# Patient Record
Sex: Female | Born: 1950
Health system: Southern US, Community
[De-identification: ages and names within clinical notes are randomized; demographics above are authoritative.]

## PROBLEM LIST (undated history)

## (undated) DIAGNOSIS — R053 Chronic cough: Secondary | ICD-10-CM

## (undated) DIAGNOSIS — F419 Anxiety disorder, unspecified: Secondary | ICD-10-CM

## (undated) DIAGNOSIS — IMO0001 Reserved for inherently not codable concepts without codable children: Secondary | ICD-10-CM

## (undated) DIAGNOSIS — R609 Edema, unspecified: Secondary | ICD-10-CM

## (undated) DIAGNOSIS — R260 Ataxic gait: Secondary | ICD-10-CM

## (undated) DIAGNOSIS — R0609 Other forms of dyspnea: Secondary | ICD-10-CM

## (undated) DIAGNOSIS — K219 Gastro-esophageal reflux disease without esophagitis: Secondary | ICD-10-CM

## (undated) DIAGNOSIS — K551 Chronic vascular disorders of intestine: Secondary | ICD-10-CM

## (undated) DIAGNOSIS — I480 Paroxysmal atrial fibrillation: Secondary | ICD-10-CM

## (undated) DIAGNOSIS — M5030 Other cervical disc degeneration, unspecified cervical region: Secondary | ICD-10-CM

## (undated) DIAGNOSIS — I70219 Atherosclerosis of native arteries of extremities with intermittent claudication, unspecified extremity: Secondary | ICD-10-CM

## (undated) DIAGNOSIS — J449 Chronic obstructive pulmonary disease, unspecified: Secondary | ICD-10-CM

## (undated) DIAGNOSIS — I639 Cerebral infarction, unspecified: Secondary | ICD-10-CM

## (undated) DIAGNOSIS — I2510 Atherosclerotic heart disease of native coronary artery without angina pectoris: Secondary | ICD-10-CM

## (undated) DIAGNOSIS — I10 Essential (primary) hypertension: Secondary | ICD-10-CM

## (undated) DIAGNOSIS — I700 Atherosclerosis of aorta: Secondary | ICD-10-CM

## (undated) DIAGNOSIS — G4733 Obstructive sleep apnea (adult) (pediatric): Secondary | ICD-10-CM

## (undated) DIAGNOSIS — R600 Localized edema: Secondary | ICD-10-CM

## (undated) DIAGNOSIS — G4730 Sleep apnea, unspecified: Secondary | ICD-10-CM

## (undated) DIAGNOSIS — R05 Cough: Secondary | ICD-10-CM

## (undated) DIAGNOSIS — E785 Hyperlipidemia, unspecified: Secondary | ICD-10-CM

## (undated) DIAGNOSIS — R059 Cough, unspecified: Secondary | ICD-10-CM

## (undated) DIAGNOSIS — E119 Type 2 diabetes mellitus without complications: Secondary | ICD-10-CM

## (undated) DIAGNOSIS — M1990 Unspecified osteoarthritis, unspecified site: Secondary | ICD-10-CM

## (undated) DIAGNOSIS — G629 Polyneuropathy, unspecified: Secondary | ICD-10-CM

## (undated) DIAGNOSIS — J45909 Unspecified asthma, uncomplicated: Secondary | ICD-10-CM

## (undated) DIAGNOSIS — Z9842 Cataract extraction status, left eye: Secondary | ICD-10-CM

## (undated) DIAGNOSIS — E559 Vitamin D deficiency, unspecified: Secondary | ICD-10-CM

## (undated) DIAGNOSIS — R062 Wheezing: Secondary | ICD-10-CM

## (undated) DIAGNOSIS — R29898 Other symptoms and signs involving the musculoskeletal system: Secondary | ICD-10-CM

## (undated) DIAGNOSIS — Z7902 Long term (current) use of antithrombotics/antiplatelets: Secondary | ICD-10-CM

## (undated) HISTORY — PX: INCONTINENCE SURGERY: SHX676

## (undated) HISTORY — DX: Sleep apnea, unspecified: G47.30

## (undated) HISTORY — PX: ELBOW SURGERY: SHX618

## (undated) HISTORY — DX: Essential (primary) hypertension: I10

## (undated) HISTORY — PX: ABDOMINAL HYSTERECTOMY: SHX81

## (undated) HISTORY — PX: TONSILLECTOMY: SUR1361

## (undated) HISTORY — DX: Hyperlipidemia, unspecified: E78.5

## (undated) HISTORY — PX: CARPAL TUNNEL RELEASE: SHX101

## (undated) HISTORY — DX: Cerebral infarction, unspecified: I63.9

## (undated) HISTORY — PX: ANTERIOR FUSION CERVICAL SPINE: SUR626

---

## 1998-12-30 ENCOUNTER — Encounter: Payer: Self-pay | Admitting: Neurosurgery

## 1998-12-30 ENCOUNTER — Ambulatory Visit (HOSPITAL_COMMUNITY): Admission: RE | Admit: 1998-12-30 | Discharge: 1998-12-30 | Payer: Self-pay | Admitting: Neurosurgery

## 1999-01-04 ENCOUNTER — Ambulatory Visit: Admission: RE | Admit: 1999-01-04 | Discharge: 1999-01-04 | Payer: Self-pay | Admitting: Anesthesiology

## 1999-01-31 ENCOUNTER — Encounter: Payer: Self-pay | Admitting: Neurosurgery

## 1999-02-02 ENCOUNTER — Observation Stay (HOSPITAL_COMMUNITY): Admission: RE | Admit: 1999-02-02 | Discharge: 1999-02-02 | Payer: Self-pay | Admitting: Neurosurgery

## 1999-02-02 ENCOUNTER — Encounter: Payer: Self-pay | Admitting: Neurosurgery

## 1999-03-03 ENCOUNTER — Encounter: Payer: Self-pay | Admitting: Neurosurgery

## 1999-03-03 ENCOUNTER — Ambulatory Visit (HOSPITAL_COMMUNITY): Admission: RE | Admit: 1999-03-03 | Discharge: 1999-03-03 | Payer: Self-pay | Admitting: Neurosurgery

## 1999-03-27 ENCOUNTER — Encounter: Payer: Self-pay | Admitting: Neurosurgery

## 1999-03-27 ENCOUNTER — Ambulatory Visit (HOSPITAL_COMMUNITY): Admission: RE | Admit: 1999-03-27 | Discharge: 1999-03-27 | Payer: Self-pay | Admitting: Neurosurgery

## 2006-05-22 ENCOUNTER — Ambulatory Visit: Payer: Self-pay | Admitting: Orthopedic Surgery

## 2007-11-06 LAB — HM COLONOSCOPY: HM Colonoscopy: NORMAL

## 2008-06-01 ENCOUNTER — Ambulatory Visit: Payer: Self-pay | Admitting: Gastroenterology

## 2008-07-16 ENCOUNTER — Ambulatory Visit: Payer: Self-pay | Admitting: Unknown Physician Specialty

## 2009-10-25 ENCOUNTER — Ambulatory Visit: Payer: Self-pay | Admitting: Family Medicine

## 2010-02-03 DIAGNOSIS — I639 Cerebral infarction, unspecified: Secondary | ICD-10-CM

## 2010-02-03 HISTORY — DX: Cerebral infarction, unspecified: I63.9

## 2010-02-08 ENCOUNTER — Inpatient Hospital Stay: Payer: Self-pay | Admitting: Internal Medicine

## 2010-02-09 DIAGNOSIS — I6381 Other cerebral infarction due to occlusion or stenosis of small artery: Secondary | ICD-10-CM

## 2010-02-09 HISTORY — DX: Other cerebral infarction due to occlusion or stenosis of small artery: I63.81

## 2011-05-31 ENCOUNTER — Ambulatory Visit: Payer: Self-pay | Admitting: Podiatry

## 2011-06-19 ENCOUNTER — Encounter: Payer: Self-pay | Admitting: Internal Medicine

## 2011-11-08 ENCOUNTER — Ambulatory Visit: Payer: Self-pay | Admitting: Family Medicine

## 2015-06-20 ENCOUNTER — Ambulatory Visit: Payer: Self-pay | Admitting: Family

## 2015-11-06 DIAGNOSIS — Z9842 Cataract extraction status, left eye: Secondary | ICD-10-CM

## 2015-11-06 DIAGNOSIS — Z9841 Cataract extraction status, right eye: Secondary | ICD-10-CM

## 2015-11-06 HISTORY — DX: Cataract extraction status, right eye: Z98.42

## 2015-11-06 HISTORY — DX: Cataract extraction status, right eye: Z98.41

## 2015-11-07 ENCOUNTER — Emergency Department: Payer: BLUE CROSS/BLUE SHIELD

## 2015-11-07 ENCOUNTER — Inpatient Hospital Stay
Admission: EM | Admit: 2015-11-07 | Discharge: 2015-11-08 | DRG: 065 | Disposition: A | Discharge status: Home / Self Care | Payer: BLUE CROSS/BLUE SHIELD | Attending: Internal Medicine | Admitting: Internal Medicine

## 2015-11-07 ENCOUNTER — Inpatient Hospital Stay: Payer: BLUE CROSS/BLUE SHIELD

## 2015-11-07 ENCOUNTER — Encounter: Payer: Self-pay | Admitting: Emergency Medicine

## 2015-11-07 ENCOUNTER — Inpatient Hospital Stay (HOSPITAL_COMMUNITY)
Admit: 2015-11-07 | Discharge: 2015-11-07 | Disposition: A | Discharge status: Home / Self Care | Payer: BLUE CROSS/BLUE SHIELD | Attending: Internal Medicine | Admitting: Internal Medicine

## 2015-11-07 DIAGNOSIS — Z8249 Family history of ischemic heart disease and other diseases of the circulatory system: Secondary | ICD-10-CM

## 2015-11-07 DIAGNOSIS — Z8673 Personal history of transient ischemic attack (TIA), and cerebral infarction without residual deficits: Secondary | ICD-10-CM | POA: Diagnosis not present

## 2015-11-07 DIAGNOSIS — I10 Essential (primary) hypertension: Secondary | ICD-10-CM | POA: Diagnosis present

## 2015-11-07 DIAGNOSIS — E785 Hyperlipidemia, unspecified: Secondary | ICD-10-CM | POA: Diagnosis present

## 2015-11-07 DIAGNOSIS — I779 Disorder of arteries and arterioles, unspecified: Secondary | ICD-10-CM

## 2015-11-07 DIAGNOSIS — I639 Cerebral infarction, unspecified: Principal | ICD-10-CM | POA: Diagnosis present

## 2015-11-07 DIAGNOSIS — G4730 Sleep apnea, unspecified: Secondary | ICD-10-CM | POA: Diagnosis present

## 2015-11-07 DIAGNOSIS — Z9114 Patient's other noncompliance with medication regimen: Secondary | ICD-10-CM

## 2015-11-07 DIAGNOSIS — G8191 Hemiplegia, unspecified affecting right dominant side: Secondary | ICD-10-CM | POA: Diagnosis present

## 2015-11-07 DIAGNOSIS — I6330 Cerebral infarction due to thrombosis of unspecified cerebral artery: Secondary | ICD-10-CM

## 2015-11-07 DIAGNOSIS — F419 Anxiety disorder, unspecified: Secondary | ICD-10-CM | POA: Diagnosis present

## 2015-11-07 DIAGNOSIS — Z833 Family history of diabetes mellitus: Secondary | ICD-10-CM | POA: Diagnosis not present

## 2015-11-07 DIAGNOSIS — F17210 Nicotine dependence, cigarettes, uncomplicated: Secondary | ICD-10-CM | POA: Diagnosis present

## 2015-11-07 DIAGNOSIS — E119 Type 2 diabetes mellitus without complications: Secondary | ICD-10-CM | POA: Diagnosis present

## 2015-11-07 DIAGNOSIS — Z716 Tobacco abuse counseling: Secondary | ICD-10-CM

## 2015-11-07 DIAGNOSIS — I6350 Cerebral infarction due to unspecified occlusion or stenosis of unspecified cerebral artery: Secondary | ICD-10-CM | POA: Diagnosis not present

## 2015-11-07 DIAGNOSIS — R531 Weakness: Secondary | ICD-10-CM

## 2015-11-07 HISTORY — DX: Disorder of arteries and arterioles, unspecified: I77.9

## 2015-11-07 HISTORY — DX: Cerebral infarction, unspecified: I63.9

## 2015-11-07 LAB — APTT: APTT: 26 s (ref 24–36)

## 2015-11-07 LAB — CBC
HEMATOCRIT: 40.9 % (ref 35.0–47.0)
HEMOGLOBIN: 14 g/dL (ref 12.0–16.0)
MCH: 31.4 pg (ref 26.0–34.0)
MCHC: 34.4 g/dL (ref 32.0–36.0)
MCV: 91.2 fL (ref 80.0–100.0)
Platelets: 232 10*3/uL (ref 150–440)
RBC: 4.48 MIL/uL (ref 3.80–5.20)
RDW: 14.7 % — ABNORMAL HIGH (ref 11.5–14.5)
WBC: 10.1 10*3/uL (ref 3.6–11.0)

## 2015-11-07 LAB — DIFFERENTIAL
BASOS ABS: 0.1 10*3/uL (ref 0–0.1)
Basophils Relative: 1 %
EOS ABS: 0.1 10*3/uL (ref 0–0.7)
Eosinophils Relative: 1 %
LYMPHS ABS: 3 10*3/uL (ref 1.0–3.6)
LYMPHS PCT: 30 %
MONOS PCT: 6 %
Monocytes Absolute: 0.6 10*3/uL (ref 0.2–0.9)
NEUTROS ABS: 6.2 10*3/uL (ref 1.4–6.5)
NEUTROS PCT: 62 %

## 2015-11-07 LAB — COMPREHENSIVE METABOLIC PANEL
ALBUMIN: 4 g/dL (ref 3.5–5.0)
ALK PHOS: 118 U/L (ref 38–126)
ALT: 15 U/L (ref 14–54)
AST: 15 U/L (ref 15–41)
Anion gap: 8 (ref 5–15)
BILIRUBIN TOTAL: 0.5 mg/dL (ref 0.3–1.2)
BUN: 11 mg/dL (ref 6–20)
CALCIUM: 9.8 mg/dL (ref 8.9–10.3)
CO2: 28 mmol/L (ref 22–32)
CREATININE: 0.73 mg/dL (ref 0.44–1.00)
Chloride: 102 mmol/L (ref 101–111)
GFR calc Af Amer: >60 mL/min (ref >60)
GLUCOSE: 142 mg/dL — AB (ref 65–99)
Potassium: 4.1 mmol/L (ref 3.5–5.1)
Sodium: 138 mmol/L (ref 135–145)
TOTAL PROTEIN: 7.6 g/dL (ref 6.5–8.1)

## 2015-11-07 LAB — TROPONIN I: TROPONIN I: 0.03 ng/mL (ref <0.031)

## 2015-11-07 LAB — GLUCOSE, CAPILLARY
GLUCOSE-CAPILLARY: 134 mg/dL — AB (ref 65–99)
Glucose-Capillary: 160 mg/dL — ABNORMAL HIGH (ref 65–99)
Glucose-Capillary: 139 mg/dL — ABNORMAL HIGH (ref 65–99)

## 2015-11-07 LAB — HEMOGLOBIN A1C: Hgb A1c MFr Bld: 7.3 % — ABNORMAL HIGH (ref 4.0–6.0)

## 2015-11-07 LAB — PROTIME-INR
INR: 0.89
Prothrombin Time: 12.3 seconds (ref 11.4–15.0)

## 2015-11-07 MED ORDER — ACETAMINOPHEN 325 MG PO TABS: 650.0000 mg | ORAL_TABLET | Freq: Four times a day (QID) | ORAL | Status: DC | PRN

## 2015-11-07 MED ORDER — VITAMIN B-12 1000 MCG PO TABS
1000.0000 ug | ORAL_TABLET | Freq: Every day | ORAL | Status: DC
  Administered 2015-11-08: 1000 ug via ORAL
  Filled 2015-11-07: qty 1

## 2015-11-07 MED ORDER — ENOXAPARIN SODIUM 40 MG/0.4ML ~~LOC~~ SOLN
40.0000 mg | SUBCUTANEOUS | Status: DC
  Administered 2015-11-07: 40 mg via SUBCUTANEOUS
  Filled 2015-11-07: qty 0.4

## 2015-11-07 MED ORDER — ACETAMINOPHEN 650 MG RE SUPP
650.0000 mg | Freq: Four times a day (QID) | RECTAL | Status: DC | PRN
Start: 2015-11-07 — End: 2015-11-08

## 2015-11-07 MED ORDER — AMITRIPTYLINE HCL 50 MG PO TABS
150.0000 mg | ORAL_TABLET | Freq: Every day | ORAL | Status: DC
  Administered 2015-11-07: 21:00:00 150 mg via ORAL
  Filled 2015-11-07: qty 3

## 2015-11-07 MED ORDER — INSULIN ASPART 100 UNIT/ML ~~LOC~~ SOLN: 0.0000 [IU] | Freq: Every day | SUBCUTANEOUS | Status: DC

## 2015-11-07 MED ORDER — ATORVASTATIN CALCIUM 20 MG PO TABS
40.0000 mg | ORAL_TABLET | Freq: Every day | ORAL | Status: DC
  Administered 2015-11-07: 18:00:00 40 mg via ORAL
  Filled 2015-11-07: qty 2

## 2015-11-07 MED ORDER — ALBUTEROL SULFATE (2.5 MG/3ML) 0.083% IN NEBU: 2.5000 mg | INHALATION_SOLUTION | Freq: Four times a day (QID) | RESPIRATORY_TRACT | Status: DC | PRN

## 2015-11-07 MED ORDER — NICOTINE 21 MG/24HR TD PT24
21.0000 mg | MEDICATED_PATCH | Freq: Every day | TRANSDERMAL | Status: DC
  Administered 2015-11-07 – 2015-11-08 (×2): 21 mg via TRANSDERMAL
  Filled 2015-11-07 (×2): qty 1

## 2015-11-07 MED ORDER — ASPIRIN EC 81 MG PO TBEC
81.0000 mg | DELAYED_RELEASE_TABLET | Freq: Every day | ORAL | Status: DC
  Administered 2015-11-08: 09:00:00 81 mg via ORAL
  Filled 2015-11-07: qty 1

## 2015-11-07 MED ORDER — SODIUM CHLORIDE 0.9 % IJ SOLN
3.0000 mL | Freq: Two times a day (BID) | INTRAMUSCULAR | Status: DC
  Administered 2015-11-07 – 2015-11-08 (×3): 3 mL via INTRAVENOUS

## 2015-11-07 MED ORDER — INSULIN ASPART 100 UNIT/ML ~~LOC~~ SOLN
0.0000 [IU] | Freq: Three times a day (TID) | SUBCUTANEOUS | Status: DC
  Administered 2015-11-07: 2 [IU] via SUBCUTANEOUS
  Administered 2015-11-07: 1 [IU] via SUBCUTANEOUS
  Filled 2015-11-07: qty 2
  Filled 2015-11-07: qty 1

## 2015-11-07 MED ORDER — ASPIRIN 81 MG PO CHEW
324.0000 mg | CHEWABLE_TABLET | Freq: Once | ORAL | Status: AC
  Administered 2015-11-07: 324 mg via ORAL
  Filled 2015-11-07: qty 4

## 2015-11-07 MED ORDER — ALBUTEROL SULFATE HFA 108 (90 BASE) MCG/ACT IN AERS: 2.0000 | INHALATION_SPRAY | Freq: Four times a day (QID) | RESPIRATORY_TRACT | Status: DC | PRN

## 2015-11-07 NOTE — Plan of Care (Signed)
Pt admitted for stroke - positive for stroke per MRI.  Has had all tests -U/S, ECHO.  Has some right side deficit - LE.  Husband is at bedside and is helping her to BR.  Pt doesn't have PCP and has stopped taking BP medications b/c of it. Strongly encouraged pt to quit smoking and educated that she is at high risk for stroke b/c of previous stroke hx, HTN and DM and active smoker. She is agreeable to quit smoking. Need to talk to Care Mgr to see if she can get help w/meds through [facility].

## 2015-11-07 NOTE — ED Notes (Signed)
Pt to US.

## 2015-11-07 NOTE — ED Notes (Signed)
Pt to CT

## 2015-11-07 NOTE — ED Notes (Signed)
Admitting MD at bedside.

## 2015-11-07 NOTE — ED Notes (Signed)
MD to bedside.

## 2015-11-07 NOTE — Progress Notes (Signed)
PT Cancellation Note  Patient Details Name: KATHA ROZARIO MRN: [id number] DOB: 15-Aug-1951   Cancelled Treatment:    Reason Eval/Treat Not Completed: Patient at procedure or test/unavailable (Echo being performed (in pt's room).)  D/t pt not available at this time, will re-attempt PT eval at a later date/time.   Raquel Sarna Abimelec Grochowski 11/07/2015, 2:53 PM Leitha Bleak, [place]

## 2015-11-07 NOTE — ED Provider Notes (Signed)
[facility] Emergency Department Provider Note  REMINDER - THIS NOTE IS NOT A FINAL MEDICAL RECORD UNTIL IT IS SIGNED. UNTIL THEN, THE CONTENT BELOW MAY REFLECT INFORMATION FROM A DOCUMENTATION TEMPLATE, NOT THE ACTUAL PATIENT VISIT. ____________________________________________  Time seen: Approximately 9:48 AM  I have reviewed the triage vital signs and the nursing notes.   HISTORY  Chief Complaint Extremity Weakness    HPI Autumn Arnold is a 65 y.o. female she diabetes, hypertension, previous stroke versus TIA, and active smoker.  Patient reports last evening sometime about 9 PM she notes she is having a little trouble walking, and felt like she was a little weak on the right leg. Then she woke up at about 2 AM and noticed that she was definitely weak in the right arm, right leg, and felt tingly over the right arm and face. This morning she notified family who brought her to the ER for further evaluation. She denies any pain. She does have a previous history of a TIA. She is a smoker.  No headache.   Past Medical History  Diagnosis Date  . Diabetes mellitus 2008  . Hyperlipidemia   . Hypertension   . Stroke [facility]) 02/2010    headache, left arm numbness  . Sleep apnea     CPAP at [facility], Dr. Humphrey Rolls    Patient Active Problem List   Diagnosis Date Noted  . CVA (cerebral infarction) 11/07/2015    Past Surgical History  Procedure Laterality Date  . Anterior fusion cervical spine    . Elbow surgery      Tendonitis  . Carpal tunnel release      Bilateral  . Incontinence surgery    . Abdominal hysterectomy    . Tonsillectomy      Current Outpatient Rx  Name  Route  Sig  Dispense  Refill  . albuterol (PROAIR HFA) 108 (90 Base) MCG/ACT inhaler   Inhalation   Inhale 2 puffs into the lungs as needed.         Marland Kitchen amitriptyline (ELAVIL) 150 MG tablet   Oral   Take 150 mg by mouth daily.           . vitamin B-12 (CYANOCOBALAMIN) 1000 MCG tablet    Oral   Take 1,000 mcg by mouth daily.             Allergies Review of patient's allergies indicates no known allergies.  History reviewed. No pertinent family history.  Social History Social History  Substance Use Topics  . Smoking status: Current Every Day Smoker -- 0.50 packs/day  . Smokeless tobacco: None  . Alcohol Use: Yes    Review of Systems Constitutional: No fever/chills Eyes: No visual changes. ENT: No sore throat. Cardiovascular: Denies chest pain. Respiratory: Denies shortness of breath. Gastrointestinal: No abdominal pain.  No nausea, no vomiting.  No diarrhea.  No constipation. Genitourinary: Negative for dysuria. Musculoskeletal: Negative for back pain. Skin: Negative for rash. Neurological: Negative for headaches. Denies trouble speaking, the family said that she seemed like she is having a little trouble with words.  10-point ROS otherwise negative.  ____________________________________________   PHYSICAL EXAM:  VITAL SIGNS: ED Triage Vitals  Enc Vitals Group     BP --      Pulse --      Resp --      Temp --      Temp src --      SpO2 --      Weight --  Height --      Head Cir --      Peak Flow --      Pain Score 11/07/15 0927 0     Pain Loc --      Pain Edu? --      Excl. in [place]? --    Constitutional: Alert and oriented. Well appearing and in no acute distress. Eyes: Conjunctivae are normal. PERRL. EOMI. Head: Atraumatic. Nose: No congestion/rhinnorhea. Mouth/Throat: Mucous membranes are moist.  Oropharynx non-erythematous. Neck: No stridor.   Cardiovascular: Normal rate, regular rhythm. Grossly normal heart sounds.  Good peripheral circulation. Respiratory: Normal respiratory effort.  No retractions. Lungs CTAB. Gastrointestinal: Soft and nontender. No distention. No abdominal bruits. No CVA tenderness. Musculoskeletal: No lower extremity tenderness nor edema.  No joint effusions. Neurologic:    NIH score equals 6, performed  by me at bedside. The patient has mild right pronator drift.  The patient has normal cranial nerve exam with exception of mild loss of sensation over the right face. Extraocular movements are normal. Visual fields are normal. Patient has 5 out of 5 strength in all extremities except for 4 right upper extremity and approximately 3-4 in the right lower. There is no numbness or gross, acute sensory abnormality in the extremities on the left though she does have moderate loss of sensation over the right arm and right leg. There is some very mild word finding.  No aphasia. Minimal right upper extremity ataxia. Normal left. Patient speaking in full and clear sentences.   Skin:  Skin is warm, dry and intact. No rash noted. Psychiatric: Mood and affect are normal. Speech and behavior are normal.  ____________________________________________   LABS (all labs ordered are listed, but only abnormal results are displayed)  Labs Reviewed  CBC - Abnormal; Notable for the following:    RDW 14.7 (*)    All other components within normal limits  COMPREHENSIVE METABOLIC PANEL - Abnormal; Notable for the following:    Glucose, Bld 142 (*)    All other components within normal limits  PROTIME-INR  APTT  DIFFERENTIAL  TROPONIN I  CBC  CREATININE, SERUM  HEMOGLOBIN A1C  CBG MONITORING, ED   ____________________________________________  EKG  Reviewed and interpreted by me at 9:36 AM Heart rate 110 PR 140 QRS 100 QTc 490 Sinus tachycardia, no evidence of acute ischemic abnormality, minimal prolonged QT ____________________________________________  RADIOLOGY  CT Head Wo Contrast (Final result) Result time: 11/07/15 10:11:00   Final result by Rad Results In Interface (11/07/15 10:11:00)   Narrative:   CLINICAL DATA: 65 year old female with history of right-sided arm and leg weakness for the past 14 hours.  EXAM: CT HEAD WITHOUT CONTRAST  TECHNIQUE: Contiguous axial images were  obtained from the base of the skull through the vertex without intravenous contrast.  COMPARISON: MRI of the brain 02/09/2010. Head CT 02/08/2010.  FINDINGS: Patchy areas of decreased attenuation are noted throughout the deep and periventricular white matter of the cerebral hemispheres bilaterally, compatible with mild chronic microvascular ischemic disease. In the posterior left frontal periventricular white matter there is a rather ill-defined area of low attenuation (image 14 of series 2), which could represent age-indeterminate (i.e. potentially subacute) ischemia as this is new compared to prior study from 02/08/2010 (although this may alternatively be chronic). Well-defined focus of low attenuation in the right thalamus, compatible with old lacunar infarct. No other acute intracranial abnormalities. Specifically, no evidence of acute intracranial hemorrhage, no mass, mass effect, hydrocephalus or abnormal intra or extra-axial  fluid collections. Visualized paranasal sinuses and mastoids are well pneumatized. No acute displaced skull fractures are identified.  IMPRESSION: 1. Potential area of age-indeterminate ischemia in the periventricular white matter of the left posterior frontal region, which would correspond to the patient's acute symptomatology. The possibility of a subacute infarct in this region is suspected, and further evaluation with brain MRI with and without IV gadolinium is recommended at this time. 2. Old lacunar infarct of the right thalamus. 3. Chronic microvascular ischemic changes in cerebral white matter, as above. These results were called by telephone at the time of interpretation on 11/07/2015 at 10:07 am to Dr. Delman Kitten, who verbally acknowledged these results.   Electronically Signed By: Vinnie Langton M.D. On: 11/07/2015 10:11       ____________________________________________   PROCEDURES  Procedure(s) performed:  None  Critical Care performed: Yes, see critical care note(s)  CRITICAL CARE Performed by: Delman Kitten   Total critical care time: 35 minutes  Critical care time was exclusive of separately billable procedures and treating other patients.  Critical care was necessary to treat or prevent imminent or life-threatening deterioration.  Critical care was time spent personally by me on the following activities: development of treatment plan with patient and/or surrogate as well as nursing, discussions with consultants, evaluation of patient's response to treatment, examination of patient, obtaining history from patient or surrogate, ordering and performing treatments and interventions, ordering and review of laboratory studies, ordering and review of radiographic studies, pulse oximetry and re-evaluation of patient's condition.  Patient resents emergency room with acute neurologic deficit. Patient required immediate evaluation for possible acute stroke. Unfortunately, patient is not a candidate for TPA after evaluation due to a time of onset of approximately 9 PM last night, with clear evidence of symptoms at 2 AM this morning while outside the TPA window. ____________________________________________   INITIAL IMPRESSION / [place] / ED COURSE  Pertinent labs & imaging results that were available during my care of the patient were reviewed by me and considered in my medical decision making (see chart for details).  Acute right-sided deficits. Painless. Based on clinical exam consistent with acute stroke.  ASA given.  ----------------------------------------- 10:10 AM on 11/07/2015 -----------------------------------------  Discussed with radiology, CT appears to be consistent with a probable acute ischemic stroke. We will admit the patient for ongoing care and evaluation. ____________________________________________   FINAL CLINICAL IMPRESSION(S) / ED DIAGNOSES  Final  diagnoses:  Acute ischemic stroke (HCC)      Delman Kitten, MD 11/07/15 1103

## 2015-11-07 NOTE — Progress Notes (Signed)
Patient refuses bed alarm. Husband at bedside. Pt calls for assistance out of bed. Pt and husband understand how to call for assistance.

## 2015-11-07 NOTE — Progress Notes (Signed)
*  PRELIMINARY RESULTS* Echocardiogram 2D Echocardiogram has been performed.  Autumn Arnold 11/07/2015, 3:14 PM

## 2015-11-07 NOTE — H&P (Signed)
[place] at [place] NAME: Autumn Arnold    MR#:  [id number]  DATE OF BIRTH:  27-Mar-1951  DATE OF ADMISSION:  11/07/2015  PRIMARY CARE PHYSICIAN: None  REQUESTING/REFERRING PHYSICIAN: Dr. Jacqualine Code  CHIEF COMPLAINT:   Chief Complaint  Patient presents with  . Extremity Weakness    HISTORY OF PRESENT ILLNESS:  Autumn Arnold  is a 65 y.o. female with a known history of previous stroke, diabetes and hypertension. She presents to the ER with right sided numbness and tingling, right sided weakness and difficulty walking. She states that her legs give out when she walks. She's been stumbling around for a few days. The numbness on the right side it started at 2 AM this morning. In the ER, a CT scan of the head showed a subacute stroke in the left posterior frontal area. Hospitalist services were contacted for further evaluation  PAST MEDICAL HISTORY:   Past Medical History  Diagnosis Date  . Diabetes mellitus 2008  . Hyperlipidemia   . Hypertension   . Stroke [facility]) 02/2010    headache, left arm numbness  . Sleep apnea     CPAP at [facility], Dr. Humphrey Rolls    PAST SURGICAL HISTORY:   Past Surgical History  Procedure Laterality Date  . Anterior fusion cervical spine    . Elbow surgery      Tendonitis  . Carpal tunnel release      Bilateral  . Incontinence surgery    . Abdominal hysterectomy    . Tonsillectomy    . Cesarean section      SOCIAL HISTORY:   Social History  Substance Use Topics  . Smoking status: Current Every Day Smoker -- 1.00 packs/day  . Smokeless tobacco: Not on file  . Alcohol Use: 4.2 oz/week    7 Cans of beer per week    FAMILY HISTORY:   Family History  Problem Relation Age of Onset  . CAD Mother   . Hypertension Mother   . Arthritis Mother   . Diabetes Father   . CAD Father     DRUG ALLERGIES:  No Known Allergies  REVIEW OF SYSTEMS:  CONSTITUTIONAL: No fever, positive for hot-type feeling. Positive  for right-sided weakness. Positive for weight gain. EYES: No blurred or double vision. Left eye blurry vision with cataract EARS, NOSE, AND THROAT: No tinnitus or ear pain. No sore throat. Positive for dry mouth RESPIRATORY: Positive for cough with greenish phlegm. No shortness of breath, wheezing or hemoptysis.  CARDIOVASCULAR: No chest pain, orthopnea, edema.  GASTROINTESTINAL: No nausea, vomiting, diarrhea. Occasional abdominal pain and occasional constipation. No blood in bowel movements. GENITOURINARY: No dysuria, hematuria.  ENDOCRINE: No polyuria, nocturia,  HEMATOLOGY: No anemia, easy bruising or bleeding SKIN: No rash or lesion. MUSCULOSKELETAL: Positive for joint pain in the knees and ankles NEUROLOGIC: No tingling, numbness, weakness.  PSYCHIATRY: Occasional anxiety.   MEDICATIONS AT HOME:   Prior to Admission medications   Medication Sig Start Date End Date Taking? Authorizing Provider  albuterol (PROAIR HFA) 108 (90 Base) MCG/ACT inhaler Inhale 2 puffs into the lungs as needed.   Yes Historical Provider, MD  amitriptyline (ELAVIL) 150 MG tablet Take 150 mg by mouth daily.     Yes Historical Provider, MD  vitamin B-12 (CYANOCOBALAMIN) 1000 MCG tablet Take 1,000 mcg by mouth daily.     Yes Historical Provider, MD      VITAL SIGNS:  Blood pressure 188/87, pulse 110, temperature 98.7 F (  37.1 C), temperature source Oral, resp. rate 20, weight 63.504 kg (140 lb), SpO2 97 %.  PHYSICAL EXAMINATION:  GENERAL:  65 y.o.-year-old patient lying in the bed with no acute distress.  EYES: Pupils equal, round, reactive to light and accommodation. No scleral icterus. Extraocular muscles intact.  HEENT: Head atraumatic, normocephalic. Oropharynx and nasopharynx clear.  NECK:  Supple, no jugular venous distention. No thyroid enlargement, no tenderness.  LUNGS: Normal breath sounds bilaterally, no wheezing, rales,rhonchi or crepitation. No use of accessory muscles of respiration.   CARDIOVASCULAR: S1, S2 normal. No murmurs, rubs, or gallops.  ABDOMEN: Soft, nontender, nondistended. Bowel sounds present. No organomegaly or mass.  EXTREMITIES: Trace edema, no cyanosis, or clubbing.  NEUROLOGIC: Cranial nerves II through XII are intact. Muscle strength 5/5 in upper extremities bilaterally. Muscle strength 4 out of 5 right lower extremity. 5 out of 5 left lower extremity. Patient with slowed incoordination of the right hand in rapid finger movements. Slightly impaired finger-nose with the right hand. Sensation as per patient decreased on the right side as compared to the left. Gait not checked. Reflexes 2+ bilateral lower extremity. PSYCHIATRIC: The patient is alert and oriented x 3.  SKIN: No rash, lesion, or ulcer.   LABORATORY PANEL:   CBC  Recent Labs Lab 11/07/15 0941  WBC 10.1  HGB 14.0  HCT 40.9  PLT 232   ------------------------------------------------------------------------------------------------------------------  Chemistries   Recent Labs Lab 11/07/15 0941  NA 138  K 4.1  CL 102  CO2 28  GLUCOSE 142*  BUN 11  CREATININE 0.73  CALCIUM 9.8  AST 15  ALT 15  ALKPHOS 118  BILITOT 0.5   ------------------------------------------------------------------------------------------------------------------  Cardiac Enzymes  Recent Labs Lab 11/07/15 0941  TROPONINI 0.03   ------------------------------------------------------------------------------------------------------------------  RADIOLOGY:  Ct Head Wo Contrast  11/07/2015  CLINICAL DATA:  65 year old female with history of right-sided arm and leg weakness for the past 14 hours. EXAM: CT HEAD WITHOUT CONTRAST TECHNIQUE: Contiguous axial images were obtained from the base of the skull through the vertex without intravenous contrast. COMPARISON:  MRI of the brain 02/09/2010.  Head CT 02/08/2010. FINDINGS: Patchy areas of decreased attenuation are noted throughout the deep and periventricular  white matter of the cerebral hemispheres bilaterally, compatible with mild chronic microvascular ischemic disease. In the posterior left frontal periventricular white matter there is a rather ill-defined area of low attenuation (image 14 of series 2), which could represent age-indeterminate (i.e. potentially subacute) ischemia as this is new compared to prior study from 02/08/2010 (although this may alternatively be chronic). Well-defined focus of low attenuation in the right thalamus, compatible with old lacunar infarct. No other acute intracranial abnormalities. Specifically, no evidence of acute intracranial hemorrhage, no mass, mass effect, hydrocephalus or abnormal intra or extra-axial fluid collections. Visualized paranasal sinuses and mastoids are well pneumatized. No acute displaced skull fractures are identified. IMPRESSION: 1. Potential area of age-indeterminate ischemia in the periventricular white matter of the left posterior frontal region, which would correspond to the patient's acute symptomatology. The possibility of a subacute infarct in this region is suspected, and further evaluation with brain MRI with and without IV gadolinium is recommended at this time. 2. Old lacunar infarct of the right thalamus. 3. Chronic microvascular ischemic changes in cerebral white matter, as above. These results were called by telephone at the time of interpretation on 11/07/2015 at 10:07 am to Dr. Delman Kitten, who verbally acknowledged these results. Electronically Signed   By: Vinnie Langton M.D.   On: 11/07/2015 10:11  EKG:   Sinus tachycardia 110 bpm, atrial premature contraction.  IMPRESSION AND PLAN:   1. Subacute stroke of the left posterior frontal area with right sided weakness and paresthesias. Since the patient is aspirin nave I will prescribe aspirin. I will also prescribe Lipitor 40 mg daily and check a lipid profile. I will obtain an MRI of the brain, carotid ultrasound and echocardiogram. I  will monitor on telemetry. Physical therapy and occupational therapy consultations. Also previous CVA seen on CT scan. 2. Accelerated hypertension- allow permissive hypertension at this point. Continue to monitor blood pressure. 3. Type 2 diabetes mellitus- check a hemoglobin A1c and place on sliding scale at this point. 4. Anxiety on amitriptyline at night  All the records are reviewed and case discussed with ED provider. Management plans discussed with the patient, family and they are in agreement.  CODE STATUS: Full code  TOTAL TIME TAKING CARE OF THIS PATIENT: 50 minutes.    Loletha Grayer M.D on 11/07/2015 at 11:43 AM  Between 7am to 6pm - Pager - [phone]  After 6pm call admission pager [facility]  Office  [phone]  CC: Primary care physician; none

## 2015-11-07 NOTE — ED Notes (Addendum)
Pt back from CT

## 2015-11-07 NOTE — ED Notes (Signed)
Pt to ed with c/o right sided weakness that started last night,  Pt reports noticeable weakness in right arm, leg and right sided facial droop. Pt alert and oriented at triage. Family with pt.

## 2015-11-08 LAB — BASIC METABOLIC PANEL
ANION GAP: 8 (ref 5–15)
BUN: 15 mg/dL (ref 6–20)
CO2: 25 mmol/L (ref 22–32)
Calcium: 9 mg/dL (ref 8.9–10.3)
Chloride: 104 mmol/L (ref 101–111)
Creatinine, Ser: 0.62 mg/dL (ref 0.44–1.00)
Glucose, Bld: 124 mg/dL — ABNORMAL HIGH (ref 65–99)
POTASSIUM: 4.1 mmol/L (ref 3.5–5.1)
SODIUM: 137 mmol/L (ref 135–145)

## 2015-11-08 LAB — CBC
HEMATOCRIT: 40.6 % (ref 35.0–47.0)
Hemoglobin: 13.5 g/dL (ref 12.0–16.0)
MCH: 30.6 pg (ref 26.0–34.0)
MCHC: 33.3 g/dL (ref 32.0–36.0)
MCV: 92.1 fL (ref 80.0–100.0)
Platelets: 215 10*3/uL (ref 150–440)
RBC: 4.41 MIL/uL (ref 3.80–5.20)
RDW: 14.5 % (ref 11.5–14.5)
WBC: 8.6 10*3/uL (ref 3.6–11.0)

## 2015-11-08 LAB — GLUCOSE, CAPILLARY
GLUCOSE-CAPILLARY: 112 mg/dL — AB (ref 65–99)
Glucose-Capillary: 115 mg/dL — ABNORMAL HIGH (ref 65–99)

## 2015-11-08 LAB — LIPID PANEL
CHOL/HDL RATIO: 8.5 ratio
Cholesterol: 305 mg/dL — ABNORMAL HIGH (ref 0–200)
HDL: 36 mg/dL — AB (ref >40)
LDL Cholesterol: 206 mg/dL — ABNORMAL HIGH (ref 0–99)
TRIGLYCERIDES: 314 mg/dL — AB (ref <150)
VLDL: 63 mg/dL — ABNORMAL HIGH (ref 0–40)

## 2015-11-08 MED ORDER — ASPIRIN 81 MG PO TBEC: 81.0000 mg | DELAYED_RELEASE_TABLET | Freq: Every day | ORAL | Status: DC

## 2015-11-08 MED ORDER — METOPROLOL TARTRATE 25 MG PO TABS: 25.0000 mg | ORAL_TABLET | Freq: Two times a day (BID) | ORAL | Status: DC

## 2015-11-08 MED ORDER — ATORVASTATIN CALCIUM 40 MG PO TABS: 40.0000 mg | ORAL_TABLET | Freq: Every day | ORAL | Status: DC

## 2015-11-08 MED ORDER — METOPROLOL TARTRATE 25 MG PO TABS
25.0000 mg | ORAL_TABLET | Freq: Two times a day (BID) | ORAL | Status: DC
  Administered 2015-11-08: 25 mg via ORAL
  Filled 2015-11-08: qty 1

## 2015-11-08 MED ORDER — METFORMIN HCL 500 MG PO TABS: 500.0000 mg | ORAL_TABLET | Freq: Two times a day (BID) | ORAL | Status: DC

## 2015-11-08 MED ORDER — NICOTINE 21 MG/24HR TD PT24: 21.0000 mg | MEDICATED_PATCH | Freq: Every day | TRANSDERMAL | Status: DC

## 2015-11-08 MED ORDER — SERTRALINE HCL 100 MG PO TABS: 100.0000 mg | ORAL_TABLET | Freq: Every day | ORAL | Status: DC

## 2015-11-08 NOTE — Evaluation (Signed)
Physical Therapy Evaluation Patient Details Name: Autumn Arnold MRN: [id number] DOB: Mar 04, 1951 Today's Date: 11/08/2015   History of Present Illness  Pt is a 65 y.o. female presenting to hospital with R sided numbness, tingling, weakness, and difficulty walking (legs give out when walking).  MRI shows acute nonhemorrhagic small infarct extends posterior superior L lenticular nucleus to posterior L corona radiate; also remote small infarct juntion R thalamus and posterior limb R internal capsule.; no intracranial hemorrhage.  PMH includes stroke (L arm numbness), DM, htn, ACDF, elbow surgery.  Clinical Impression  Prior to admission, pt was independent without AD (except husband assists her on step in/out of home).  Pt lives with her husband in 1 level home with step to enter.  Currently pt is independent with bed mobility, SBA with transfers, and CGA with ambulation around nursing loop using RW (pt unsteady without AD and balance/gait improved with RW).  Pt would benefit from skilled PT to address noted impairments and functional limitations.  Recommend pt discharge to home with SBA for functional mobility with use of RW and OP PT when medically appropriate.     Follow Up Recommendations Supervision for mobility/OOB;Outpatient PT    Equipment Recommendations   (pt owns RW)    Recommendations for Other Services       Precautions / Restrictions Precautions Precautions: Fall Restrictions Weight Bearing Restrictions: No      Mobility  Bed Mobility Overal bed mobility: Independent                Transfers Overall transfer level: Needs assistance Equipment used: None Transfers: Sit to/from Stand Sit to Stand: Supervision         General transfer comment: steady without loss of balance  Ambulation/Gait Ambulation/Gait assistance: Min guard;Min assist Ambulation Distance (Feet):  (60 feet no AD; 180 feet with RW) Assistive device: None;Rolling walker (2 wheeled)   Gait  velocity: decreased initially without AD; improved with RW   General Gait Details: pt unsteady with increased lateral sway and decreased step length without AD; improved steadiness with decreased lateral sway and increased step length noted with RW; pt appearing with increased "stiffness/rigid" quick movement with LE's in general  Stairs            Wheelchair Mobility    Modified Rankin (Stroke Patients Only)       Balance Overall balance assessment: Needs assistance Sitting-balance support: Bilateral upper extremity supported;Feet supported Sitting balance-Leahy Scale: Normal     Standing balance support: Bilateral upper extremity supported (on RW) Standing balance-Leahy Scale: Good                               Pertinent Vitals/Pain Pain Assessment: No/denies pain  See flowsheet for details.    Home Living Family/patient expects to be discharged to:: Private residence Living Arrangements: Spouse/significant other Available Help at Discharge: Family   Home Access: Stairs to enter Entrance Stairs-Rails: None Entrance Stairs-Number of Steps: 1 Home Layout: One level Home Equipment: Environmental consultant - 2 wheels;Cane - single point      Prior Function Level of Independence: Independent         Comments: Pt independent without AD; husband assists pt with step in/out of home.     Hand Dominance        Extremity/Trunk Assessment   Upper Extremity Assessment: RUE deficits/detail;LUE deficits/detail RUE Deficits / Details: ROM WFL; shoulder flexion 4/5; elbow flexion/extension 4+/5; mild decrease R hand  grip compared to L     LUE Deficits / Details: strength and ROM WFL   Lower Extremity Assessment: RLE deficits/detail;LLE deficits/detail RLE Deficits / Details: R hip flexion 4/5; knee flexion/extension 4/5; DF 4+/5; normal proprioception; decreased quality heel to shin coordination compared to L LE LLE Deficits / Details: strength and ROM WFL  Cervical  / Trunk Assessment: Normal  Communication   Communication: No difficulties  Cognition Arousal/Alertness: Awake/alert Behavior During Therapy: WFL for tasks assessed/performed Overall Cognitive Status: Within Functional Limits for tasks assessed                      General Comments   Nursing cleared pt for participation in physical therapy.  Pt agreeable to PT session.    Exercises  Gait training with RW.      Assessment/Plan    PT Assessment Patient needs continued PT services  PT Diagnosis Difficulty walking (R sided weakness)   PT Problem List Decreased strength;Decreased balance  PT Treatment Interventions DME instruction;Gait training;Stair training;Functional mobility training;Therapeutic activities;Therapeutic exercise;Balance training;Neuromuscular re-education;Patient/family education   PT Goals (Current goals can be found in the Care Plan section) Acute Rehab PT Goals Patient Stated Goal: to go home PT Goal Formulation: With patient Time For Goal Achievement: 11/22/15 Potential to Achieve Goals: Good    Frequency 7X/week   Barriers to discharge        Co-evaluation               End of Session Equipment Utilized During Treatment: Gait belt Activity Tolerance: Patient tolerated treatment well Patient left: in bed;with call bell/phone within reach (pt refusing bed alarm) Nurse Communication: Mobility status (pt refusing bed alarm)         Time: [id number] PT Time Calculation (min) (ACUTE ONLY): 25 min   Charges:   PT Evaluation $Initial PT Evaluation Tier I: 1 Procedure PT Treatments $Gait Training: 8-22 mins   PT G CodesLeitha Bleak 03-Dec-2015, 9:36 AM Leitha Bleak, [place]

## 2015-11-08 NOTE — Evaluation (Addendum)
Occupational Therapy Evaluation Patient Details Name: Autumn Arnold MRN: [id number] DOB: 08-16-1951 Today's Date: 11/08/2015    History of Present Illness Pt is a 65 y.o. female presenting to hospital with R sided numbness, tingling, weakness, and difficulty walking (legs give out when walking).  MRI shows acute nonhemorrhagic small infarct extends posterior superior L lenticular nucleus to posterior L corona radiate; also remote small infarct juntion R thalamus and posterior limb R internal capsule.; no intracranial hemorrhage.  PMH includes stroke (L arm numbness), DM, htn, ACDF, elbow surgery.   Clinical Impression   This patient is a 65 year old female who came to [facility] with the above history. She lives with her husband in a one story home with one steps to enter. She had been independent with ADL and functional mobility and works full time. She shows mild deficits in coordination, sensory, and mobility. She would benefit from Occupational Therapy for higher level activities of daily living and neuromuscular re-education.         Follow Up Recommendations   (Instructed patient if ataxia continues after a week to go to primary care physician and get script for OT.)    Equipment Recommendations       Recommendations for Other Services       Precautions / Restrictions Precautions Precautions: Fall Restrictions Weight Bearing Restrictions: No      Mobility Bed Mobility Overal bed mobility: Independent                Transfers Overall transfer level: Needs assistance Equipment used: None Transfers: Sit to/from Stand Sit to Stand: Supervision         General transfer comment: steady without loss of balance    Balance                              ADL                                         General ADL Comments: Patient had been independent with ADL and works full time. Patient demonstrates ability to dress  and toilet herself with supervision for safety.     Vision     Perception     Praxis      Pertinent Vitals/Pain Pain Assessment: No/denies pain     Hand Dominance Left   Extremity/Trunk Assessment Upper Extremity Assessment Upper Extremity Assessment:  (B UE WNL strength 5/5, grip 50 lbs bilaterally, mild diminished for light touch and sharp, equil for temp and stereognosis. 9 hole peg test left 47 seconds right 37 seconds. ) RUE Deficits / Details: ROM WFL; shoulder flexion 4/5; elbow flexion/extension 4+/5; mild decrease R hand grip compared to L LUE Deficits / Details: strength and ROM WFL      Cervical / Trunk Assessment Cervical / Trunk Assessment: Normal   Communication Communication Communication: No difficulties   Cognition Arousal/Alertness: Awake/alert Behavior During Therapy: WFL for tasks assessed/performed Overall Cognitive Status: Within Functional Limits for tasks assessed                     General Comments   Fine motor: Completed card flipping with fingers only with cues for technique.    Exercises       Shoulder Instructions      Home Living Family/patient expects to be discharged  to:: Private residence Living Arrangements: Spouse/significant other Available Help at Discharge: Family   Home Access: Stairs to enter Technical brewer of Steps: 1 Entrance Stairs-Rails: None Home Layout: One level               Home Equipment: Environmental consultant - 2 wheels;Cane - single point          Prior Functioning/Environment Level of Independence: Independent        Comments: Also works full time.    OT Diagnosis: Ataxia   OT Problem List: Decreased activity tolerance;Decreased safety awareness;Decreased coordination   OT Treatment/Interventions:      OT Goals(Current goals can be found in the care plan section) Acute Rehab OT Goals Patient Stated Goal: to go home OT Goal Formulation: With patient Time For Goal Achievement:  11/22/15 Potential to Achieve Goals: Good  OT Frequency:     Barriers to D/C:            Co-evaluation              End of Session Equipment Utilized During Treatment:  (stroke test kit.)  Activity Tolerance:   Patient left: in bed;with family/visitor present   Time: 1106-1130 OT Time Calculation (min): 24 min Charges:  OT General Charges $OT Visit: 1 Procedure OT Evaluation $OT Eval Low Complexity: 1 Procedure OT Treatments $Neuromuscular Re-education: 8-22 mins G-Codes:    Myrene Galas, MS/OTR/L  11/08/2015, 11:50 AM

## 2015-11-08 NOTE — Plan of Care (Signed)
Problem: Safety: Goal: Ability to remain free from injury will improve Outcome: Progressing High fall risk. Refuses bed alarm. Husband at bedside through the night, calls for assistance to go to bathroom. Safe environment provided.   Problem: Health Behavior/Discharge Planning: Goal: Ability to manage health-related needs will improve Outcome: Progressing Patient from home with husband who is very supportive.   Problem: Pain Managment: Goal: General experience of comfort will improve Outcome: Completed/Met Date Met:  11/08/15 No c/o pain. Resting comfortably through the night.  Problem: Physical Regulation: Goal: Ability to maintain clinical measurements within normal limits will improve Outcome: Progressing Neuro checks Q2H. NIH 3. Slight Right facial droop, right side tingling sensation.

## 2015-11-08 NOTE — Progress Notes (Signed)
Pt for discharge home. A/o. No resp distress.  P.t. Saw and ambulated in hall and pt  Did well.  Good strength in all 4 extremities. Pt to have out pt p.t.  Discharge instructions discussed with pt presc given and discussed. Education  Discussed re. Importance of taking  B/p med and  Stroke booklet given. Stroke early stages of recovery.pt verbalize understanding of discharge plans. Sl d/cd.

## 2015-11-09 NOTE — Discharge Summary (Signed)
[place] [facility] [place] NAME: Autumn Arnold    MR#:  [id number]  DATE OF BIRTH:  01/07/1951  DATE OF ADMISSION:  11/07/2015 ADMITTING PHYSICIAN: Loletha Grayer, MD  DATE OF DISCHARGE: 11/08/2015  1:36 PM  PRIMARY CARE PHYSICIAN: No primary care provider on file.    ADMISSION DIAGNOSIS:  Acute ischemic stroke (HCC) [I63.9] Weakness of right side of body [M62.89]  DISCHARGE DIAGNOSIS:  Active Problems:   CVA (cerebral infarction)   SECONDARY DIAGNOSIS:   Past Medical History  Diagnosis Date  . Diabetes mellitus 2008  . Hyperlipidemia   . Hypertension   . Stroke [facility]) 02/2010    headache, left arm numbness  . Sleep apnea     CPAP at [facility], Dr. Karyl Kinnier COURSE:   65 year old female with past medical history significant for diabetes, hypertension, hyperlipidemia and ongoing smoking, noncompliant with medications presents to the hospital secondary to right-sided weakness and also numbness.  #1 acute CVA-symptoms are much improved. -MRI of the brain showing acute nonhemorrhagic infarct in posterior left lenticular nucleus. And also remote infarct in right thalamus and posterior limb of internal capsule. - ECHO normal, no source of CVA identified - carotid dopplers with no significant obstruction - started on asa, statin - LDL >200 - noncompliant with meds as outpatient - smoking cessation advised  #2 Hypertension-on metoprolol  #3 diabetes mellitus-on metformin  #4 tobacco use disorder-strongly counseled. Nicotine patch at discharge.  Patient will be discharged home today. Physical therapy saw the patient and she has done very well and they have recommended outpatient physical therapy.   DISCHARGE CONDITIONS:   Stable  CONSULTS OBTAINED:  Treatment Team:  Loletha Grayer, MD  DRUG ALLERGIES:  No Known Allergies  DISCHARGE MEDICATIONS:   Discharge Medication List as of 11/08/2015 12:56 PM    START taking  these medications   Details  aspirin EC 81 MG EC tablet Take 1 tablet (81 mg total) by mouth daily., Starting 11/08/2015, Until Discontinued, Print    atorvastatin (LIPITOR) 40 MG tablet Take 1 tablet (40 mg total) by mouth daily at 6 PM., Starting 11/08/2015, Until Discontinued, Print    metoprolol tartrate (LOPRESSOR) 25 MG tablet Take 1 tablet (25 mg total) by mouth 2 (two) times daily., Starting 11/08/2015, Until Discontinued, Print    nicotine (NICODERM CQ - DOSED IN MG/24 HOURS) 21 mg/24hr patch Place 1 patch (21 mg total) onto the skin daily., Starting 11/08/2015, Until Discontinued, Print      CONTINUE these medications which have NOT CHANGED   Details  albuterol (PROAIR HFA) 108 (90 Base) MCG/ACT inhaler Inhale 2 puffs into the lungs as needed., Until Discontinued, Historical Med    amitriptyline (ELAVIL) 150 MG tablet Take 150 mg by mouth daily.  , Until Discontinued, Historical Med    vitamin B-12 (CYANOCOBALAMIN) 1000 MCG tablet Take 1,000 mcg by mouth daily.  , Until Discontinued, Historical Med         DISCHARGE INSTRUCTIONS:   1. PCP follow-up in 1-2 weeks 2. Smoking cessation  If you experience worsening of your admission symptoms, develop shortness of breath, life threatening emergency, suicidal or homicidal thoughts you must seek medical attention immediately by calling 911 or calling your MD immediately  if symptoms less severe.  You Must read complete instructions/literature along with all the possible adverse reactions/side effects for all the Medicines you take and that have been prescribed to you. Take any new Medicines after you have  completely understood and accept all the possible adverse reactions/side effects.   Please note  You were cared for by a hospitalist during your hospital stay. If you have any questions about your discharge medications or the care you received while you were in the hospital after you are discharged, you can call the unit and asked to  speak with the hospitalist on call if the hospitalist that took care of you is not available. Once you are discharged, your primary care physician will handle any further medical issues. Please note that NO REFILLS for any discharge medications will be authorized once you are discharged, as it is imperative that you return to your primary care physician (or establish a relationship with a primary care physician if you do not have one) for your aftercare needs so that they can reassess your need for medications and monitor your lab values.    Today   CHIEF COMPLAINT:   Chief Complaint  Patient presents with  . Extremity Weakness    VITAL SIGNS:  Blood pressure 139/57, pulse 111, temperature 97.8 F (36.6 C), temperature source Oral, resp. rate 20, height 5' (1.524 m), weight 72.712 kg (160 lb 4.8 oz), SpO2 98 %.  I/O:  No intake or output data in the 24 hours ending 11/09/15 1653  PHYSICAL EXAMINATION:   Physical Exam  GENERAL:  65 y.o.-year-old patient lying in the bed with no acute distress.  EYES: Pupils equal, round, reactive to light and accommodation. No scleral icterus. Extraocular muscles intact.  HEENT: Head atraumatic, normocephalic. Oropharynx and nasopharynx clear.  NECK:  Supple, no jugular venous distention. No thyroid enlargement, no tenderness.  LUNGS: Normal breath sounds bilaterally, no wheezing, rales,rhonchi or crepitation. No use of accessory muscles of respiration.  CARDIOVASCULAR: S1, S2 normal. No murmurs, rubs, or gallops.  ABDOMEN: Soft, non-tender, non-distended. Bowel sounds present. No organomegaly or mass.  EXTREMITIES: No pedal edema, cyanosis, or clubbing.  NEUROLOGIC: Cranial nerves II through XII are intact. Muscle strength 5/5 in all extremities. Sensation intact except some tingling in the right arm. Gait not checked.  PSYCHIATRIC: The patient is alert and oriented x 3.  SKIN: No obvious rash, lesion, or ulcer.   DATA REVIEW:   CBC  Recent  Labs Lab 11/08/15 0419  WBC 8.6  HGB 13.5  HCT 40.6  PLT 215    Chemistries   Recent Labs Lab 11/07/15 0941 11/08/15 0419  NA 138 137  K 4.1 4.1  CL 102 104  CO2 28 25  GLUCOSE 142* 124*  BUN 11 15  CREATININE 0.73 0.62  CALCIUM 9.8 9.0  AST 15  --   ALT 15  --   ALKPHOS 118  --   BILITOT 0.5  --     Cardiac Enzymes  Recent Labs Lab 11/07/15 0941  TROPONINI 0.03    Microbiology Results  No results found for this or any previous visit.  RADIOLOGY:  No results found.  EKG:   Orders placed or performed during the hospital encounter of 11/07/15  . ED EKG  . ED EKG      Management plans discussed with the patient, family and they are in agreement.  CODE STATUS:   TOTAL TIME TAKING CARE OF THIS PATIENT: 37 minutes.    Gladstone Lighter M.D on 11/09/2015 at 4:53 PM  Between 7am to 6pm - Pager - [phone]  After 6pm go to [email or web address] - password [email or web address] [place] [facility]  Office  [phone]  CC: Primary care physician; No  primary care provider on file.

## 2015-11-29 ENCOUNTER — Encounter: Payer: Self-pay | Admitting: Physical Therapy

## 2015-11-29 ENCOUNTER — Ambulatory Visit: Payer: BLUE CROSS/BLUE SHIELD | Attending: Family Medicine | Admitting: Physical Therapy

## 2015-11-29 DIAGNOSIS — R202 Paresthesia of skin: Secondary | ICD-10-CM | POA: Diagnosis present

## 2015-11-29 DIAGNOSIS — I69898 Other sequelae of other cerebrovascular disease: Secondary | ICD-10-CM | POA: Insufficient documentation

## 2015-11-29 DIAGNOSIS — R531 Weakness: Secondary | ICD-10-CM | POA: Insufficient documentation

## 2015-11-29 DIAGNOSIS — R262 Difficulty in walking, not elsewhere classified: Secondary | ICD-10-CM | POA: Diagnosis not present

## 2015-11-29 DIAGNOSIS — R200 Anesthesia of skin: Secondary | ICD-10-CM

## 2015-11-29 DIAGNOSIS — IMO0002 Reserved for concepts with insufficient information to code with codable children: Secondary | ICD-10-CM

## 2015-11-29 NOTE — Therapy (Signed)
[place] MAIN [facility] SERVICES [address] [place], [place], [address] Phone: [phone]   Fax:  [phone]  Physical Therapy Evaluation  Patient Details  Name: Autumn Arnold MRN: [id number] Date of Birth: [birth year] Referring Provider: Netty Starring  Encounter Date: 11/29/2015      PT End of Session - 11/29/15 1723    Visit Number 1   Number of Visits 25   Date for PT Re-Evaluation Feb 25, 2016   Authorization Type g codes   PT Start Time 0520   PT Stop Time 0620   PT Time Calculation (min) 60 min   Equipment Utilized During Treatment Gait belt   Activity Tolerance Patient tolerated treatment well   Behavior During Therapy [facility] for tasks assessed/performed      Past Medical History  Diagnosis Date  . Diabetes mellitus 02-09-2007  . Hyperlipidemia   . Hypertension   . Stroke [facility]) February 08, 2010    headache, left arm numbness  . Sleep apnea     CPAP at [facility], Dr. Humphrey Rolls    Past Surgical History  Procedure Laterality Date  . Anterior fusion cervical spine    . Elbow surgery      Tendonitis  . Carpal tunnel release      Bilateral  . Incontinence surgery    . Abdominal hysterectomy    . Tonsillectomy    . Cesarean section      There were no vitals filed for this visit.  Visit Diagnosis:  Difficulty walking  Weakness due to cerebrovascular accident  Numbness and tingling in right hand  Numbness and tingling of right leg      Subjective Assessment - 11/29/15 1719    Subjective Patient was in the hospital 1/2-11/08/15.    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 5 minutes   How long can you walk comfortably? 10-15 minutes   Patient Stated Goals Patient wants to be able to walk better.    Currently in Pain? No/denies            [facility] PT Assessment -  11/30/15 0001    Assessment   Medical Diagnosis CVA   Referring Provider [place]   Onset Date/Surgical Date 11/07/15   Hand Dominance Left   Prior Therapy Hospital   Precautions   Precautions None   Restrictions   Weight Bearing Restrictions No   Balance Screen   Has the patient fallen in the past 6 months No   Has the patient had a decrease in activity level because of a fear of falling?  Yes   Is the patient reluctant to leave their home because of a fear of falling?  No   Home Ecologist residence   Living Arrangements Spouse/significant other   Available Help at Discharge Family   Type of [place] to enter   Entrance Stairs-Number of Steps 3   [place] One level   [place] Other (comment)  loftstrand crutch   Prior Function   Level of Independence Independent   Vocation Full time employment  Patient is unable to work now   [employer] standing and walking and carrying   Cognition   Overall Cognitive Status Within Functional Limits for tasks assessed   Attention Focused   Problem  Solving Appears intact         PAIN: no reports of pain  POSTURE: fwd trunk   PROM/AROM: WFL  STRENGTH:  Graded on a 0-5 scale Muscle Group Left Right  Shoulder flex 5 4  Shoulder Abd 5 4  Shoulder Ext 5 4  Shoulder IR/ER    Elbow 5 4  Wrist/hand 5 3  Hip Flex 4 3  Hip Abd 4 3  Hip Add 4 3  Hip Ext 3 3  Hip IR/ER 4 3  Knee Flex 4 3  Knee Ext 4 4  Ankle DF 4 3  Ankle PF 4 3   SENSATION: RUE  Numbness/ RLE numbness   :   FUNCTIONAL MOBILITY: slow and indendepnt   BALANCE: Static standing fair Dynamic standing poor Unable to perform tandem stand, unable to single leg stand  GAIT:   Patient ambulates with loftstrand crutches with RLE foot drop and dragging after 200 feet with slow gait speed           OUTCOME MEASURES: TEST Outcome Interpretation  5 times sit<>stand 17.21sec >25  yo, >15 sec indicates increased risk for falls  10 meter walk test   . 74              m/s <1.0 m/s indicates increased risk for falls; limited community ambulator  Timed up and Go  14. 73               sec <14 sec indicates increased risk for falls  6 minute walk test                Feet 1000 feet is community Water quality scientist  <36/56 (100% risk for falls), 37-45 (80% risk for falls); 46-51 (>50% risk for falls); 52-55 (lower risk <25% of falls)  9 Hole Peg Test L:                R:                         PT Education - 11/30/15 0908    Education provided Yes   Education Details HEP   Person(s) Educated Patient   Methods Explanation   Comprehension Verbalized understanding             PT Long Term Goals - 11/30/15 0915    PT LONG TERM GOAL #1   Title Patient will be independent in home exercise program to improve strength/mobility for better functional independence with ADLs.   Time 12   Period Weeks   Status New   PT LONG TERM GOAL #2   Title Patient (> 91 years old) will complete five times sit to stand test in < 15 seconds indicating an increased LE strength and improved balance   Time 12   Period Weeks   Status New   PT LONG TERM GOAL #3   Title Patient will increase six minute walk test distance to >1000 for progression to community ambulator and improve gait ability   Time 12   Period Weeks   Status New   PT LONG TERM GOAL #4   Title Patient will reduce timed up and go to <11 seconds to reduce fall risk and demonstrate improved transfer/gait ability   Time 12   Period Weeks   Status New               Plan - 11/29/15 1724    Clinical Impression  Statement Patient is 65 yr old female with recent CVA 11/07/15. She has decreased RLE strength, decreased static and dynamic standing balance, decreased coordination and motor control . She has decreased outcome measures that indicate a falls risk.    Pt will benefit from skilled  therapeutic intervention in order to improve on the following deficits Abnormal gait;Decreased coordination;Difficulty walking;Decreased endurance;Impaired vision/preception;Decreased activity tolerance;Decreased balance;Decreased scar mobility;Impaired sensation;Decreased strength   Rehab Potential Fair   Clinical Impairments Affecting Rehab Potential numbness, weakness, loftstrand crutch, diabetes, circulation problems, left eye blurry  : patients clinical presentation is stable   PT Frequency 2x / week   PT Duration 12 weeks   PT Treatment/Interventions Therapeutic exercise;Therapeutic activities;Stair training;Gait training;Balance training;Neuromuscular re-education   PT Next Visit Plan balance and strengthening   PT Home Exercise Plan LE exericses   Recommended Other Services OT   Consulted and Agree with Plan of Care Patient;Family member/caregiver   Family Member Consulted husband          G-Codes - 2015-12-26 1753    Functional Assessment Tool Used 5 x sit to stand, 10 MW, 6 MW   Functional Limitation Mobility: Walking and moving around   Mobility: Walking and Moving Around Current Status [phone]) At least 60 percent but less than 80 percent impaired, limited or restricted   Mobility: Walking and Moving Around Goal Status [phone]) At least 20 percent but less than 40 percent impaired, limited or restricted       Problem List Patient Active Problem List   Diagnosis Date Noted  . CVA (cerebral infarction) 11/07/2015   Alanson Puls, PT, DPT [place] S 11/30/2015, 9:34 AM  [place] MAIN [facility] SERVICES [address] [place], [place], [address] Phone: [phone]   Fax:  [phone]  Name: Autumn Arnold MRN: [id number] Date of Birth: [birth year]

## 2015-11-30 NOTE — Addendum Note (Signed)
Addended by: Alanson Puls on: 11/30/2015 09:37 AM   Modules accepted: Orders

## 2015-12-01 ENCOUNTER — Ambulatory Visit: Payer: BLUE CROSS/BLUE SHIELD | Admitting: Physical Therapy

## 2015-12-06 ENCOUNTER — Encounter: Payer: Self-pay | Admitting: Physical Therapy

## 2015-12-06 ENCOUNTER — Ambulatory Visit: Payer: BLUE CROSS/BLUE SHIELD | Admitting: Physical Therapy

## 2015-12-06 DIAGNOSIS — R262 Difficulty in walking, not elsewhere classified: Secondary | ICD-10-CM

## 2015-12-06 DIAGNOSIS — R202 Paresthesia of skin: Secondary | ICD-10-CM

## 2015-12-06 DIAGNOSIS — IMO0002 Reserved for concepts with insufficient information to code with codable children: Secondary | ICD-10-CM

## 2015-12-06 DIAGNOSIS — R200 Anesthesia of skin: Secondary | ICD-10-CM

## 2015-12-06 NOTE — Therapy (Signed)
[place] MAIN [facility] SERVICES [address] [place], [place], [address] Phone: [phone]   Fax:  [phone]  Physical Therapy Treatment  Patient Details  Name: Autumn Arnold MRN: [id number] Date of Birth: November 21, 1950 Referring Provider: Netty Starring  Encounter Date: 12/06/2015      PT End of Session - 12/06/15 1417    Visit Number 2   Number of Visits 25   Date for PT Re-Evaluation 03/11/2016   Authorization Type g codes   PT Start Time 0150   PT Stop Time 0230   PT Time Calculation (min) 40 min   Equipment Utilized During Treatment Gait belt   Activity Tolerance Patient tolerated treatment well   Behavior During Therapy [facility] for tasks assessed/performed      Past Medical History  Diagnosis Date  . Diabetes mellitus 2008  . Hyperlipidemia   . Hypertension   . Stroke [facility]) 02/2010    headache, left arm numbness  . Sleep apnea     CPAP at [facility], Dr. Humphrey Rolls    Past Surgical History  Procedure Laterality Date  . Anterior fusion cervical spine    . Elbow surgery      Tendonitis  . Carpal tunnel release      Bilateral  . Incontinence surgery    . Abdominal hysterectomy    . Tonsillectomy    . Cesarean section      There were no vitals filed for this visit.  Visit Diagnosis:  Difficulty walking  Weakness due to cerebrovascular accident  Numbness and tingling in right hand  Numbness and tingling of right leg      Subjective Assessment - 12/06/15 1415    Subjective Patient says that her left side is painful.   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 5 minutes   How long can you walk comfortably? 10-15 minutes   Patient Stated Goals Patient wants to be able to walk better.    Currently in Pain? Yes   Pain Score 7    Pain Location Leg   Pain  Orientation Left      standing hip abd with YTB x 20  side stepping left and right in parallel bars 10 feet x 3 standing on blue foam with cone reaching x 20 across midline step ups from floor to 6 inch stool x 20 bilateral sit to stand x 10 marching in parallel bars x 20.   leg press 90 lbs 20 x 3 Heel raises x 20 x 2 Tapping to stool with one hand assist  Single leg stand x 1 minute Tandem stand with 1 minute Patient has LLE fatigue with leg dragging in side stepping activity and Left foot spasticity evident with stepping pattern side stepping.                            PT Education - 12/06/15 1416    Education provided Yes   Person(s) Educated Patient   Methods Explanation   Comprehension Verbalized understanding             PT Long Term Goals - 11/30/15 0915    PT LONG TERM GOAL #1   Title Patient will be independent in home exercise program to improve strength/mobility for better functional independence  with ADLs.   Time 12   Period Weeks   Status New   PT LONG TERM GOAL #2   Title Patient (> 22 years old) will complete five times sit to stand test in < 15 seconds indicating an increased LE strength and improved balance   Time 12   Period Weeks   Status New   PT LONG TERM GOAL #3   Title Patient will increase six minute walk test distance to >1000 for progression to community ambulator and improve gait ability   Time 12   Period Weeks   Status New   PT LONG TERM GOAL #4   Title Patient will reduce timed up and go to <11 seconds to reduce fall risk and demonstrate improved transfer/gait ability   Time 12   Period Weeks   Status New               Plan - 12/06/15 1417    Clinical Impression Statement Patient is able to perform strengthening and balance exercises and has middle part of her  back and her left leg starts to hurt. She needs cuing for posture and correct exercises.    Pt will benefit from skilled therapeutic  intervention in order to improve on the following deficits Abnormal gait;Decreased coordination;Difficulty walking;Decreased endurance;Impaired vision/preception;Decreased activity tolerance;Decreased balance;Decreased scar mobility;Impaired sensation;Decreased strength   Rehab Potential Fair   Clinical Impairments Affecting Rehab Potential numbness, weakness, loftstrand crutch, diabetes, circulation problems, left eye blurry  : patients clinical presentation is stable   PT Frequency 2x / week   PT Duration 12 weeks   PT Treatment/Interventions Therapeutic exercise;Therapeutic activities;Stair training;Gait training;Balance training;Neuromuscular re-education   PT Next Visit Plan balance and strengthening   PT Home Exercise Plan LE exericses   Consulted and Agree with Plan of Care Patient;Family member/caregiver   Family Member Consulted husband        Problem List Patient Active Problem List   Diagnosis Date Noted  . CVA (cerebral infarction) 11/07/2015   Alanson Puls, PT, DPT [place], Minette Headland S 12/06/2015, 2:19 PM  [place] MAIN [facility] SERVICES [address] [place], [place], [address] Phone: [phone]   Fax:  [phone]  Name: Autumn Arnold MRN: [id number] Date of Birth: [birth year]

## 2015-12-08 ENCOUNTER — Encounter: Payer: Self-pay | Admitting: Physical Therapy

## 2015-12-08 ENCOUNTER — Ambulatory Visit: Payer: BLUE CROSS/BLUE SHIELD | Attending: Family Medicine | Admitting: Physical Therapy

## 2015-12-08 DIAGNOSIS — R531 Weakness: Secondary | ICD-10-CM | POA: Insufficient documentation

## 2015-12-08 DIAGNOSIS — R200 Anesthesia of skin: Secondary | ICD-10-CM

## 2015-12-08 DIAGNOSIS — R262 Difficulty in walking, not elsewhere classified: Secondary | ICD-10-CM | POA: Diagnosis not present

## 2015-12-08 DIAGNOSIS — R5381 Other malaise: Secondary | ICD-10-CM | POA: Insufficient documentation

## 2015-12-08 DIAGNOSIS — M6281 Muscle weakness (generalized): Secondary | ICD-10-CM | POA: Diagnosis present

## 2015-12-08 DIAGNOSIS — I69898 Other sequelae of other cerebrovascular disease: Secondary | ICD-10-CM | POA: Diagnosis present

## 2015-12-08 DIAGNOSIS — IMO0002 Reserved for concepts with insufficient information to code with codable children: Secondary | ICD-10-CM

## 2015-12-08 DIAGNOSIS — R279 Unspecified lack of coordination: Secondary | ICD-10-CM | POA: Insufficient documentation

## 2015-12-08 DIAGNOSIS — R202 Paresthesia of skin: Secondary | ICD-10-CM | POA: Diagnosis present

## 2015-12-08 DIAGNOSIS — I6980 Unspecified sequelae of other cerebrovascular disease: Secondary | ICD-10-CM | POA: Diagnosis present

## 2015-12-08 DIAGNOSIS — R460 Very low level of personal hygiene: Secondary | ICD-10-CM | POA: Insufficient documentation

## 2015-12-08 NOTE — Therapy (Signed)
[place] MAIN [facility] SERVICES [address] [place], [place], [address] Phone: [phone]   Fax:  [phone]  Physical Therapy Treatment  Patient Details  Name: Autumn Arnold MRN: [id number] Date of Birth: 1951-01-06 Referring Provider: Netty Starring  Encounter Date: 12/08/2015      PT End of Session - 12/08/15 1407    Visit Number 3   Number of Visits 25   Date for PT Re-Evaluation 2016/03/08   Authorization Type g codes   PT Start Time 0145   PT Stop Time 0230   PT Time Calculation (min) 45 min   Equipment Utilized During Treatment Gait belt   Activity Tolerance Patient tolerated treatment well   Behavior During Therapy [facility] for tasks assessed/performed      Past Medical History  Diagnosis Date  . Diabetes mellitus 2008  . Hyperlipidemia   . Hypertension   . Stroke [facility]) 02/2010    headache, left arm numbness  . Sleep apnea     CPAP at [facility], Dr. Humphrey Rolls    Past Surgical History  Procedure Laterality Date  . Anterior fusion cervical spine    . Elbow surgery      Tendonitis  . Carpal tunnel release      Bilateral  . Incontinence surgery    . Abdominal hysterectomy    . Tonsillectomy    . Cesarean section      There were no vitals filed for this visit.  Visit Diagnosis:  Difficulty walking  Weakness due to cerebrovascular accident  Numbness and tingling in right hand      Subjective Assessment - 12/08/15 1400    Subjective Patient says that her left side is painful.   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 5 minutes   How long can you walk comfortably? 10-15 minutes   Patient Stated Goals Patient wants to be able to walk better.    Currently in Pain? Yes   Pain Score 8    Pain Location Hip   Pain Orientation Left     Therapeutic  exercises:   Squats with 5 sec   standing hip abd with YTB x 20  side stepping left and right in parallel bars 10 feet x 3 standing on blue foam with cone reaching x 20 across midline step ups from floor to 6 inch stool x 20 bilateral sit to stand x 10 marching in parallel bars x 20 stepping pattern with weight shifting fwd/bwd x 10.  Leg press x 90 lbs x 20 x 2 Heel raises x 20 x 2 Min cueing needed to appropriately perform strengthening  tasks with leg, and head position. Decreased coordination demonstrated requiring consistent verbal cueing to correct form.  Patient continues to demonstrate some in coordination of movement with select exercises such as  stepping backwards. Patient responds well to verbal and tactile cues to correct form and technique.  CGA to SBA for safety with activities.  Uses to increase intensity and amplitude of movements throughout session                          PT Education - 12/08/15 1406    Education provided Yes   Education Details HEP   Person(s) Educated Patient   Methods Explanation  Comprehension Verbalized understanding             PT Long Term Goals - 11/30/15 0915    PT LONG TERM GOAL #1   Title Patient will be independent in home exercise program to improve strength/mobility for better functional independence with ADLs.   Time 12   Period Weeks   Status New   PT LONG TERM GOAL #2   Title Patient (> 37 years old) will complete five times sit to stand test in < 15 seconds indicating an increased LE strength and improved balance   Time 12   Period Weeks   Status New   PT LONG TERM GOAL #3   Title Patient will increase six minute walk test distance to >1000 for progression to community ambulator and improve gait ability   Time 12   Period Weeks   Status New   PT LONG TERM GOAL #4   Title Patient will reduce timed up and go to <11 seconds to reduce fall risk and demonstrate improved transfer/gait ability   Time  12   Period Weeks   Status New               Plan - 12/08/15 1407    Clinical Impression Statement Patient has trunk weakness and has difficulty standing up straight to perform exercises. also has Left hip pain today.   Pt will benefit from skilled therapeutic intervention in order to improve on the following deficits Abnormal gait;Decreased coordination;Difficulty walking;Decreased endurance;Impaired vision/preception;Decreased activity tolerance;Decreased balance;Decreased scar mobility;Impaired sensation;Decreased strength   Rehab Potential Fair   Clinical Impairments Affecting Rehab Potential numbness, weakness, loftstrand crutch, diabetes, circulation problems, left eye blurry  : patients clinical presentation is stable   PT Frequency 2x / week   PT Duration 12 weeks   PT Treatment/Interventions Therapeutic exercise;Therapeutic activities;Stair training;Gait training;Balance training;Neuromuscular re-education   PT Next Visit Plan balance and strengthening   PT Home Exercise Plan LE exericses   Consulted and Agree with Plan of Care Patient;Family member/caregiver   Family Member Consulted husband        Problem List Patient Active Problem List   Diagnosis Date Noted  . CVA (cerebral infarction) 11/07/2015   Alanson Puls, PT, DPT [place], Minette Headland S 12/08/2015, 2:10 PM  [place] MAIN [facility] SERVICES [address] [place], [place], [address] Phone: [phone]   Fax:  [phone]  Name: Autumn Arnold MRN: [id number] Date of Birth: Jun 17, 1951

## 2015-12-13 ENCOUNTER — Ambulatory Visit: Payer: BLUE CROSS/BLUE SHIELD | Admitting: Physical Therapy

## 2015-12-13 ENCOUNTER — Encounter: Payer: Self-pay | Admitting: Physical Therapy

## 2015-12-13 DIAGNOSIS — R200 Anesthesia of skin: Secondary | ICD-10-CM

## 2015-12-13 DIAGNOSIS — R202 Paresthesia of skin: Secondary | ICD-10-CM

## 2015-12-13 DIAGNOSIS — R262 Difficulty in walking, not elsewhere classified: Secondary | ICD-10-CM | POA: Diagnosis not present

## 2015-12-13 DIAGNOSIS — IMO0002 Reserved for concepts with insufficient information to code with codable children: Secondary | ICD-10-CM

## 2015-12-13 NOTE — Therapy (Signed)
[place] MAIN [facility] SERVICES [address] [place], [place], [address] Phone: [phone]   Fax:  [phone]  Physical Therapy Treatment  Patient Details  Name: Autumn Arnold MRN: [id number] Date of Birth: August 02, 1951 Referring Provider: Netty Starring  Encounter Date: 12/13/2015      PT End of Session - 12/13/15 1412    Visit Number 4   Number of Visits 25   Date for PT Re-Evaluation Mar 18, 2016   Authorization Type g codes   PT Start Time 0145   PT Stop Time 0230   PT Time Calculation (min) 45 min   Equipment Utilized During Treatment Gait belt   Activity Tolerance Patient tolerated treatment well   Behavior During Therapy [facility] for tasks assessed/performed      Past Medical History  Diagnosis Date  . Diabetes mellitus 2008  . Hyperlipidemia   . Hypertension   . Stroke [facility]) 02/2010    headache, left arm numbness  . Sleep apnea     CPAP at [facility], Dr. Humphrey Rolls    Past Surgical History  Procedure Laterality Date  . Anterior fusion cervical spine    . Elbow surgery      Tendonitis  . Carpal tunnel release      Bilateral  . Incontinence surgery    . Abdominal hysterectomy    . Tonsillectomy    . Cesarean section      There were no vitals filed for this visit.  Visit Diagnosis:  Difficulty walking  Weakness due to cerebrovascular accident  Numbness and tingling in right hand  Numbness and tingling of right leg      Subjective Assessment - 12/13/15 1411    Subjective Patient says that her left side is painful.   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 5 minutes   How long can you walk comfortably? 10-15 minutes   Patient Stated Goals Patient wants to be able to walk better.         THER-EX Standing exercises with RTB BLE : Marching 2 x 10; SLR  2 x 10; Abduction 2 x 10; Extension 2 x 10; Knee flexion 2 x 10; Heel raises 2 x 10; Eccentric step downs x 10 BLE Heel raises x 10 x 2  Resisted side-steeping RTB 4 lengths x 2; Standing mini squats 2 x 10 with RTB around knees to encourage abduction; Sit to stand without UE support 2 x 10; Step-ups to 6" step x 10 bilateral;   NEUROMUSCULAR RE-EDUCATION Airex NBOS eyes open/closed x 30 seconds each; Airex NBOS eyes open horizontal and vertical head turns x 30 seconds; Airex cone  reaching crossing midline  Toe tapping 6 inch stool without UE assist Tandem gait in // bars x 4 laps  Side stepping on blue  foam balance beam x 5 lengths of the parallel bars   Standing on foam NBOS  sorting balls/ sorting shapes reaching  Min cueing needed to appropriately perform balance tasks with leg, hand, and head position. Decreased coordination demonstrated requiring consistent verbal cueing to correct form. Patient continues to demonstrate some in coordination of movement with select exercises such as  stepping backwards. Patient responds well to verbal and tactile cues to correct form and technique.  CGA to SBA for safety with activities.  Uses to increase intensity  and amplitude of movements throughout session                          PT Education - 12/13/15 1412    Education provided Yes   Education Details HEP   Person(s) Educated Patient   Methods Explanation   Comprehension Verbalized understanding             PT Long Term Goals - 11/30/15 0915    PT LONG TERM GOAL #1   Title Patient will be independent in home exercise program to improve strength/mobility for better functional independence with ADLs.   Time 12   Period Weeks   Status New   PT LONG TERM GOAL #2   Title Patient (> 59 years old) will complete five times sit to stand test in < 15 seconds indicating an increased LE strength and improved balance   Time 12   Period Weeks   Status New   PT  LONG TERM GOAL #3   Title Patient will increase six minute walk test distance to >1000 for progression to community ambulator and improve gait ability   Time 12   Period Weeks   Status New   PT LONG TERM GOAL #4   Title Patient will reduce timed up and go to <11 seconds to reduce fall risk and demonstrate improved transfer/gait ability   Time 12   Period Weeks   Status New               Plan - 12/13/15 1412    Clinical Impression Statement PT provided min - moderate verbal instruction to improve set up, proper use of LE, and improved posture and gait mechanics. Patient responded moderately to instruction   Pt will benefit from skilled therapeutic intervention in order to improve on the following deficits Abnormal gait;Decreased coordination;Difficulty walking;Decreased endurance;Impaired vision/preception;Decreased activity tolerance;Decreased balance;Decreased scar mobility;Impaired sensation;Decreased strength   Rehab Potential Fair   Clinical Impairments Affecting Rehab Potential numbness, weakness, loftstrand crutch, diabetes, circulation problems, left eye blurry  : patients clinical presentation is stable   PT Frequency 2x / week   PT Duration 12 weeks   PT Treatment/Interventions Therapeutic exercise;Therapeutic activities;Stair training;Gait training;Balance training;Neuromuscular re-education   PT Next Visit Plan balance and strengthening   PT Home Exercise Plan LE exericses   Consulted and Agree with Plan of Care Patient;Family member/caregiver   Family Member Consulted husband        Problem List Patient Active Problem List   Diagnosis Date Noted  . CVA (cerebral infarction) 11/07/2015   Alanson Puls, PT, DPT Arelia Sneddon S 12/13/2015, 2:14 PM  [place] MAIN [facility] SERVICES [address] [place], [place], [address] Phone: [phone]   Fax:  [phone]  Name: Autumn Arnold MRN: [id number] Date of  Birth: 1951-01-15

## 2015-12-15 ENCOUNTER — Ambulatory Visit: Payer: BLUE CROSS/BLUE SHIELD | Admitting: Physical Therapy

## 2015-12-15 ENCOUNTER — Encounter: Payer: Self-pay | Admitting: Physical Therapy

## 2015-12-15 DIAGNOSIS — IMO0002 Reserved for concepts with insufficient information to code with codable children: Secondary | ICD-10-CM

## 2015-12-15 DIAGNOSIS — R262 Difficulty in walking, not elsewhere classified: Secondary | ICD-10-CM | POA: Diagnosis not present

## 2015-12-15 DIAGNOSIS — R200 Anesthesia of skin: Secondary | ICD-10-CM

## 2015-12-15 DIAGNOSIS — R202 Paresthesia of skin: Secondary | ICD-10-CM

## 2015-12-15 NOTE — Therapy (Signed)
[place] MAIN [facility] SERVICES [address] [place], [place], [address] Phone: [phone]   Fax:  [phone]  Physical Therapy Treatment  Patient Details  Name: Autumn Arnold MRN: [id number] Date of Birth: October 19, 1951 Referring Provider: Netty Starring  Encounter Date: 12/15/2015      PT End of Session - 12/15/15 1358    Visit Number 5   Number of Visits 25   Date for PT Re-Evaluation Feb 28, 2016   Authorization Type g codes   PT Start Time 0150   PT Stop Time 0230   PT Time Calculation (min) 40 min   Equipment Utilized During Treatment Gait belt   Activity Tolerance Patient tolerated treatment well   Behavior During Therapy [facility] for tasks assessed/performed      Past Medical History  Diagnosis Date  . Diabetes mellitus 2008  . Hyperlipidemia   . Hypertension   . Stroke [facility]) 02/2010    headache, left arm numbness  . Sleep apnea     CPAP at [facility], Dr. Humphrey Rolls    Past Surgical History  Procedure Laterality Date  . Anterior fusion cervical spine    . Elbow surgery      Tendonitis  . Carpal tunnel release      Bilateral  . Incontinence surgery    . Abdominal hysterectomy    . Tonsillectomy    . Cesarean section      There were no vitals filed for this visit.  Visit Diagnosis:  Difficulty walking  Weakness due to cerebrovascular accident  Numbness and tingling in right hand      Subjective Assessment - 12/15/15 1356    Subjective Patient says that her left side is painful. She is trying to do more walking without her loftstrand crutch.   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 5 minutes   How long can you walk comfortably? 10-15 minutes   Patient Stated Goals Patient wants to be able to walk better.    Currently in Pain? No/denies   Pain Score 0-No  pain   Pain Location Hip        THER-EX Standing exercises with RTB BLE : Marching 2 x 10; SLR 2 x 10; Abduction 2 x 10; Extension 2 x 10; Knee flexion 2 x 10; Heel raises 2 x 10; Eccentric step downs x 10 BLE Squats x 10 with 5 sec hold Heel raises x 10 x 2  Matrix fwd / bwds steeping  4 lengths ; Standing mini squats 2 x 10  Sit to stand without UE support 2 x 10; Step-ups to 6" step x 10 bilateral; Quantum leg press 75  lbs x 20 x 2 Gait training with TM . 7 miles/hour and 6 minutes and 4 minutes Patient needs constant verbal cueing to improve posture and cueing to correctly perform exercises slowly, holding at end of range to increase motor firing of desired muscle to encourage fatigue.                           PT Education - 12/15/15 1357    Education provided Yes   Education Details HEP   Person(s) Educated Patient   Methods Explanation   Comprehension Verbalized understanding  PT Long Term Goals - 11/30/15 0915    PT LONG TERM GOAL #1   Title Patient will be independent in home exercise program to improve strength/mobility for better functional independence with ADLs.   Time 12   Period Weeks   Status New   PT LONG TERM GOAL #2   Title Patient (> 39 years old) will complete five times sit to stand test in < 15 seconds indicating an increased LE strength and improved balance   Time 12   Period Weeks   Status New   PT LONG TERM GOAL #3   Title Patient will increase six minute walk test distance to >1000 for progression to community ambulator and improve gait ability   Time 12   Period Weeks   Status New   PT LONG TERM GOAL #4   Title Patient will reduce timed up and go to <11 seconds to reduce fall risk and demonstrate improved transfer/gait ability   Time 12   Period Weeks   Status New               Plan - 12/15/15 1358    Clinical Impression Statement Patient has fatigue with standing exercises and needs  constant VC to have correct posture   Pt will benefit from skilled therapeutic intervention in order to improve on the following deficits Abnormal gait;Decreased coordination;Difficulty walking;Decreased endurance;Impaired vision/preception;Decreased activity tolerance;Decreased balance;Decreased scar mobility;Impaired sensation;Decreased strength   Rehab Potential Fair   Clinical Impairments Affecting Rehab Potential numbness, weakness, loftstrand crutch, diabetes, circulation problems, left eye blurry  : patients clinical presentation is stable   PT Frequency 2x / week   PT Duration 12 weeks   PT Treatment/Interventions Therapeutic exercise;Therapeutic activities;Stair training;Gait training;Balance training;Neuromuscular re-education   PT Next Visit Plan balance and strengthening   PT Home Exercise Plan LE exericses   Consulted and Agree with Plan of Care Patient;Family member/caregiver   Family Member Consulted husband        Problem List Patient Active Problem List   Diagnosis Date Noted  . CVA (cerebral infarction) 11/07/2015  Alanson Puls, PT, DPT  Arelia Sneddon S 12/15/2015, 2:01 PM  [place] MAIN [facility] SERVICES [address] [place], [place], [address] Phone: [phone]   Fax:  [phone]  Name: Autumn Arnold MRN: [id number] Date of Birth: 1951-09-24

## 2015-12-20 ENCOUNTER — Ambulatory Visit: Payer: BLUE CROSS/BLUE SHIELD | Admitting: Physical Therapy

## 2015-12-20 ENCOUNTER — Encounter: Payer: Self-pay | Admitting: Occupational Therapy

## 2015-12-20 ENCOUNTER — Encounter: Payer: Self-pay | Admitting: Physical Therapy

## 2015-12-20 ENCOUNTER — Ambulatory Visit: Payer: BLUE CROSS/BLUE SHIELD | Admitting: Occupational Therapy

## 2015-12-20 DIAGNOSIS — R262 Difficulty in walking, not elsewhere classified: Secondary | ICD-10-CM | POA: Diagnosis not present

## 2015-12-20 DIAGNOSIS — M6281 Muscle weakness (generalized): Secondary | ICD-10-CM

## 2015-12-20 DIAGNOSIS — R200 Anesthesia of skin: Secondary | ICD-10-CM

## 2015-12-20 DIAGNOSIS — Z789 Other specified health status: Secondary | ICD-10-CM

## 2015-12-20 DIAGNOSIS — R6889 Other general symptoms and signs: Secondary | ICD-10-CM

## 2015-12-20 DIAGNOSIS — R202 Paresthesia of skin: Secondary | ICD-10-CM

## 2015-12-20 DIAGNOSIS — IMO0002 Reserved for concepts with insufficient information to code with codable children: Secondary | ICD-10-CM

## 2015-12-20 DIAGNOSIS — Z741 Need for assistance with personal care: Secondary | ICD-10-CM

## 2015-12-20 DIAGNOSIS — R460 Very low level of personal hygiene: Secondary | ICD-10-CM

## 2015-12-20 NOTE — Therapy (Signed)
[place] MAIN [facility] SERVICES [address] [place], [place], [address] Phone: [phone]   Fax:  [phone]  Physical Therapy Treatment  Patient Details  Name: Autumn Arnold MRN: [id number] Date of Birth: Sep 10, 1951 Referring Provider: Netty Starring  Encounter Date: 12/20/2015      PT End of Session - 12/20/15 1013    Visit Number 6   Number of Visits 25   Date for PT Re-Evaluation Mar 06, 2016   Authorization Type g codes   PT Start Time 1010   PT Stop Time 1050   PT Time Calculation (min) 40 min   Equipment Utilized During Treatment Gait belt   Activity Tolerance Patient tolerated treatment well   Behavior During Therapy [facility] for tasks assessed/performed      Past Medical History  Diagnosis Date  . Diabetes mellitus 2008  . Hyperlipidemia   . Hypertension   . Stroke [facility]) 02/2010    headache, left arm numbness  . Sleep apnea     CPAP at [facility], Dr. Humphrey Rolls    Past Surgical History  Procedure Laterality Date  . Anterior fusion cervical spine    . Elbow surgery      Tendonitis  . Carpal tunnel release      Bilateral  . Incontinence surgery    . Abdominal hysterectomy    . Tonsillectomy    . Cesarean section      There were no vitals filed for this visit.  Visit Diagnosis:  Difficulty walking  Weakness due to cerebrovascular accident  Numbness and tingling in right hand      Subjective Assessment - 12/20/15 1013    Subjective Patient says that her left side is painful. She is trying to do more walking without her loftstrand crutch.   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 5 minutes   How long can you walk comfortably? 10-15 minutes   Patient Stated Goals Patient wants to be able to walk better.    Currently in Pain? No/denies       NEUROMUSCULAR RE-EDUCATION Airex NBOS eyes open/closed x 30 seconds each; Airex NBOS eyes open horizontal and vertical head turns x 30 seconds; Airex cone  reaching crossing midline  Toe tapping 6 inch stool without UE assist Tandem gait in // bars x 4 laps  Side stepping on blue  foam balance beam x 5 lengths of the parallel bars  4 square fwd/bwd, side to side stepping/ diagonal stepping Standing on foam NBOS  sorting balls/ sorting shapes reaching  Therapeutic exercise; Leg press x 20 x 3 100 lbs,  heel raises with 90 lbs x 20 x 3 Heel raises standing x 20 Squat x 20  4 way hip RTB x 20 Patients right ankle is not able to support her during sustained standing exercises, and she needs rest periods.    Patient needs occasional verbal cueing to improve posture and cueing to correctly perform exercises slowly, holding at end of range to increase motor firing of desired muscle to encourage fatigue.                            PT Education - 12/20/15 1013    Education provided Yes   Education Details HEP   Person(s) Educated Patient  Methods Explanation   Comprehension Verbalized understanding             PT Long Term Goals - 11/30/15 0915    PT LONG TERM GOAL #1   Title Patient will be independent in home exercise program to improve strength/mobility for better functional independence with ADLs.   Time 12   Period Weeks   Status New   PT LONG TERM GOAL #2   Title Patient (> 36 years old) will complete five times sit to stand test in < 15 seconds indicating an increased LE strength and improved balance   Time 12   Period Weeks   Status New   PT LONG TERM GOAL #3   Title Patient will increase six minute walk test distance to >1000 for progression to community ambulator and improve gait ability   Time 12   Period Weeks   Status New   PT LONG TERM GOAL #4   Title Patient will reduce timed up and go to <11 seconds to reduce fall risk and  demonstrate improved transfer/gait ability   Time 12   Period Weeks   Status New               Plan - 12/20/15 1014    Clinical Impression Statement Patient demonstrating more control with  squat exercise.Patient has fatigue with standing exercises and needs constant VC to have correct posture.   Pt will benefit from skilled therapeutic intervention in order to improve on the following deficits Abnormal gait;Decreased coordination;Difficulty walking;Decreased endurance;Impaired vision/preception;Decreased activity tolerance;Decreased balance;Decreased scar mobility;Impaired sensation;Decreased strength   Rehab Potential Fair   Clinical Impairments Affecting Rehab Potential numbness, weakness, loftstrand crutch, diabetes, circulation problems, left eye blurry  : patients clinical presentation is stable   PT Frequency 2x / week   PT Duration 12 weeks   PT Treatment/Interventions Therapeutic exercise;Therapeutic activities;Stair training;Gait training;Balance training;Neuromuscular re-education   PT Next Visit Plan balance and strengthening   PT Home Exercise Plan LE exericses   Consulted and Agree with Plan of Care Patient;Family member/caregiver   Family Member Consulted husband        Problem List Patient Active Problem List   Diagnosis Date Noted  . CVA (cerebral infarction) 11/07/2015   Alanson Puls, PT, DPT [place] S 12/20/2015, 10:16 AM  [place] MAIN [facility] SERVICES [address] [place], [place], [address] Phone: [phone]   Fax:  [phone]  Name: Autumn Arnold MRN: [id number] Date of Birth: 1950/12/31

## 2015-12-22 ENCOUNTER — Ambulatory Visit: Payer: BLUE CROSS/BLUE SHIELD | Admitting: Occupational Therapy

## 2015-12-22 ENCOUNTER — Ambulatory Visit: Payer: BLUE CROSS/BLUE SHIELD | Admitting: Physical Therapy

## 2015-12-22 ENCOUNTER — Encounter: Payer: Self-pay | Admitting: Physical Therapy

## 2015-12-22 DIAGNOSIS — R262 Difficulty in walking, not elsewhere classified: Secondary | ICD-10-CM | POA: Diagnosis not present

## 2015-12-22 DIAGNOSIS — R202 Paresthesia of skin: Secondary | ICD-10-CM

## 2015-12-22 DIAGNOSIS — Z789 Other specified health status: Secondary | ICD-10-CM

## 2015-12-22 DIAGNOSIS — IMO0002 Reserved for concepts with insufficient information to code with codable children: Secondary | ICD-10-CM

## 2015-12-22 DIAGNOSIS — R460 Very low level of personal hygiene: Secondary | ICD-10-CM

## 2015-12-22 DIAGNOSIS — R6889 Other general symptoms and signs: Secondary | ICD-10-CM

## 2015-12-22 DIAGNOSIS — R200 Anesthesia of skin: Secondary | ICD-10-CM

## 2015-12-22 DIAGNOSIS — Z741 Need for assistance with personal care: Secondary | ICD-10-CM

## 2015-12-22 NOTE — Therapy (Signed)
[place] MAIN [facility] SERVICES [address] [place], [place], [address] Phone: [phone]   Fax:  [phone]  Physical Therapy Treatment  Patient Details  Name: Autumn Arnold MRN: [id number] Date of Birth: 19-Jul-1951 Referring Provider: Netty Starring  Encounter Date: 12/22/2015      PT End of Session - 12/22/15 1017    Visit Number 7   Number of Visits 25   Date for PT Re-Evaluation Feb 25, 2016   Authorization Type g codes   PT Start Time [id number]   PT Stop Time 1100   PT Time Calculation (min) 45 min   Equipment Utilized During Treatment Gait belt   Activity Tolerance Patient tolerated treatment well;Patient limited by pain;Patient limited by fatigue      Past Medical History  Diagnosis Date  . Diabetes mellitus 2008  . Hyperlipidemia   . Hypertension   . Stroke [facility]) 02/2010    headache, left arm numbness  . Sleep apnea     CPAP at [facility], Dr. Humphrey Rolls    Past Surgical History  Procedure Laterality Date  . Anterior fusion cervical spine    . Elbow surgery      Tendonitis  . Carpal tunnel release      Bilateral  . Incontinence surgery    . Abdominal hysterectomy    . Tonsillectomy    . Cesarean section      There were no vitals filed for this visit.  Visit Diagnosis:  Difficulty walking  Weakness due to cerebrovascular accident  Numbness and tingling in right hand      Subjective Assessment - 12/22/15 1015    Subjective Patient says that her left side is painful. She is trying to do more walking without her loftstrand crutch. Her right knee is given out once today.    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 5 minutes   How long can you walk comfortably? 10-15 minutes   Patient Stated Goals Patient wants to be able to walk better.    Currently in  Pain? Yes   Pain Score 5     Therapeutic exercise:  Leg press:6  plates 3x10  side steps in // bars with yellow band x 5 laps  Neuromuscular Re-education   Marching in place on blue foam pad x 30 seconds for 2 sets   Tandem standing in // bars  x 1 minute Step ups to blue foam pad x 10 bilaterally for 2 sets bilaterally   Heel raises bilateral feet  Sit to stand training x5 repetitions for 3 sets Feet together on blue foam and holding ball x 1 minute, holding ball and fwd and bwd movement elbow flex/ext x 20, horizontal abd/add with ball and arms extended. Toe taps on AIREX + step 3x10 no Ue Fwd step up onto 6inch step from AIREX no UE Fwd step up onto 4inch step no UE x 10 each side CGA to min A needed CGA and Min to mod verbal cues used throughout with increased in postural sway and LOB most seen with narrow base of support and while on uneven surfaces. Continues to have balance deficits typical with diagnosis. Patient performs intermediate level exercises without pain behaviors and needs verbal cuing for postural alignment and head positioning Tactile cues and assistance needed to keep lower leg and  knee in neutral to avoid compensations with ankle motions.                            PT Education - 12/22/15 1016    Education provided Yes   Education Details HEP   Person(s) Educated Patient   Methods Explanation   Comprehension Verbalized understanding             PT Long Term Goals - 11/30/15 0915    PT LONG TERM GOAL #1   Title Patient will be independent in home exercise program to improve strength/mobility for better functional independence with ADLs.   Time 12   Period Weeks   Status New   PT LONG TERM GOAL #2   Title Patient (> 78 years old) will complete five times sit to stand test in < 15 seconds indicating an increased LE strength and improved balance   Time 12   Period Weeks   Status New   PT LONG TERM GOAL #3   Title Patient will  increase six minute walk test distance to >1000 for progression to community ambulator and improve gait ability   Time 12   Period Weeks   Status New   PT LONG TERM GOAL #4   Title Patient will reduce timed up and go to <11 seconds to reduce fall risk and demonstrate improved transfer/gait ability   Time 12   Period Weeks   Status New               Plan - 12/22/15 1017    Clinical Impression Statement  Min cueing needed during hip 3-way to maintain upright posture with knees straight. Pt had good performance of therapeutic exercise today   Pt will benefit from skilled therapeutic intervention in order to improve on the following deficits Abnormal gait;Decreased coordination;Difficulty walking;Decreased endurance;Impaired vision/preception;Decreased activity tolerance;Decreased balance;Decreased scar mobility;Impaired sensation;Decreased strength   Rehab Potential Fair   Clinical Impairments Affecting Rehab Potential numbness, weakness, loftstrand crutch, diabetes, circulation problems, left eye blurry  : patients clinical presentation is stable   PT Frequency 2x / week   PT Duration 12 weeks   PT Treatment/Interventions Therapeutic exercise;Therapeutic activities;Stair training;Gait training;Balance training;Neuromuscular re-education   PT Next Visit Plan balance and strengthening   PT Home Exercise Plan LE exericses   Consulted and Agree with Plan of Care Patient;Family member/caregiver   Family Member Consulted husband        Problem List Patient Active Problem List   Diagnosis Date Noted  . CVA (cerebral infarction) 11/07/2015   Alanson Puls, PT, DPT [place] S 12/22/2015, 10:19 AM  [place] MAIN [facility] SERVICES [address] [place], [place], [address] Phone: [phone]   Fax:  [phone]  Name: Autumn Arnold MRN: [id number] Date of Birth: Apr 24, 1951

## 2015-12-23 ENCOUNTER — Encounter: Payer: Self-pay | Admitting: Occupational Therapy

## 2015-12-23 NOTE — Therapy (Signed)
[place] MAIN [facility] SERVICES [address] [place], [place], [address] Phone: [phone]   Fax:  [phone]  Occupational Therapy Evaluation  Patient Details  Name: Autumn Arnold MRN: [id number] Date of Birth: 1951/11/05 No Data Recorded  Encounter Date: 12/20/2015      OT End of Session - 12/23/15 1040    Visit Number 1   Number of Visits 24   Date for OT Re-Evaluation 12/20/15   OT Start Time 0927   OT Stop Time 1015   OT Time Calculation (min) 48 min   Activity Tolerance Patient tolerated treatment well   Behavior During Therapy [facility] for tasks assessed/performed      Past Medical History  Diagnosis Date  . Diabetes mellitus 2008  . Hyperlipidemia   . Hypertension   . Stroke [facility]) 02/2010    headache, left arm numbness  . Sleep apnea     CPAP at [facility], Dr. Humphrey Rolls    Past Surgical History  Procedure Laterality Date  . Anterior fusion cervical spine    . Elbow surgery      Tendonitis  . Carpal tunnel release      Bilateral  . Incontinence surgery    . Abdominal hysterectomy    . Tonsillectomy    . Cesarean section      There were no vitals filed for this visit.  Visit Diagnosis:  Muscle weakness (generalized) - Plan: Ot plan of care cert/re-cert  Lack of coordination due to stroke - Plan: Ot plan of care cert/re-cert  Self-care deficit for dressing and grooming - Plan: Ot plan of care cert/re-cert  Impaired instrumental activities of daily living (IADL) - Plan: Ot plan of care cert/re-cert      Subjective Assessment - 12/23/15 1023    Subjective  She reports she had difficulty standing up one day and thought she maybe having a stroke.  Came to the ER at [facility] on 11/07/2015, she was discharged the next day.     Patient Stated Goals Patient wants to be able to have full use of her arm and leg to be able to take care of herself and be independent.     Currently in Pain? No/denies   Pain Score 0-No pain            [facility] OT Assessment - 12/23/15 1025    Assessment   Diagnosis CVA   Onset Date 11/06/15   Prior Therapy PT   Balance Screen   Has the patient fallen in the past 6 months No   Home  Environment   Family/patient expects to be discharged to: Private residence   Living Arrangements Spouse/significant other   Available Help at Discharge Family   Type of [place] Access Level entry   [place] One level   Bathroom Shower/Tub Tub/Shower unit;Curtain   [place] With Spouse   Prior Function   Level of [place] On disability;Full time employment   ADL   Eating/Feeding Needs assist with cutting food   Grooming Modified independent   Upper Body Bathing Modified independent   Lower Body Bathing Increased time   Upper Body Dressing Needs assist for fasteners;Increased time   Lower Body Dressing Increased time;Minimal assistance   Toilet Tranfer Modified independent   Toileting - Clothing Manipulation Increased time   Toileting -  Hygiene Modified Independent   Tub/Shower Transfer Minimal assistance  Equipment Used Other (comment)   Transfers/Ambulation Related to ADL's crutch   ADL comments Patient was independent with functional mobility prior and now uses one crutch.  Patient requires assist with cutting, often switches hands during feeding.  She is left handed but uses her right hand for most everything else.  Unable to tie shoes, difficulty with underwear and pants donning.  Issues with prolonged standing, unable to hold objects overhead, will need to be able to pick up items at work in totes, pull shirts over a press.  She is unable to open containers with the right hand.  She cannot hold a full pot of coffee to pour it.  She is unable to fasten her bra at this time.     IADL   Prior Level of Function Shopping independent   Shopping Completely unable to shop   Prior Level of Function Light  Housekeeping independent   Light Housekeeping Launders small items, rinses stockings, etc.   Prior Level of Function Meal Prep independent    Meal Prep Needs to have meals prepared and served   [employer] on family or friends for transportation   Medication Management Is responsible for taking medication in correct dosages at correct time   Prior Level of Function Financial Management did this together   Physiological scientist financial matters independently (budgets, writes checks, pays rent, bills goes to bank), collects and keeps track of income   Mobility   Mobility Status Needs assist   Mobility Status Comments one crutch   Written Expression   Dominant Hand Left   Vision Assessment   Comment Patient reports she has bilateral cataracts which need to be removed, both eyes blurry, worse in left eye than right.     Cognition   Overall Cognitive Status Within Functional Limits for tasks assessed   Sensation   Light Touch Appears Intact   Stereognosis Appears Intact   Additional Comments sharp/dull intact   Coordination   Gross Motor Movements are Fluid and Coordinated No   Fine Motor Movements are Fluid and Coordinated No   Finger Nose Finger Test impaired   9 Hole Peg Test Right;Left   Right 9 Hole Peg Test 29   Left 9 Hole Peg Test 27   ROM / Strength   AROM / PROM / Strength AROM;Strength   AROM   Overall AROM  Within functional limits for tasks performed   Overall AROM Comments Right shoulder flexion to 135 degrees. LUE WFLs   Strength   Overall Strength Deficits   Overall Strength Comments Right UE 4-/5 overall, LUE 4/5 overalll   Hand Function   Right Hand Grip (lbs) 34   Right Hand Lateral Pinch 10 lbs   Right Hand 3 Point Pinch 13 lbs   Left Hand Grip (lbs) 38   Left Hand Lateral Pinch 8 lbs   Left 3 point pinch 10 lbs   Sensation Exercises   Stereognosis intact                         OT Education - 12/23/15 1040     Education provided Yes   Education Details role of OT   Person(s) Educated Patient   Methods Explanation   Comprehension Verbalized understanding             OT Long Term Goals - 12/23/15 1349    OT LONG TERM GOAL #1   Title Patient will be able to hold objects  overhead for 30 secs for inspection and repeat up to 10 times for work related task.     Baseline unable at eval   Time 12   Period Weeks   Status New   OT LONG TERM GOAL #2   Title Patient will be able to lift 10# tote as simulated work task with modified independence and move from one surface to another.    Baseline unable at eval    Time 12   Period Weeks   Status New   OT LONG TERM GOAL #3   Title Patient will don pants with modified independence with good balance.   Baseline CGA to min assist   Time 12   Period Weeks   Status New   OT LONG TERM GOAL #4   Title Patient will improve grip strength on right by 10# to assist with opening jars and containers with modified independence.    Baseline 34# at eval   Time 12   Period Weeks   Status New   OT LONG TERM GOAL #5   Title Patient will improve coordination and strength to be able to don and fasten her bra with modified independence.    Baseline min assist   Time 12   Period Weeks   Status New   Long Term Additional Goals   Additional Long Term Goals Yes   OT LONG TERM GOAL #6   Title Patient will increase strength by 1 mm grade to lift and pour a cup of coffee from full carafe.     Baseline unable, shaky   Time 12   Period Weeks   Status New               Plan - 12/23/15 1042    Clinical Impression Statement Patient is a 65 yo female who suffered a stroke on 11/07/2015 and was hospitalized overnight.  She was previously independent with all tasks including working full time, driving and performing all ADLs and IADLs.  She lives at home with her husband.  She works as a Print production planner at [employer] and helps with tasks such as pressing logos on  shirts and analyzing quality of shirts prior to sending them out.  She is unable to drive at this time.  She presents with muscle weakness, decreased coordination, decreased ability to perform self-care and tasks around the home and is unable to work presently but is planning to return in the future if possible. She would benefit from skilled OT to maximize safety and independence in daily tasks at home and with potential return to work.    Pt will benefit from skilled therapeutic intervention in order to improve on the following deficits (Retired) Abnormal gait;Impaired vision/preception;Decreased knowledge of use of DME;Decreased strength;Decreased balance;Difficulty walking;Decreased range of motion;Pain;Decreased coordination;Impaired UE functional use   Rehab Potential Good   OT Frequency 2x / week   OT Duration 12 weeks   OT Treatment/Interventions Self-care/ADL training;Therapeutic exercise;Functional Mobility Training;Patient/family education;Neuromuscular education;Manual Therapy;Balance training;Therapeutic exercises;DME and/or AE instruction   Consulted and Agree with Plan of Care Patient        Problem List Patient Active Problem List   Diagnosis Date Noted  . CVA (cerebral infarction) 11/07/2015   Holley Wirt T Tomasita Morrow, OTR/L, CLT  Salathiel Ferrara 12/23/2015, 2:01 PM  [place] MAIN [facility] SERVICES [address] [place], [place], [address] Phone: [phone]   Fax:  [phone]  Name: Autumn Arnold MRN: [id number] Date of Birth: 11-05-1951

## 2015-12-23 NOTE — Therapy (Signed)
[facility] SERVICES [address] [place], [place], [address] Phone: [phone]   Fax:  [phone]  Occupational Therapy Treatment  Patient Details  Name: Autumn Arnold MRN: [id number] Date of Birth: May 02, 1951 No Data Recorded  Encounter Date: 12/22/2015      OT End of Session - 12/23/15 1640    Visit Number 2   Number of Visits 24   Date for OT Re-Evaluation 12/20/15   Activity Tolerance Patient tolerated treatment well   Behavior During Therapy [facility] for tasks assessed/performed      Past Medical History  Diagnosis Date  . Diabetes mellitus 2008  . Hyperlipidemia   . Hypertension   . Stroke [facility]) 02/2010    headache, left arm numbness  . Sleep apnea     CPAP at [facility], Dr. Humphrey Rolls    Past Surgical History  Procedure Laterality Date  . Anterior fusion cervical spine    . Elbow surgery      Tendonitis  . Carpal tunnel release      Bilateral  . Incontinence surgery    . Abdominal hysterectomy    . Tonsillectomy    . Cesarean section      There were no vitals filed for this visit.  Visit Diagnosis:  Weakness due to cerebrovascular accident  Lack of coordination due to stroke  Self-care deficit for dressing and grooming  Impaired instrumental activities of daily living (IADL)      Subjective Assessment - 12/23/15 1623    Subjective  Patient reports she is ready to get started with therapy, wants to get stronger.     Patient Stated Goals Patient wants to be able to have full use of her arm and leg to be able to take care of herself and be independent.     Currently in Pain? No/denies   Pain Score 0-No pain   Multiple Pain Sites No            [facility] OT Assessment - 12/23/15 1025    Assessment   Diagnosis CVA   Onset Date 11/06/15   Prior Therapy PT   Balance Screen   Has the patient fallen in the past 6 months No   Home  Environment   Family/patient expects to be discharged to: Private residence    Living Arrangements Spouse/significant other   Available Help at Discharge Family   Type of [place] Access Level entry   [place] One level   Bathroom Shower/Tub Tub/Shower unit;Curtain   [place] With Spouse   Prior Function   Level of [place] On disability;Full time employment   ADL   Eating/Feeding Needs assist with cutting food   Grooming Modified independent   Upper Body Bathing Modified independent   Lower Body Bathing Increased time   Upper Body Dressing Needs assist for fasteners;Increased time   Lower Body Dressing Increased time;Minimal assistance   Toilet Tranfer Modified independent   Toileting - Clothing Manipulation Increased time   Toileting -  Hygiene Modified Independent   Tub/Shower Transfer Minimal assistance   Equipment Used Other (comment)   Transfers/Ambulation Related to ADL's crutch   ADL comments Patient was independent with functional mobility prior and now uses one crutch.  Patient requires assist with cutting, often switches hands during feeding.  She is left handed but uses her right hand for most everything else.  Unable to tie  shoes, difficulty with underwear and pants donning.  Issues with prolonged standing, unable to hold objects overhead, will need to be able to pick up items at work in totes, pull shirts over a press.  She is unable to open containers with the right hand.  She cannot hold a full pot of coffee to pour it.  She is unable to fasten her bra at this time.     IADL   Prior Level of Function Shopping independent   Shopping Completely unable to shop   Prior Level of Function Light Housekeeping independent   Light Housekeeping Launders small items, rinses stockings, etc.   Prior Level of Function Meal Prep independent    Meal Prep Needs to have meals prepared and served   [employer] on family or friends for transportation   Medication  Management Is responsible for taking medication in correct dosages at correct time   Prior Level of Function Financial Management did this together   Physiological scientist financial matters independently (budgets, writes checks, pays rent, bills goes to bank), collects and keeps track of income   Mobility   Mobility Status Needs assist   Mobility Status Comments one crutch   Written Expression   Dominant Hand Left   Vision Assessment   Comment Patient reports she has bilateral cataracts which need to be removed, both eyes blurry, worse in left eye than right.     Cognition   Overall Cognitive Status Within Functional Limits for tasks assessed   Sensation   Light Touch Appears Intact   Stereognosis Appears Intact   Additional Comments sharp/dull intact   Coordination   Gross Motor Movements are Fluid and Coordinated No   Fine Motor Movements are Fluid and Coordinated No   Finger Nose Finger Test impaired   9 Hole Peg Test Right;Left   Right 9 Hole Peg Test 29   Left 9 Hole Peg Test 27   ROM / Strength   AROM / PROM / Strength AROM;Strength   AROM   Overall AROM  Within functional limits for tasks performed   Overall AROM Comments Right shoulder flexion to 135 degrees. LUE WFLs   Strength   Overall Strength Deficits   Overall Strength Comments Right UE 4-/5 overall, LUE 4/5 overalll   Hand Function   Right Hand Grip (lbs) 34   Right Hand Lateral Pinch 10 lbs   Right Hand 3 Point Pinch 13 lbs   Left Hand Grip (lbs) 38   Left Hand Lateral Pinch 8 lbs   Left 3 point pinch 10 lbs   Sensation Exercises   Stereognosis intact                  OT Treatments/Exercises (OP) - 12/23/15 1623    Fine Motor Coordination   Other Fine Motor Exercises Patient seen for manipulation of [place] like discs for 2 sets of 30 reps with the right hand.  Manipulation of coins with right hand, cues for translatory movements of the hand and using the hand for storage and then  reaching to place it into a resistive bank requiring digit strength.     Neurological Re-education Exercises   Other Exercises 1 Patient seen for strengthening for 1# dowel for 10 reps for warm up.  4# chest press, ABD/ADD, shoulder flexion for 10 reps for 1 set each.  2.5# forwards/backwards circles for 10 reps.  Resistive pinch with all levels of resistance pins, placing onto elevated surface.   Sensation Exercises  Stereognosis intact                OT Education - 12/23/15 1637    Education provided Yes   Education Details HEP, strengthening, coordination.   Person(s) Educated Patient   Methods Explanation;Demonstration;Verbal cues   Comprehension Verbal cues required;Returned demonstration;Verbalized understanding             OT Long Term Goals - 12/23/15 1349    OT LONG TERM GOAL #1   Title Patient will be able to hold objects overhead for 30 secs for inspection and repeat up to 10 times for work related task.     Baseline unable at eval   Time 12   Period Weeks   Status New   OT LONG TERM GOAL #2   Title Patient will be able to lift 10# tote as simulated work task with modified independence and move from one surface to another.    Baseline unable at eval    Time 12   Period Weeks   Status New   OT LONG TERM GOAL #3   Title Patient will don pants with modified independence with good balance.   Baseline CGA to min assist   Time 12   Period Weeks   Status New   OT LONG TERM GOAL #4   Title Patient will improve grip strength on right by 10# to assist with opening jars and containers with modified independence.    Baseline 34# at eval   Time 12   Period Weeks   Status New   OT LONG TERM GOAL #5   Title Patient will improve coordination and strength to be able to don and fasten her bra with modified independence.    Baseline min assist   Time 12   Period Weeks   Status New   Long Term Additional Goals   Additional Long Term Goals Yes   OT LONG TERM GOAL  #6   Title Patient will increase strength by 1 mm grade to lift and pour a cup of coffee from full carafe.     Baseline unable, shaky   Time 12   Period Weeks   Status New               Plan - 12/23/15 1640    Clinical Impression Statement Patient arrived a few minutes late this date for session.  She fatigued quickly with higher weight for strengthening, therefore therapist alternated weights.  Able to manipulate coins from tabletop and place into resistive bank with effort and cues.  Will continue to work towards improving strength and coordination for increased independence in daily tasks.    Pt will benefit from skilled therapeutic intervention in order to improve on the following deficits (Retired) Abnormal gait;Impaired vision/preception;Decreased knowledge of use of DME;Decreased strength;Decreased balance;Difficulty walking;Decreased range of motion;Pain;Decreased coordination;Impaired UE functional use   OT Frequency 2x / week   OT Duration 12 weeks   OT Treatment/Interventions Self-care/ADL training;Therapeutic exercise;Functional Mobility Training;Patient/family education;Neuromuscular education;Manual Therapy;Balance training;Therapeutic exercises;DME and/or AE instruction   Consulted and Agree with Plan of Care Patient        Problem List Patient Active Problem List   Diagnosis Date Noted  . CVA (cerebral infarction) 11/07/2015   Autumn Arnold, OTR/L, CLT  Autumn Arnold 12/23/2015, 4:44 PM  [place] MAIN [facility] SERVICES [address] [place], [place], [address] Phone: [phone]   Fax:  [phone]  Name: Autumn Arnold MRN: [id number] Date of Birth: [birth year]

## 2015-12-27 ENCOUNTER — Ambulatory Visit: Payer: BLUE CROSS/BLUE SHIELD | Admitting: Physical Therapy

## 2015-12-27 ENCOUNTER — Ambulatory Visit: Payer: BLUE CROSS/BLUE SHIELD | Admitting: Occupational Therapy

## 2015-12-27 ENCOUNTER — Encounter: Payer: Self-pay | Admitting: Physical Therapy

## 2015-12-27 DIAGNOSIS — Z741 Need for assistance with personal care: Secondary | ICD-10-CM

## 2015-12-27 DIAGNOSIS — R262 Difficulty in walking, not elsewhere classified: Secondary | ICD-10-CM | POA: Diagnosis not present

## 2015-12-27 DIAGNOSIS — IMO0002 Reserved for concepts with insufficient information to code with codable children: Secondary | ICD-10-CM

## 2015-12-27 DIAGNOSIS — R460 Very low level of personal hygiene: Secondary | ICD-10-CM

## 2015-12-27 NOTE — Therapy (Signed)
[place] MAIN [facility] SERVICES [address] [place], [place], [address] Phone: [phone]   Fax:  [phone]  Physical Therapy Treatment  Patient Details  Name: Autumn Arnold MRN: [id number] Date of Birth: 05-Jul-1951 Referring Provider: Netty Starring  Encounter Date: 12/27/2015      PT End of Session - 12/27/15 1148    Visit Number 8   Number of Visits 25   Date for PT Re-Evaluation February 27, 2016   Authorization Type g codes   PT Start Time 02-10-14   PT Stop Time 1200   PT Time Calculation (min) 45 min   Activity Tolerance Patient tolerated treatment well   Behavior During Therapy [facility] for tasks assessed/performed      Past Medical History  Diagnosis Date  . Diabetes mellitus Feb 11, 2007  . Hyperlipidemia   . Hypertension   . Stroke [facility]) 02/10/2010    headache, left arm numbness  . Sleep apnea     CPAP at [facility], Dr. Humphrey Rolls    Past Surgical History  Procedure Laterality Date  . Anterior fusion cervical spine    . Elbow surgery      Tendonitis  . Carpal tunnel release      Bilateral  . Incontinence surgery    . Abdominal hysterectomy    . Tonsillectomy    . Cesarean section      There were no vitals filed for this visit.  Visit Diagnosis:  Weakness due to cerebrovascular accident  Lack of coordination due to stroke  Difficulty walking      Subjective Assessment - 12/27/15 1137    Subjective Patient says that her left side is painful. She is trying to do more walking without her loftstrand crutch. She is having 6/10 pain in her left hip.    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 5 minutes   How long can you walk comfortably? 10-15 minutes   Patient Stated Goals Patient wants to be able to walk better.    Pain Score 6    Pain Location Hip   Pain Orientation  Left    manual therapy to left hip long axis distraction 30 bouts x 10 x 2 sets grade 3 and 4. THER-EX Nustep L3 x 5 minutes for warm-up during history (5 minutes unbilled); Quantum double leg press 105# x 10, 120# x 10, 135# x 10; Heel raises with UE support and toes on 2x4, 2 x 10; Mini squats with RTB around knees to prevent valgus 2 x 10; RTB side stepping in // bars 4 lengths x 2; Sit to stand without UE support RTB around knees to prevent valgus 2 x 10  Patient has discomfort in her left hip that usually goes down to her toes that is 6/10 . She responded to long axis distraction with 0/10 pain.                            PT Education - 12/27/15 1139    Education provided Yes   Education Details strengthening   Person(s) Educated Patient   Methods Explanation   Comprehension Verbalized understanding             PT Long Term Goals - 11/30/15 0915    PT LONG TERM GOAL #1  Title Patient will be independent in home exercise program to improve strength/mobility for better functional independence with ADLs.   Time 12   Period Weeks   Status New   PT LONG TERM GOAL #2   Title Patient (> 75 years old) will complete five times sit to stand test in < 15 seconds indicating an increased LE strength and improved balance   Time 12   Period Weeks   Status New   PT LONG TERM GOAL #3   Title Patient will increase six minute walk test distance to >1000 for progression to community ambulator and improve gait ability   Time 12   Period Weeks   Status New   PT LONG TERM GOAL #4   Title Patient will reduce timed up and go to <11 seconds to reduce fall risk and demonstrate improved transfer/gait ability   Time 12   Period Weeks   Status New               Plan - 12/27/15 1148    Clinical Impression Statement Patient responded to long axis distraction to left hip to decrease pain from 6/10 to 0/10. She was able to perform LE exercises following manualt  therapy,   Pt will benefit from skilled therapeutic intervention in order to improve on the following deficits Abnormal gait;Decreased coordination;Difficulty walking;Decreased endurance;Impaired vision/preception;Decreased activity tolerance;Decreased balance;Decreased scar mobility;Impaired sensation;Decreased strength   Rehab Potential Fair   Clinical Impairments Affecting Rehab Potential numbness, weakness, loftstrand crutch, diabetes, circulation problems, left eye blurry  : patients clinical presentation is stable   PT Frequency 2x / week   PT Duration 12 weeks   PT Treatment/Interventions Therapeutic exercise;Therapeutic activities;Stair training;Gait training;Balance training;Neuromuscular re-education   PT Next Visit Plan balance and strengthening   PT Home Exercise Plan LE exericses   Consulted and Agree with Plan of Care Patient;Family member/caregiver   Family Member Consulted husband        Problem List Patient Active Problem List   Diagnosis Date Noted  . CVA (cerebral infarction) 11/07/2015   Alanson Puls, PT, DPT [place] 12/27/2015, 11:50 AM  [place] MAIN [facility] SERVICES [address] [place], [place], [address] Phone: [phone]   Fax:  [phone]  Name: Autumn Arnold MRN: [id number] Date of Birth: [birth year]

## 2015-12-28 ENCOUNTER — Encounter: Payer: Self-pay | Admitting: Occupational Therapy

## 2015-12-28 NOTE — Therapy (Signed)
Autumn Arnold [facility] SERVICES [address] [place], [place], [address] Phone: [phone]   Fax:  [phone]  Occupational Therapy Treatment  Patient Details  Name: Autumn Arnold MRN: [id number] Date of Birth: 1951-01-06 No Data Recorded  Encounter Date: 12/27/2015      OT End of Session - 12/28/15 1603    Visit Number 3   Number of Visits 24   Date for OT Re-Evaluation 03/13/16   OT Start Time 1036   OT Stop Time 1115   OT Time Calculation (min) 39 min   Activity Tolerance Patient tolerated treatment well   Behavior During Therapy [facility] for tasks assessed/performed      Past Medical History  Diagnosis Date  . Diabetes mellitus 2008  . Hyperlipidemia   . Hypertension   . Stroke [facility]) 02/2010    headache, left arm numbness  . Sleep apnea     CPAP at [facility], Dr. Humphrey Rolls    Past Surgical History  Procedure Laterality Date  . Anterior fusion cervical spine    . Elbow surgery      Tendonitis  . Carpal tunnel release      Bilateral  . Incontinence surgery    . Abdominal hysterectomy    . Tonsillectomy    . Cesarean section      There were no vitals filed for this visit.  Visit Diagnosis:  Weakness due to cerebrovascular accident  Lack of coordination due to stroke  Self-care deficit for dressing and grooming      Subjective Assessment - 12/28/15 1555    Subjective  Patient reports her back is hurting today, 5/10 pain this date.   Patient Stated Goals Patient wants to be able to have full use of her arm and leg to be able to take care of herself and be independent.     Currently in Pain? Yes   Pain Score 5    Pain Location Back   Pain Descriptors / Indicators Aching   Pain Onset Today   Multiple Pain Sites No                      OT Treatments/Exercises (OP) - 12/27/15 1556    Fine Motor Coordination   Other Fine Motor Exercises Patient seen for knotting and unknotting exercises with use of bilateral UEs  with cues.  Manipulation of nuts and bolts on elevated surface with right hand and cues.     Neurological Re-education Exercises   Other Weight-Bearing Exercises 1 Patient seen for UBE for 5 minutes with resistance of 1.5 to 2.4 with therapist in constant attendance to adjust settings and monitor patient's grip.  Grip strength with right hand 11.2# for 25 reps for 2 sets each.  Red theraband for shoulder ABD.for 10 reps, switched to yellow theraband and completed diagonal patterns, elbow flexion/ext for 10 reps each.     Sensation Exercises   Stereognosis intact                     OT Long Term Goals - 12/23/15 1349    OT LONG TERM GOAL #1   Title Patient will be able to hold objects overhead for 30 secs for inspection and repeat up to 10 times for work related task.     Baseline unable at eval   Time 12   Period Weeks   Status New   OT LONG TERM GOAL #2   Title Patient will be  able to lift 10# tote as simulated work task with modified independence and move from one surface to another.    Baseline unable at eval    Time 12   Period Weeks   Status New   OT LONG TERM GOAL #3   Title Patient will don pants with modified independence with good balance.   Baseline CGA to min assist   Time 12   Period Weeks   Status New   OT LONG TERM GOAL #4   Title Patient will improve grip strength on right by 10# to assist with opening jars and containers with modified independence.    Baseline 34# at eval   Time 12   Period Weeks   Status New   OT LONG TERM GOAL #5   Title Patient will improve coordination and strength to be able to don and fasten her bra with modified independence.    Baseline min assist   Time 12   Period Weeks   Status New   Long Term Additional Goals   Additional Long Term Goals Yes   OT LONG TERM GOAL #6   Title Patient will increase strength by 1 mm grade to lift and pour a cup of coffee from full carafe.     Baseline unable, shaky   Time 12   Period  Weeks   Status New               Plan - 12/28/15 1605    Clinical Impression Statement Patient limited by back pain this date and recommended she talk with PT regarding her increased pain this date during her session.  She required cues for exercises for form and technique and to control the movements.  She had some difficulty with thumb finger combos with manipulation of nuts and bolts especially with smaller ones.    Pt will benefit from skilled therapeutic intervention in order to improve on the following deficits (Retired) Abnormal gait;Impaired vision/preception;Decreased knowledge of use of DME;Decreased strength;Decreased balance;Difficulty walking;Decreased range of motion;Pain;Decreased coordination;Impaired UE functional use   Rehab Potential Good   OT Frequency 2x / week   OT Duration 12 weeks   OT Treatment/Interventions Self-care/ADL training;Therapeutic exercise;Functional Mobility Training;Patient/family education;Neuromuscular education;Manual Therapy;Balance training;Therapeutic exercises;DME and/or AE instruction   Consulted and Agree with Plan of Care Patient        Problem List Patient Active Problem List   Diagnosis Date Noted  . CVA (cerebral infarction) 11/07/2015   Autumn Arnold T Autumn Arnold, OTR/L, CLT  Autumn Arnold 12/28/2015, 4:10 PM  Autumn Arnold [facility] SERVICES [address] [place], [place], [address] Phone: [phone]   Fax:  [phone]  Name: Autumn Arnold MRN: [id number] Date of Birth: Feb 26, 1951

## 2015-12-29 ENCOUNTER — Ambulatory Visit: Payer: BLUE CROSS/BLUE SHIELD | Admitting: Occupational Therapy

## 2015-12-29 ENCOUNTER — Ambulatory Visit: Payer: BLUE CROSS/BLUE SHIELD | Admitting: Physical Therapy

## 2015-12-29 ENCOUNTER — Encounter: Payer: Self-pay | Admitting: Physical Therapy

## 2015-12-29 DIAGNOSIS — R531 Weakness: Secondary | ICD-10-CM

## 2015-12-29 DIAGNOSIS — R279 Unspecified lack of coordination: Secondary | ICD-10-CM

## 2015-12-29 DIAGNOSIS — R262 Difficulty in walking, not elsewhere classified: Secondary | ICD-10-CM | POA: Diagnosis not present

## 2015-12-29 DIAGNOSIS — IMO0002 Reserved for concepts with insufficient information to code with codable children: Secondary | ICD-10-CM

## 2015-12-29 NOTE — Therapy (Addendum)
[place] MAIN [facility] SERVICES [address] [place], [place], [address] Phone: [phone]   Fax:  [phone]  Physical Therapy Treatment  Patient Details  Name: Autumn Arnold MRN: [id number] Date of Birth: 1950-11-11 Referring Provider: Netty Starring  Encounter Date: 12/29/2015      PT End of Session - 12/29/15 1058    Visit Number 10   Number of Visits 25   Date for PT Re-Evaluation Mar 09, 2016   Authorization Type g codes   PT Start Time 21-Feb-1114   PT Stop Time 1155   PT Time Calculation (min) 40 min   Activity Tolerance Patient tolerated treatment well   Behavior During Therapy [facility] for tasks assessed/performed      Past Medical History  Diagnosis Date  . Diabetes mellitus 02/22/2007  . Hyperlipidemia   . Hypertension   . Stroke [facility]) 02-21-10    headache, left arm numbness  . Sleep apnea     CPAP at [facility], Dr. Humphrey Rolls    Past Surgical History  Procedure Laterality Date  . Anterior fusion cervical spine    . Elbow surgery      Tendonitis  . Carpal tunnel release      Bilateral  . Incontinence surgery    . Abdominal hysterectomy    . Tonsillectomy    . Cesarean section      There were no vitals filed for this visit.  Visit Diagnosis:  Weakness due to cerebrovascular accident  Lack of coordination due to stroke  Difficulty walking      Subjective Assessment - 12/29/15 1057    Subjective Patient says that her left side is painful. She is trying to do more walking without her loftstrand crutch. She is having less pain in her left hip.    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 5 minutes   How long can you walk comfortably? 10-15 minutes   Patient Stated Goals Patient wants to be able to walk better.    Pain Onset Today    NEUROMUSCULAR RE-EDUCATION Airex  NBOS eyes open/closed x 30 seconds each; Airex NBOS eyes open horizontal and vertical head turns x 30 seconds; Airex cone  reaching crossing midline  Toe tapping 6 inch stool without UE assist Tandem gait in // bars x 4 laps  Side stepping on blue  foam balance beam x 5 lengths of the parallel bars  4 square fwd/bwd, side to side stepping/ diagonal stepping Standing on foam NBOS  sorting balls/ sorting shapes reaching  Therapeutic exercise; Leg press x 20 x 3 130 lbs,  heel raises with 90 lbs x 20 x 3 Heel raises standing  4 way hip RTB x 20   OUTCOME MEASURES: TEST Outcome Interpretation  5 times sit<>stand 15.80 sec >60 yo, >15 sec indicates increased risk for falls  10 meter walk test  . 90 m/s <1.0 m/s indicates increased risk for falls; limited community ambulator  Timed up and Go 13. 53 sec <14 sec indicates increased risk for falls          CGA and Min to mod verbal cues used throughout with increased in postural sway and LOB most seen with narrow base of support and while on uneven surfaces. Continues to have balance deficits typical with diagnosis. Patient performs intermediate level exercises  without pain behaviors and needs verbal cuing for postural alignment and head positioning Tactile cues and assistance needed to keep lower leg and knee in neutral to avoid compensations with ankle motions.                             PT Education - 12/29/15 1057    Education provided Yes   Education Details strengthening   Person(s) Educated Patient   Methods Explanation   Comprehension Verbalized understanding             PT Long Term Goals - 11/30/15 0915    PT LONG TERM GOAL #1   Title Patient will be independent in home exercise program to improve strength/mobility for better functional independence with ADLs.   Time 12   Period Weeks   Status New   PT LONG TERM GOAL #2   Title Patient (> 75 years old)  will complete five times sit to stand test in < 15 seconds indicating an increased LE strength and improved balance   Time 12   Period Weeks   Status New   PT LONG TERM GOAL #3   Title Patient will increase six minute walk test distance to >1000 for progression to community ambulator and improve gait ability   Time 12   Period Weeks   Status New   PT LONG TERM GOAL #4   Title Patient will reduce timed up and go to <11 seconds to reduce fall risk and demonstrate improved transfer/gait ability   Time 12   Period Weeks   Status New               Plan - 12/29/15 1125    Clinical Impression Statement  Demonstrates increased weakness in RLE>LLE upon descent        Problem List Patient Active Problem List   Diagnosis Date Noted  . CVA (cerebral infarction) 11/07/2015  Alanson Puls, PT, DPT  [place] S 12/29/2015, 11:27 AM  [place] MAIN [facility] SERVICES [address] [place], [place], [address] Phone: [phone]   Fax:  [phone]  Name: Autumn Arnold MRN: [id number] Date of Birth: 08/27/1951

## 2015-12-29 NOTE — Therapy (Signed)
[place] MAIN [facility] SERVICES [address] [place], [place], [address] Phone: [phone]   Fax:  [phone]  Occupational Therapy Treatment  Patient Details  Name: Autumn Arnold MRN: [id number] Date of Birth: 15-Dec-1950 No Data Recorded  Encounter Date: 12/29/2015      OT End of Session - 12/29/15 1303    Visit Number 4   Number of Visits 24   Date for OT Re-Evaluation 03/13/16   OT Start Time 1038   OT Stop Time 1117   OT Time Calculation (min) 39 min      Past Medical History  Diagnosis Date  . Diabetes mellitus 2008  . Hyperlipidemia   . Hypertension   . Stroke [facility]) 02/2010    headache, left arm numbness  . Sleep apnea     [facility]AP at [facility], Dr. Humphrey Rolls    Past Surgical History  Procedure Laterality Date  . Anterior fusion cervical spine    . Elbow surgery      Tendonitis  . Carpal tunnel release      Bilateral  . Incontinence surgery    . Abdominal hysterectomy    . Tonsillectomy    . Cesarean section      There were no vitals filed for this visit.  Visit Diagnosis:  Weakness generalized  Lack of coordination      Subjective Assessment - 12/29/15 1135    Subjective  Pt. reports her ankle and back are hurting today.   Patient is accompained by: Family member   Pertinent History Pt. presents with RUE weakness and incoordination following a CVA.    Patient Stated Goals Patient wants to be able to have full use of her arm and leg to be able to take care of herself and be independent.     Currently in Pain? Yes   Pain Score 8    Pain Location Shoulder   Pain Orientation Left   Pain Onset Today   Aggravating Factors  During UBE. Pt. also c/o back and ankle pain.     Multiple Pain Sites Yes                      OT Treatments/Exercises (OP) - 12/29/15 0001    Fine Motor Coordination   Other Fine Motor Exercises Pt. Worked on untying knots using right hand. She was able to untie multiple knots with  increased time on various graded sizes of knots.    Neurological Re-education Exercises   Other Exercises 1 Pt. tolerated the UBE for 5 min. Seated at 1.5-2.4 with constant monitoring of the right hand. Occasional repositioning of the hand was required. Pt. Performed UE ther. Ex with 2# dumbbell ex. for elbow flexion and extension, 2# for forearm supination/pronation, wrist flexion/extension, and radial deviation. Pt. requires rest breaks and verbal cues for proper technique.   Other Exercises 2 Pt. performed gross gripping with grip strengthener. Pt. worked on sustaining grip while grasping pegs and reaching at various heights. Performed in sitting. Pt. performed resistive EZ Board exercises for forearm supination/pronation, wrist flexion/extension using gross grasp, and lateral pinch (key) grasp. Resistive EZ Board exercises angled in several planes were performed to promote shoulder flexion, abduction, and wrist flexion, and extension while performing resistive wrist flexion and extension with a gross grip, lateral pinch, and grasping circular dials.                 OT Education - 12/29/15 1302    Education  Details UE strengthening exercises, positioning of UE during exercises, coordination   Person(s) Educated Patient   Methods Explanation   Comprehension Verbalized understanding;Returned demonstration;Verbal cues required;Tactile cues required             OT Long Term Goals - 12/23/15 1349    OT LONG TERM GOAL #1   Title Patient will be able to hold objects overhead for 30 secs for inspection and repeat up to 10 times for work related task.     Baseline unable at eval   Time 12   Period Weeks   Status New   OT LONG TERM GOAL #2   Title Patient will be able to lift 10# tote as simulated work task with modified independence and move from one surface to another.    Baseline unable at eval    Time 12   Period Weeks   Status New   OT LONG TERM GOAL #3   Title Patient will  don pants with modified independence with good balance.   Baseline CGA to min assist   Time 12   Period Weeks   Status New   OT LONG TERM GOAL #4   Title Patient will improve grip strength on right by 10# to assist with opening jars and containers with modified independence.    Baseline 34# at eval   Time 12   Period Weeks   Status New   OT LONG TERM GOAL #5   Title Patient will improve coordination and strength to be able to don and fasten her bra with modified independence.    Baseline min assist   Time 12   Period Weeks   Status New   Long Term Additional Goals   Additional Long Term Goals Yes   OT LONG TERM GOAL #6   Title Patient will increase strength by 1 mm grade to lift and pour a cup of coffee from full carafe.     Baseline unable, shaky   Time 12   Period Weeks   Status New               Plan - 12/29/15 1313    Clinical Impression Statement Pt. Is making progress, however continues to require work on improving RUE strength, coordination, and hand function for use during ADL and IADL tasks. Pt. Required cues for the positioning of the RUE, and proper technique while on the UBE, EZ Board, with the dumbbell ex, and while using bilateral hands for untying various size knots.   Pt will benefit from skilled therapeutic intervention in order to improve on the following deficits (Retired) Abnormal gait;Impaired vision/preception;Decreased knowledge of use of DME;Decreased strength;Decreased balance;Difficulty walking;Decreased range of motion;Pain;Decreased coordination;Impaired UE functional use   Rehab Potential Good   OT Frequency 2x / week   OT Duration 12 weeks   OT Treatment/Interventions Self-care/ADL training;Therapeutic exercise;Functional Mobility Training;Patient/family education;Neuromuscular education;Manual Therapy;Balance training;Therapeutic exercises;DME and/or AE instruction   Plan Continue with POC to focus on improving UE strength, coordination. and  hand function.        Problem List Patient Active Problem List   Diagnosis Date Noted  . CVA (cerebral infarction) 11/07/2015   Harrel Carina, MS, OTR/L   Harrel Carina 12/29/2015, 1:19 PM  [place] MAIN [facility] SERVICES [address] [place], [place], [address] Phone: [phone]   Fax:  [phone]  Name: Autumn Arnold MRN: [id number] Date of Birth: Mar 06, 1951

## 2015-12-29 NOTE — Patient Instructions (Addendum)
  Pt. Is making progress, however continues to work on improving RUE strength, coordination, and hand function for use during ADL and IADL tasks. Pt. Required cues for the positioning of the RUE while on the UBE, [place], using the dumbbell, and while using bilateral hands for untying various size knots.

## 2016-01-03 ENCOUNTER — Encounter: Payer: Self-pay | Admitting: Physical Therapy

## 2016-01-03 ENCOUNTER — Ambulatory Visit: Payer: BLUE CROSS/BLUE SHIELD | Admitting: Physical Therapy

## 2016-01-03 ENCOUNTER — Ambulatory Visit: Payer: BLUE CROSS/BLUE SHIELD | Admitting: Occupational Therapy

## 2016-01-03 DIAGNOSIS — R279 Unspecified lack of coordination: Secondary | ICD-10-CM

## 2016-01-03 DIAGNOSIS — Z789 Other specified health status: Secondary | ICD-10-CM

## 2016-01-03 DIAGNOSIS — R6889 Other general symptoms and signs: Secondary | ICD-10-CM

## 2016-01-03 DIAGNOSIS — Z741 Need for assistance with personal care: Secondary | ICD-10-CM

## 2016-01-03 DIAGNOSIS — R531 Weakness: Secondary | ICD-10-CM

## 2016-01-03 DIAGNOSIS — R262 Difficulty in walking, not elsewhere classified: Secondary | ICD-10-CM | POA: Diagnosis not present

## 2016-01-03 DIAGNOSIS — IMO0002 Reserved for concepts with insufficient information to code with codable children: Secondary | ICD-10-CM

## 2016-01-03 DIAGNOSIS — R460 Very low level of personal hygiene: Secondary | ICD-10-CM

## 2016-01-03 NOTE — Therapy (Addendum)
[place] MAIN [facility] SERVICES [address] [place], [place], [address] Phone: [phone]   Fax:  [phone]  Physical Therapy Treatment  Patient Details  Name: Autumn Arnold MRN: [id number] Date of Birth: Jan 09, 1951 Referring Provider: Netty Starring  Encounter Date: 01/03/2016      PT End of Session - 01/03/16 1126    Visit Number 11   Number of Visits 25   Date for PT Re-Evaluation March 02, 2016   Authorization Type g codes   PT Start Time 1120   PT Stop Time 1200   PT Time Calculation (min) 40 min   Equipment Utilized During Treatment Gait belt   Activity Tolerance Patient limited by pain      Past Medical History  Diagnosis Date  . Diabetes mellitus 2008  . Hyperlipidemia   . Hypertension   . Stroke [facility]) 02/2010    headache, left arm numbness  . Sleep apnea     CPAP at [facility], Dr. Humphrey Rolls    Past Surgical History  Procedure Laterality Date  . Anterior fusion cervical spine    . Elbow surgery      Tendonitis  . Carpal tunnel release      Bilateral  . Incontinence surgery    . Abdominal hysterectomy    . Tonsillectomy    . Cesarean section      There were no vitals filed for this visit.  Visit Diagnosis:  Weakness generalized  Lack of coordination  Weakness due to cerebrovascular accident  Difficulty walking      Subjective Assessment - 01/03/16 1124    Subjective Patient says that her right ankle is painful, like stepping on a nail and her right knee is locking up. She is having pulling in  her left hip.  She is trying to do more walking without her loftstrand crutch. She is having less pain in her left hip.    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 5 minutes   How long can you walk comfortably? 10-15 minutes   Patient Stated Goals Patient  wants to be able to walk better.    Pain Onset In the past 7 days   Pain Onset In the past 7 days     Gait training on tread mill with UE support x 10 minutes, fwd/bwd stepping in parallel bars and side stepping 10 x 6 THER-EX Nustep L3 x 5 minutes for warm-up during history (3 minutes unbilled); Quantum double leg press 100 # 20 x 3 Heel raises with UE support and toes on 2x4, 2 x 10; Mini squats x 20  side stepping in // bars 4 lengths x 2; Sit to stand without UE support  CGA and Min to mod verbal cues used throughout with increased in postural sway and LOB most seen with narrow base of support and while on uneven surfaces. Continues to have balance deficits typical with diagnosis. Patient performs intermediate level exercises with pain behaviors and needs verbal cuing for postural alignment and head positioning Tactile cues and assistance needed to keep lower leg and knee in neutral to avoid compensations with ankle motions.   G codes: Current 8978 40-60 % CK  Goal  8979 20-40 % CJ Patient is improving with ambulation with loftstrand crutch on level and uneven surfaces  PT Education - 01/03/16 1125    Education provided Yes   Education Details Standing balance exercises and  strengthening exercises for BLE.   Person(s) Educated Patient   Methods Explanation   Comprehension Verbalized understanding             PT Long Term Goals - 11/30/15 0915    PT LONG TERM GOAL #1   Title Patient will be independent in home exercise program to improve strength/mobility for better functional independence with ADLs.   Time 12   Period Weeks   Status New   PT LONG TERM GOAL #2   Title Patient (> 88 years old) will complete five times sit to stand test in < 15 seconds indicating an increased LE strength and improved balance   Time 12   Period Weeks   Status New   PT LONG TERM GOAL #3   Title Patient will increase six minute walk test  distance to >1000 for progression to community ambulator and improve gait ability   Time 12   Period Weeks   Status New   PT LONG TERM GOAL #4   Title Patient will reduce timed up and go to <11 seconds to reduce fall risk and demonstrate improved transfer/gait ability   Time 12   Period Weeks   Status New               Plan - 01/03/16 1127    Clinical Impression Statement PT provided min - verbal instruction to improve set up, proper use of LE, and improved posture and gait mechanics. Patient responded well  to instruction.   Pt will benefit from skilled therapeutic intervention in order to improve on the following deficits Abnormal gait;Decreased coordination;Difficulty walking;Decreased endurance;Impaired vision/preception;Decreased activity tolerance;Decreased balance;Decreased scar mobility;Impaired sensation;Decreased strength   Rehab Potential Fair   Clinical Impairments Affecting Rehab Potential numbness, weakness, loftstrand crutch, diabetes, circulation problems, left eye blurry  : patients clinical presentation is stable   PT Frequency 2x / week   PT Duration 12 weeks   PT Treatment/Interventions Therapeutic exercise;Therapeutic activities;Stair training;Gait training;Balance training;Neuromuscular re-education        Problem List Patient Active Problem List   Diagnosis Date Noted  . CVA (cerebral infarction) 11/07/2015  Alanson Puls, PT, DPT  [place] S 01/03/2016, 11:30 AM  [place] MAIN [facility] SERVICES [address] [place], [place], [address] Phone: [phone]   Fax:  [phone]  Name: Autumn Arnold MRN: [id number] Date of Birth: [birth year]

## 2016-01-03 NOTE — Therapy (Signed)
[place] MAIN [facility] SERVICES [address] [place], [place], [address] Phone: [phone]   Fax:  [phone]  Occupational Therapy Treatment  Patient Details  Name: Autumn Arnold MRN: [id number] Date of Birth: 04/16/1951 No Data Recorded  Encounter Date: 01/03/2016      OT End of Session - 01/03/16 1653    Visit Number 5   Number of Visits 24   Date for OT Re-Evaluation 03/13/16   OT Start Time 1038   OT Stop Time 1120   OT Time Calculation (min) 42 min   Activity Tolerance Patient tolerated treatment well   Behavior During Therapy [facility] for tasks assessed/performed      Past Medical History  Diagnosis Date  . Diabetes mellitus 2008  . Hyperlipidemia   . Hypertension   . Stroke [facility]) 02/2010    headache, left arm numbness  . Sleep apnea     CPAP at [facility], Dr. Humphrey Rolls    Past Surgical History  Procedure Laterality Date  . Anterior fusion cervical spine    . Elbow surgery      Tendonitis  . Carpal tunnel release      Bilateral  . Incontinence surgery    . Abdominal hysterectomy    . Tonsillectomy    . Cesarean section      There were no vitals filed for this visit.  Visit Diagnosis:  Weakness generalized  Lack of coordination due to stroke  Self-care deficit for dressing and grooming  Impaired instrumental activities of daily living (IADL)      Subjective Assessment - 01/03/16 1044    Subjective  Patient reports the feeling is coming back in her ankle, been hurting.  Back is still bothering her although she got some temporary relief last time.    Patient Stated Goals Patient wants to be able to have full use of her arm and leg to be able to take care of herself and be independent.     Currently in Pain? Yes   Pain Score 6    Pain Location Back   Pain Orientation Mid;Left   Pain Descriptors / Indicators Stabbing   Pain Type Acute pain   Pain Onset In the past 7 days   Multiple Pain Sites Yes   Pain Score 3   Pain  Location Ankle   Pain Orientation Right   Pain Descriptors / Indicators Sharp;Nagging   Pain Type Acute pain   Pain Onset In the past 7 days                      OT Treatments/Exercises (OP) - 01/03/16 1649    Fine Motor Coordination   Other Fine Motor Exercises Patient seen this date for manipulation of small 1/2 inch dowels and washers to place onto dowels in various planes of motion, cues for coordination and pinch patterns.  Manipulation of cards for shuffling with bilateral hands, thumb finger combinations for flipping cards with use of right hand.     Neurological Re-education Exercises   Other Exercises 1 Patient seen for 2# dowel exercises for shoulder flexion, ABD, ADD, chest press, forwards/backwards circles for 10 reps for 1 set each.  Grip strength on right for 17# for 10 reps, 23# for 7 reps.  Pinch pins, yellow to blue placed onto elevated surface with right hand for lateral pinch and 3 point pinch, place and remove.  OT Education - 01/03/16 1653    Education provided Yes   Education Details HEP, coordination exercises.   Person(s) Educated Patient   Methods Verbal cues;Demonstration;Explanation   Comprehension Verbalized understanding;Returned demonstration;Verbal cues required             OT Long Term Goals - 12/23/15 1349    OT LONG TERM GOAL #1   Title Patient will be able to hold objects overhead for 30 secs for inspection and repeat up to 10 times for work related task.     Baseline unable at eval   Time 12   Period Weeks   Status New   OT LONG TERM GOAL #2   Title Patient will be able to lift 10# tote as simulated work task with modified independence and move from one surface to another.    Baseline unable at eval    Time 12   Period Weeks   Status New   OT LONG TERM GOAL #3   Title Patient will don pants with modified independence with good balance.   Baseline CGA to min assist   Time 12   Period Weeks    Status New   OT LONG TERM GOAL #4   Title Patient will improve grip strength on right by 10# to assist with opening jars and containers with modified independence.    Baseline 34# at eval   Time 12   Period Weeks   Status New   OT LONG TERM GOAL #5   Title Patient will improve coordination and strength to be able to don and fasten her bra with modified independence.    Baseline min assist   Time 12   Period Weeks   Status New   Long Term Additional Goals   Additional Long Term Goals Yes   OT LONG TERM GOAL #6   Title Patient will increase strength by 1 mm grade to lift and pour a cup of coffee from full carafe.     Baseline unable, shaky   Time 12   Period Weeks   Status New               Plan - 01/03/16 1653    Clinical Impression Statement Patient reports some back pain today and concentrated around scapula area at times.  Added shoulder retraction to exercises this date.  Patient continues to progress with strength and coordination of the right UE and showing improvement with skills to complete daily necessary tasks.    Pt will benefit from skilled therapeutic intervention in order to improve on the following deficits (Retired) Abnormal gait;Impaired vision/preception;Decreased knowledge of use of DME;Decreased strength;Decreased balance;Difficulty walking;Decreased range of motion;Pain;Decreased coordination;Impaired UE functional use   Rehab Potential Good   OT Frequency 2x / week   OT Duration 12 weeks   OT Treatment/Interventions Self-care/ADL training;Therapeutic exercise;Functional Mobility Training;Patient/family education;Neuromuscular education;Manual Therapy;Balance training;Therapeutic exercises;DME and/or AE instruction   Consulted and Agree with Plan of Care Patient        Problem List Patient Active Problem List   Diagnosis Date Noted  . CVA (cerebral infarction) 11/07/2015   Michelle Wnek T Tomasita Morrow, OTR/L, CLT  [name] 01/03/2016, 4:56 PM  [place] MAIN [facility] SERVICES [address] [place], [place], [address] Phone: [phone]   Fax:  [phone]  Name: Autumn Arnold MRN: [id number] Date of Birth: [birth year]

## 2016-01-05 ENCOUNTER — Encounter: Payer: Self-pay | Admitting: Physical Therapy

## 2016-01-05 ENCOUNTER — Ambulatory Visit: Payer: BLUE CROSS/BLUE SHIELD | Admitting: Occupational Therapy

## 2016-01-05 ENCOUNTER — Ambulatory Visit: Payer: BLUE CROSS/BLUE SHIELD | Attending: Family Medicine | Admitting: Physical Therapy

## 2016-01-05 DIAGNOSIS — R279 Unspecified lack of coordination: Secondary | ICD-10-CM | POA: Insufficient documentation

## 2016-01-05 DIAGNOSIS — R531 Weakness: Secondary | ICD-10-CM | POA: Insufficient documentation

## 2016-01-05 DIAGNOSIS — R5381 Other malaise: Secondary | ICD-10-CM | POA: Diagnosis present

## 2016-01-05 DIAGNOSIS — R460 Very low level of personal hygiene: Secondary | ICD-10-CM | POA: Insufficient documentation

## 2016-01-05 DIAGNOSIS — R202 Paresthesia of skin: Secondary | ICD-10-CM | POA: Insufficient documentation

## 2016-01-05 DIAGNOSIS — I6980 Unspecified sequelae of other cerebrovascular disease: Secondary | ICD-10-CM | POA: Diagnosis not present

## 2016-01-05 DIAGNOSIS — IMO0002 Reserved for concepts with insufficient information to code with codable children: Secondary | ICD-10-CM

## 2016-01-05 DIAGNOSIS — R200 Anesthesia of skin: Secondary | ICD-10-CM

## 2016-01-05 DIAGNOSIS — R262 Difficulty in walking, not elsewhere classified: Secondary | ICD-10-CM | POA: Diagnosis present

## 2016-01-05 NOTE — Therapy (Signed)
[place] MAIN [facility] SERVICES [address] [place], [place], [address] Phone: [phone]   Fax:  [phone]  Occupational Therapy Treatment  Patient Details  Name: Autumn Arnold MRN: [id number] Date of Birth: 1951/10/18 No Data Recorded  Encounter Date: 01/05/2016      OT End of Session - 01/05/16 1136    Visit Number 6   Number of Visits 24   Date for OT Re-Evaluation 03/13/16   OT Start Time 1038   OT Stop Time 1116   OT Time Calculation (min) 38 min      Past Medical History  Diagnosis Date  . Diabetes mellitus 2008  . Hyperlipidemia   . Hypertension   . Stroke [facility]) 02/2010    headache, left arm numbness  . Sleep apnea     CPAP at [facility], Dr. Humphrey Rolls    Past Surgical History  Procedure Laterality Date  . Anterior fusion cervical spine    . Elbow surgery      Tendonitis  . Carpal tunnel release      Bilateral  . Incontinence surgery    . Abdominal hysterectomy    . Tonsillectomy    . Cesarean section      There were no vitals filed for this visit.  Visit Diagnosis:  Lack of coordination  Weakness generalized      Subjective Assessment - 01/05/16 1041    Subjective  Pt. reports having pain in back and LE.   Pertinent History Pt. presents with RUE weakness and incoordination following a CVA.    Currently in Pain? Yes   Pain Score 4    Pain Location Back   Pain Orientation Right   Pain Descriptors / Indicators Throbbing   Pain Type Acute pain   Pain Onset More than a month ago                      OT Treatments/Exercises (OP) - 01/05/16 0001    Fine Motor Coordination   Other Fine Motor Exercises Pt. attempted to work on tasks to sustain lateral pinch on resistive tweezers while grasping and moving 2" toothpick sticks, however pt. was unable to sustain pinch. Pt. worked on pinch strengthening in the left hand for lateral and 3pt. pinch using red and green resistive clips.Tactlie and verbal cues  were required for eliciting the desired movement. Pt. worked on placing, and removing 1/2" pegs. Pt. worked storing pegs in the palm of hand when removing pegs. Pt. also worked on grasping pegs with alternating thumb opposition to 2nd through 5th digits when removing pegs. RUE strengthening, and coordination were worked on to improve UE functioning during ADL and IADL tasks.   Neurological Re-education Exercises   Other Exercises 1 Pt. performed 2# dowel ex. For UE strengthening secondary to weakness. Bilateral shoulder flexion, chest press, circular patterns, and elbow flexion/extension were performed. 2# dumbbell ex. for elbow flexion and extension,  2# for forearm supination/pronation, wrist flexion/extension, and radial deviation. Pt. requires rest breaks and verbal cues for proper technique.Pt. performed gross gripping with grip strengthener. Pt. worked on sustaining grip while grasping pegs and reaching at various heights. Performed in sitting.                OT Education - 01/05/16 1045    Education provided Yes   Education Details UE functioning, grip and pinch strength             OT Long Term Goals -  12/23/15 1349    OT LONG TERM GOAL #1   Title Patient will be able to hold objects overhead for 30 secs for inspection and repeat up to 10 times for work related task.     Baseline unable at eval   Time 12   Period Weeks   Status New   OT LONG TERM GOAL #2   Title Patient will be able to lift 10# tote as simulated work task with modified independence and move from one surface to another.    Baseline unable at eval    Time 12   Period Weeks   Status New   OT LONG TERM GOAL #3   Title Patient will don pants with modified independence with good balance.   Baseline CGA to min assist   Time 12   Period Weeks   Status New   OT LONG TERM GOAL #4   Title Patient will improve grip strength on right by 10# to assist with opening jars and containers with modified independence.     Baseline 34# at eval   Time 12   Period Weeks   Status New   OT LONG TERM GOAL #5   Title Patient will improve coordination and strength to be able to don and fasten her bra with modified independence.    Baseline min assist   Time 12   Period Weeks   Status New   Long Term Additional Goals   Additional Long Term Goals Yes   OT LONG TERM GOAL #6   Title Patient will increase strength by 1 mm grade to lift and pour a cup of coffee from full carafe.     Baseline unable, shaky   Time 12   Period Weeks   Status New               Plan - 01/05/16 1137    Clinical Impression Statement Pt. continues to work on improving RUE strength and coordination for use during ADL and IADL tasks. Pt. continues to present with weakness in RUE, grip strength, pinch strenth,  and incoordination which hinders her ability to actively use during ADL and IADL tasks.    Pt will benefit from skilled therapeutic intervention in order to improve on the following deficits (Retired) Abnormal gait;Impaired vision/preception;Decreased knowledge of use of DME;Decreased strength;Decreased balance;Difficulty walking;Decreased range of motion;Pain;Decreased coordination;Impaired UE functional use   OT Frequency 2x / week   OT Duration 12 weeks   OT Treatment/Interventions Self-care/ADL training;Therapeutic exercise;Functional Mobility Training;Patient/family education;Neuromuscular education;Manual Therapy;Balance training;Therapeutic exercises;DME and/or AE instruction   Consulted and Agree with Plan of Care Patient        Problem List Patient Active Problem List   Diagnosis Date Noted  . CVA (cerebral infarction) 11/07/2015   Harrel Carina, MS, OTR/L  Harrel Carina 01/05/2016, 11:42 AM  [place] MAIN [facility] SERVICES [address] [place], [place], [address] Phone: [phone]   Fax:  [phone]  Name: Autumn Arnold MRN: [id number] Date of Birth:  06-09-1951

## 2016-01-05 NOTE — Therapy (Signed)
[place] MAIN [facility] SERVICES [address] [place], [place], [address] Phone: [phone]   Fax:  [phone]  Physical Therapy Treatment  Patient Details  Name: Autumn Arnold MRN: [id number] Date of Birth: 1951-03-09 Referring Provider: Netty Starring  Encounter Date: 01/05/2016      PT End of Session - 01/05/16 1121    Visit Number 12   Number of Visits 25   Date for PT Re-Evaluation 02/26/2016   Authorization Type g codes   PT Start Time 02/09/14   PT Stop Time 1155   PT Time Calculation (min) 40 min   Equipment Utilized During Treatment Gait belt   Activity Tolerance Patient limited by pain      Past Medical History  Diagnosis Date  . Diabetes mellitus 02/10/07  . Hyperlipidemia   . Hypertension   . Stroke [facility]) 02-09-2010    headache, left arm numbness  . Sleep apnea     CPAP at [facility], Dr. Humphrey Rolls    Past Surgical History  Procedure Laterality Date  . Anterior fusion cervical spine    . Elbow surgery      Tendonitis  . Carpal tunnel release      Bilateral  . Incontinence surgery    . Abdominal hysterectomy    . Tonsillectomy    . Cesarean section      There were no vitals filed for this visit.  Visit Diagnosis:  Lack of coordination due to stroke  Difficulty walking  Numbness and tingling in right hand  Lack of coordination      Subjective Assessment - 01/05/16 1113    Subjective Patient is having right ankle pain and left hip pain that is intermittent in intensity.    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 5 minutes   How long can you walk comfortably? 10-15 minutes   Patient Stated Goals Patient wants to be able to walk better.    Pain Onset More than a month ago   Pain Onset In the past 7 days        THER-EX Standing exercises with 2#  ankle weights: Marching 2 x 10; SLR 2 x 10; Abduction 2 x 10; Extension 2 x 10; Knee flexion 2 x 10; Heel raises 2 x 10;  Resisted side-steeping RTB 4 lengths x 2; Standing mini squats 2 x 10 with RTB around knees to encourage abduction; Sit to stand without UE support 2 x 10; Step-ups to 6" step x 10 bilateral; Quantum leg press 75 # x 10;  NEUROMUSCULAR RE-EDUCATION Airex NBOS eyes open/closed x 30 seconds each; Airex NBOS eyes open horizontal and vertical head turns x 30 seconds; Airex cone taps alternating LE  Patient needs occasional verbal cueing to improve posture and cueing to correctly perform exercises slowly, holding at end of range to increase motor firing of desired muscle to encourage fatigue.                           PT Education - 01/05/16 1121    Education provided Yes   Education Details HEP   Person(s) Educated Patient   Methods Explanation   Comprehension Verbalized understanding             PT Long Term Goals - 11/30/15  0915    PT LONG TERM GOAL #1   Title Patient will be independent in home exercise program to improve strength/mobility for better functional independence with ADLs.   Time 12   Period Weeks   Status New   PT LONG TERM GOAL #2   Title Patient (> 22 years old) will complete five times sit to stand test in < 15 seconds indicating an increased LE strength and improved balance   Time 12   Period Weeks   Status New   PT LONG TERM GOAL #3   Title Patient will increase six minute walk test distance to >1000 for progression to community ambulator and improve gait ability   Time 12   Period Weeks   Status New   PT LONG TERM GOAL #4   Title Patient will reduce timed up and go to <11 seconds to reduce fall risk and demonstrate improved transfer/gait ability   Time 12   Period Weeks   Status New               Plan - 01/05/16 1122    Clinical Impression Statement Tactile cues and assistance needed to keep  lower leg and knee in neutral to avoid compensations with ankle motions   Pt will benefit from skilled therapeutic intervention in order to improve on the following deficits Abnormal gait;Decreased coordination;Difficulty walking;Decreased endurance;Impaired vision/preception;Decreased activity tolerance;Decreased balance;Decreased scar mobility;Impaired sensation;Decreased strength   Rehab Potential Fair   Clinical Impairments Affecting Rehab Potential numbness, weakness, loftstrand crutch, diabetes, circulation problems, left eye blurry  : patients clinical presentation is stable   PT Frequency 2x / week   PT Duration 12 weeks   PT Treatment/Interventions Therapeutic exercise;Therapeutic activities;Stair training;Gait training;Balance training;Neuromuscular re-education        Problem List Patient Active Problem List   Diagnosis Date Noted  . CVA (cerebral infarction) 11/07/2015  Alanson Puls, PT, DPT  [place] S 01/05/2016, 11:30 AM  [place] MAIN [facility] SERVICES [address] [place], [place], [address] Phone: [phone]   Fax:  [phone]  Name: Autumn Arnold MRN: [id number] Date of Birth: Feb 24, 1951

## 2016-01-09 ENCOUNTER — Ambulatory Visit: Payer: BLUE CROSS/BLUE SHIELD | Admitting: Occupational Therapy

## 2016-01-09 DIAGNOSIS — R460 Very low level of personal hygiene: Secondary | ICD-10-CM

## 2016-01-09 DIAGNOSIS — Z741 Need for assistance with personal care: Secondary | ICD-10-CM

## 2016-01-09 DIAGNOSIS — Z789 Other specified health status: Secondary | ICD-10-CM

## 2016-01-09 DIAGNOSIS — R531 Weakness: Secondary | ICD-10-CM

## 2016-01-09 DIAGNOSIS — R6889 Other general symptoms and signs: Secondary | ICD-10-CM

## 2016-01-09 DIAGNOSIS — IMO0002 Reserved for concepts with insufficient information to code with codable children: Secondary | ICD-10-CM

## 2016-01-09 DIAGNOSIS — I6980 Unspecified sequelae of other cerebrovascular disease: Secondary | ICD-10-CM | POA: Diagnosis not present

## 2016-01-10 ENCOUNTER — Encounter: Payer: Self-pay | Admitting: Occupational Therapy

## 2016-01-10 NOTE — Therapy (Signed)
[place] MAIN [facility] SERVICES [address] [place], [place], [address] Phone: [phone]   Fax:  [phone]  Occupational Therapy Treatment  Patient Details  Name: Autumn Arnold MRN: [id number] Date of Birth: [birth year] No Data Recorded  Encounter Date: 01/09/2016      OT End of Session - 01/10/16 1737    Visit Number 7   Number of Visits 24   Date for OT Re-Evaluation 03/13/16   OT Start Time 1125   OT Stop Time 1205   OT Time Calculation (min) 40 min   Activity Tolerance Patient tolerated treatment well   Behavior During Therapy [facility] for tasks assessed/performed      Past Medical History  Diagnosis Date  . Diabetes mellitus 2008  . Hyperlipidemia   . Hypertension   . Stroke [facility]) 02/2010    headache, left arm numbness  . Sleep apnea     CPAP at [facility], Dr. Humphrey Rolls    Past Surgical History  Procedure Laterality Date  . Anterior fusion cervical spine    . Elbow surgery      Tendonitis  . Carpal tunnel release      Bilateral  . Incontinence surgery    . Abdominal hysterectomy    . Tonsillectomy    . Cesarean section      There were no vitals filed for this visit.  Visit Diagnosis:  Weakness generalized  Lack of coordination due to stroke  Self-care deficit for dressing and grooming  Impaired instrumental activities of daily living (IADL)      Subjective Assessment - 01/10/16 1736    Subjective  Patient reports she is doing well, her ankle has been hurting and increases with standing.     Patient Stated Goals Patient wants to be able to have full use of her arm and leg to be able to take care of herself and be independent.     Currently in Pain? Yes   Pain Score 6    Pain Location Ankle   Pain Orientation Right   Pain Descriptors / Indicators Aching   Pain Type Acute pain   Pain Onset In the past 7 days   Multiple Pain Sites No                      OT Treatments/Exercises (OP) - 01/10/16 1740    Fine Motor Coordination   Other Fine Motor Exercises Patient seen for coordination tasks of picking up flat objects from tabletop with right hand.  Difficulty noted with manipulation of objects and tends to require pulling object to the edge of the table. Flipping cards from one side to another with difficulty using right hand.    Neurological Re-education Exercises   Other Exercises 1 Patient seen for RUE exercise with reaching tasks in sitting and standing with use of SAEBO tower and looped balls for all 4 levels.  Resistive pinch pins in sitting with placing onto elevated surface at shoulder height.                  OT Education - 01/10/16 1737    Education provided Yes   Education Details HEP, strength and coordination tasks   Person(s) Educated Patient   Methods Explanation;Demonstration;Verbal cues   Comprehension Returned demonstration;Verbal cues required;Verbalized understanding             OT Long Term Goals - 12/23/15 1349    OT LONG TERM GOAL #1   Title Patient  will be able to hold objects overhead for 30 secs for inspection and repeat up to 10 times for work related task.     Baseline unable at eval   Time 12   Period Weeks   Status New   OT LONG TERM GOAL #2   Title Patient will be able to lift 10# tote as simulated work task with modified independence and move from one surface to another.    Baseline unable at eval    Time 12   Period Weeks   Status New   OT LONG TERM GOAL #3   Title Patient will don pants with modified independence with good balance.   Baseline CGA to min assist   Time 12   Period Weeks   Status New   OT LONG TERM GOAL #4   Title Patient will improve grip strength on right by 10# to assist with opening jars and containers with modified independence.    Baseline 34# at eval   Time 12   Period Weeks   Status New   OT LONG TERM GOAL #5   Title Patient will improve coordination and strength to be able to don and fasten her bra with  modified independence.    Baseline min assist   Time 12   Period Weeks   Status New   Long Term Additional Goals   Additional Long Term Goals Yes   OT LONG TERM GOAL #6   Title Patient will increase strength by 1 mm grade to lift and pour a cup of coffee from full carafe.     Baseline unable, shaky   Time 12   Period Weeks   Status New               Plan - 01/10/16 1738    Clinical Impression Statement Patient limited with tasks in standing due to ankle pain.  Continuing to focus on RUE strength and coordination to improve independence in daily tasks.  Remains limited with RUE function for daily tasks.     Pt will benefit from skilled therapeutic intervention in order to improve on the following deficits (Retired) Abnormal gait;Impaired vision/preception;Decreased knowledge of use of DME;Decreased strength;Decreased balance;Difficulty walking;Decreased range of motion;Pain;Decreased coordination;Impaired UE functional use   Rehab Potential Good   OT Frequency 2x / week   OT Duration 12 weeks   OT Treatment/Interventions Self-care/ADL training;Therapeutic exercise;Functional Mobility Training;Patient/family education;Neuromuscular education;Manual Therapy;Balance training;Therapeutic exercises;DME and/or AE instruction   Consulted and Agree with Plan of Care Patient        Problem List Patient Active Problem List   Diagnosis Date Noted  . CVA (cerebral infarction) 11/07/2015   Amy T Tomasita Morrow, OTR/L, CLT  [place]ovett,Amy 01/10/2016, 5:45 PM  [place] MAIN [facility] SERVICES [address] [place], [place], [address] Phone: [phone]   Fax:  [phone]  Name: Autumn Arnold MRN: [id number] Date of Birth: May 07, 1951

## 2016-01-11 ENCOUNTER — Ambulatory Visit: Payer: BLUE CROSS/BLUE SHIELD | Admitting: Occupational Therapy

## 2016-01-11 ENCOUNTER — Ambulatory Visit: Payer: BLUE CROSS/BLUE SHIELD | Admitting: Physical Therapy

## 2016-01-11 ENCOUNTER — Encounter: Payer: Self-pay | Admitting: Physical Therapy

## 2016-01-11 DIAGNOSIS — IMO0002 Reserved for concepts with insufficient information to code with codable children: Secondary | ICD-10-CM

## 2016-01-11 DIAGNOSIS — R6889 Other general symptoms and signs: Secondary | ICD-10-CM

## 2016-01-11 DIAGNOSIS — Z741 Need for assistance with personal care: Secondary | ICD-10-CM

## 2016-01-11 DIAGNOSIS — R531 Weakness: Secondary | ICD-10-CM

## 2016-01-11 DIAGNOSIS — Z789 Other specified health status: Secondary | ICD-10-CM

## 2016-01-11 DIAGNOSIS — R262 Difficulty in walking, not elsewhere classified: Secondary | ICD-10-CM

## 2016-01-11 DIAGNOSIS — I6980 Unspecified sequelae of other cerebrovascular disease: Secondary | ICD-10-CM | POA: Diagnosis not present

## 2016-01-11 DIAGNOSIS — R460 Very low level of personal hygiene: Secondary | ICD-10-CM

## 2016-01-11 NOTE — Therapy (Signed)
[place] MAIN [facility] SERVICES [address] [place], [place], [address] Phone: [phone]   Fax:  [phone]  Physical Therapy Treatment  Patient Details  Name: Autumn Arnold MRN: [id number] Date of Birth: [birth year] Referring Provider: Netty Starring  Encounter Date: 01/11/2016      PT End of Session - 01/11/16 1011    Visit Number 13   Number of Visits 25   Date for PT Re-Evaluation 03/12/2016   Authorization Type g codes   Equipment Utilized During Treatment Gait belt   Activity Tolerance Patient limited by pain      Past Medical History  Diagnosis Date  . Diabetes mellitus 2008  . Hyperlipidemia   . Hypertension   . Stroke [facility]) 02/2010    headache, left arm numbness  . Sleep apnea     CPAP at [facility], Dr. Humphrey Rolls    Past Surgical History  Procedure Laterality Date  . Anterior fusion cervical spine    . Elbow surgery      Tendonitis  . Carpal tunnel release      Bilateral  . Incontinence surgery    . Abdominal hysterectomy    . Tonsillectomy    . Cesarean section      There were no vitals filed for this visit.  Visit Diagnosis:  Weakness generalized  Lack of coordination due to stroke  Difficulty walking      Subjective Assessment - 01/11/16 1010    Subjective Patient is having right ankle pain today and it has been hurting more.   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 5 minutes   How long can you walk comfortably? 10-15 minutes   Patient Stated Goals Patient wants to be able to walk better.    Pain Onset In the past 7 days   Pain Onset In the past 7 days    Patient is limited my left hip pain and right knee weakness and right ankle pain during mobility Therapeutic exercise : TM intervals 3 minutes x 3 with rest periods due to pain and  weakness at 1.0 miles / hour Leg press 90 lbs x 20 x 3 sets Step ups x 20 BLE Side stepping in parallel bars with RTB x 4 lengths Side stepping on 6 inch stool left and right x 20  Patient has weak R ankle and painful R ankle during mobility. Patient is ambulating with l 1 oftstrand crutch and has right ankle instability with pain and weakness.   .                              PT Education - 01/11/16 1011    Education provided Yes   Education Details HEP   Person(s) Educated Patient   Methods Explanation   Comprehension Verbalized understanding             PT Long Term Goals - 11/30/15 0915    PT LONG TERM GOAL #1   Title Patient will be independent in home exercise program to improve strength/mobility for better functional independence with ADLs.   Time 12   Period Weeks   Status New   PT LONG TERM GOAL #2   Title Patient (> 63 years old) will complete five times sit to  stand test in < 15 seconds indicating an increased LE strength and improved balance   Time 12   Period Weeks   Status New   PT LONG TERM GOAL #3   Title Patient will increase six minute walk test distance to >1000 for progression to community ambulator and improve gait ability   Time 12   Period Weeks   Status New   PT LONG TERM GOAL #4   Title Patient will reduce timed up and go to <11 seconds to reduce fall risk and demonstrate improved transfer/gait ability   Time 12   Period Weeks   Status New               Plan - 01/11/16 1012    Clinical Impression Statement Fatigue with sit to stand but demonstrating more control, Increase weight for standing exercises. Fatigue still evident with cross trainer and endurance. She is having right ankle pain.    Pt will benefit from skilled therapeutic intervention in order to improve on the following deficits Abnormal gait;Decreased coordination;Difficulty walking;Decreased endurance;Impaired vision/preception;Decreased activity  tolerance;Decreased balance;Decreased scar mobility;Impaired sensation;Decreased strength   Rehab Potential Fair   Clinical Impairments Affecting Rehab Potential numbness, weakness, loftstrand crutch, diabetes, circulation problems, left eye blurry  : patients clinical presentation is stable   PT Frequency 2x / week   PT Duration 12 weeks   PT Treatment/Interventions Therapeutic exercise;Therapeutic activities;Stair training;Gait training;Balance training;Neuromuscular re-education        Problem List Patient Active Problem List   Diagnosis Date Noted  . CVA (cerebral infarction) 11/07/2015   Alanson Puls, PT, DPT Arelia Sneddon S 01/11/2016, 10:15 AM  [place] MAIN [facility] SERVICES [address] [place], [place], [address] Phone: [phone]   Fax:  [phone]  Name: Autumn Arnold MRN: [id number] Date of Birth: 01/06/1951

## 2016-01-12 ENCOUNTER — Encounter: Payer: Self-pay | Admitting: Occupational Therapy

## 2016-01-12 NOTE — Therapy (Signed)
[place] MAIN [facility] SERVICES [address] [place], [place], [address] Phone: [phone]   Fax:  [phone]  Occupational Therapy Treatment  Patient Details  Name: Autumn Arnold MRN: [id number] Date of Birth: 02-14-1951 No Data Recorded  Encounter Date: 01/11/2016      OT End of Session - 01/12/16 1527    Visit Number 8   Number of Visits 24   Date for OT Re-Evaluation 03/13/16   OT Start Time 1045   OT Stop Time 1131   OT Time Calculation (min) 46 min   Activity Tolerance Patient tolerated treatment well   Behavior During Therapy [facility] for tasks assessed/performed      Past Medical History  Diagnosis Date  . Diabetes mellitus 2008  . Hyperlipidemia   . Hypertension   . Stroke [facility]) 02/2010    headache, left arm numbness  . Sleep apnea     CPAP at [facility], Dr. Humphrey Rolls    Past Surgical History  Procedure Laterality Date  . Anterior fusion cervical spine    . Elbow surgery      Tendonitis  . Carpal tunnel release      Bilateral  . Incontinence surgery    . Abdominal hysterectomy    . Tonsillectomy    . Cesarean section      There were no vitals filed for this visit.  Visit Diagnosis:  Weakness generalized  Lack of coordination due to stroke  Self-care deficit for dressing and grooming  Impaired instrumental activities of daily living (IADL)      Subjective Assessment - 01/12/16 1520    Subjective  Patient reports she is trying to get all her information for disability and has had trouble with the computer imputting data.  Unsure if it is saved and sent to website.   Patient Stated Goals Patient wants to be able to have full use of her arm and leg to be able to take care of herself and be independent.     Currently in Pain? No/denies   Pain Score 0-No pain   Multiple Pain Sites No                      OT Treatments/Exercises (OP) - 01/11/16 1521    Fine Motor Coordination   Other Fine Motor Exercises  Patient seen for manipulation of [place] discs with right hand with cues for technique and use of isolated finger movements to assist.  Patient performing translatory movements of the right hand with long dowel holding with a pen grip and moving fingers up and down the length of the dowel with verbal and tactile cues.  Manipulation of small toothpick sized sticks with oppositional movements with right hand alternating between thumb index and thumb middle finger.     Neurological Re-education Exercises   Other Exercises 1 Patient seen for resistive red medium putty for right grip strength and pinch exercises for lateral, 3 point and 2 point pinches.  UBE for 5 minutes forwards/backwards and alternating levels of resistance from 1.4 to 1.8 with therapist in constant attendance to ensure grip and adjust settings.  Grip strength with hand gripper on 3 setting, white spring for 5 reps, 2nd setting with white spring for 25 reps with right hand.                  OT Education - 01/12/16 1526    Education provided Yes   Education Details HEP   Person(s) Educated  Patient   Methods Explanation;Demonstration;Verbal cues   Comprehension Verbal cues required;Returned demonstration;Verbalized understanding             OT Long Term Goals - 12/23/15 1349    OT LONG TERM GOAL #1   Title Patient will be able to hold objects overhead for 30 secs for inspection and repeat up to 10 times for work related task.     Baseline unable at eval   Time 12   Period Weeks   Status New   OT LONG TERM GOAL #2   Title Patient will be able to lift 10# tote as simulated work task with modified independence and move from one surface to another.    Baseline unable at eval    Time 12   Period Weeks   Status New   OT LONG TERM GOAL #3   Title Patient will don pants with modified independence with good balance.   Baseline CGA to min assist   Time 12   Period Weeks   Status New   OT LONG TERM GOAL #4   Title  Patient will improve grip strength on right by 10# to assist with opening jars and containers with modified independence.    Baseline 34# at eval   Time 12   Period Weeks   Status New   OT LONG TERM GOAL #5   Title Patient will improve coordination and strength to be able to don and fasten her bra with modified independence.    Baseline min assist   Time 12   Period Weeks   Status New   Long Term Additional Goals   Additional Long Term Goals Yes   OT LONG TERM GOAL #6   Title Patient will increase strength by 1 mm grade to lift and pour a cup of coffee from full carafe.     Baseline unable, shaky   Time 12   Period Weeks   Status New               Plan - 01/12/16 1527    Clinical Impression Statement Patient demos difficulty with 3rd setting of hand gripper this date and can only complete 5 repetitions.  Patient showing progress in coordination skills of right hand with improved quality of movements, decreased dropping items and progressed to translatory movements of the hand.  Continue to work towards goals to improve independence in daily tasks at home and potential return to work if possible.    Pt will benefit from skilled therapeutic intervention in order to improve on the following deficits (Retired) Abnormal gait;Impaired vision/preception;Decreased knowledge of use of DME;Decreased strength;Decreased balance;Difficulty walking;Decreased range of motion;Pain;Decreased coordination;Impaired UE functional use   Rehab Potential Good   OT Frequency 2x / week   OT Duration 12 weeks   OT Treatment/Interventions Self-care/ADL training;Therapeutic exercise;Functional Mobility Training;Patient/family education;Neuromuscular education;Manual Therapy;Balance training;Therapeutic exercises;DME and/or AE instruction   Consulted and Agree with Plan of Care Patient        Problem List Patient Active Problem List   Diagnosis Date Noted  . CVA (cerebral infarction) 11/07/2015    Amy T Tomasita Morrow, OTR/L, CLT  Lovett,Amy 01/12/2016, 3:31 PM  [place] MAIN [facility] SERVICES [address] [place], [place], [address] Phone: [phone]   Fax:  [phone]  Name: CRISTABEL WATLINGTON MRN: [id number] Date of Birth: 10/26/1951

## 2016-01-17 ENCOUNTER — Ambulatory Visit: Payer: BLUE CROSS/BLUE SHIELD | Admitting: Physical Therapy

## 2016-01-17 ENCOUNTER — Ambulatory Visit: Payer: BLUE CROSS/BLUE SHIELD | Admitting: Occupational Therapy

## 2016-01-18 ENCOUNTER — Ambulatory Visit: Payer: BLUE CROSS/BLUE SHIELD | Admitting: Physical Therapy

## 2016-01-18 ENCOUNTER — Encounter: Payer: Self-pay | Admitting: Physical Therapy

## 2016-01-18 ENCOUNTER — Ambulatory Visit: Payer: BLUE CROSS/BLUE SHIELD | Admitting: Occupational Therapy

## 2016-01-18 DIAGNOSIS — IMO0002 Reserved for concepts with insufficient information to code with codable children: Secondary | ICD-10-CM

## 2016-01-18 DIAGNOSIS — R460 Very low level of personal hygiene: Secondary | ICD-10-CM

## 2016-01-18 DIAGNOSIS — Z741 Need for assistance with personal care: Secondary | ICD-10-CM

## 2016-01-18 DIAGNOSIS — R262 Difficulty in walking, not elsewhere classified: Secondary | ICD-10-CM

## 2016-01-18 DIAGNOSIS — Z789 Other specified health status: Secondary | ICD-10-CM

## 2016-01-18 DIAGNOSIS — R200 Anesthesia of skin: Secondary | ICD-10-CM

## 2016-01-18 DIAGNOSIS — R531 Weakness: Secondary | ICD-10-CM

## 2016-01-18 DIAGNOSIS — I6980 Unspecified sequelae of other cerebrovascular disease: Secondary | ICD-10-CM | POA: Diagnosis not present

## 2016-01-18 DIAGNOSIS — R202 Paresthesia of skin: Secondary | ICD-10-CM

## 2016-01-18 DIAGNOSIS — R6889 Other general symptoms and signs: Secondary | ICD-10-CM

## 2016-01-18 NOTE — Therapy (Signed)
[place] MAIN [facility] SERVICES [address] [place], [place], [address] Phone: [phone]   Fax:  [phone]  Physical Therapy Treatment  Patient Details  Name: Autumn Arnold MRN: [id number] Date of Birth: [birth year] Referring Provider: Netty Starring  Encounter Date: 01/18/2016      PT End of Session - 01/18/16 1411    Visit Number 14   Number of Visits 25   Date for PT Re-Evaluation 02/26/16   Authorization Type g codes   PT Start Time 0145   PT Stop Time 0230   PT Time Calculation (min) 45 min   Equipment Utilized During Treatment Gait belt   Activity Tolerance Patient limited by pain;Patient limited by fatigue   Behavior During Therapy [facility] for tasks assessed/performed      Past Medical History  Diagnosis Date  . Diabetes mellitus 2008  . Hyperlipidemia   . Hypertension   . Stroke [facility]) 02/2010    headache, left arm numbness  . Sleep apnea     CPAP at [facility], Dr. Humphrey Rolls    Past Surgical History  Procedure Laterality Date  . Anterior fusion cervical spine    . Elbow surgery      Tendonitis  . Carpal tunnel release      Bilateral  . Incontinence surgery    . Abdominal hysterectomy    . Tonsillectomy    . Cesarean section      There were no vitals filed for this visit.  Visit Diagnosis:  Weakness generalized  Lack of coordination due to stroke  Difficulty walking      Subjective Assessment - 01/18/16 1409    Subjective Patient is having a better day with no ankle pain , her knees are hurting 8/10.    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 5 minutes   How long can you walk comfortably? 10-15 minutes   Patient Stated Goals Patient wants to be able to walk better.    Pain Onset In the past 7 days   Pain Onset In the past 7 days      Gait  training with TM walking 1.0 m/ hour x  2 minutes x 5 repetitions with rest periods due to fatigue and knee pain. Leg press with 90 lbs x 20 x 3 set, heel raises on leg press with 90 lbs BLE Floor to stand transfer is not able to perform due to poor balance and lack of strength. sidelying  R hip abd x 5 x 2 hooklying marching x 5 x 2 Tall kneeling position from hand and knees to tall kneeling with min assist; pt unable to perform tall kneeling to 1/2 kneeling Patient has incoordination of RLE during exercises and fatigue after 5 repetitions against gravity. Patient has pain in LE knees and right ankle and weakness in LE's that cause decreased ability to perform therapy in standing position for longer than 10 minutes before needing a seated rest .                             PT Education - 01/18/16 1410    Education Details HEP   Methods Explanation   Comprehension Verbalized understanding             PT Long  Term Goals - 12/29/15 1034    PT LONG TERM GOAL #1   Title Patient will be independent in home exercise program to improve strength/mobility for better functional independence with ADLs.   Time 12   Period Weeks   Status Achieved   PT LONG TERM GOAL #2   Title Patient (> 61 years old) will complete five times sit to stand test in < 15 seconds indicating an increased LE strength and improved balance   Time 12   Period Weeks   Status Partially Met   PT LONG TERM GOAL #3   Title Patient will increase six minute walk test distance to >1000 for progression to community ambulator and improve gait ability   Time 12   Period Weeks   Status Partially Met   PT LONG TERM GOAL #4   Title Patient will reduce timed up and go to <11 seconds to reduce fall risk and demonstrate improved transfer/gait ability   Time 12   Period Weeks   Status Partially Met               Plan - 01/18/16 1412    Clinical Impression Statement Pain behaviors with standing  and walking activities limit her tolerance to therapy. She has decresed standing balance and is not able to transfer from floor to stand.    Pt will benefit from skilled therapeutic intervention in order to improve on the following deficits Abnormal gait;Decreased coordination;Difficulty walking;Decreased endurance;Impaired vision/preception;Decreased activity tolerance;Decreased balance;Decreased scar mobility;Impaired sensation;Decreased strength   Rehab Potential Fair   Clinical Impairments Affecting Rehab Potential numbness, weakness, loftstrand crutch, diabetes, circulation problems, left eye blurry  : patients clinical presentation is stable   PT Frequency 2x / week   PT Duration 12 weeks   PT Treatment/Interventions Therapeutic exercise;Therapeutic activities;Stair training;Gait training;Balance training;Neuromuscular re-education        Problem List Patient Active Problem List   Diagnosis Date Noted  . CVA (cerebral infarction) 11/07/2015  Alanson Puls, PT, DPT  [place], Minette Headland S 01/18/2016, 2:14 PM  [place] MAIN [facility] SERVICES [address] [place], [place], [address] Phone: [phone]   Fax:  [phone]  Name: Autumn Arnold MRN: [id number] Date of Birth: March 01, 1951

## 2016-01-18 NOTE — Therapy (Deleted)
[place] MAIN [facility] SERVICES [address] [place], [place], [address] Phone: [phone]   Fax:  [phone]  Patient Details  Name: Autumn Arnold MRN: [id number] Date of Birth: 01-15-1951 Referring Provider:  [name], MD  Encounter Date: 01/18/2016   Garlon Hatchet 01/18/2016, 1:25 PM  [place] MAIN [facility] SERVICES [address] [place], [place], [address] Phone: [phone]   Fax:  [phone]

## 2016-01-19 NOTE — Therapy (Signed)
[place] MAIN [facility] SERVICES [address] [place], [place], [address] Phone: [phone]   Fax:  [phone]  Occupational Therapy Treatment  Patient Details  Name: Autumn Arnold MRN: [id number] Date of Birth: 23-May-1951 No Data Recorded  Encounter Date: 01/18/2016      OT End of Session - 01/19/16 1558    Visit Number 9   Number of Visits 24   Date for OT Re-Evaluation 03/13/16   OT Start Time 1300   OT Stop Time 1345   OT Time Calculation (min) 45 min   Activity Tolerance Patient tolerated treatment well   Behavior During Therapy [facility] for tasks assessed/performed      Past Medical History  Diagnosis Date  . Diabetes mellitus 2008  . Hyperlipidemia   . Hypertension   . Stroke [facility]) 02/2010    headache, left arm numbness  . Sleep apnea     CPAP at [facility], Dr. Humphrey Rolls    Past Surgical History  Procedure Laterality Date  . Anterior fusion cervical spine    . Elbow surgery      Tendonitis  . Carpal tunnel release      Bilateral  . Incontinence surgery    . Abdominal hysterectomy    . Tonsillectomy    . Cesarean section      There were no vitals filed for this visit.  Visit Diagnosis:  Weakness generalized  Lack of coordination due to stroke  Self-care deficit for dressing and grooming  Impaired instrumental activities of daily living (IADL)  Numbness and tingling in right hand      Subjective Assessment - 01/19/16 1555    Subjective  Patient reports she would like to start to work on some of her work requirement tasks.  Feels she is getting  better but has a ways to go before being able to do her job again.  Needs to be independent without assistive device for functional mobility and carrying items, moving items, placing bins on shelves.    Patient Stated Goals Patient wants to be able to have full use of her arm and leg to be able to take care of herself and be independent.     Currently in Pain? No/denies   Pain  Score 0-No pain   Multiple Pain Sites No                      OT Treatments/Exercises (OP) - 01/19/16 2033    ADLs   Work Simulated work tasks included lifting 10# box from floor to table with cues for multiple reps.  Patient performing "inspection" of apparel, lifting shirts over head, inspecting, turning items and folding to place into bin, completed 10 reps in standing.  Patient able to demo lifting bin of 10# from floor to cart and can utilize cart at work to [employer].  She is unable to stand to use the hot press since she has decreased balance and often has to perform this on her tip toes to reach the press.  She also has decreased coordination of her right hand which places her at risk being around a hot object.   Fine Motor Coordination   Other Fine Motor Exercises Reassessed patient's  9 hole peg test on right 29 secs this date.  Patient performed a variety of manipulation tasks in the clinic this date with 1/2 inch sized objects, cues for turning objects with use of individual finger movements and isolated movements.  Neurological Re-education Exercises   Other Exercises 1 Reassessed patient's grip strength right hand 52#, left hand 56#, right lateral pinch 10#, left lateral pinch 14#.                  OT Education - 01/19/16 1557    Education provided Yes   Education Details status with goals, simulated work tasks, lifting of objects.   Person(s) Educated Patient   Methods Explanation;Demonstration;Verbal cues   Comprehension Verbal cues required;Returned demonstration;Verbalized understanding             OT Long Term Goals - 01/18/16 1325    OT LONG TERM GOAL #1   Title Patient will be able to hold objects overhead for 30 secs for inspection and repeat up to 10 times for work related task.     Time 12   Period Weeks   Status On-going   OT LONG TERM GOAL #2   Title Patient will be able to lift 10# tote as simulated work task with modified  independence and move from one surface to another.    Baseline able to move from floor to table but not yet onto shelf.   Time 12   Period Weeks   Status On-going   OT LONG TERM GOAL #3   Title Patient will don pants with modified independence with good balance.   Baseline able to perform if holding onto surface   Time 12   Period Weeks   Status Achieved   OT LONG TERM GOAL #4   Baseline improved but still has some difficulty at times with the right hand.   OT LONG TERM GOAL #5   Title Patient will improve coordination and strength to be able to don and fasten her bra with modified independence.    Baseline increased time required to complete task, need to be more time efficient to dress for return to work.   Time 12   Period Weeks   Status Partially Met   OT LONG TERM GOAL #6   Title Patient will increase strength by 1 mm grade to lift and pour a cup of coffee from full carafe.     Time 12   Period Weeks   Status Achieved               Plan - 01/19/16 1559    Clinical Impression Statement Patient has continued to progress towards her goals but continues to demo difficulty with tasks which would limit her ability to return to work to perform her duties as she did prior to her CVA.  She is currently still having to ambulate with one crutch with her balance limited.  This would affect her ability to carry any items which require 2 hands, no ladder use to remove or place bins and she will likely have increased difficulty with prolonged standing.  Given her diminished coordination and complaints of some numbness in the right hand, it would not be recommended at this time to utilize the hot press for safety reasons.  She is able to demonstrate standing and picking up bin of 10# and lift from floor to table with cues for back safety and technique.  She can also place bin onto a cart to use to transport bin from one area to another.  She is able to inspect apparel in standing for multiple  reps/trials and fold items to place into bin.  She is unable to lift bin to place onto overhead shelf at this time.  Pt will benefit from skilled therapeutic intervention in order to improve on the following deficits (Retired) Abnormal gait;Impaired vision/preception;Decreased knowledge of use of DME;Decreased strength;Decreased balance;Difficulty walking;Decreased range of motion;Pain;Decreased coordination;Impaired UE functional use   Rehab Potential Good   OT Frequency 2x / week   OT Duration 12 weeks   OT Treatment/Interventions Self-care/ADL training;Therapeutic exercise;Functional Mobility Training;Patient/family education;Neuromuscular education;Manual Therapy;Balance training;Therapeutic exercises;DME and/or AE instruction   Consulted and Agree with Plan of Care Patient        Problem List Patient Active Problem List   Diagnosis Date Noted  . CVA (cerebral infarction) 11/07/2015   Wylene Weissman T Tomasita Morrow, OTR/L, CLT Vianna Venezia 01/19/2016, 8:57 PM  [place] MAIN [facility] SERVICES [address] [place], [place], [address] Phone: [phone]   Fax:  [phone]  Name: EMERITA BERKEMEIER MRN: [id number] Date of Birth: 05-10-1951

## 2016-01-24 ENCOUNTER — Ambulatory Visit: Payer: BLUE CROSS/BLUE SHIELD | Admitting: Physical Therapy

## 2016-01-24 ENCOUNTER — Ambulatory Visit: Payer: BLUE CROSS/BLUE SHIELD | Admitting: Occupational Therapy

## 2016-01-26 ENCOUNTER — Encounter: Payer: Self-pay | Admitting: Physical Therapy

## 2016-01-26 ENCOUNTER — Ambulatory Visit: Payer: BLUE CROSS/BLUE SHIELD | Admitting: Physical Therapy

## 2016-01-26 DIAGNOSIS — IMO0002 Reserved for concepts with insufficient information to code with codable children: Secondary | ICD-10-CM

## 2016-01-26 DIAGNOSIS — R262 Difficulty in walking, not elsewhere classified: Secondary | ICD-10-CM

## 2016-01-26 DIAGNOSIS — R531 Weakness: Secondary | ICD-10-CM

## 2016-01-26 DIAGNOSIS — I6980 Unspecified sequelae of other cerebrovascular disease: Secondary | ICD-10-CM | POA: Diagnosis not present

## 2016-01-26 NOTE — Therapy (Signed)
[place] MAIN [facility] SERVICES [address] [place], [place], [address] Phone: [phone]   Fax:  [phone]  Physical Therapy Treatment  Patient Details  Name: MAVERICK PATMAN MRN: [id number] Date of Birth: August 10, 1951 Referring Provider: Netty Starring  Encounter Date: 01/26/2016      PT End of Session - 01/26/16 1134    Visit Number 15   Number of Visits 25   Date for PT Re-Evaluation 19-Mar-2016   Authorization Type g codes   PT Start Time [place] During Treatment Gait belt   Activity Tolerance Patient limited by pain;Patient limited by fatigue   Behavior During Therapy [facility] for tasks assessed/performed      Past Medical History  Diagnosis Date  . Diabetes mellitus 2008  . Hyperlipidemia   . Hypertension   . Stroke [facility]) 02/2010    headache, left arm numbness  . Sleep apnea     CPAP at [facility], Dr. Humphrey Rolls    Past Surgical History  Procedure Laterality Date  . Anterior fusion cervical spine    . Elbow surgery      Tendonitis  . Carpal tunnel release      Bilateral  . Incontinence surgery    . Abdominal hysterectomy    . Tonsillectomy    . Cesarean section      There were no vitals filed for this visit.  Visit Diagnosis:  Weakness generalized  Lack of coordination due to stroke  Difficulty walking      Subjective Assessment - 01/26/16 1133    Subjective Patient is having a better day with her right ankle brace and she continues to have a cough.    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 5 minutes   How long can you walk comfortably? 10-15 minutes   Patient Stated Goals Patient wants to be able to walk better.    Pain Onset In the past 7 days   Pain Onset In the past 7 days    standing hip abd with YTB x 20  side stepping left  and right in parallel bars 10 feet x 3 standing on blue foam with cone reaching x 20 across midline step ups from floor to 6 inch stool x 20 bilateral sit to stand x 10 marching in parallel bars x 20 stepping pattern with weight shifting fwd/bwd x 10.  TM walking . 8- 1.0 miles / hour x 3 minutes x 3 Gait training 500 feet x 3 with rest period and fatigue Patient needs occasional verbal cueing to improve posture and cueing to correctly perform exercises slowly, holding at end of range to increase motor firing of desired muscle to encourage fatigue.                              PT Education - 01/26/16 1133    Education provided Yes   Education Details training with adapative equipment   Person(s) Educated Patient   Methods Explanation   Comprehension Verbalized understanding             PT Long Term Goals - 12/29/15 1034    PT LONG TERM GOAL #1   Title Patient will be independent in home exercise program to improve strength/mobility for better  functional independence with ADLs.   Time 12   Period Weeks   Status Achieved   PT LONG TERM GOAL #2   Title Patient (> 63 years old) will complete five times sit to stand test in < 15 seconds indicating an increased LE strength and improved balance   Time 12   Period Weeks   Status Partially Met   PT LONG TERM GOAL #3   Title Patient will increase six minute walk test distance to >1000 for progression to community ambulator and improve gait ability   Time 12   Period Weeks   Status Partially Met   PT LONG TERM GOAL #4   Title Patient will reduce timed up and go to <11 seconds to reduce fall risk and demonstrate improved transfer/gait ability   Time 12   Period Weeks   Status Partially Met               Problem List Patient Active Problem List   Diagnosis Date Noted  . CVA (cerebral infarction) 11/07/2015   Alanson Puls, PT, DPT [place] S 01/26/2016, 12:34 PM  [place] MAIN [facility] SERVICES [address] [place], [place], [address] Phone: [phone]   Fax:  [phone]  Name: LORRINE KILLILEA MRN: [id number] Date of Birth: Feb 25, 1951

## 2016-01-31 ENCOUNTER — Ambulatory Visit: Payer: BLUE CROSS/BLUE SHIELD | Admitting: Physical Therapy

## 2016-01-31 ENCOUNTER — Ambulatory Visit: Payer: BLUE CROSS/BLUE SHIELD | Admitting: Occupational Therapy

## 2016-01-31 ENCOUNTER — Encounter: Payer: Self-pay | Admitting: Physical Therapy

## 2016-01-31 ENCOUNTER — Encounter: Payer: Self-pay | Admitting: Occupational Therapy

## 2016-01-31 DIAGNOSIS — R262 Difficulty in walking, not elsewhere classified: Secondary | ICD-10-CM

## 2016-01-31 DIAGNOSIS — R460 Very low level of personal hygiene: Secondary | ICD-10-CM

## 2016-01-31 DIAGNOSIS — Z789 Other specified health status: Secondary | ICD-10-CM

## 2016-01-31 DIAGNOSIS — R202 Paresthesia of skin: Secondary | ICD-10-CM

## 2016-01-31 DIAGNOSIS — Z741 Need for assistance with personal care: Secondary | ICD-10-CM

## 2016-01-31 DIAGNOSIS — I6980 Unspecified sequelae of other cerebrovascular disease: Secondary | ICD-10-CM | POA: Diagnosis not present

## 2016-01-31 DIAGNOSIS — IMO0002 Reserved for concepts with insufficient information to code with codable children: Secondary | ICD-10-CM

## 2016-01-31 DIAGNOSIS — R531 Weakness: Secondary | ICD-10-CM

## 2016-01-31 DIAGNOSIS — R6889 Other general symptoms and signs: Secondary | ICD-10-CM

## 2016-01-31 DIAGNOSIS — R200 Anesthesia of skin: Secondary | ICD-10-CM

## 2016-01-31 NOTE — Therapy (Signed)
[place] MAIN [facility] SERVICES [address] [place], [place], [address] Phone: [phone]   Fax:  [phone]  Physical Therapy Treatment  Patient Details  Name: HERMELA HARDT MRN: [id number] Date of Birth: 11-22-1950 Referring Provider: Netty Starring  Encounter Date: 01/31/2016      PT End of Session - 01/31/16 1120    Visit Number 16   Number of Visits 25   Date for PT Re-Evaluation Mar 01, 2016   Authorization Type g codes   PT Start Time December 22, 1113   PT Stop Time 1200   PT Time Calculation (min) 45 min   Equipment Utilized During Treatment Gait belt   Activity Tolerance Patient limited by pain;Patient limited by fatigue   Behavior During Therapy [facility] for tasks assessed/performed      Past Medical History  Diagnosis Date  . Diabetes mellitus 12-22-2006  . Hyperlipidemia   . Hypertension   . Stroke [facility]) 02/2010    headache, left arm numbness  . Sleep apnea     CPAP at [facility], Dr. Humphrey Rolls    Past Surgical History  Procedure Laterality Date  . Anterior fusion cervical spine    . Elbow surgery      Tendonitis  . Carpal tunnel release      Bilateral  . Incontinence surgery    . Abdominal hysterectomy    . Tonsillectomy    . Cesarean section      There were no vitals filed for this visit.  Visit Diagnosis:  Weakness generalized  Lack of coordination due to stroke  Difficulty walking  Numbness and tingling in right hand      Subjective Assessment - 01/31/16 1119    Subjective Patient is having a better day with her right ankle brace and she continues to have a cough.    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 5 minutes   How long can you walk comfortably? 10-15 minutes   Patient Stated Goals Patient wants to be able to walk better.    Pain Onset In the past 7  days   Pain Onset In the past 7 days     Therapeutic exercise: Supine march 2x10 Supine hip abd/add 2x10 Bridge 2x10 Supine hip ER yellow band 2x10 Sitting LAQ 2x10 Sitting clamshell yellow band 2x10   standing hip abd with YTB x 20  side stepping left and right with loft strand crutch 10 feet x 3  step ups from floor to 6 inch stool x 20 bilateral sit to stand x 10 marching  x 20  Gait training with loft strand crutch ascending and descending steps and level surfaces 1000 feet with rest periods and TM walking 4 minutes x 2 sessions. Patient needs occasional verbal cueing to improve posture and cueing to correctly perform exercises slowly and with correct head position.                            PT Education - 01/31/16 1120    Education provided Yes   Education Details training with loft strand crutch   Person(s) Educated Patient   Methods Explanation   Comprehension Verbalized understanding             PT Long Term Goals - 12/29/15 12/22/1032  PT LONG TERM GOAL #1   Title Patient will be independent in home exercise program to improve strength/mobility for better functional independence with ADLs.   Time 12   Period Weeks   Status Achieved   PT LONG TERM GOAL #2   Title Patient (> 63 years old) will complete five times sit to stand test in < 15 seconds indicating an increased LE strength and improved balance   Time 12   Period Weeks   Status Partially Met   PT LONG TERM GOAL #3   Title Patient will increase six minute walk test distance to >1000 for progression to community ambulator and improve gait ability   Time 12   Period Weeks   Status Partially Met   PT LONG TERM GOAL #4   Title Patient will reduce timed up and go to <11 seconds to reduce fall risk and demonstrate improved transfer/gait ability   Time 12   Period Weeks   Status Partially Met               Plan - 01/31/16 1121    Clinical Impression Statement Patient  reports RLE ankle hurts on lateral side today 0/10 now.   Pt will benefit from skilled therapeutic intervention in order to improve on the following deficits Abnormal gait;Decreased coordination;Difficulty walking;Decreased endurance;Impaired vision/preception;Decreased activity tolerance;Decreased balance;Decreased scar mobility;Impaired sensation;Decreased strength   Rehab Potential Fair   Clinical Impairments Affecting Rehab Potential numbness, weakness, loftstrand crutch, diabetes, circulation problems, left eye blurry  : patients clinical presentation is stable   PT Frequency 2x / week   PT Duration 12 weeks   PT Treatment/Interventions Therapeutic exercise;Therapeutic activities;Stair training;Gait training;Balance training;Neuromuscular re-education        Problem List Patient Active Problem List   Diagnosis Date Noted  . CVA (cerebral infarction) 11/07/2015   Alanson Puls, PT, DPT [place] S 01/31/2016, 11:23 AM  [place] MAIN [facility] SERVICES [address] [place], [place], [address] Phone: [phone]   Fax:  [phone]  Name: LAVERA VANDERMEER MRN: [id number] Date of Birth: [birth year]

## 2016-02-01 NOTE — Therapy (Signed)
[place] MAIN [facility] SERVICES [address] [place], [place], [address] Phone: [phone]   Fax:  [phone]  Occupational Therapy Treatment  Patient Details  Name: Autumn Arnold MRN: [id number] Date of Birth: Dec 18, 1950 No Data Recorded  Encounter Date: 01/31/2016      OT End of Session - 01/31/16 2008    Visit Number 10   Number of Visits 24   Date for OT Re-Evaluation 03/13/16   OT Start Time 1030   OT Stop Time 1115   OT Time Calculation (min) 45 min   Activity Tolerance Patient tolerated treatment well   Behavior During Therapy [facility] for tasks assessed/performed      Past Medical History  Diagnosis Date  . Diabetes mellitus 2008  . Hyperlipidemia   . Hypertension   . Stroke [facility]) 02/2010    headache, left arm numbness  . Sleep apnea     [facility]AP at [facility], Dr. Humphrey Rolls    Past Surgical History  Procedure Laterality Date  . Anterior fusion cervical spine    . Elbow surgery      Tendonitis  . Carpal tunnel release      Bilateral  . Incontinence surgery    . Abdominal hysterectomy    . Tonsillectomy    . Cesarean section      There were no vitals filed for this visit.  Visit Diagnosis:  Weakness generalized  Lack of coordination due to stroke  Self-care deficit for dressing and grooming  Impaired instrumental activities of daily living (IADL)      Subjective Assessment - 01/31/16 2003    Subjective  Patient reports she starts to get pain with prolonged walking just below the ankle bone on the right, from the hip down on the left.  No pain currently. Unable to put on jewelry and has not driven yet.   Patient Stated Goals Patient wants to be able to have full use of her arm and leg to be able to take care of herself and be independent.     Currently in Pain? No/denies   Pain Score 0-No pain                      OT Treatments/Exercises (OP) - 01/31/16 2004    ADLs   Work Simulated work tasks this date  with lifting 14# box from floor to table, to rolling cart.  Patient performing inspections of tshirts, holding up overhead, turning, folding and placing into box, repeated for 25 reps in 15 minutes during session, translates into 100 pcs per hour if pace is sustained.  Additional work tasks to [employer] on cart to other areas, lift and moving box to various areas.    Fine Motor Coordination   Other Fine Motor Exercises Patient seen for fine motor manipulation skills with 1/2 inch objects, translatory movements and using the hand for storage.                 OT Education - 02/01/16 2008    Education provided Yes   Education Details work tasks, pacing, lifting strategies, Customer service manager) Educated Patient   Methods Explanation;Demonstration;Verbal cues   Comprehension Verbal cues required;Returned demonstration;Verbalized understanding             OT Long Term Goals - 01/18/16 1325    OT LONG TERM GOAL #1   Title Patient will be able to hold objects overhead for 30 secs for inspection and repeat  up to 10 times for work related task.     Time 12   Period Weeks   Status On-going   OT LONG TERM GOAL #2   Title Patient will be able to lift 10# tote as simulated work task with modified independence and move from one surface to another.    Baseline able to move from floor to table but not yet onto shelf.   Time 12   Period Weeks   Status On-going   OT LONG TERM GOAL #3   Title Patient will don pants with modified independence with good balance.   Baseline able to perform if holding onto surface   Time 12   Period Weeks   Status Achieved   OT LONG TERM GOAL #4   Baseline improved but still has some difficulty at times with the right hand.   OT LONG TERM GOAL #5   Title Patient will improve coordination and strength to be able to don and fasten her bra with modified independence.    Baseline increased time required to complete task, need to be more time  efficient to dress for return to work.   Time 12   Period Weeks   Status Partially Met   OT LONG TERM GOAL #6   Title Patient will increase strength by 1 mm grade to lift and pour a cup of coffee from full carafe.     Time 12   Period Weeks   Status Achieved               Plan - 02/01/16 2009    Clinical Impression Statement Patient engaging in work tasks this date, is hopeful for returning to work soon if possible.  She was able to complete 25 pcs of inspection per 15 minutes and completed in standing.  Discussed prolonged standing and the need to take breaks at times, she is unsure if she will be allowed to do this at work.  Continue to work towards tasks to improve independence at home and work related tasks.    Pt will benefit from skilled therapeutic intervention in order to improve on the following deficits (Retired) Abnormal gait;Impaired vision/preception;Decreased knowledge of use of DME;Decreased strength;Decreased balance;Difficulty walking;Decreased range of motion;Pain;Decreased coordination;Impaired UE functional use   Rehab Potential Good   OT Frequency 2x / week   OT Duration 12 weeks   OT Treatment/Interventions Self-care/ADL training;Therapeutic exercise;Functional Mobility Training;Patient/family education;Neuromuscular education;Manual Therapy;Balance training;Therapeutic exercises;DME and/or AE instruction   Consulted and Agree with Plan of Care Patient        Problem List Patient Active Problem List   Diagnosis Date Noted  . CVA (cerebral infarction) 11/07/2015   Amy T Tomasita Morrow, OTR/L, CLT  Lovett,Amy 02/01/2016, 8:12 PM  [place] MAIN [facility] SERVICES [address] [place], [place], [address] Phone: [phone]   Fax:  [phone]  Name: Autumn Arnold MRN: [id number] Date of Birth: [birth year]

## 2016-02-02 ENCOUNTER — Ambulatory Visit: Payer: BLUE CROSS/BLUE SHIELD | Admitting: Occupational Therapy

## 2016-02-02 ENCOUNTER — Encounter: Payer: Self-pay | Admitting: Physical Therapy

## 2016-02-02 ENCOUNTER — Ambulatory Visit: Payer: BLUE CROSS/BLUE SHIELD | Admitting: Physical Therapy

## 2016-02-02 DIAGNOSIS — IMO0002 Reserved for concepts with insufficient information to code with codable children: Secondary | ICD-10-CM

## 2016-02-02 DIAGNOSIS — R460 Very low level of personal hygiene: Secondary | ICD-10-CM

## 2016-02-02 DIAGNOSIS — R531 Weakness: Secondary | ICD-10-CM

## 2016-02-02 DIAGNOSIS — I6980 Unspecified sequelae of other cerebrovascular disease: Secondary | ICD-10-CM | POA: Diagnosis not present

## 2016-02-02 DIAGNOSIS — R6889 Other general symptoms and signs: Secondary | ICD-10-CM

## 2016-02-02 DIAGNOSIS — Z741 Need for assistance with personal care: Secondary | ICD-10-CM

## 2016-02-02 DIAGNOSIS — Z789 Other specified health status: Secondary | ICD-10-CM

## 2016-02-02 DIAGNOSIS — R262 Difficulty in walking, not elsewhere classified: Secondary | ICD-10-CM

## 2016-02-02 NOTE — Therapy (Signed)
[place] MAIN [facility] SERVICES [address] [place], [place], [address] Phone: [phone]   Fax:  [phone]  Physical Therapy Treatment  Patient Details  Name: Autumn Arnold MRN: [id number] Date of Birth: 05-Mar-1951 Referring Provider: Netty Starring  Encounter Date: 02/02/2016      PT End of Session - 02/02/16 1146    Visit Number 17   Number of Visits 25   Date for PT Re-Evaluation 28-Feb-2016   Authorization Type g codes   PT Start Time December 20, 1113   PT Stop Time 1200   PT Time Calculation (min) 45 min   Equipment Utilized During Treatment Gait belt   Activity Tolerance Patient limited by pain;Patient limited by fatigue   Behavior During Therapy [facility] for tasks assessed/performed      Past Medical History  Diagnosis Date  . Diabetes mellitus 2006/12/20  . Hyperlipidemia   . Hypertension   . Stroke [facility]) 02/2010    headache, left arm numbness  . Sleep apnea     CPAP at [facility], Dr. Humphrey Rolls    Past Surgical History  Procedure Laterality Date  . Anterior fusion cervical spine    . Elbow surgery      Tendonitis  . Carpal tunnel release      Bilateral  . Incontinence surgery    . Abdominal hysterectomy    . Tonsillectomy    . Cesarean section      There were no vitals filed for this visit.  Visit Diagnosis:  Weakness generalized  Difficulty walking  Lack of coordination due to stroke      Subjective Assessment - 02/02/16 1141    Subjective Patient is having a better day with her right ankle brace.   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 5 minutes   How long can you walk comfortably? 10-15 minutes   Patient Stated Goals Patient wants to be able to walk better.    Pain Onset In the past 7 days   Pain Onset In the past 7 days      standing hip abd with YTB x  20  side stepping left and right in parallel bars 10 feet x 3 standing on blue foam with cone reaching x 20 across midline step ups from floor to 6 inch stool x 20 bilateral sit to stand x 10 marching in parallel bars x 20  NEUROMUSCULAR RE-ED Gait in hallway with horizontal and vertical head turns 80' x 2; Standing slow marching without UE support 2 x 10; Toe taps on step x 10 bilateral, alternating; Modified tandem stance eyes open/closed x 30 seconds each alternating LE; Modified tandem stance eyes open with horizontal and vertical head turns alternating LE Min cueing needed to appropriately perform balance tasks with leg, hand, and head position.. Patient continues to demonstrate some in coordination of movement with select exercises.  Patient responds well to verbal and tactile cues to correct form and technique.  CGA to SBA for safety with activities.                             PT Education - 02/02/16 1143    Education provided Yes   Education Details safety with brace   Person(s) Educated Patient   Methods Explanation  Comprehension Verbalized understanding             PT Long Term Goals - 12/29/15 1034    PT LONG TERM GOAL #1   Title Patient will be independent in home exercise program to improve strength/mobility for better functional independence with ADLs.   Time 12   Period Weeks   Status Achieved   PT LONG TERM GOAL #2   Title Patient (> 41 years old) will complete five times sit to stand test in < 15 seconds indicating an increased LE strength and improved balance   Time 12   Period Weeks   Status Partially Met   PT LONG TERM GOAL #3   Title Patient will increase six minute walk test distance to >1000 for progression to community ambulator and improve gait ability   Time 12   Period Weeks   Status Partially Met   PT LONG TERM GOAL #4   Title Patient will reduce timed up and go to <11 seconds to reduce fall risk and demonstrate  improved transfer/gait ability   Time 12   Period Weeks   Status Partially Met               Plan - 02/02/16 1146    Clinical Impression Statement  Pt reports discomfort during but overall less joint stiffness and pain following.    Pt will benefit from skilled therapeutic intervention in order to improve on the following deficits Abnormal gait;Decreased coordination;Difficulty walking;Decreased endurance;Impaired vision/preception;Decreased activity tolerance;Decreased balance;Decreased scar mobility;Impaired sensation;Decreased strength   Rehab Potential Fair   Clinical Impairments Affecting Rehab Potential numbness, weakness, loftstrand crutch, diabetes, circulation problems, left eye blurry  : patients clinical presentation is stable   PT Frequency 2x / week   PT Duration 12 weeks   PT Treatment/Interventions Therapeutic exercise;Therapeutic activities;Stair training;Gait training;Balance training;Neuromuscular re-education        Problem List Patient Active Problem List   Diagnosis Date Noted  . CVA (cerebral infarction) 11/07/2015   Alanson Puls, PT, DPT  Arelia Sneddon S 02/02/2016, 11:55 AM  [place] MAIN [facility] SERVICES [address] [place], [place], [address] Phone: [phone]   Fax:  [phone]  Name: Autumn Arnold MRN: [id number] Date of Birth: 07-11-1951

## 2016-02-03 ENCOUNTER — Encounter: Payer: Self-pay | Admitting: Occupational Therapy

## 2016-02-03 NOTE — Therapy (Signed)
[place] MAIN [facility] SERVICES [address] [place], [place], [address] Phone: [phone]   Fax:  [phone]  Occupational Therapy Treatment  Patient Details  Name: Autumn Arnold MRN: [id number] Date of Birth: 03-18-1951 No Data Recorded  Encounter Date: 02/02/2016      OT End of Session - 02/03/16 1402    Visit Number 11   Number of Visits 24   Date for OT Re-Evaluation 03/13/16   OT Start Time 1036   OT Stop Time 1116   OT Time Calculation (min) 40 min   Activity Tolerance Patient tolerated treatment well   Behavior During Therapy [facility] for tasks assessed/performed      Past Medical History  Diagnosis Date  . Diabetes mellitus 2008  . Hyperlipidemia   . Hypertension   . Stroke [facility]) 02/2010    headache, left arm numbness  . Sleep apnea     CPAP at [facility], Dr. Humphrey Rolls    Past Surgical History  Procedure Laterality Date  . Anterior fusion cervical spine    . Elbow surgery      Tendonitis  . Carpal tunnel release      Bilateral  . Incontinence surgery    . Abdominal hysterectomy    . Tonsillectomy    . Cesarean section      There were no vitals filed for this visit.  Visit Diagnosis:  Weakness generalized  Lack of coordination due to stroke  Self-care deficit for dressing and grooming  Impaired instrumental activities of daily living (IADL)      Subjective Assessment - 02/03/16 0949    Subjective  Patient reports she did well the other day after therapy, slightly fatigued and ankle hurt below the bone.  She reports her workplace would like for her to return soon if possible.   Pertinent History Pt. presents with RUE weakness and incoordination following a CVA.    Patient Stated Goals Patient wants to be able to have full use of her arm and leg to be able to take care of herself and be independent.     Pain Score 2    Pain Location Ankle   Pain Orientation Right   Pain Descriptors / Indicators Aching   Pain Type  Acute pain   Pain Onset In the past 7 days   Multiple Pain Sites No                      OT Treatments/Exercises (OP) - 02/03/16 0951    ADLs   Work Simulated work tasks this date with lifting 15# box from floor to table, to rolling cart. Patient performing inspections of apparel, holding up overhead, turning, folding and placing into box, repeated for 60 reps in 30 minutes during session, translates into 120 pcs per hour if pace is sustained. Additional work tasks to [employer] on cart to other areas, lift and moving box to various areas, preparing work space, Paediatric nurse   Other Fine Motor Exercises --   Neurological Re-education Exercises   Other Exercises 1 Patient seen for strengthening tasks with red theraband for bilateral UE strengthening for shoulder flexion, ABD/ADD, diagonal patterns, elbow flexion/ext. 2 sets of 15 reps each exercise with cues.                OT Education - 02/03/16 1402    Education provided Yes   Education Details work tasks, Engineer, materials, Tree surgeon) Educated Patient  Methods Explanation;Demonstration;Verbal cues   Comprehension Verbal cues required;Returned demonstration;Verbalized understanding             OT Long Term Goals - 01/18/16 1325    OT LONG TERM GOAL #1   Title Patient will be able to hold objects overhead for 30 secs for inspection and repeat up to 10 times for work related task.     Time 12   Period Weeks   Status On-going   OT LONG TERM GOAL #2   Title Patient will be able to lift 10# tote as simulated work task with modified independence and move from one surface to another.    Baseline able to move from floor to table but not yet onto shelf.   Time 12   Period Weeks   Status On-going   OT LONG TERM GOAL #3   Title Patient will don pants with modified independence with good balance.   Baseline able to perform if holding onto surface   Time 12   Period Weeks   Status  Achieved   OT LONG TERM GOAL #4   Baseline improved but still has some difficulty at times with the right hand.   OT LONG TERM GOAL #5   Title Patient will improve coordination and strength to be able to don and fasten her bra with modified independence.    Baseline increased time required to complete task, need to be more time efficient to dress for return to work.   Time 12   Period Weeks   Status Partially Met   OT LONG TERM GOAL #6   Title Patient will increase strength by 1 mm grade to lift and pour a cup of coffee from full carafe.     Time 12   Period Weeks   Status Achieved               Plan - 02/03/16 1403    Clinical Impression Statement Patient continues to progress and was faster this date with simulated work tasks than last session.  Was able to stand and complete tasks for 30 minutes straight without taking any breaks.  Will continue to work towards improving strength, coordination and increased ability to perform work tasks.     Pt will benefit from skilled therapeutic intervention in order to improve on the following deficits (Retired) Abnormal gait;Impaired vision/preception;Decreased knowledge of use of DME;Decreased strength;Decreased balance;Difficulty walking;Decreased range of motion;Pain;Decreased coordination;Impaired UE functional use   Rehab Potential Good   OT Frequency 2x / week   OT Duration 12 weeks   OT Treatment/Interventions Self-care/ADL training;Therapeutic exercise;Functional Mobility Training;Patient/family education;Neuromuscular education;Manual Therapy;Balance training;Therapeutic exercises;DME and/or AE instruction   Consulted and Agree with Plan of Care Patient        Problem List Patient Active Problem List   Diagnosis Date Noted  . CVA (cerebral infarction) 11/07/2015   Amy T Tomasita Morrow, OTR/L, CLT  Lovett,Amy 02/03/2016, 2:07 PM  [place] MAIN [facility] SERVICES [address] [place],  [place], [address] Phone: [phone]   Fax:  [phone]  Name: KINZLEY SAVELL MRN: [id number] Date of Birth: December 29, 1950

## 2016-02-07 ENCOUNTER — Ambulatory Visit: Payer: BLUE CROSS/BLUE SHIELD | Admitting: Occupational Therapy

## 2016-02-07 ENCOUNTER — Ambulatory Visit: Payer: Self-pay | Admitting: Physical Therapy

## 2016-02-07 ENCOUNTER — Ambulatory Visit: Payer: BLUE CROSS/BLUE SHIELD | Attending: Family Medicine | Admitting: Physical Therapy

## 2016-02-07 ENCOUNTER — Encounter: Payer: Self-pay | Admitting: Physical Therapy

## 2016-02-07 DIAGNOSIS — M6281 Muscle weakness (generalized): Secondary | ICD-10-CM | POA: Diagnosis present

## 2016-02-07 DIAGNOSIS — R262 Difficulty in walking, not elsewhere classified: Secondary | ICD-10-CM | POA: Diagnosis present

## 2016-02-07 DIAGNOSIS — R531 Weakness: Secondary | ICD-10-CM | POA: Diagnosis present

## 2016-02-07 DIAGNOSIS — R279 Unspecified lack of coordination: Secondary | ICD-10-CM | POA: Insufficient documentation

## 2016-02-07 DIAGNOSIS — R278 Other lack of coordination: Secondary | ICD-10-CM | POA: Insufficient documentation

## 2016-02-07 DIAGNOSIS — I6980 Unspecified sequelae of other cerebrovascular disease: Secondary | ICD-10-CM | POA: Insufficient documentation

## 2016-02-07 NOTE — Therapy (Signed)
[place] MAIN [facility] SERVICES [address] [place], [place], [address] Phone: [phone]   Fax:  [phone]  Physical Therapy Treatment  Patient Details  Name: Autumn Arnold MRN: [id number] Date of Birth: [birth year] Referring Provider: Netty Starring  Encounter Date: 02/07/2016      PT End of Session - 02/07/16 0906    Visit Number 18   Number of Visits 25   Date for PT Re-Evaluation 2016/03/21   Authorization Type g codes   PT Start Time 0930   PT Stop Time 1015   PT Time Calculation (min) 45 min   Equipment Utilized During Treatment Gait belt   Activity Tolerance Patient limited by pain;Patient limited by fatigue   Behavior During Therapy [facility] for tasks assessed/performed      Past Medical History  Diagnosis Date  . Diabetes mellitus 2008  . Hyperlipidemia   . Hypertension   . Stroke [facility]) 02/2010    headache, left arm numbness  . Sleep apnea     CPAP at [facility], Dr. Humphrey Rolls    Past Surgical History  Procedure Laterality Date  . Anterior fusion cervical spine    . Elbow surgery      Tendonitis  . Carpal tunnel release      Bilateral  . Incontinence surgery    . Abdominal hysterectomy    . Tonsillectomy    . Cesarean section      There were no vitals filed for this visit.  Visit Diagnosis:  Weakness generalized  Difficulty walking      Subjective Assessment - 02/07/16 0940    Subjective Patient is having a bad day because she felll and hurt her back.    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 5 minutes   How long can you walk comfortably? 10-15 minutes   Patient Stated Goals Patient wants to be able to walk better.    Pain Onset In the past 7 days   Pain Onset In the past 7 days     Therapeutic exercise; Treatment was modified today due to  low back pain after a fall: Supine  hip abd/ER with RTB x 20 x 3 sets hooklying marching x 20 x 3 sets SAQ BLE x 20 x 3 Hip abd/add x 20 x 2 supine  Patient has some increased pain in low back following therapy.                               PT Education - 02/07/16 0906    Education provided Yes   Education Details HEP progression   Person(s) Educated Patient   Methods Explanation   Comprehension Verbalized understanding             PT Long Term Goals - 12/29/15 1034    PT LONG TERM GOAL #1   Title Patient will be independent in home exercise program to improve strength/mobility for better functional independence with ADLs.   Time 12   Period Weeks   Status Achieved   PT LONG TERM GOAL #2   Title Patient (> 39 years old) will complete five times sit to stand test in < 15 seconds indicating an increased LE strength and improved balance   Time 12   Period  Weeks   Status Partially Met   PT LONG TERM GOAL #3   Title Patient will increase six minute walk test distance to >1000 for progression to community ambulator and improve gait ability   Time 12   Period Weeks   Status Partially Met   PT LONG TERM GOAL #4   Title Patient will reduce timed up and go to <11 seconds to reduce fall risk and demonstrate improved transfer/gait ability   Time 12   Period Weeks   Status Partially Met               Plan - 02/07/16 0941    Clinical Impression Statement Patient is having pain in her back from a fall saturday. She was able to perform LE exercises in supine with some increased pain.    Pt will benefit from skilled therapeutic intervention in order to improve on the following deficits Abnormal gait;Decreased coordination;Difficulty walking;Decreased endurance;Impaired vision/preception;Decreased activity tolerance;Decreased balance;Decreased scar mobility;Impaired sensation;Decreased strength   Rehab Potential Fair   Clinical Impairments Affecting  Rehab Potential numbness, weakness, loftstrand crutch, diabetes, circulation problems, left eye blurry  : patients clinical presentation is stable   PT Frequency 2x / week   PT Duration 12 weeks   PT Treatment/Interventions Therapeutic exercise;Therapeutic activities;Stair training;Gait training;Balance training;Neuromuscular re-education        Problem List Patient Active Problem List   Diagnosis Date Noted  . CVA (cerebral infarction) 11/07/2015  Alanson Puls, PT, DPT  [place] 02/07/2016, 9:47 AM  [place] MAIN [facility] SERVICES [address] [place], [place], [address] Phone: [phone]   Fax:  [phone]  Name: Autumn Arnold MRN: [id number] Date of Birth: 10-25-1951

## 2016-02-09 ENCOUNTER — Encounter: Payer: Self-pay | Admitting: Physical Therapy

## 2016-02-09 ENCOUNTER — Ambulatory Visit: Payer: BLUE CROSS/BLUE SHIELD | Admitting: Physical Therapy

## 2016-02-09 ENCOUNTER — Ambulatory Visit: Payer: BLUE CROSS/BLUE SHIELD | Admitting: Occupational Therapy

## 2016-02-09 ENCOUNTER — Ambulatory Visit: Payer: Self-pay | Admitting: Physical Therapy

## 2016-02-09 DIAGNOSIS — IMO0002 Reserved for concepts with insufficient information to code with codable children: Secondary | ICD-10-CM

## 2016-02-09 DIAGNOSIS — M6281 Muscle weakness (generalized): Secondary | ICD-10-CM

## 2016-02-09 DIAGNOSIS — R262 Difficulty in walking, not elsewhere classified: Secondary | ICD-10-CM

## 2016-02-09 DIAGNOSIS — R531 Weakness: Secondary | ICD-10-CM | POA: Diagnosis not present

## 2016-02-09 DIAGNOSIS — R279 Unspecified lack of coordination: Secondary | ICD-10-CM

## 2016-02-09 NOTE — Therapy (Addendum)
[place] MAIN [facility] SERVICES [address] [place], [place], [address] Phone: [phone]   Fax:  [phone]  Physical Therapy Treatment  Patient Details  Name: Autumn Arnold MRN: [id number] Date of Birth: [birth year] Referring Provider: Netty Starring  Encounter Date: 02/09/2016      PT End of Session - 02/09/16 0934    Visit Number 19   Number of Visits 25   Date for PT Re-Evaluation 2016/03/04   Authorization Type g codes   PT Start Time 0923   PT Stop Time 1001   PT Time Calculation (min) 38 min   Equipment Utilized During Treatment Gait belt   Activity Tolerance Patient limited by pain;Patient limited by fatigue   Behavior During Therapy [facility] for tasks assessed/performed      Past Medical History  Diagnosis Date  . Diabetes mellitus 01-10-07  . Hyperlipidemia   . Hypertension   . Stroke [facility]) 02/2010    headache, left arm numbness  . Sleep apnea     CPAP at [facility], Dr. Humphrey Rolls    Past Surgical History  Procedure Laterality Date  . Anterior fusion cervical spine    . Elbow surgery      Tendonitis  . Carpal tunnel release      Bilateral  . Incontinence surgery    . Abdominal hysterectomy    . Tonsillectomy    . Cesarean section      There were no vitals filed for this visit.  Visit Diagnosis:  Difficulty walking  Lack of coordination due to stroke  Muscle weakness (generalized)      Subjective Assessment - 02/09/16 0933    Subjective Patient is doing better today wiht minimal back pain.   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 5 minutes   How long can you walk comfortably? 10-15 minutes   Patient Stated Goals Patient wants to be able to walk better.    Currently in Pain? Yes   Pain Score 10-Worst pain ever   Pain Location --  back pain is 2/10  legs are 10/10   Pain Orientation Right;Left   Pain Onset In the past 7 days   Pain Onset In the past 7 days       Therapeutic exercise: nu-step for warm up   standing hip abd with YTB x 20  side stepping left and right in parallel bars , blue foam 10 feet x 3 standing on blue foam with cone reaching, sorting balls across midline step ups from floor to 6 inch stool x 20 bilateral sit to stand x 10 marching in parallel bars x 20 CGA needed for balance with pt demonstrating fair stability during balance activities; needs UE support   G codes: Current 40-60 % CK 8978 Goal 20-40  % CJ 8979 Patient continues to ambulate with loftstrand crutch and had AFO and pain that limits her gait and mobility                     PT Education - 02/09/16 0934    Education provided Yes   Education Details HEP   Person(s) Educated Patient   Methods Explanation   Comprehension Verbalized understanding             PT Long Term Goals - 12/29/15 1034  PT LONG TERM GOAL #1   Title Patient will be independent in home exercise program to improve strength/mobility for better functional independence with ADLs.   Time 12   Period Weeks   Status Achieved   PT LONG TERM GOAL #2   Title Patient (> 58 years old) will complete five times sit to stand test in < 15 seconds indicating an increased LE strength and improved balance   Time 12   Period Weeks   Status Partially Met   PT LONG TERM GOAL #3   Title Patient will increase six minute walk test distance to >1000 for progression to community ambulator and improve gait ability   Time 12   Period Weeks   Status Partially Met   PT LONG TERM GOAL #4   Title Patient will reduce timed up and go to <11 seconds to reduce fall risk and demonstrate improved transfer/gait ability   Time 12   Period Weeks   Status Partially Met               Plan - 02/09/16 0935    Clinical Impression Statement Patient performs strengthening  and balance activities but is limited today by 10/10 leg pain and 2/10 back pain.    Pt will benefit from skilled therapeutic intervention in order to improve on the following deficits Abnormal gait;Decreased coordination;Difficulty walking;Decreased endurance;Impaired vision/preception;Decreased activity tolerance;Decreased balance;Decreased scar mobility;Impaired sensation;Decreased strength   Rehab Potential Fair   Clinical Impairments Affecting Rehab Potential numbness, weakness, loftstrand crutch, diabetes, circulation problems, left eye blurry  : patients clinical presentation is stable   PT Frequency 2x / week   PT Duration 12 weeks   PT Treatment/Interventions Therapeutic exercise;Therapeutic activities;Stair training;Gait training;Balance training;Neuromuscular re-education        Problem List Patient Active Problem List   Diagnosis Date Noted  . CVA (cerebral infarction) 11/07/2015   Alanson Puls, PT, DPT Arel[place] Sned[place] S 02/09/2016, 9:36 AM  [place] MAIN [facility] SERVICES [address] [place], [place], [address] Phone: [phone]   Fax:  [phone]  Name: NEKA BISE MRN: [id number] Date of Birth: 06-18-1951

## 2016-02-10 ENCOUNTER — Encounter: Payer: Self-pay | Admitting: Occupational Therapy

## 2016-02-10 NOTE — Therapy (Signed)
[place] MAIN [facility] SERVICES [address] [place], [place], [address] Phone: [phone]   Fax:  [phone]  Occupational Therapy Treatment  Patient Details  Name: DESIRIE MINTEER MRN: [id number] Date of Birth: 14-Apr-1951 No Data Recorded  Encounter Date: 02/09/2016      OT End of Session - 02/10/16 2112    Visit Number 12   Number of Visits 24   Date for OT Re-Evaluation 03/13/16   OT Start Time 1015   OT Stop Time 1100   OT Time Calculation (min) 45 min   Activity Tolerance Patient tolerated treatment well   Behavior During Therapy [facility] for tasks assessed/performed      Past Medical History  Diagnosis Date  . Diabetes mellitus 2008  . Hyperlipidemia   . Hypertension   . Stroke [facility]) 02/2010    headache, left arm numbness  . Sleep apnea     CPAP at [facility], Dr. Humphrey Rolls    Past Surgical History  Procedure Laterality Date  . Anterior fusion cervical spine    . Elbow surgery      Tendonitis  . Carpal tunnel release      Bilateral  . Incontinence surgery    . Abdominal hysterectomy    . Tonsillectomy    . Cesarean section      There were no vitals filed for this visit.      Subjective Assessment - 02/10/16 2110    Subjective  Patient reports her back is feeling better and she can move around more today however, she does not want to try work tasks involving lifting and repetitive motions for fear it may cause her back to "lock up" again.     Pertinent History Pt. presents with RUE weakness and incoordination following a CVA.    Patient Stated Goals Patient wants to be able to have full use of her arm and leg to be able to take care of herself and be independent.     Currently in Pain? Yes   Pain Score 8    Pain Location Leg   Pain Orientation Right;Left   Pain Descriptors / Indicators Aching   Pain Type Acute pain   Pain Onset In the past 7 days   Multiple Pain Sites No                      OT  Treatments/Exercises (OP) - 02/10/16 2141    Fine Motor Coordination   Other Fine Motor Exercises Patient seen for focus on fine motor coordination with manipulation of small objects, 1/2 inch in size, placing and removing into vertical slots with cues, manipulation of toothpicks picking up from table, placing into container with small holes, alternating opposition of  digits index and thumb as well as thumb and MF, cues for alternating pattern.    Neurological Re-education Exercises   Other Exercises 1 Patient seen for RUE strengthening tasks of multidrectional reach combined with resistive lateral key pinch, all levels of pinch pins with cues, increased difficulty with most resistive black pins.                OT Education - 02/10/16 2112    Education provided Yes   Education Details exercises to help with lack of coordination, work related tasks    [employer]) Educated Patient   Methods Explanation;Demonstration;Verbal cues   Comprehension Verbal cues required;Returned demonstration;Verbalized understanding             OT Long  Term Goals - 01/18/16 1325    OT LONG TERM GOAL #1   Title Patient will be able to hold objects overhead for 30 secs for inspection and repeat up to 10 times for work related task.     Time 12   Period Weeks   Status On-going   OT LONG TERM GOAL #2   Title Patient will be able to lift 10# tote as simulated work task with modified independence and move from one surface to another.    Baseline able to move from floor to table but not yet onto shelf.   Time 12   Period Weeks   Status On-going   OT LONG TERM GOAL #3   Title Patient will don pants with modified independence with good balance.   Baseline able to perform if holding onto surface   Time 12   Period Weeks   Status Achieved   OT LONG TERM GOAL #4   Baseline improved but still has some difficulty at times with the right hand.   OT LONG TERM GOAL #5   Title Patient will improve  coordination and strength to be able to don and fasten her bra with modified independence.    Baseline increased time required to complete task, need to be more time efficient to dress for return to work.   Time 12   Period Weeks   Status Partially Met   OT LONG TERM GOAL #6   Title Patient will increase strength by 1 mm grade to lift and pour a cup of coffee from full carafe.     Time 12   Period Weeks   Status Achieved               Plan - 02/10/16 2113    Clinical Impression Statement Patient has been limited this week with participation in therapy due to back and leg pain.  She was unable to stay for OT session last scheduled visit.  She was able to attend this dates session however, she could not participate in lifting, twisting or squatting tasks. Focus this date on reaching and fine motor coordination tasks.  She continues to benefit from skilled OT to maximize her safety and independence in necessary daily tasks for work and home.    Rehab Potential Good   OT Frequency 2x / week   OT Duration 12 weeks   OT Treatment/Interventions Self-care/ADL training;Therapeutic exercise;Functional Mobility Training;Patient/family education;Neuromuscular education;Manual Therapy;Balance training;Therapeutic exercises;DME and/or AE instruction   Consulted and Agree with Plan of Care Patient      Patient will benefit from skilled therapeutic intervention in order to improve the following deficits and impairments:  Abnormal gait, Impaired vision/preception, Decreased knowledge of use of DME, Decreased strength, Decreased balance, Difficulty walking, Decreased range of motion, Pain, Decreased coordination, Impaired UE functional use  Visit Diagnosis: Muscle weakness (generalized)  Lack of coordination    Problem List Patient Active Problem List   Diagnosis Date Noted  . CVA (cerebral infarction) 11/07/2015   Amy T Tomasita Morrow, OTR/L, CLT  Lovett,Amy 02/10/2016, 9:49 PM  [place] MAIN [facility] SERVICES [address] [place], [place], [address] Phone: [phone]   Fax:  [phone]  Name: EZMERALDA STEFANICK MRN: [id number] Date of Birth: [birth year]

## 2016-02-14 ENCOUNTER — Ambulatory Visit: Payer: Self-pay | Admitting: Physical Therapy

## 2016-02-14 ENCOUNTER — Encounter: Payer: Self-pay | Admitting: Occupational Therapy

## 2016-02-15 ENCOUNTER — Ambulatory Visit: Payer: BLUE CROSS/BLUE SHIELD | Admitting: Physical Therapy

## 2016-02-15 ENCOUNTER — Ambulatory Visit: Payer: BLUE CROSS/BLUE SHIELD | Admitting: Occupational Therapy

## 2016-02-15 ENCOUNTER — Encounter: Payer: Self-pay | Admitting: Physical Therapy

## 2016-02-15 ENCOUNTER — Encounter: Payer: Self-pay | Admitting: Occupational Therapy

## 2016-02-15 DIAGNOSIS — M6281 Muscle weakness (generalized): Secondary | ICD-10-CM

## 2016-02-15 DIAGNOSIS — R531 Weakness: Secondary | ICD-10-CM | POA: Diagnosis not present

## 2016-02-15 DIAGNOSIS — R278 Other lack of coordination: Secondary | ICD-10-CM

## 2016-02-15 DIAGNOSIS — R279 Unspecified lack of coordination: Secondary | ICD-10-CM

## 2016-02-15 NOTE — Therapy (Signed)
[place] MAIN [facility] SERVICES [address] [place], [place], [address] Phone: [phone]   Fax:  [phone]  Physical Therapy Treatment/Discharge Summary  Patient Details  Name: Autumn Arnold MRN: [id number] Date of Birth: [birth year] Referring Provider: Netty Starring  Encounter Date: 02/15/2016      PT End of Session - 02/15/16 1234    Visit Number 20   Number of Visits 25   Date for PT Re-Evaluation 21-Mar-2016   Authorization Type g codes   PT Start Time 0370   PT Stop Time 1100   PT Time Calculation (min) 32 min   Equipment Utilized During Treatment Gait belt   Activity Tolerance Patient limited by pain;Patient tolerated treatment well   Behavior During Therapy [facility] for tasks assessed/performed      Past Medical History  Diagnosis Date  . Diabetes mellitus 2008  . Hyperlipidemia   . Hypertension   . Stroke [facility]) 02/2010    headache, left arm numbness  . Sleep apnea     CPAP at [facility], Dr. Humphrey Rolls    Past Surgical History  Procedure Laterality Date  . Anterior fusion cervical spine    . Elbow surgery      Tendonitis  . Carpal tunnel release      Bilateral  . Incontinence surgery    . Abdominal hysterectomy    . Tonsillectomy    . Cesarean section      There were no vitals filed for this visit.      Subjective Assessment - 02/15/16 1032    Subjective Patient was late to appointment; she reports increased right ankle pain today; she did not wear her AFO today due to increased ankle pain; she presents to therapy with single UE forearm crutch; "I thought about cancelling today, but I came anyway"   Patient is accompained by: Family member   Pertinent History Patient lives with husband, she was driving prior to her CVA, patient was working 10 hours/day,    Limitations Walking   How long can you stand comfortably? 5 minutes   How long can you walk comfortably? 10-15 minutes   Patient Stated Goals Patient wants to be able to walk  better.    Currently in Pain? Yes   Pain Score 8    Pain Location Ankle   Pain Orientation Right   Pain Descriptors / Indicators Aching;Sore   Pain Type Chronic pain   Pain Onset In the past 7 days            [facility] PT Assessment - 02/15/16 0001    6 minute walk test results    Aerobic Endurance Distance Walked 900   Endurance additional comments with LUE forearm crutch; <1000 feet is limited community ambulator;    Standardized Balance Assessment   Five times sit to stand comments  10.5 sec without HHA (< 15 sec indicates low fall risk, improved from last reassessment on 12/29/15 which was 15.8 sec)   10 Meter Walk 1.05 m/s with left forearm crutch; community ambulator; improved from last reassessment on 12/29/15 which was 0.9 m/s   Timed Up and Go Test   Normal TUG (seconds) 8.9   TUG Comments with LUE forearm crutch; low fall risk; improved from last reassessment on 12/29/15 which was 13.5 sec        TREATMENT: Warm up on Nustep BUE/BLE level 2 x3 min (unbilled);  PT instructed patient in outcome measures including, 10 meter walk, timed up and go, 5 times  sit<>Stand, and 6 min walk; Please see above; Educated patient in progress towards goals. She reports that she is walking better and feels that she could continue exercising at home. Re-emphasized importance of HEP compliance especially with strengthening to improve stance control with longer gait tasks;                       PT Education - 02/25/16 1234    Education provided Yes   Education Details Progress towards goals; findings, recommendation and HEP re-inforced;    Person(s) Educated Patient   Methods Explanation;Verbal cues   Comprehension Verbalized understanding;Returned demonstration;Verbal cues required             PT Long Term Goals - 02-25-2016 1033    PT LONG TERM GOAL #1   Title Patient will be independent in home exercise program to improve strength/mobility for better functional  independence with ADLs.   Time 12   Period Weeks   Status Achieved   PT LONG TERM GOAL #2   Title Patient (> 51 years old) will complete five times sit to stand test in < 15 seconds indicating an increased LE strength and improved balance   Time 12   Period Weeks   Status Achieved   PT LONG TERM GOAL #3   Title Patient will increase six minute walk test distance to >1000 for progression to community ambulator and improve gait ability   Time 12   Period Weeks   Status Partially Met   PT LONG TERM GOAL #4   Title Patient will reduce timed up and go to <11 seconds to reduce fall risk and demonstrate improved transfer/gait ability   Time 12   Period Weeks   Status Achieved               Plan - 2016-02-25 1235    Clinical Impression Statement Patient has met most goals. She is walking mod I with left forearm crutch. She does occasionally have foot drag with prolonged gait tasks but is able to maintain her balance. She reports wanting to go back to work and feels that she could do the basic tasks necessary to perform her work tasks. She did test as a lower fall risk and she reports being able to walk better and home and in the community. Patient is appropriate for DC from therapy as she has met her goals and doesn't feel that there is anything else to work on.    Rehab Potential Fair   Clinical Impairments Affecting Rehab Potential numbness, weakness, loftstrand crutch, diabetes, circulation problems, left eye blurry  : patients clinical presentation is stable   PT Frequency 2x / week   PT Duration 12 weeks   PT Treatment/Interventions Therapeutic exercise;Therapeutic activities;Stair training;Gait training;Balance training;Neuromuscular re-education   PT Next Visit Plan DC from therapy   PT Home Exercise Plan Re-infroced;    Consulted and Agree with Plan of Care Patient;Family member/caregiver   Family Member Consulted husband      Patient will benefit from skilled therapeutic  intervention in order to improve the following deficits and impairments:  Abnormal gait, Decreased coordination, Difficulty walking, Decreased endurance, Impaired vision/preception, Decreased activity tolerance, Decreased balance, Decreased scar mobility, Impaired sensation, Decreased strength  Visit Diagnosis: Muscle weakness (generalized)  Unspecified lack of coordination       G-Codes - February 25, 2016 1237    Functional Assessment Tool Used 5 x sit to stand, 10 MW, 6 MW   Functional Limitation Mobility: Walking  and moving around   Mobility: Walking and Moving Around Goal Status [phone]) At least 40 percent but less than 60 percent impaired, limited or restricted   Mobility: Walking and Moving Around Discharge Status [id number]) At least 20 percent but less than 40 percent impaired, limited or restricted      Problem List Patient Active Problem List   Diagnosis Date Noted  . CVA (cerebral infarction) 11/07/2015    Trotter,Margaret PT, DPT 02/15/2016, 12:38 PM  [place] MAIN [facility] SERVICES [address] [place], [place], [address] Phone: [phone]   Fax:  [phone]  Name: LOVEDA COLAIZZI MRN: [id number] Date of Birth: 10-06-1951

## 2016-02-16 ENCOUNTER — Ambulatory Visit: Payer: Self-pay | Admitting: Physical Therapy

## 2016-02-17 ENCOUNTER — Ambulatory Visit: Payer: BLUE CROSS/BLUE SHIELD | Admitting: Occupational Therapy

## 2016-02-17 NOTE — Therapy (Signed)
[place] MAIN [facility] SERVICES [address] [place], [place], [address] Phone: [phone]   Fax:  [phone]  Occupational Therapy Treatment/Discharge Summary 12/20/2015-02/15/2016  Patient Details  Name: Autumn Arnold MRN: [id number] Date of Birth: 01-11-1951 No Data Recorded  Encounter Date: 02/15/2016      OT End of Session - 02/16/16 1049    Visit Number 13   Number of Visits 24   Date for OT Re-Evaluation 03/13/16   Activity Tolerance Patient tolerated treatment well   Behavior During Therapy [facility] for tasks assessed/performed      Past Medical History  Diagnosis Date  . Diabetes mellitus 2008  . Hyperlipidemia   . Hypertension   . Stroke [facility]) 02/2010    headache, left arm numbness  . Sleep apnea     CPAP at [facility], Dr. Humphrey Rolls    Past Surgical History  Procedure Laterality Date  . Anterior fusion cervical spine    . Elbow surgery      Tendonitis  . Carpal tunnel release      Bilateral  . Incontinence surgery    . Abdominal hysterectomy    . Tonsillectomy    . Cesarean section      There were no vitals filed for this visit.      Subjective Assessment - 02/16/16 1053    Subjective  Patient reports she will have to continue to use the one crutch for balance going forwards, she is being discharged from PT this date.  Feels like she is ready to finish up with OT as well, she feels she can continue to do exercises at home and with time will get better.  She is planning to return to work soon, will focus on increased standing tasks around the house to build up her endurance and strength to return to work.  She is hopeful her job will make accomodations as needed for her to be successful, such as allowing her to work in an area she is able given her need to use a crutch.      Pertinent History Pt. presents with RUE weakness and incoordination following a CVA.    Patient Stated Goals Patient wants to be able to have full use of her  arm and leg to be able to take care of herself and be independent.                        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  She may require increased breaks to perform this task with return to work.  She feels she can perform the heat press primarily using her left hand but accommodations will need to be made to lower the heat press so that she does not have to be on her tip toes to complete (if not this will be unsafe with potential loss of balance).  Suggested she consider returning to work for a partial day in the beginning and work towards increasing her hours up to 8 hours a day gradually.  Recommend she use a cart to transport bins and have assistance if needing to place or remove bins overhead initially.   Fine Motor Coordination   Other Fine Motor Exercises Patient seen for  focus on fine motor coordination with manipulation of small objects, 1/2 inch in size, placing and removing into vertical slots with cues, manipulation of toothpicks picking up from table, placing into container with small holes, alternating opposition of  digits index and thumb as well as thumb and MF, cues for alternating pattern. Patient instructed on exercises at home to perform to focus on fine motor coordination and prehension patterns such as manipulation of nuts/bolts/dried beans/paperclips, etc, she understands moving objects from fingertips to palm and using the hand for storage.     Neurological Re-education Exercises   Other Exercises 1 Reassessment of skills this date as follows:  Grip strength right 45#, left 55# (patient is left hand dominant), right lateral pinch 10#, left 12#.  3 point pinch right 8#, left 9#.  Strength is 4+/5 overall right UE, LUE 5/5.  9 hole peg test on  right 29 secs, left 28 secs.                  OT Education - 02/17/16 1048    Education provided Yes             OT Long Term Goals - 02/17/16 1052    OT LONG TERM GOAL #1   Title Patient will be able to hold objects overhead for 30 secs for inspection and repeat up to 10 times for work related task.     Time 12   Period Weeks   Status Achieved   OT LONG TERM GOAL #2   Title Patient will be able to lift 10# tote as simulated work task with modified independence and move from one surface to another.    Baseline able to move from floor to table but not yet onto shelf.   Time 12   Period Weeks   Status Achieved   OT LONG TERM GOAL #3   Title Patient will don pants with modified independence with good balance.   Baseline able to perform if holding onto surface   Time 12   Period Weeks   Status Achieved   OT LONG TERM GOAL #4   Title Patient will improve grip strength on right by 10# to assist with opening jars and containers with modified independence.    Baseline improved but still has some difficulty at times with the right hand.   Time 12   Period Weeks   Status Achieved   OT LONG TERM GOAL #5   Title Patient will improve coordination and strength to be able to don and fasten her bra with modified independence.    Baseline increased time required to complete task, need to be more time efficient to dress for return to work.   Time 12   Period Weeks   Status Achieved   OT LONG TERM GOAL #6   Title Patient will increase strength by 1 mm grade to lift and pour a cup of coffee from full carafe.     Time 12   Period Weeks   Status Achieved               Plan - 02/16/16 1049    Clinical Impression Statement Patient was reassessed this date and has met her initial goals and feels ready for discharge from OT services. She continues to demo decreased endurance and would benefit from spending the next couple weeks working towards being on her feet most of the  day and building her endurance to be active for long periods of time during the  day prior to returning to work, preferably around May 1.  She is independent with her home exercise program.  See above recommendations for return to work modifications/recommendations.  Will discharge patient at this time with goals met.     Rehab Potential Good   OT Frequency 2x / week   OT Duration 12 weeks   OT Treatment/Interventions Self-care/ADL training;Therapeutic exercise;Functional Mobility Training;Patient/family education;Neuromuscular education;Manual Therapy;Balance training;Therapeutic exercises;DME and/or AE instruction   Consulted and Agree with Plan of Care Patient      Patient will benefit from skilled therapeutic intervention in order to improve the following deficits and impairments:  Abnormal gait, Impaired vision/preception, Decreased knowledge of use of DME, Decreased strength, Decreased balance, Difficulty walking, Decreased range of motion, Pain, Decreased coordination, Impaired UE functional use  Visit Diagnosis: Muscle weakness (generalized)  Other lack of coordination    Problem List Patient Active Problem List   Diagnosis Date Noted  . CVA (cerebral infarction) 11/07/2015   Sheniah Supak T Tomasita Morrow, OTR/L, CLT  Victory Dresden 02/17/2016, 10:54 AM  [place] MAIN [facility] SERVICES [address] [place], [place], [address] Phone: [phone]   Fax:  [phone]  Name: Autumn Arnold MRN: [id number] Date of Birth: January 31, 1951

## 2016-02-20 ENCOUNTER — Ambulatory Visit: Payer: Self-pay | Admitting: Physical Therapy

## 2016-02-20 ENCOUNTER — Ambulatory Visit: Payer: BLUE CROSS/BLUE SHIELD | Admitting: Occupational Therapy

## 2016-02-22 ENCOUNTER — Ambulatory Visit: Payer: Self-pay | Admitting: Physical Therapy

## 2016-02-22 ENCOUNTER — Ambulatory Visit: Payer: BLUE CROSS/BLUE SHIELD | Admitting: Occupational Therapy

## 2016-02-22 ENCOUNTER — Ambulatory Visit: Payer: BLUE CROSS/BLUE SHIELD | Admitting: Physical Therapy

## 2016-02-27 ENCOUNTER — Ambulatory Visit: Payer: BLUE CROSS/BLUE SHIELD | Admitting: Occupational Therapy

## 2016-02-27 ENCOUNTER — Ambulatory Visit: Payer: Self-pay | Admitting: Physical Therapy

## 2016-02-29 ENCOUNTER — Encounter: Payer: Self-pay | Admitting: Occupational Therapy

## 2016-02-29 ENCOUNTER — Ambulatory Visit: Payer: Self-pay | Admitting: Physical Therapy

## 2016-03-01 ENCOUNTER — Ambulatory Visit: Payer: BLUE CROSS/BLUE SHIELD | Admitting: Occupational Therapy

## 2016-03-01 ENCOUNTER — Ambulatory Visit: Payer: BLUE CROSS/BLUE SHIELD | Admitting: Physical Therapy

## 2016-03-06 ENCOUNTER — Ambulatory Visit: Payer: BLUE CROSS/BLUE SHIELD | Admitting: Occupational Therapy

## 2016-03-06 ENCOUNTER — Ambulatory Visit: Payer: BLUE CROSS/BLUE SHIELD | Admitting: Physical Therapy

## 2016-03-07 ENCOUNTER — Encounter: Payer: Self-pay | Admitting: *Deleted

## 2016-03-07 NOTE — Pre-Procedure Instructions (Signed)
CLEARED BY DR Netty Starring 03/06/16

## 2016-03-08 ENCOUNTER — Ambulatory Visit: Payer: BLUE CROSS/BLUE SHIELD | Admitting: Physical Therapy

## 2016-03-08 ENCOUNTER — Ambulatory Visit: Payer: BLUE CROSS/BLUE SHIELD | Admitting: Occupational Therapy

## 2016-03-09 NOTE — H&P (Signed)
See scanned note.

## 2016-03-12 ENCOUNTER — Encounter
Admission: RE | Disposition: A | Discharge status: Home / Self Care | Payer: Self-pay | Source: Ambulatory Visit | Attending: Ophthalmology

## 2016-03-12 ENCOUNTER — Encounter: Payer: Self-pay | Admitting: *Deleted

## 2016-03-12 ENCOUNTER — Ambulatory Visit
Admission: RE | Admit: 2016-03-12 | Discharge: 2016-03-12 | Disposition: A | Discharge status: Home / Self Care | Payer: BLUE CROSS/BLUE SHIELD | Source: Ambulatory Visit | Attending: Ophthalmology | Admitting: Ophthalmology

## 2016-03-12 ENCOUNTER — Ambulatory Visit: Payer: BLUE CROSS/BLUE SHIELD | Admitting: Physical Therapy

## 2016-03-12 ENCOUNTER — Ambulatory Visit: Payer: BLUE CROSS/BLUE SHIELD | Admitting: Occupational Therapy

## 2016-03-12 ENCOUNTER — Ambulatory Visit: Payer: BLUE CROSS/BLUE SHIELD | Admitting: Anesthesiology

## 2016-03-12 DIAGNOSIS — G4730 Sleep apnea, unspecified: Secondary | ICD-10-CM | POA: Diagnosis not present

## 2016-03-12 DIAGNOSIS — R0602 Shortness of breath: Secondary | ICD-10-CM | POA: Diagnosis not present

## 2016-03-12 DIAGNOSIS — K219 Gastro-esophageal reflux disease without esophagitis: Secondary | ICD-10-CM | POA: Insufficient documentation

## 2016-03-12 DIAGNOSIS — J45909 Unspecified asthma, uncomplicated: Secondary | ICD-10-CM | POA: Diagnosis not present

## 2016-03-12 DIAGNOSIS — R05 Cough: Secondary | ICD-10-CM | POA: Diagnosis not present

## 2016-03-12 DIAGNOSIS — E119 Type 2 diabetes mellitus without complications: Secondary | ICD-10-CM | POA: Insufficient documentation

## 2016-03-12 DIAGNOSIS — F17200 Nicotine dependence, unspecified, uncomplicated: Secondary | ICD-10-CM | POA: Diagnosis not present

## 2016-03-12 DIAGNOSIS — Z90710 Acquired absence of both cervix and uterus: Secondary | ICD-10-CM | POA: Insufficient documentation

## 2016-03-12 DIAGNOSIS — I10 Essential (primary) hypertension: Secondary | ICD-10-CM | POA: Diagnosis not present

## 2016-03-12 DIAGNOSIS — H25042 Posterior subcapsular polar age-related cataract, left eye: Secondary | ICD-10-CM | POA: Diagnosis not present

## 2016-03-12 DIAGNOSIS — Z8673 Personal history of transient ischemic attack (TIA), and cerebral infarction without residual deficits: Secondary | ICD-10-CM | POA: Insufficient documentation

## 2016-03-12 DIAGNOSIS — M1990 Unspecified osteoarthritis, unspecified site: Secondary | ICD-10-CM | POA: Insufficient documentation

## 2016-03-12 DIAGNOSIS — Z85828 Personal history of other malignant neoplasm of skin: Secondary | ICD-10-CM | POA: Diagnosis not present

## 2016-03-12 DIAGNOSIS — H2512 Age-related nuclear cataract, left eye: Secondary | ICD-10-CM | POA: Diagnosis not present

## 2016-03-12 DIAGNOSIS — H268 Other specified cataract: Secondary | ICD-10-CM | POA: Diagnosis present

## 2016-03-12 DIAGNOSIS — F329 Major depressive disorder, single episode, unspecified: Secondary | ICD-10-CM | POA: Diagnosis not present

## 2016-03-12 DIAGNOSIS — M7989 Other specified soft tissue disorders: Secondary | ICD-10-CM | POA: Diagnosis not present

## 2016-03-12 DIAGNOSIS — R062 Wheezing: Secondary | ICD-10-CM | POA: Diagnosis not present

## 2016-03-12 DIAGNOSIS — E7800 Pure hypercholesterolemia, unspecified: Secondary | ICD-10-CM | POA: Diagnosis not present

## 2016-03-12 HISTORY — DX: Cough, unspecified: R05.9

## 2016-03-12 HISTORY — DX: Gastro-esophageal reflux disease without esophagitis: K21.9

## 2016-03-12 HISTORY — DX: Unspecified asthma, uncomplicated: J45.909

## 2016-03-12 HISTORY — DX: Wheezing: R06.2

## 2016-03-12 HISTORY — DX: Cough: R05

## 2016-03-12 HISTORY — DX: Edema, unspecified: R60.9

## 2016-03-12 HISTORY — DX: Reserved for inherently not codable concepts without codable children: IMO0001

## 2016-03-12 HISTORY — PX: CATARACT EXTRACTION W/PHACO: SHX586

## 2016-03-12 LAB — GLUCOSE, CAPILLARY: GLUCOSE-CAPILLARY: 116 mg/dL — AB (ref 65–99)

## 2016-03-12 SURGERY — PHACOEMULSIFICATION, CATARACT, WITH IOL INSERTION
Anesthesia: LOCAL | Site: Eye | Laterality: Left | Wound class: Clean

## 2016-03-12 MED ORDER — EPINEPHRINE HCL 1 MG/ML IJ SOLN
INTRAMUSCULAR | Status: AC
  Filled 2016-03-12: qty 2

## 2016-03-12 MED ORDER — SODIUM CHLORIDE 0.9 % IV SOLN
INTRAVENOUS | Status: DC
  Administered 2016-03-12: 07:00:00 via INTRAVENOUS

## 2016-03-12 MED ORDER — TETRACAINE HCL 0.5 % OP SOLN
OPHTHALMIC | Status: DC | PRN
  Administered 2016-03-12: 1 [drp] via OPHTHALMIC

## 2016-03-12 MED ORDER — LIDOCAINE HCL (PF) 4 % IJ SOLN
INTRAMUSCULAR | Status: DC | PRN
  Administered 2016-03-12: 4 mL via OPHTHALMIC

## 2016-03-12 MED ORDER — ALFENTANIL 500 MCG/ML IJ INJ
INJECTION | INTRAMUSCULAR | Status: DC | PRN
  Administered 2016-03-12: 100 ug via INTRAVENOUS
  Administered 2016-03-12: 250 ug via INTRAVENOUS
  Administered 2016-03-12: 150 ug via INTRAVENOUS

## 2016-03-12 MED ORDER — MOXIFLOXACIN HCL 0.5 % OP SOLN
1.0000 [drp] | OPHTHALMIC | Status: DC | PRN
  Administered 2016-03-12 (×2): 1 [drp] via OPHTHALMIC

## 2016-03-12 MED ORDER — CEFUROXIME OPHTHALMIC INJECTION 1 MG/0.1 ML
INJECTION | OPHTHALMIC | Status: AC
  Filled 2016-03-12: qty 0.1

## 2016-03-12 MED ORDER — PHENYLEPHRINE HCL 10 % OP SOLN
OPHTHALMIC | Status: AC
Start: 2016-03-12 — End: 2016-03-12
  Administered 2016-03-12: 1 [drp] via OPHTHALMIC
  Filled 2016-03-12: qty 5

## 2016-03-12 MED ORDER — NA CHONDROIT SULF-NA HYALURON 40-17 MG/ML IO SOLN
INTRAOCULAR | Status: AC
  Filled 2016-03-12: qty 1

## 2016-03-12 MED ORDER — CARBACHOL 0.01 % IO SOLN
INTRAOCULAR | Status: DC | PRN
  Administered 2016-03-12: .5 mL via INTRAOCULAR

## 2016-03-12 MED ORDER — TETRACAINE HCL 0.5 % OP SOLN
OPHTHALMIC | Status: AC
  Filled 2016-03-12: qty 2

## 2016-03-12 MED ORDER — NA CHONDROIT SULF-NA HYALURON 40-17 MG/ML IO SOLN
INTRAOCULAR | Status: DC | PRN
  Administered 2016-03-12: 1 mL via INTRAOCULAR

## 2016-03-12 MED ORDER — HYALURONIDASE HUMAN 150 UNIT/ML IJ SOLN
INTRAMUSCULAR | Status: AC
  Filled 2016-03-12: qty 1

## 2016-03-12 MED ORDER — METOPROLOL TARTRATE 25 MG PO TABS
25.0000 mg | ORAL_TABLET | Freq: Once | ORAL | Status: AC
  Administered 2016-03-12: 25 mg via ORAL

## 2016-03-12 MED ORDER — BUPIVACAINE HCL (PF) 0.75 % IJ SOLN
INTRAMUSCULAR | Status: AC
  Filled 2016-03-12: qty 10

## 2016-03-12 MED ORDER — LIDOCAINE HCL (PF) 4 % IJ SOLN
INTRAMUSCULAR | Status: AC
Start: 2016-03-12 — End: 2016-03-12
  Filled 2016-03-12: qty 5

## 2016-03-12 MED ORDER — PHENYLEPHRINE HCL 10 % OP SOLN
1.0000 [drp] | OPHTHALMIC | Status: AC | PRN
  Administered 2016-03-12 (×4): 1 [drp] via OPHTHALMIC

## 2016-03-12 MED ORDER — POVIDONE-IODINE 5 % OP SOLN
OPHTHALMIC | Status: DC | PRN
  Administered 2016-03-12: 1 via OPHTHALMIC

## 2016-03-12 MED ORDER — MOXIFLOXACIN HCL 0.5 % OP SOLN
OPHTHALMIC | Status: DC | PRN
  Administered 2016-03-12: 1 [drp] via OPHTHALMIC

## 2016-03-12 MED ORDER — POVIDONE-IODINE 5 % OP SOLN
OPHTHALMIC | Status: AC
  Filled 2016-03-12: qty 30

## 2016-03-12 MED ORDER — MIDAZOLAM HCL 2 MG/2ML IJ SOLN
INTRAMUSCULAR | Status: DC | PRN
  Administered 2016-03-12: 1 mg via INTRAVENOUS

## 2016-03-12 MED ORDER — METOPROLOL TARTRATE 25 MG PO TABS
ORAL_TABLET | ORAL | Status: AC
  Administered 2016-03-12: 25 mg via ORAL
  Filled 2016-03-12: qty 1

## 2016-03-12 MED ORDER — EPINEPHRINE HCL 1 MG/ML IJ SOLN
INTRAMUSCULAR | Status: DC | PRN
  Administered 2016-03-12: 1 mL via OPHTHALMIC

## 2016-03-12 MED ORDER — LIDOCAINE HCL (PF) 4 % IJ SOLN
INTRAOCULAR | Status: DC | PRN
  Administered 2016-03-12: .5 mL via OPHTHALMIC

## 2016-03-12 MED ORDER — CYCLOPENTOLATE HCL 2 % OP SOLN
OPHTHALMIC | Status: AC
Start: 2016-03-12 — End: 2016-03-12
  Administered 2016-03-12: 1 [drp] via OPHTHALMIC
  Filled 2016-03-12: qty 2

## 2016-03-12 MED ORDER — CEFUROXIME OPHTHALMIC INJECTION 1 MG/0.1 ML
INJECTION | OPHTHALMIC | Status: DC | PRN
  Administered 2016-03-12: .1 mL via INTRACAMERAL

## 2016-03-12 MED ORDER — CYCLOPENTOLATE HCL 2 % OP SOLN
1.0000 [drp] | OPHTHALMIC | Status: DC | PRN
  Administered 2016-03-12 (×4): 1 [drp] via OPHTHALMIC

## 2016-03-12 MED ORDER — MOXIFLOXACIN HCL 0.5 % OP SOLN
OPHTHALMIC | Status: AC
Start: 2016-03-12 — End: 2016-03-12
  Administered 2016-03-12: 1 [drp] via OPHTHALMIC
  Filled 2016-03-12: qty 3

## 2016-03-12 SURGICAL SUPPLY — 31 items
CANNULA ANT/CHMB 27G (MISCELLANEOUS) ×1 IMPLANT
CANNULA ANT/CHMB 27GA (MISCELLANEOUS) ×2 IMPLANT
CORD BIP STRL DISP 12FT (MISCELLANEOUS) ×2 IMPLANT
CUP MEDICINE 2OZ PLAST GRAD ST (MISCELLANEOUS) ×2 IMPLANT
DRAPE XRAY CASSETTE 23X24 (DRAPES) ×2 IMPLANT
ERASER HMR WETFIELD 18G (MISCELLANEOUS) ×2 IMPLANT
GLOVE BIO SURGEON STRL SZ8 (GLOVE) ×2 IMPLANT
GLOVE SURG LX 6.5 MICRO (GLOVE) ×1
GLOVE SURG LX 8.0 MICRO (GLOVE) ×1
GLOVE SURG LX STRL 6.5 MICRO (GLOVE) ×1 IMPLANT
GLOVE SURG LX STRL 8.0 MICRO (GLOVE) ×1 IMPLANT
GOWN STRL REUS W/ TWL LRG LVL3 (GOWN DISPOSABLE) ×1 IMPLANT
GOWN STRL REUS W/ TWL XL LVL3 (GOWN DISPOSABLE) ×1 IMPLANT
GOWN STRL REUS W/TWL LRG LVL3 (GOWN DISPOSABLE) ×2
GOWN STRL REUS W/TWL XL LVL3 (GOWN DISPOSABLE) ×2
LENS IOL ACRSF IQ ULTRA 24.0 (Intraocular Lens) IMPLANT
LENS IOL ACRYSOF IQ 24.0 (Intraocular Lens) ×2 IMPLANT
PACK CATARACT (MISCELLANEOUS) ×2 IMPLANT
PACK CATARACT DINGLEDEIN LX (MISCELLANEOUS) ×2 IMPLANT
PACK EYE AFTER SURG (MISCELLANEOUS) ×2 IMPLANT
SHLD EYE VISITEC  UNIV (MISCELLANEOUS) ×2 IMPLANT
SOL BSS BAG (MISCELLANEOUS) ×2
SOL PREP PVP 2OZ (MISCELLANEOUS) ×2
SOLUTION BSS BAG (MISCELLANEOUS) ×1 IMPLANT
SOLUTION PREP PVP 2OZ (MISCELLANEOUS) ×1 IMPLANT
SUT SILK 5-0 (SUTURE) ×2 IMPLANT
SYR 3ML LL SCALE MARK (SYRINGE) ×2 IMPLANT
SYR 5ML LL (SYRINGE) ×2 IMPLANT
SYR TB 1ML 27GX1/2 LL (SYRINGE) ×2 IMPLANT
WATER STERILE IRR 1000ML POUR (IV SOLUTION) ×2 IMPLANT
WIPE NON LINTING 3.25X3.25 (MISCELLANEOUS) ×2 IMPLANT

## 2016-03-12 NOTE — Anesthesia Preprocedure Evaluation (Addendum)
Anesthesia Evaluation  Patient identified by MRN, date of birth, ID band Patient awake    Reviewed: Allergy & Precautions, NPO status , Patient's Chart, lab work & pertinent test results, reviewed documented beta blocker date and time   Airway Mallampati: II  TM Distance: >3 FB     Dental  (+) Chipped   Pulmonary shortness of breath, asthma , sleep apnea and Continuous Positive Airway Pressure Ventilation , Current Smoker,           Cardiovascular hypertension, Pt. on medications and Pt. on home beta blockers      Neuro/Psych CVA, Residual Symptoms    GI/Hepatic GERD  Controlled,  Endo/Other  diabetes, Type 2  Renal/GU      Musculoskeletal   Abdominal   Peds  Hematology   Anesthesia Other Findings Neck movement OK. Still has weakness on R. Uses crutch to walk. Does not use CPAP.  Reproductive/Obstetrics                            Anesthesia Physical Anesthesia Plan  ASA: III  Anesthesia Plan:    Post-op Pain Management:    Induction:   Airway Management Planned:   Additional Equipment:   Intra-op Plan:   Post-operative Plan:   Informed Consent: I have reviewed the patients History and Physical, chart, labs and discussed the procedure including the risks, benefits and alternatives for the proposed anesthesia with the patient or authorized representative who has indicated his/her understanding and acceptance.     Plan Discussed with: CRNA  Anesthesia Plan Comments:         Anesthesia Quick Evaluation

## 2016-03-12 NOTE — Op Note (Signed)
Date of Surgery: 03/12/2016 Date of Dictation: 03/12/2016 8:15 AM Pre-operative Diagnosis:  Nuclear sclerotic and posterior subcapsular cataract left Eye Post-operative Diagnosis: same Procedure performed: Extra-capsular Cataract Extraction (ECCE) with placement of a posterior chamber intraocular lens (IOL) left Eye IOL:  Implant Name Type Inv. Item Serial No. Manufacturer Lot No. LRB No. Used  LENS IOL ACRYSOF IQ 24.0 - [id number] Intraocular Lens LENS IOL ACRYSOF IQ 24.0 [id number] [id number] Left 1  LENS IOL ACRYSOF IQ 24.0 - [id number] Intraocular Lens LENS IOL ACRYSOF IQ 24.0 [id number] ALCON [id number] Left 1   Anesthesia: 2% Lidocaine and 4% Marcaine in a 50/50 mixture with 10 unites/ml of Hylenex given as a peribulbar Anesthesiologist: Anesthesiologist: Gunnar Bulla, MD CRNA: Demetrius Charity, CRNA Complications: none Estimated Blood Loss: less than 1 ml  Description of procedure:  The patient was given anesthesia and sedation via intravenous access. The patient was then prepped and draped in the usual fashion. A 25-gauge needle was bent for initiating the capsulorhexis. A 5-0 silk suture was placed through the conjunctiva superior and inferiorly to serve as bridle sutures. Hemostasis was obtained at the superior limbus using an eraser cautery. A partial thickness groove was made at the anterior surgical limbus with a 64 Beaver blade and this was dissected anteriorly with an Avaya. The anterior chamber was entered at 10 o'clock with a 1.0 mm paracentesis knife and through the lamellar dissection with a 2.6 mm Alcon keratome. Epi-Shugarcaine 0.5 CC [9 cc BSS Plus (Alcon), 3 cc 4% preservative-free lidocaine (Hospira) and 4 cc 1:1000 preservative-free, bisulfite-free epinephrine] was injected into the anterior chamber via the paracentesis tract. Epi-Shugarcaine 0.5 CC [9 cc BSS Plus (Alcon), 3 cc 4% preservative-free lidocaine (Hospira) and 4 cc 1:1000  preservative-free, bisulfite-free epinephrine] was injected into the anterior chamber via the paracentesis tract. DiscoVisc was injected to replace the aqueous and a continuous tear curvilinear capsulorhexis was performed using a bent 25-gauge needle.  Balance salt on a syringe was used to perform hydro-dissection and phacoemulsification was carried out using a divide and conquer technique. Procedure(s) with comments: CATARACT EXTRACTION PHACO AND INTRAOCULAR LENS PLACEMENT (IOC) (Left) - [place] 01:12 AP% 23.4 CDE 29.83 fluid pack lot # [id number]. Irrigation/aspiration was used to remove the residual cortex and the capsular bag was inflated with DiscoVisc. The intraocular lens was inserted into the capsular bag using a pre-loaded UltraSert Delivery System. Irrigation/aspiration was used to remove the residual DiscoVisc. The wound was inflated with balanced salt and checked for leaks. None were found. Miostat was injected via the paracentesis track and 0.1 ml of cefuroxime containing 1 mg of drug  was injected via the paracentesis track. The wound was checked for leaks again and none were found.   The bridal sutures were removed and two drops of Vigamox were placed on the eye. An eye shield was placed to protect the eye and the patient was discharged to the recovery area in good condition.   Kristi Hyer MD

## 2016-03-12 NOTE — Interval H&P Note (Signed)
History and Physical Interval Note:  03/12/2016 7:25 AM  Autumn Arnold  has presented today for surgery, with the diagnosis of nuclear sclerotic cataract left eye  The various methods of treatment have been discussed with the patient and family. After consideration of risks, benefits and other options for treatment, the patient has consented to  Procedure(s) with comments: CATARACT EXTRACTION PHACO AND INTRAOCULAR LENS PLACEMENT (IOC) (Left) - [place] AP% CDE fluid pack lot # [id number] as a surgical intervention .  The patient's history has been reviewed, patient examined, no change in status, stable for surgery.  I have reviewed the patient's chart and labs.  Questions were answered to the patient's satisfaction.     Camielle Sizer

## 2016-03-12 NOTE — Transfer of Care (Signed)
Immediate Anesthesia Transfer of Care Note  Patient: Autumn Arnold  Procedure(s) Performed: Procedure(s) with comments: CATARACT EXTRACTION PHACO AND INTRAOCULAR LENS PLACEMENT (IOC) (Left) - [place] 01:12 AP% 23.4 CDE 29.83 fluid pack lot # [id number]  Patient Location: PACU  Anesthesia Type:MAC  Level of Consciousness: awake, alert  and oriented  Airway & Oxygen Therapy: Patient Spontanous Breathing and Patient connected to nasal cannula oxygen  Post-op Assessment: Report given to RN and Post -op Vital signs reviewed and stable  Post vital signs: Reviewed and stable  Last Vitals:  Filed Vitals:   03/12/16 0619 03/12/16 0620  BP:  157/64  Pulse: 96   Temp: 37.5 C   Resp: 16     Last Pain:  Filed Vitals:   03/12/16 0620  PainSc: 0-No pain         Complications: No apparent anesthesia complications

## 2016-03-12 NOTE — Discharge Instructions (Signed)
See handout. Eye Surgery Discharge Instructions  Expect mild scratchy sensation or mild soreness. DO NOT RUB YOUR EYE!  The day of surgery:  Minimal physical activity, but bed rest is not required  No reading, computer work, or close hand work  No bending, lifting, or straining.  May watch TV  For 24 hours:  No driving, legal decisions, or alcoholic beverages  Safety precautions  Eat anything you prefer: It is better to start with liquids, then soup then solid foods.  _____ Eye patch should be worn until postoperative exam tomorrow.  ____ Solar shield eyeglasses should be worn for comfort in the sunlight/patch while sleeping  Resume all regular medications including aspirin or Coumadin if these were discontinued prior to surgery. You may shower, bathe, shave, or wash your hair. Tylenol may be taken for mild discomfort.  Call your doctor if you experience significant pain, nausea, or vomiting, fever > 101 or other signs of infection. [phone] or [phone] Specific instructions:  Follow-up Information    Follow up with Estill Cotta, MD.   Specialty:  Ophthalmology   Why:  03/13/16 at 9:50   Contact information:   [place] [address] [phone]      Eye Surgery Discharge Instructions  Expect mild scratchy sensation or mild soreness. DO NOT RUB YOUR EYE!  The day of surgery:  Minimal physical activity, but bed rest is not required  No reading, computer work, or close hand work  No bending, lifting, or straining.  May watch TV  For 24 hours:  No driving, legal decisions, or alcoholic beverages  Safety precautions  Eat anything you prefer: It is better to start with liquids, then soup then solid foods.  _____ Eye patch should be worn until postoperative exam tomorrow.  ____ Solar shield eyeglasses should be worn for comfort in the sunlight/patch while sleeping  Resume all regular medications including aspirin or  Coumadin if these were discontinued prior to surgery. You may shower, bathe, shave, or wash your hair. Tylenol may be taken for mild discomfort.  Call your doctor if you experience significant pain, nausea, or vomiting, fever > 101 or other signs of infection. [phone] or [phone] Specific instructions:  Follow-up Information    Follow up with Estill Cotta, MD.   Specialty:  Ophthalmology   Why:  03/13/16 at 9:50   Contact information:   [address]   [place] [address] [phone]

## 2016-03-12 NOTE — Anesthesia Procedure Notes (Signed)
Performed by: Mirren Gest Pre-anesthesia Checklist: Patient identified, Emergency Drugs available, Suction available, Patient being monitored and Timeout performed Oxygen Delivery Method: Nasal cannula       

## 2016-03-12 NOTE — Anesthesia Postprocedure Evaluation (Signed)
Anesthesia Post Note  Patient: Autumn Arnold  Procedure(s) Performed: Procedure(s) (LRB): CATARACT EXTRACTION PHACO AND INTRAOCULAR LENS PLACEMENT (IOC) (Left)  Patient location during evaluation: PACU Anesthesia Type: MAC Level of consciousness: awake and alert and oriented Pain management: satisfactory to patient Vital Signs Assessment: post-procedure vital signs reviewed and stable Respiratory status: respiratory function stable Cardiovascular status: stable Anesthetic complications: no    Last Vitals:  Filed Vitals:   03/12/16 0619 03/12/16 0620  BP:  157/64  Pulse: 96   Temp: 37.5 C   Resp: 16     Last Pain:  Filed Vitals:   03/12/16 0620  PainSc: 0-No pain                 Blima Singer

## 2016-03-14 ENCOUNTER — Ambulatory Visit: Payer: BLUE CROSS/BLUE SHIELD | Admitting: Occupational Therapy

## 2016-03-14 ENCOUNTER — Ambulatory Visit: Payer: Self-pay | Admitting: Physical Therapy

## 2016-03-19 ENCOUNTER — Ambulatory Visit: Payer: Self-pay | Admitting: Physical Therapy

## 2016-03-19 ENCOUNTER — Encounter: Payer: Self-pay | Admitting: Occupational Therapy

## 2016-03-21 ENCOUNTER — Encounter: Payer: Self-pay | Admitting: Occupational Therapy

## 2016-03-21 ENCOUNTER — Ambulatory Visit: Payer: Self-pay | Admitting: Physical Therapy

## 2016-03-26 ENCOUNTER — Ambulatory Visit: Payer: Self-pay | Admitting: Physical Therapy

## 2016-03-26 ENCOUNTER — Encounter: Payer: Self-pay | Admitting: Occupational Therapy

## 2016-03-28 ENCOUNTER — Encounter: Payer: Self-pay | Admitting: Occupational Therapy

## 2016-03-28 ENCOUNTER — Ambulatory Visit: Payer: Self-pay | Admitting: Physical Therapy

## 2016-04-03 ENCOUNTER — Ambulatory Visit: Payer: Self-pay | Admitting: Physical Therapy

## 2016-04-03 ENCOUNTER — Encounter: Payer: Self-pay | Admitting: Occupational Therapy

## 2016-04-05 ENCOUNTER — Encounter: Payer: Self-pay | Admitting: Occupational Therapy

## 2016-04-05 ENCOUNTER — Ambulatory Visit: Payer: Self-pay | Admitting: Physical Therapy

## 2016-04-06 ENCOUNTER — Ambulatory Visit: Payer: BLUE CROSS/BLUE SHIELD | Attending: Family Medicine | Admitting: Physical Therapy

## 2016-04-06 DIAGNOSIS — M6281 Muscle weakness (generalized): Secondary | ICD-10-CM | POA: Diagnosis not present

## 2016-04-06 DIAGNOSIS — R262 Difficulty in walking, not elsewhere classified: Secondary | ICD-10-CM | POA: Diagnosis present

## 2016-04-07 NOTE — Therapy (Signed)
[facility] [address] [place], [place], [address] Phone: [phone]   Fax:  [phone]  Physical Therapy Evaluation  Patient Details  Name: Autumn Arnold MRN: [id number] Date of Birth: [birth year] Referring Provider: Dr. Netty Starring  Encounter Date: 04/06/2016      PT End of Session - 04/07/16 2024    Visit Number 1   Number of Visits 1   Date for PT Re-Evaluation 04/07/16   PT Start Time 0724   PT Stop Time 0918   PT Time Calculation (min) 114 min   Behavior During Therapy [facility] for tasks assessed/performed      Past Medical History  Diagnosis Date  . Diabetes mellitus 2008  . Hyperlipidemia   . Hypertension   . Stroke [facility]) 02/2010    headache, left arm numbness  . Sleep apnea     CPAP at [facility], Dr. Humphrey Rolls  . Asthma   . Shortness of breath dyspnea     DOE  . GERD (gastroesophageal reflux disease)   . Cough     CHRONIC  . Edema     LEGS/FEET  . Wheezing     Past Surgical History  Procedure Laterality Date  . Anterior fusion cervical spine    . Elbow surgery      Tendonitis  . Carpal tunnel release      Bilateral  . Incontinence surgery    . Abdominal hysterectomy    . Tonsillectomy    . Cesarean section    . Cataract extraction w/phaco Left 03/12/2016    Procedure: CATARACT EXTRACTION PHACO AND INTRAOCULAR LENS PLACEMENT (IOC);  Surgeon: Estill Cotta, MD;  Location: [facility] ORS;  Service: Ophthalmology;  Laterality: Left;  [place] 01:12 AP% 23.4 CDE 29.83 fluid pack lot # [id number]    There were no vitals filed for this visit.       Subjective Assessment - 04/07/16 2024    Subjective See FCE   Patient Stated Goals Patient wants to be able to walk better.    Currently in Pain? No/denies            [facility] PT Assessment - 04/07/16 0001    Assessment   Medical Diagnosis CVA   Referring Provider Dr. Netty Starring   Onset Date/Surgical Date 11/07/15   Hand Dominance Left   Prior Therapy see notes            PT Long Term Goals - 02/15/16 1033    PT LONG TERM GOAL #1   Title Patient will be independent in home exercise program to improve strength/mobility for better functional independence with ADLs.   Time 12   Period Weeks   Status Achieved   PT LONG TERM GOAL #2   Title Patient (> 59 years old) will complete five times sit to stand test in < 15 seconds indicating an increased LE strength and improved balance   Time 12   Period Weeks   Status Achieved   PT LONG TERM GOAL #3   Title Patient will increase six minute walk test distance to >1000 for progression to community ambulator and improve gait ability   Time 12   Period Weeks   Status Partially Met   PT LONG TERM GOAL #4   Title Patient will reduce timed up and go to <11 seconds to reduce fall risk and demonstrate improved transfer/gait ability   Time 12   Period Weeks   Status Achieved  Plan - 04/07/16 2026    Clinical Impression Statement See FCE   Clinical Impairments Affecting Rehab Potential numbness, weakness, loftstrand crutch, diabetes, circulation problems, left eye blurry  : patients clinical presentation is stable   PT Frequency 1x / week   PT Treatment/Interventions Therapeutic exercise;Therapeutic activities;Stair training;Gait training;Balance training;Neuromuscular re-education   PT Next Visit Plan FCE only      Patient will benefit from skilled therapeutic intervention in order to improve the following deficits and impairments:  Abnormal gait, Decreased coordination, Difficulty walking, Decreased endurance, Impaired vision/preception, Decreased activity tolerance, Decreased balance, Decreased scar mobility, Impaired sensation, Decreased strength  Visit Diagnosis: Muscle weakness (generalized)  Difficulty walking     Problem List Patient Active Problem List   Diagnosis Date Noted  . CVA (cerebral infarction) 11/07/2015   Pura Spice, PT, DPT # [phone]   04/07/2016, 8:28  PM  [facility] [address] [place], [place], [address] Phone: [phone]   Fax:  [phone]  Name: Autumn Arnold MRN: [id number] Date of Birth: 10-14-1951

## 2016-04-08 NOTE — Addendum Note (Signed)
Addended by: Pura Spice on: 04/08/2016 10:33 AM   Modules accepted: Orders

## 2016-04-15 NOTE — H&P (Signed)
See scanned note.

## 2016-04-16 ENCOUNTER — Ambulatory Visit: Payer: BLUE CROSS/BLUE SHIELD | Admitting: Anesthesiology

## 2016-04-16 ENCOUNTER — Encounter
Admission: RE | Disposition: A | Discharge status: Home / Self Care | Payer: Self-pay | Source: Ambulatory Visit | Attending: Ophthalmology

## 2016-04-16 ENCOUNTER — Ambulatory Visit
Admission: RE | Admit: 2016-04-16 | Discharge: 2016-04-16 | Disposition: A | Discharge status: Home / Self Care | Payer: BLUE CROSS/BLUE SHIELD | Source: Ambulatory Visit | Attending: Ophthalmology | Admitting: Ophthalmology

## 2016-04-16 DIAGNOSIS — E119 Type 2 diabetes mellitus without complications: Secondary | ICD-10-CM | POA: Insufficient documentation

## 2016-04-16 DIAGNOSIS — H2511 Age-related nuclear cataract, right eye: Secondary | ICD-10-CM | POA: Diagnosis not present

## 2016-04-16 DIAGNOSIS — G4730 Sleep apnea, unspecified: Secondary | ICD-10-CM | POA: Insufficient documentation

## 2016-04-16 DIAGNOSIS — F17200 Nicotine dependence, unspecified, uncomplicated: Secondary | ICD-10-CM | POA: Insufficient documentation

## 2016-04-16 DIAGNOSIS — K219 Gastro-esophageal reflux disease without esophagitis: Secondary | ICD-10-CM | POA: Insufficient documentation

## 2016-04-16 DIAGNOSIS — I69351 Hemiplegia and hemiparesis following cerebral infarction affecting right dominant side: Secondary | ICD-10-CM | POA: Diagnosis not present

## 2016-04-16 DIAGNOSIS — R05 Cough: Secondary | ICD-10-CM | POA: Diagnosis not present

## 2016-04-16 DIAGNOSIS — M7989 Other specified soft tissue disorders: Secondary | ICD-10-CM | POA: Insufficient documentation

## 2016-04-16 DIAGNOSIS — R062 Wheezing: Secondary | ICD-10-CM | POA: Insufficient documentation

## 2016-04-16 DIAGNOSIS — J449 Chronic obstructive pulmonary disease, unspecified: Secondary | ICD-10-CM | POA: Insufficient documentation

## 2016-04-16 DIAGNOSIS — Z85828 Personal history of other malignant neoplasm of skin: Secondary | ICD-10-CM | POA: Insufficient documentation

## 2016-04-16 DIAGNOSIS — Z90710 Acquired absence of both cervix and uterus: Secondary | ICD-10-CM | POA: Diagnosis not present

## 2016-04-16 DIAGNOSIS — M1990 Unspecified osteoarthritis, unspecified site: Secondary | ICD-10-CM | POA: Insufficient documentation

## 2016-04-16 DIAGNOSIS — F329 Major depressive disorder, single episode, unspecified: Secondary | ICD-10-CM | POA: Insufficient documentation

## 2016-04-16 DIAGNOSIS — E7800 Pure hypercholesterolemia, unspecified: Secondary | ICD-10-CM | POA: Insufficient documentation

## 2016-04-16 DIAGNOSIS — I10 Essential (primary) hypertension: Secondary | ICD-10-CM | POA: Diagnosis not present

## 2016-04-16 DIAGNOSIS — Z9842 Cataract extraction status, left eye: Secondary | ICD-10-CM | POA: Insufficient documentation

## 2016-04-16 HISTORY — PX: CATARACT EXTRACTION W/PHACO: SHX586

## 2016-04-16 LAB — GLUCOSE, CAPILLARY: Glucose-Capillary: 131 mg/dL — ABNORMAL HIGH (ref 65–99)

## 2016-04-16 SURGERY — PHACOEMULSIFICATION, CATARACT, WITH IOL INSERTION
Anesthesia: Monitor Anesthesia Care | Site: Eye | Laterality: Right | Wound class: Clean

## 2016-04-16 MED ORDER — CYCLOPENTOLATE HCL 2 % OP SOLN
1.0000 [drp] | OPHTHALMIC | Status: AC | PRN
  Administered 2016-04-16 (×4): 1 [drp] via OPHTHALMIC

## 2016-04-16 MED ORDER — MOXIFLOXACIN HCL 0.5 % OP SOLN
OPHTHALMIC | Status: DC | PRN
  Administered 2016-04-16: 1 [drp] via OPHTHALMIC

## 2016-04-16 MED ORDER — CYCLOPENTOLATE HCL 2 % OP SOLN
OPHTHALMIC | Status: AC
  Administered 2016-04-16: 1 [drp] via OPHTHALMIC
  Filled 2016-04-16: qty 2

## 2016-04-16 MED ORDER — CEFUROXIME OPHTHALMIC INJECTION 1 MG/0.1 ML
INJECTION | OPHTHALMIC | Status: AC
  Filled 2016-04-16: qty 0.1

## 2016-04-16 MED ORDER — TETRACAINE HCL 0.5 % OP SOLN
OPHTHALMIC | Status: DC | PRN
  Administered 2016-04-16: 1 [drp] via OPHTHALMIC

## 2016-04-16 MED ORDER — ALFENTANIL 500 MCG/ML IJ INJ
INJECTION | INTRAMUSCULAR | Status: DC | PRN
  Administered 2016-04-16: 600 ug via INTRAVENOUS

## 2016-04-16 MED ORDER — LIDOCAINE HCL (PF) 4 % IJ SOLN
INTRAMUSCULAR | Status: DC | PRN
  Administered 2016-04-16: 08:00:00 via OPHTHALMIC

## 2016-04-16 MED ORDER — NA CHONDROIT SULF-NA HYALURON 40-17 MG/ML IO SOLN
INTRAOCULAR | Status: AC
  Filled 2016-04-16: qty 1

## 2016-04-16 MED ORDER — NA CHONDROIT SULF-NA HYALURON 40-17 MG/ML IO SOLN
INTRAOCULAR | Status: DC | PRN
  Administered 2016-04-16: 1 mL via INTRAOCULAR

## 2016-04-16 MED ORDER — METOPROLOL TARTRATE 25 MG PO TABS
25.0000 mg | ORAL_TABLET | Freq: Once | ORAL | Status: AC
  Administered 2016-04-16: 25 mg via ORAL

## 2016-04-16 MED ORDER — MOXIFLOXACIN HCL 0.5 % OP SOLN
1.0000 [drp] | OPHTHALMIC | Status: AC | PRN
  Administered 2016-04-16 (×2): 1 [drp] via OPHTHALMIC

## 2016-04-16 MED ORDER — MOXIFLOXACIN HCL 0.5 % OP SOLN
1.0000 [drp] | OPHTHALMIC | Status: AC | PRN
  Administered 2016-04-16: 1 [drp] via OPHTHALMIC

## 2016-04-16 MED ORDER — CARBACHOL 0.01 % IO SOLN
INTRAOCULAR | Status: DC | PRN
  Administered 2016-04-16: 0.5 mL via INTRAOCULAR

## 2016-04-16 MED ORDER — EPINEPHRINE HCL 1 MG/ML IJ SOLN
INTRAMUSCULAR | Status: AC
  Filled 2016-04-16: qty 1

## 2016-04-16 MED ORDER — PHENYLEPHRINE HCL 10 % OP SOLN
1.0000 [drp] | OPHTHALMIC | Status: AC | PRN
  Administered 2016-04-16 (×4): 1 [drp] via OPHTHALMIC

## 2016-04-16 MED ORDER — MOXIFLOXACIN HCL 0.5 % OP SOLN
OPHTHALMIC | Status: AC
Start: 2016-04-16 — End: 2016-04-16
  Administered 2016-04-16: 1 [drp] via OPHTHALMIC
  Filled 2016-04-16: qty 3

## 2016-04-16 MED ORDER — MIDAZOLAM HCL 2 MG/2ML IJ SOLN
INTRAMUSCULAR | Status: DC | PRN
  Administered 2016-04-16: .5 mg via INTRAVENOUS

## 2016-04-16 MED ORDER — TETRACAINE HCL 0.5 % OP SOLN
OPHTHALMIC | Status: AC
  Filled 2016-04-16: qty 2

## 2016-04-16 MED ORDER — LIDOCAINE HCL (PF) 4 % IJ SOLN
INTRAMUSCULAR | Status: AC
  Filled 2016-04-16: qty 5

## 2016-04-16 MED ORDER — POVIDONE-IODINE 5 % OP SOLN
OPHTHALMIC | Status: DC | PRN
  Administered 2016-04-16: 1 via OPHTHALMIC

## 2016-04-16 MED ORDER — SODIUM CHLORIDE 0.9 % IV SOLN
INTRAVENOUS | Status: DC
  Administered 2016-04-16 (×2): via INTRAVENOUS

## 2016-04-16 MED ORDER — BUPIVACAINE HCL (PF) 0.75 % IJ SOLN
INTRAMUSCULAR | Status: AC
  Filled 2016-04-16: qty 10

## 2016-04-16 MED ORDER — CEFUROXIME OPHTHALMIC INJECTION 1 MG/0.1 ML
INJECTION | OPHTHALMIC | Status: DC | PRN
  Administered 2016-04-16: 0.1 mL via INTRACAMERAL

## 2016-04-16 MED ORDER — METOPROLOL TARTRATE 25 MG PO TABS
ORAL_TABLET | ORAL | Status: AC
  Administered 2016-04-16: 25 mg via ORAL
  Filled 2016-04-16: qty 1

## 2016-04-16 MED ORDER — HYALURONIDASE HUMAN 150 UNIT/ML IJ SOLN
INTRAMUSCULAR | Status: AC
  Filled 2016-04-16: qty 1

## 2016-04-16 MED ORDER — POVIDONE-IODINE 5 % OP SOLN
OPHTHALMIC | Status: AC
  Filled 2016-04-16: qty 30

## 2016-04-16 MED ORDER — BSS IO SOLN
INTRAOCULAR | Status: DC | PRN
  Administered 2016-04-16: 08:00:00 via OPHTHALMIC

## 2016-04-16 MED ORDER — METOPROLOL TARTRATE 25 MG/10 ML ORAL SUSPENSION: 25.0000 mg | Freq: Once | ORAL | Status: DC

## 2016-04-16 MED ORDER — LIDOCAINE HCL (PF) 4 % IJ SOLN
INTRAMUSCULAR | Status: AC
Start: 2016-04-16 — End: 2016-04-16
  Filled 2016-04-16: qty 5

## 2016-04-16 MED ORDER — PHENYLEPHRINE HCL 10 % OP SOLN
OPHTHALMIC | Status: AC
Start: 2016-04-16 — End: 2016-04-16
  Administered 2016-04-16: 1 [drp] via OPHTHALMIC
  Filled 2016-04-16: qty 5

## 2016-04-16 SURGICAL SUPPLY — 31 items
CANNULA ANT/CHMB 27G (MISCELLANEOUS) ×1 IMPLANT
CANNULA ANT/CHMB 27GA (MISCELLANEOUS) ×3 IMPLANT
CORD BIP STRL DISP 12FT (MISCELLANEOUS) ×3 IMPLANT
CUP MEDICINE 2OZ PLAST GRAD ST (MISCELLANEOUS) ×3 IMPLANT
DRAPE XRAY CASSETTE 23X24 (DRAPES) ×3 IMPLANT
ERASER HMR WETFIELD 18G (MISCELLANEOUS) ×3 IMPLANT
GLOVE BIO SURGEON STRL SZ8 (GLOVE) ×3 IMPLANT
GLOVE SURG LX 6.5 MICRO (GLOVE) ×2
GLOVE SURG LX 8.0 MICRO (GLOVE) ×2
GLOVE SURG LX STRL 6.5 MICRO (GLOVE) ×1 IMPLANT
GLOVE SURG LX STRL 8.0 MICRO (GLOVE) ×1 IMPLANT
GOWN STRL REUS W/ TWL LRG LVL3 (GOWN DISPOSABLE) ×1 IMPLANT
GOWN STRL REUS W/ TWL XL LVL3 (GOWN DISPOSABLE) ×1 IMPLANT
GOWN STRL REUS W/TWL LRG LVL3 (GOWN DISPOSABLE) ×3
GOWN STRL REUS W/TWL XL LVL3 (GOWN DISPOSABLE) ×3
LENS IOL ACRSF IQ ULTRA 23.5 (Intraocular Lens) IMPLANT
LENS IOL ACRYSOF IQ 23.5 (Intraocular Lens) ×3 IMPLANT
PACK CATARACT (MISCELLANEOUS) ×3 IMPLANT
PACK CATARACT DINGLEDEIN LX (MISCELLANEOUS) ×3 IMPLANT
PACK EYE AFTER SURG (MISCELLANEOUS) ×3 IMPLANT
SHLD EYE VISITEC  UNIV (MISCELLANEOUS) ×3 IMPLANT
SOL BSS BAG (MISCELLANEOUS) ×3
SOL PREP PVP 2OZ (MISCELLANEOUS) ×3
SOLUTION BSS BAG (MISCELLANEOUS) ×1 IMPLANT
SOLUTION PREP PVP 2OZ (MISCELLANEOUS) ×1 IMPLANT
SUT SILK 5-0 (SUTURE) ×3 IMPLANT
SYR 3ML LL SCALE MARK (SYRINGE) ×3 IMPLANT
SYR 5ML LL (SYRINGE) ×3 IMPLANT
SYR TB 1ML 27GX1/2 LL (SYRINGE) ×3 IMPLANT
WATER STERILE IRR 1000ML POUR (IV SOLUTION) ×3 IMPLANT
WIPE NON LINTING 3.25X3.25 (MISCELLANEOUS) ×3 IMPLANT

## 2016-04-16 NOTE — Interval H&P Note (Signed)
History and Physical Interval Note:  04/16/2016 7:27 AM  Autumn Arnold  has presented today for surgery, with the diagnosis of cataract  The various methods of treatment have been discussed with the patient and family. After consideration of risks, benefits and other options for treatment, the patient has consented to  Procedure(s): CATARACT EXTRACTION PHACO AND INTRAOCULAR LENS PLACEMENT ([place]) (Right) as a surgical intervention .  The patient's history has been reviewed, patient examined, no change in status, stable for surgery.  I have reviewed the patient's chart and labs.  Questions were answered to the patient's satisfaction.     [name]

## 2016-04-16 NOTE — Discharge Instructions (Signed)
Eye Surgery Discharge Instructions  Expect mild scratchy sensation or mild soreness. DO NOT RUB YOUR EYE!  The day of surgery:  Minimal physical activity, but bed rest is not required  No reading, computer work, or close hand work  No bending, lifting, or straining.  May watch TV  For 24 hours:  No driving, legal decisions, or alcoholic beverages  Safety precautions  Eat anything you prefer: It is better to start with liquids, then soup then solid foods.  _____ Eye patch should be worn until postoperative exam tomorrow.  ____ Solar shield eyeglasses should be worn for comfort in the sunlight/patch while sleeping  Resume all regular medications including aspirin or Coumadin if these were discontinued prior to surgery. You may shower, bathe, shave, or wash your hair. Tylenol may be taken for mild discomfort.  Call your doctor if you experience significant pain, nausea, or vomiting, fever > 101 or other signs of infection. [phone] or [phone] Specific instructions:  Follow-up Information    Follow up with Ivette Castronova, MD In 1 day.   Specialty:  Ophthalmology   Why:  June 13 at [facility] information:   [address]   [place] [address] [phone]

## 2016-04-16 NOTE — Anesthesia Postprocedure Evaluation (Signed)
Anesthesia Post Note  Patient: Autumn Arnold  Procedure(s) Performed: Procedure(s) (LRB): CATARACT EXTRACTION PHACO AND INTRAOCULAR LENS PLACEMENT (IOC) (Right)  Patient location during evaluation: PACU Anesthesia Type: MAC Level of consciousness: awake Pain management: pain level controlled Vital Signs Assessment: post-procedure vital signs reviewed and stable Respiratory status: spontaneous breathing Cardiovascular status: blood pressure returned to baseline Postop Assessment: no headache Anesthetic complications: no    Last Vitals:  Filed Vitals:   04/16/16 0654  BP: 129/58  Pulse: 96  Temp: 36.6 C  Resp: 18    Last Pain: There were no vitals filed for this visit.               Buckner Malta

## 2016-04-16 NOTE — Anesthesia Procedure Notes (Signed)
Procedure Name: MAC Date/Time: 04/16/2016 8:15 AM Performed by: Allean Found Pre-anesthesia Checklist: Patient identified, Emergency Drugs available, Suction available, Patient being monitored and Timeout performed Patient Re-evaluated:Patient Re-evaluated prior to inductionOxygen Delivery Method: Nasal cannula

## 2016-04-16 NOTE — Transfer of Care (Signed)
Immediate Anesthesia Transfer of Care Note  Patient: Autumn Arnold  Procedure(s) Performed: Procedure(s) with comments: CATARACT EXTRACTION PHACO AND INTRAOCULAR LENS PLACEMENT (IOC) (Right) - [place] 01:17 AP% 22.5 CDE 35.08 Fluid pack lot # [id number]  Patient Location: PACU  Anesthesia Type:MAC  Level of Consciousness: awake  Airway & Oxygen Therapy: Patient Spontanous Breathing and Patient connected to nasal cannula oxygen  Post-op Assessment: Report given to RN and Post -op Vital signs reviewed and stable  Post vital signs: Reviewed and stable  Last Vitals:  Filed Vitals:   04/16/16 0654  BP: 129/58  Pulse: 96  Temp: 36.6 C  Resp: 18    Last Pain: There were no vitals filed for this visit.       Complications: No apparent anesthesia complications

## 2016-04-16 NOTE — Anesthesia Preprocedure Evaluation (Signed)
Anesthesia Evaluation  Patient identified by MRN, date of birth, ID band Patient awake    Reviewed: Allergy & Precautions, NPO status , Patient's Chart, lab work & pertinent test results  History of Anesthesia Complications Negative for: history of anesthetic complications  Airway Mallampati: II       Dental   Pulmonary shortness of breath, asthma , sleep apnea , Current Smoker,           Cardiovascular hypertension, Pt. on medications      Neuro/Psych CVA (r sided weakness), Residual Symptoms    GI/Hepatic Neg liver ROS, GERD  Medicated,  Endo/Other  diabetes, Type 2, Oral Hypoglycemic Agents  Renal/GU negative Renal ROS     Musculoskeletal   Abdominal   Peds  Hematology negative hematology ROS (+)   Anesthesia Other Findings   Reproductive/Obstetrics                             Anesthesia Physical Anesthesia Plan  ASA: III  Anesthesia Plan: MAC   Post-op Pain Management:    Induction:   Airway Management Planned: Nasal Cannula  Additional Equipment:   Intra-op Plan:   Post-operative Plan:   Informed Consent: I have reviewed the patients History and Physical, chart, labs and discussed the procedure including the risks, benefits and alternatives for the proposed anesthesia with the patient or authorized representative who has indicated his/her understanding and acceptance.     Plan Discussed with:   Anesthesia Plan Comments:         Anesthesia Quick Evaluation

## 2016-04-16 NOTE — Op Note (Signed)
Date of Surgery: 04/16/2016 Date of Dictation: 04/16/2016 8:50 AM Pre-operative Diagnosis:  Nuclear Sclerotic Cataract right Eye Post-operative Diagnosis: same Procedure performed: Extra-capsular Cataract Extraction (ECCE) with placement of a posterior chamber intraocular lens (IOL) right Eye IOL:  Implant Name Type Inv. Item Serial No. Manufacturer Lot No. LRB No. Used  LENS IOL ACRYSOF IQ 23.5 - [id number] Intraocular Lens LENS IOL ACRYSOF IQ 23.5 [id number] ALCON   Right 1   Anesthesia: 2% Lidocaine and 4% Marcaine in a 50/50 mixture with 10 unites/ml of Hylenex given as a peribulbar Anesthesiologist: Anesthesiologist: Gunnar Fusi, MD CRNA: Allean Found, CRNA; Lance Muss, CRNA Complications: none Estimated Blood Loss: less than 1 ml  Description of procedure:  The patient was given anesthesia and sedation via intravenous access. The patient was then prepped and draped in the usual fashion. A 25-gauge needle was bent for initiating the capsulorhexis. A 5-0 silk suture was placed through the conjunctiva superior and inferiorly to serve as bridle sutures. Hemostasis was obtained at the superior limbus using an eraser cautery. A partial thickness groove was made at the anterior surgical limbus with a 64 Beaver blade and this was dissected anteriorly with an Avaya. The anterior chamber was entered at 10 o'clock with a 1.0 mm paracentesis knife and through the lamellar dissection with a 2.6 mm Alcon keratome. Epi-Shugarcaine 0.5 CC [9 cc BSS Plus (Alcon), 3 cc 4% preservative-free lidocaine (Hospira) and 4 cc 1:1000 preservative-free, bisulfite-free epinephrine] was injected into the anterior chamber via the paracentesis tract. Epi-Shugarcaine 0.5 CC [9 cc BSS Plus (Alcon), 3 cc 4% preservative-free lidocaine (Hospira) and 4 cc 1:1000 preservative-free, bisulfite-free epinephrine] was injected into the anterior chamber via the paracentesis tract. DiscoVisc was injected to replace  the aqueous and a continuous tear curvilinear capsulorhexis was performed using a bent 25-gauge needle.  Balance salt on a syringe was used to perform hydro-dissection and phacoemulsification was carried out using a divide and conquer technique. Procedure(s) with comments: CATARACT EXTRACTION PHACO AND INTRAOCULAR LENS PLACEMENT (IOC) (Right) - [place] 01:17 AP% 22.5 CDE 35.08 Fluid pack lot # [id number]. Irrigation/aspiration was used to remove the residual cortex and the capsular bag was inflated with DiscoVisc. The intraocular lens was inserted into the capsular bag using a pre-loaded UltraSert Delivery System. Irrigation/aspiration was used to remove the residual DiscoVisc. The wound was inflated with balanced salt and checked for leaks. None were found. Miostat was injected via the paracentesis track and 0.1 ml of cefuroxime containing 1 mg of drug  was injected via the paracentesis track. The wound was checked for leaks again and none were found.   The bridal sutures were removed and two drops of Vigamox were placed on the eye. An eye shield was placed to protect the eye and the patient was discharged to the recovery area in good condition.   Jermey Closs MD

## 2016-05-24 ENCOUNTER — Other Ambulatory Visit: Payer: Self-pay | Admitting: Orthopedic Surgery

## 2016-05-24 DIAGNOSIS — M25571 Pain in right ankle and joints of right foot: Secondary | ICD-10-CM

## 2016-06-04 ENCOUNTER — Ambulatory Visit: Payer: BLUE CROSS/BLUE SHIELD | Admitting: Physical Therapy

## 2016-06-06 ENCOUNTER — Encounter: Payer: Self-pay | Admitting: Physical Therapy

## 2016-06-06 ENCOUNTER — Ambulatory Visit: Payer: BLUE CROSS/BLUE SHIELD | Attending: Family Medicine | Admitting: Physical Therapy

## 2016-06-06 DIAGNOSIS — R2681 Unsteadiness on feet: Secondary | ICD-10-CM | POA: Insufficient documentation

## 2016-06-06 DIAGNOSIS — M6281 Muscle weakness (generalized): Secondary | ICD-10-CM | POA: Insufficient documentation

## 2016-06-06 DIAGNOSIS — R262 Difficulty in walking, not elsewhere classified: Secondary | ICD-10-CM | POA: Diagnosis present

## 2016-06-06 NOTE — Patient Instructions (Signed)
STS without UE support 2x10. Created on [email or web address]

## 2016-06-06 NOTE — Therapy (Signed)
[facility] [address] [place], [place], [address] Phone: [phone]   Fax:  [phone]  Physical Therapy Evaluation  Patient Details  Name: Autumn Arnold MRN: [id number] Date of Birth: 1950-12-08 Referring Provider: Dr. Netty Starring  Encounter Date: 06/06/2016      PT End of Session - 06/06/16 1237    Visit Number 1   Number of Visits 9   Date for PT Re-Evaluation 07/04/16   PT Start Time 1132   PT Stop Time 1212   PT Time Calculation (min) 40 min   Equipment Utilized During Treatment Gait belt   Activity Tolerance Patient tolerated treatment well   Behavior During Therapy [facility] for tasks assessed/performed      Past Medical History:  Diagnosis Date  . Asthma   . Cough    CHRONIC  . Diabetes mellitus 2008  . Edema    LEGS/FEET  . GERD (gastroesophageal reflux disease)   . Hyperlipidemia   . Hypertension   . Shortness of breath dyspnea    DOE  . Sleep apnea    CPAP at [facility], Dr. Humphrey Rolls  . Stroke [facility]) 02/2010   headache, left arm numbness  . Wheezing     Past Surgical History:  Procedure Laterality Date  . ABDOMINAL HYSTERECTOMY    . ANTERIOR FUSION CERVICAL SPINE    . CARPAL TUNNEL RELEASE     Bilateral  . CATARACT EXTRACTION W/PHACO Left 03/12/2016   Procedure: CATARACT EXTRACTION PHACO AND INTRAOCULAR LENS PLACEMENT (IOC);  Surgeon: Estill Cotta, MD;  Location: [facility] ORS;  Service: Ophthalmology;  Laterality: Left;  [place] 01:12 AP% 23.4 CDE 29.83 fluid pack lot # [id number]  . CATARACT EXTRACTION W/PHACO Right 04/16/2016   Procedure: CATARACT EXTRACTION PHACO AND INTRAOCULAR LENS PLACEMENT (IOC);  Surgeon: Estill Cotta, MD;  Location: [facility] ORS;  Service: Ophthalmology;  Laterality: Right;  [place] 01:17 AP% 22.5 CDE 35.08 Fluid pack lot # [id number]  . CESAREAN SECTION    . ELBOW SURGERY     Tendonitis  . INCONTINENCE SURGERY    . TONSILLECTOMY      There were no vitals filed for this visit.       Subjective Assessment - 06/06/16 1141    Subjective Pt has a history of CVA on November 06, 2015. She c/o continued difficulty with walking and balance.   Pertinent History Pt has had physical therapy after her CVA with R sided weakness in January and improved, but feels she still needs some improvement with her walking and balance.   Limitations Standing;Walking   How long can you stand comfortably? 1 to 2 hours   How long can you walk comfortably? limited community distances   Patient Stated Goals Patient wants to be able to walk better.    Currently in Pain? No/denies            [facility] PT Assessment - 06/06/16 0001      Assessment   Medical Diagnosis CVA   Referring Provider Dr. Netty Starring   Onset Date/Surgical Date 11/07/15   Hand Dominance Left   Prior Therapy physical therapy for CVA     Precautions   Precautions None     Restrictions   Weight Bearing Restrictions No     Balance Screen   Has the patient fallen in the past 6 months Yes   How many times? 1   Has the patient had a decrease in activity level because of a fear of falling?  Yes   Is the patient reluctant to leave their home because of a fear of falling?  No     Home Environment   Living Environment Private residence   Living Arrangements Spouse/significant other   Available Help at Discharge Family   Type of [place] to enter   Entrance Stairs-Number of Steps 1 to 3   Entrance Stairs-Rails None   Home Layout One level   [place] Other (comment)  loftstrand crutch     Prior Function   Level of Independence Independent   Vocation On disability;Full time employment   Vocation Requirements standing and walking and carrying     Cognition   Overall Cognitive Status Within Functional Limits for tasks assessed   Attention Focused     Sensation   Light Touch Appears Intact   Proprioception Appears Intact     Coordination   Heel Shin Test [facility]     Standardized Balance  Assessment   Standardized Balance Assessment Berg Balance Test   Five times sit to stand comments  12.85 sec   10 Meter Walk 0.82 m/s with loft strand crutch     Berg Balance Test   Sit to Stand Able to stand without using hands and stabilize independently   Standing Unsupported Able to stand safely 2 minutes   Sitting with Back Unsupported but Feet Supported on Floor or Stool Able to sit safely and securely 2 minutes   Stand to Sit Sits safely with minimal use of hands   Transfers Able to transfer safely, minor use of hands   Standing Unsupported with Eyes Closed Able to stand 10 seconds with supervision   Standing Ubsupported with Feet Together Able to place feet together independently and stand 1 minute safely   From Standing, Reach Forward with Outstretched Arm Can reach forward >12 cm safely (5")   From Standing Position, Pick up Object from Floor Able to pick up shoe safely and easily   From Standing Position, Turn to Look Behind Over each Shoulder Looks behind one side only/other side shows less weight shift   Turn 360 Degrees Able to turn 360 degrees safely but slowly   Standing Unsupported, Alternately Place Feet on Step/Stool Able to stand independently and safely and complete 8 steps in 20 seconds   Standing Unsupported, One Foot in [employer] balance while stepping or standing   Standing on One Leg Able to lift leg independently and hold 5-10 seconds   Total Score 46     Timed Up and Go Test   Normal TUG (seconds) 11.42   TUG Comments L loft strand crutch        POSTURE/OBSERVATION: WFL  PROM/AROM: WFL  STRENGTH:  Graded on a 0-5 scale Muscle Group Left Right  Hip Flex 5/5 4/5  Hip Abd 5/5 4-/5  Hip Add 5/5 4-/5  Hip Ext 4/5 4-/5  Hip IR/ER    Knee Flex 5/5 4-/5  Knee Ext 5/5 4-/5  Ankle DF 5/5 4/5  Ankle PF 5/5 4/5   SENSATION: B UE and LE WFL  FUNCTIONAL MOBILITY: Difficulty walking  BALANCE: Fair standing balance: 46/56 Berg Balance  GAIT: L  lateral lean, decreased trunk rotation, decreased speed  OUTCOME MEASURES: TEST Outcome Interpretation  5 times sit<>stand 12.85 sec >60 yo, >15 sec indicates increased risk for falls  10 meter walk test      0.82           m/s <1.0 m/s  indicates increased risk for falls; limited community ambulator  Timed up and Go         11.42     sec <14 sec indicates increased risk for falls  Berg Balance Assessment 46/56 <36/56 (100% risk for falls), 37-45 (80% risk for falls); 46-51 (>50% risk for falls); 52-55 (lower risk <25% of falls)     Therex:  STS without UE support x10  Cues for proper technique of exercises to target specific muscle and slow eccentric contractions for most effective strengthening.  HEP provided.                    PT Education - 06/06/16 1236    Education provided Yes   Education Details HEP see pt instructions   Person(s) Educated Patient   Methods Explanation;Demonstration;Handout   Comprehension Verbalized understanding;Returned demonstration             PT Long Term Goals - 06/06/16 1250      PT LONG TERM GOAL #1   Title Patient will be independent in home exercise program to improve strength/mobility for better functional independence with ADLs.   Time 4   Period Weeks   Status New     PT LONG TERM GOAL #2   Title Patient will reduce timed up and go to <11 seconds to reduce fall risk and demonstrate improved transfer/gait ability.   Baseline 11.42 sec with L loft strand crutch   Time 4   Period Weeks   Status New     PT LONG TERM GOAL #3   Title Pt will score >51/56 on Berg Balance test for reduces risk of falls within 4 weeks.   Baseline 46/56   Time 4   Period Weeks   Status New     PT LONG TERM GOAL #4   Title --   Time 12   Period --   Status --               Plan - 06/06/16 1239    Clinical Impression Statement Pt presents with difficulty walking, decreased strength and balance deficits following her CVA  in January. She received skilled PT afterwards and had improvement, but now feels that she is having trouble getting around again. Her gait speed is decreased and impaired balance. She will benefit from skilled PT to address functional deficits and improve funcitonal mobility.   Rehab Potential Fair   Clinical Impairments Affecting Rehab Potential hx of CVA   PT Frequency 2x / week   PT Duration 4 weeks   PT Treatment/Interventions DME Instruction;Gait training;Stair training;Therapeutic activities;Therapeutic exercise;Balance training;Neuromuscular re-education;Patient/family education   PT Next Visit Plan balance and LE strength   PT Home Exercise Plan STS   Consulted and Agree with Plan of Care Patient      Patient will benefit from skilled therapeutic intervention in order to improve the following deficits and impairments:  Abnormal gait, Decreased activity tolerance, Decreased strength, Difficulty walking  Visit Diagnosis: Muscle weakness (generalized) - Plan: PT plan of care cert/re-cert  Difficulty walking - Plan: PT plan of care cert/re-cert  Unsteadiness on feet - Plan: PT plan of care cert/re-cert     Problem List Patient Active Problem List   Diagnosis Date Noted  . CVA (cerebral infarction) 11/07/2015    Liseth Wann Shiela Mayer, PT, DPT  06/06/16, 1:06 PM [place] [facility] [address] [place], [place], [address] Phone: [phone]   Fax:  [phone]  Name: Autumn Arnold MRN: [id number] Date of Birth: [birth year]

## 2016-06-07 ENCOUNTER — Ambulatory Visit
Admission: RE | Admit: 2016-06-07 | Discharge: 2016-06-07 | Disposition: A | Discharge status: Home / Self Care | Payer: BLUE CROSS/BLUE SHIELD | Source: Ambulatory Visit | Attending: Orthopedic Surgery | Admitting: Orthopedic Surgery

## 2016-06-07 DIAGNOSIS — M25571 Pain in right ankle and joints of right foot: Secondary | ICD-10-CM | POA: Diagnosis present

## 2016-06-07 DIAGNOSIS — G8929 Other chronic pain: Secondary | ICD-10-CM | POA: Insufficient documentation

## 2016-06-07 DIAGNOSIS — M899 Disorder of bone, unspecified: Secondary | ICD-10-CM | POA: Insufficient documentation

## 2016-06-11 ENCOUNTER — Encounter: Payer: Self-pay | Admitting: Physical Therapy

## 2016-06-11 ENCOUNTER — Ambulatory Visit: Payer: BLUE CROSS/BLUE SHIELD | Admitting: Physical Therapy

## 2016-06-11 DIAGNOSIS — R2681 Unsteadiness on feet: Secondary | ICD-10-CM

## 2016-06-11 DIAGNOSIS — M6281 Muscle weakness (generalized): Secondary | ICD-10-CM | POA: Diagnosis not present

## 2016-06-11 DIAGNOSIS — R262 Difficulty in walking, not elsewhere classified: Secondary | ICD-10-CM

## 2016-06-11 NOTE — Therapy (Signed)
[place] MAIN [facility] SERVICES [address] [place], [place], [address] Phone: [phone]   Fax:  [phone]  Physical Therapy Treatment  Patient Details  Name: Autumn Arnold MRN: [id number] Date of Birth: 1951-01-13 Referring Provider: Dr. Netty Starring  Encounter Date: 06/11/2016      PT End of Session - 06/11/16 0958    Visit Number 2   Number of Visits 9   Date for PT Re-Evaluation 2016-07-29   Authorization Type g codes   PT Start Time 0945   PT Stop Time 1030   PT Time Calculation (min) 45 min   Equipment Utilized During Treatment Gait belt   Activity Tolerance Patient tolerated treatment well   Behavior During Therapy [facility] for tasks assessed/performed      Past Medical History:  Diagnosis Date  . Asthma   . Cough    CHRONIC  . Diabetes mellitus 2008  . Edema    LEGS/FEET  . GERD (gastroesophageal reflux disease)   . Hyperlipidemia   . Hypertension   . Shortness of breath dyspnea    DOE  . Sleep apnea    CPAP at [facility], Dr. Humphrey Rolls  . Stroke [facility]) 02/2010   headache, left arm numbness  . Wheezing     Past Surgical History:  Procedure Laterality Date  . ABDOMINAL HYSTERECTOMY    . ANTERIOR FUSION CERVICAL SPINE    . CARPAL TUNNEL RELEASE     Bilateral  . CATARACT EXTRACTION W/PHACO Left 03/12/2016   Procedure: CATARACT EXTRACTION PHACO AND INTRAOCULAR LENS PLACEMENT (IOC);  Surgeon: Estill Cotta, MD;  Location: [facility] ORS;  Service: Ophthalmology;  Laterality: Left;  [place] 01:12 AP% 23.4 CDE 29.83 fluid pack lot # [id number]  . CATARACT EXTRACTION W/PHACO Right 04/16/2016   Procedure: CATARACT EXTRACTION PHACO AND INTRAOCULAR LENS PLACEMENT (IOC);  Surgeon: Estill Cotta, MD;  Location: [facility] ORS;  Service: Ophthalmology;  Laterality: Right;  [place] 01:17 AP% 22.5 CDE 35.08 Fluid pack lot # [id number]  . CESAREAN SECTION    . ELBOW SURGERY     Tendonitis  . INCONTINENCE SURGERY    . TONSILLECTOMY      There were no  vitals filed for this visit.      Subjective Assessment - 06/11/16 0953    Subjective Pt has a history of CVA on November 06, 2015. She c/o continued difficulty with walking and balance. She has had several falls indoors and one was outdoors.    Patient is accompained by: Family member   Pertinent History Pt has had physical therapy after her CVA with R sided weakness in January and improved, but feels she still needs some improvement with her walking and balance.   Limitations Standing;Walking   How long can you stand comfortably? 1 to 2 hours   How long can you walk comfortably? limited community distances   Patient Stated Goals Patient wants to be able to walk better.    Pain Onset In the past 7 days   Pain Onset In the past 7 days     Therapeutic exercise:  Leg press 90 lbs x 20 x 3 sets Leg press with heel raises with 90 lbs x 20 x 3 sets  Neuromuscular training:  Matrix  Trunk fwd walking/bwd walking/ side stepping left and right with 12.5 lbs x  5 each way. 1/2 foam feet side by side and balancing.    Patient has left hip pain that gets worse during walking backwards and side stepping and  left foot pain during leg press exercise from over compensating due to RLE weakness. RLE continues to have uncontrolled movement and difficulty with motor control.                             PT Education - 06/11/16 0954    Education provided Yes   Education Details safety with ambulation   Person(s) Educated Patient   Methods Explanation   Comprehension Verbalized understanding             PT Long Term Goals - 06/06/16 1250      PT LONG TERM GOAL #1   Title Patient will be independent in home exercise program to improve strength/mobility for better functional independence with ADLs.   Time 4   Period Weeks   Status New     PT LONG TERM GOAL #2   Title Patient will reduce timed up and go to <11 seconds to reduce fall risk and demonstrate improved  transfer/gait ability.   Baseline 11.42 sec with L loft strand crutch   Time 4   Period Weeks   Status New     PT LONG TERM GOAL #3   Title Pt will score >51/56 on Berg Balance test for reduces risk of falls within 4 weeks.   Baseline 46/56   Time 4   Period Weeks   Status New     PT LONG TERM GOAL #4   Title --   Time 12   Period --   Status --               Plan - 06/11/16 [id number]    Clinical Impression Statement Patient presents with weakness in RLE and decreased dynamic standing balance. She is ambulating with loftstrand crutch. She has been having intermittent pain in let hip, right knee and right foot, and swelling in left foot., and recently in her sacrum.    Rehab Potential Fair   Clinical Impairments Affecting Rehab Potential hx of CVA   PT Frequency 2x / week   PT Duration 4 weeks   PT Treatment/Interventions DME Instruction;Gait training;Stair training;Therapeutic activities;Therapeutic exercise;Balance training;Neuromuscular re-education;Patient/family education   PT Next Visit Plan balance and LE strength   PT Home Exercise Plan STS   Consulted and Agree with Plan of Care Patient      Patient will benefit from skilled therapeutic intervention in order to improve the following deficits and impairments:  Decreased activity tolerance, Decreased strength, Difficulty walking  Visit Diagnosis: Muscle weakness (generalized)  Unsteadiness on feet  Difficulty in walking, not elsewhere classified     Problem List Patient Active Problem List   Diagnosis Date Noted  . CVA (cerebral infarction) 11/07/2015  Alanson Puls, PT, DPT Arelia Sneddon S 06/11/2016, 10:15 AM  [place] MAIN [facility] SERVICES [address] [place], [place], [address] Phone: [phone]   Fax:  [phone]  Name: Autumn Arnold MRN: [id number] Date of Birth: 10-25-1951

## 2016-06-13 ENCOUNTER — Ambulatory Visit: Payer: BLUE CROSS/BLUE SHIELD | Admitting: Physical Therapy

## 2016-06-19 ENCOUNTER — Encounter: Payer: Self-pay | Admitting: Physical Therapy

## 2016-06-19 ENCOUNTER — Ambulatory Visit: Payer: BLUE CROSS/BLUE SHIELD | Admitting: Physical Therapy

## 2016-06-19 DIAGNOSIS — M6281 Muscle weakness (generalized): Secondary | ICD-10-CM

## 2016-06-19 DIAGNOSIS — R262 Difficulty in walking, not elsewhere classified: Secondary | ICD-10-CM

## 2016-06-19 DIAGNOSIS — R2681 Unsteadiness on feet: Secondary | ICD-10-CM

## 2016-06-19 NOTE — Therapy (Signed)
[place] MA[place] [facility] SERVICES [address] [place], [place], [address] Phone: [phone]   Fax:  [phone]  Physical Therapy Treatment  Patient Details  Name: Autumn Arnold MRN: [id number] Date of Birth: Apr 03, 1951 Referring Provider: Dr. Netty Starring  Encounter Date: 06/19/2016      PT End of Session - 06/19/16 1101    Visit Number 3   Number of Visits 9   Date for PT Re-Evaluation Jul 16, 2016   Authorization Type g codes 3   PT Start Time [id number]   PT Stop Time 1130   PT Time Calculation (min) 45 min   Equipment Utilized During Treatment Gait belt   Activity Tolerance Patient tolerated treatment well   Behavior During Therapy [facility] for tasks assessed/performed      Past Medical History:  Diagnosis Date  . Asthma   . Cough    CHRONIC  . Diabetes mellitus 2008  . Edema    LEGS/FEET  . GERD (gastroesophageal reflux disease)   . Hyperlipidemia   . Hypertension   . Shortness of breath dyspnea    DOE  . Sleep apnea    CPAP at [facility], Dr. Humphrey Rolls  . Stroke [facility]) 02/2010   headache, left arm numbness  . Wheezing     Past Surgical History:  Procedure Laterality Date  . ABDOMINAL HYSTERECTOMY    . ANTERIOR FUSION CERVICAL SPINE    . CARPAL TUNNEL RELEASE     Bilateral  . CATARACT EXTRACTION W/PHACO Left 03/12/2016   Procedure: CATARACT EXTRACTION PHACO AND INTRAOCULAR LENS PLACEMENT (IOC);  Surgeon: Estill Cotta, MD;  Location: [facility] ORS;  Service: Ophthalmology;  Laterality: Left;  [place] 01:12 AP% 23.4 CDE 29.83 fluid pack lot # [id number]  . CATARACT EXTRACTION W/PHACO Right 04/16/2016   Procedure: CATARACT EXTRACTION PHACO AND INTRAOCULAR LENS PLACEMENT (IOC);  Surgeon: Estill Cotta, MD;  Location: [facility] ORS;  Service: Ophthalmology;  Laterality: Right;  [place] 01:17 AP% 22.5 CDE 35.08 Fluid pack lot # [id number]  . CESAREAN SECTION    . ELBOW SURGERY     Tendonitis  . INCONTINENCE SURGERY    . TONSILLECTOMY      There were no  vitals filed for this visit.      Subjective Assessment - 06/19/16 1052    Subjective Patient is doing well, she hit her right hand on something and has a sore hand today.   Currently in Pain? Yes   Pain Score 6    Pain Location Knee   Pain Orientation Right   Pain Descriptors / Indicators Aching   Pain Type Acute pain   Pain Onset More than a month ago   Pain Frequency Intermittent   Multiple Pain Sites --  hand is 10/10         THER-EX Standing exercises with RTB BLE : Marching 2 x 10; SLR 2 x 10; Abduction 2 x 10; Extension 2 x 10; Eccentric step downs x 3 BLE (stopped due to hand and knee pain) Squats x 10 with 5 sec hold Heel raises x 10 x 2  Resisted side-steeping RTB 4 lengths x 2; Standing mini squats 2 x 10 with RTB around knees to encourage abduction; Sit to stand without UE support 2 x 10; Step-ups to 6" step x 10 bilateral; Leg press x 20 x 3 90 lbs,  heel raises with 90 lbs x 20 x 3 Patient needs occasional verbal cueing to improve posture and cueing to correctly perform exercises slowly, holding at  end of range to increase motor firing of desired muscle to encourage fatigue.                           PT Education - 06/19/16 1101    Education provided Yes   Education Details heel toe gait   Person(s) Educated Patient   Methods Explanation   Comprehension Verbalized understanding             PT Long Term Goals - 06/06/16 1250      PT LONG TERM GOAL #1   Title Patient will be independent in home exercise program to improve strength/mobility for better functional independence with ADLs.   Time 4   Period Weeks   Status New     PT LONG TERM GOAL #2   Title Patient will reduce timed up and go to <11 seconds to reduce fall risk and demonstrate improved transfer/gait ability.   Baseline 11.42 sec with L loft strand crutch   Time 4   Period Weeks   Status New     PT LONG TERM GOAL #3   Title Pt will score >51/56 on Berg  Balance test for reduces risk of falls within 4 weeks.   Baseline 46/56   Time 4   Period Weeks   Status New     PT LONG TERM GOAL #4   Title --   Time 12   Period --   Status --               Plan - 06/19/16 1101    Clinical Impression Statement Fatigue with sit to stand but demonstrating more control, Increase weight for standing exercises. Fatigue still evident with cross trainer and endurance.    Rehab Potential Fair   Clinical Impairments Affecting Rehab Potential hx of CVA   PT Frequency 2x / week   PT Duration 4 weeks   PT Treatment/Interventions DME Instruction;Gait training;Stair training;Therapeutic activities;Therapeutic exercise;Balance training;Neuromuscular re-education;Patient/family education   PT Next Visit Plan balance and LE strength   PT Home Exercise Plan STS   Consulted and Agree with Plan of Care Patient      Patient will benefit from skilled therapeutic intervention in order to improve the following deficits and impairments:  Decreased activity tolerance, Decreased strength, Difficulty walking  Visit Diagnosis: Muscle weakness (generalized)  Unsteadiness on feet  Difficulty in walking, not elsewhere classified     Problem List Patient Active Problem List   Diagnosis Date Noted  . CVA (cerebral infarction) 11/07/2015   Alanson Puls, PT, DPT [place] S 06/19/2016, 11:03 AM  [place] MAIN [facility] SERVICES [address] [place], [place], [address] Phone: [phone]   Fax:  [phone]  Name: Autumn Arnold MRN: [id number] Date of Birth: 1951/11/04

## 2016-06-21 ENCOUNTER — Ambulatory Visit: Payer: BLUE CROSS/BLUE SHIELD | Admitting: Physical Therapy

## 2016-06-25 ENCOUNTER — Ambulatory Visit: Payer: BLUE CROSS/BLUE SHIELD | Admitting: Physical Therapy

## 2016-06-27 ENCOUNTER — Ambulatory Visit: Payer: BLUE CROSS/BLUE SHIELD | Admitting: Physical Therapy

## 2016-07-02 ENCOUNTER — Ambulatory Visit: Payer: BLUE CROSS/BLUE SHIELD | Admitting: Physical Therapy

## 2016-07-04 ENCOUNTER — Ambulatory Visit: Payer: BLUE CROSS/BLUE SHIELD | Admitting: Physical Therapy

## 2016-07-11 ENCOUNTER — Ambulatory Visit: Payer: Self-pay | Admitting: Physical Therapy

## 2016-07-16 ENCOUNTER — Ambulatory Visit: Payer: Self-pay | Admitting: Physical Therapy

## 2016-07-18 ENCOUNTER — Ambulatory Visit: Payer: Self-pay | Admitting: Physical Therapy

## 2016-07-23 ENCOUNTER — Ambulatory Visit: Payer: Self-pay | Admitting: Physical Therapy

## 2016-07-25 ENCOUNTER — Ambulatory Visit: Payer: Self-pay | Admitting: Physical Therapy

## 2016-07-30 ENCOUNTER — Ambulatory Visit: Payer: Self-pay | Admitting: Physical Therapy

## 2016-08-01 ENCOUNTER — Ambulatory Visit: Payer: Self-pay | Admitting: Physical Therapy

## 2017-06-18 ENCOUNTER — Other Ambulatory Visit: Payer: Self-pay | Admitting: Family Medicine

## 2017-06-18 DIAGNOSIS — R1310 Dysphagia, unspecified: Secondary | ICD-10-CM

## 2017-07-05 ENCOUNTER — Ambulatory Visit: Payer: BLUE CROSS/BLUE SHIELD | Attending: Family Medicine

## 2017-10-18 DIAGNOSIS — F17200 Nicotine dependence, unspecified, uncomplicated: Secondary | ICD-10-CM | POA: Diagnosis present

## 2018-02-21 ENCOUNTER — Other Ambulatory Visit: Payer: Self-pay | Admitting: Family Medicine

## 2018-02-21 DIAGNOSIS — R1319 Other dysphagia: Secondary | ICD-10-CM

## 2018-03-17 ENCOUNTER — Ambulatory Visit: Payer: Self-pay

## 2018-03-20 ENCOUNTER — Ambulatory Visit
Admission: RE | Admit: 2018-03-20 | Discharge: 2018-03-20 | Disposition: A | Discharge status: Home / Self Care | Payer: Medicare Other | Source: Ambulatory Visit | Attending: Family Medicine | Admitting: Family Medicine

## 2018-03-20 DIAGNOSIS — R1312 Dysphagia, oropharyngeal phase: Secondary | ICD-10-CM | POA: Diagnosis not present

## 2018-03-20 DIAGNOSIS — R1310 Dysphagia, unspecified: Secondary | ICD-10-CM | POA: Diagnosis present

## 2018-03-20 NOTE — Therapy (Signed)
[place], [place], [address] Phone: [phone]   Fax:     Modified Barium Swallow  Patient Details  Name: Autumn Arnold MRN: [id number] Date of Birth: Mar 07, 1951 No data recorded  Encounter Date: 03/20/2018  End of Session - 03/20/18 1454    Visit Number  1    Number of Visits  1    Date for SLP Re-Evaluation  03/20/18    SLP Start Time  16    SLP Stop Time   7106    SLP Time Calculation (min)  55 min    Activity Tolerance  Patient tolerated treatment well       Past Medical History:  Diagnosis Date  . Asthma   . Cough    CHRONIC  . Diabetes mellitus 2008  . Edema    LEGS/FEET  . GERD (gastroesophageal reflux disease)   . Hyperlipidemia   . Hypertension   . Shortness of breath dyspnea    DOE  . Sleep apnea    CPAP at [facility], Dr. Humphrey Rolls  . Stroke [facility]) 02/2010   headache, left arm numbness  . Wheezing     Past Surgical History:  Procedure Laterality Date  . ABDOMINAL HYSTERECTOMY    . ANTERIOR FUSION CERVICAL SPINE    . CARPAL TUNNEL RELEASE     Bilateral  . CATARACT EXTRACTION W/PHACO Left 03/12/2016   Procedure: CATARACT EXTRACTION PHACO AND INTRAOCULAR LENS PLACEMENT (IOC);  Surgeon: Estill Cotta, MD;  Location: [facility] ORS;  Service: Ophthalmology;  Laterality: Left;  [place] 01:12 AP% 23.4 CDE 29.83 fluid pack lot # [id number]  . CATARACT EXTRACTION W/PHACO Right 04/16/2016   Procedure: CATARACT EXTRACTION PHACO AND INTRAOCULAR LENS PLACEMENT (IOC);  Surgeon: Estill Cotta, MD;  Location: [facility] ORS;  Service: Ophthalmology;  Laterality: Right;  [place] 01:17 AP% 22.5 CDE 35.08 Fluid pack lot # [id number]  . CESAREAN SECTION    . ELBOW SURGERY     Tendonitis  . INCONTINENCE SURGERY    . TONSILLECTOMY      There were no vitals filed for this visit.   Subjective: Patient behavior: (alertness, ability to follow instructions, etc.): The patient is able to verbalize her swallowing  complaints and follow directions.  She is reporting voice quality changes "This is not really my voice"  Chief complaint: difficulty swallowing solids and liquids   Objective:  Radiological Procedure: A videoflouroscopic evaluation of oral-preparatory, reflex initiation, and pharyngeal phases of the swallow was performed; as well as a screening of the upper esophageal phase.  I. POSTURE: Upright in MBS chair  II. VIEW: Lateral  III. COMPENSATORY STRATEGIES: N/A  IV. BOLUSES ADMINISTERED:    Thin Liquid: 1 small, 3 rapid consecutive   Nectar-thick Liquid: 1 moderate   Honey-thick Liquid: DNT   Puree: 2 teaspoon presentations   Mechanical Soft: 1/4 graham cracker in applesauce  V. RESULTS OF EVALUATION: A. ORAL PREPARATORY PHASE: (The lips, tongue, and velum are observed for strength and coordination)       **Overall Severity Rating: Within normal limits  B. SWALLOW INITIATION/REFLEX: (The reflex is normal if "triggered" by the time the bolus reached the base of the tongue)  **Overall Severity Rating: Mild; triggers while falling from the valleculae to the pyriform sinuses  C. PHARYNGEAL PHASE: (Pharyngeal function is normal if the bolus shows rapid, smooth, and continuous transit through the pharynx and there is no pharyngeal residue after the swallow)  **Overall Severity Rating: Within normal  limits  D. LARYNGEAL PENETRATION: (Material entering into the laryngeal inlet/vestibule but not aspirated)  Transient with liquids   E. ASPIRATION: None  F. ESOPHAGEAL PHASE: (Screening of the upper esophagus): No observed abnormality within the viewable cervical esophagus  ASSESSMENT: This 67 year old woman; with complaint of difficulty swallowing solids and liquids; is presenting with minimal oropharyngeal dysphagia characterized by delayed pharyngeal swallow initiation and transient laryngeal penetration.  Oral control of the bolus including oral hold, rotary mastication, and anterior  to posterior transfer is within normal limits.  Aspects of the pharyngeal stage of swallowing including tongue base retraction, hyolaryngeal excursion, epiglottic inversion, and duration/amplitude of UES opening are within normal limits.  There is no significant pharyngeal residue or tracheal aspiration.  The patient's complaints do not appear to be due to oropharyngeal swallow function.  The patient is complaining of vocal quality change and may benefit from referral to ENT.  PLAN/RECOMMENDATIONS:   A. Diet: Regular   B. Swallowing Precautions: No special recommendations/precautions   C. Recommended consultation to: ENT   D. Therapy recommendations: speech therapy is not indicated   E. Results and recommendations were discussed with the patient immediately following the study and the final report routed to the referring MD   Oropharyngeal dysphagia - Plan: DG OP Swallowing Func-Medicare/Speech Path, DG OP Swallowing Func-Medicare/Speech Path        Problem List Patient Active Problem List   Diagnosis Date Noted  . CVA (cerebral infarction) 11/07/2015   Leroy Sea, MS/CCC- SLP  Lou Miner 03/20/2018, 2:55 PM  [place] [place], [place], [address] Phone: [phone]   Fax:     Name: Autumn Arnold MRN: [id number] Date of Birth: Oct 05, 1951

## 2018-04-28 ENCOUNTER — Other Ambulatory Visit: Payer: Self-pay | Admitting: Specialist

## 2018-04-28 DIAGNOSIS — R0602 Shortness of breath: Secondary | ICD-10-CM

## 2018-05-14 ENCOUNTER — Ambulatory Visit
Admission: RE | Admit: 2018-05-14 | Discharge: 2018-05-14 | Disposition: A | Discharge status: Home / Self Care | Payer: Medicare Other | Source: Ambulatory Visit | Attending: Specialist | Admitting: Specialist

## 2018-05-14 DIAGNOSIS — R0602 Shortness of breath: Secondary | ICD-10-CM | POA: Diagnosis present

## 2018-05-14 DIAGNOSIS — I2510 Atherosclerotic heart disease of native coronary artery without angina pectoris: Secondary | ICD-10-CM | POA: Insufficient documentation

## 2018-05-14 DIAGNOSIS — J439 Emphysema, unspecified: Secondary | ICD-10-CM | POA: Insufficient documentation

## 2018-05-14 DIAGNOSIS — I700 Atherosclerosis of aorta: Secondary | ICD-10-CM | POA: Diagnosis not present

## 2018-06-23 DIAGNOSIS — Z66 Do not resuscitate: Secondary | ICD-10-CM | POA: Insufficient documentation

## 2018-06-23 DIAGNOSIS — G8929 Other chronic pain: Secondary | ICD-10-CM | POA: Insufficient documentation

## 2018-06-23 DIAGNOSIS — Z0000 Encounter for general adult medical examination without abnormal findings: Secondary | ICD-10-CM | POA: Insufficient documentation

## 2018-06-26 ENCOUNTER — Other Ambulatory Visit: Payer: Self-pay | Admitting: Specialist

## 2018-06-26 DIAGNOSIS — J849 Interstitial pulmonary disease, unspecified: Secondary | ICD-10-CM

## 2018-06-27 DIAGNOSIS — M85852 Other specified disorders of bone density and structure, left thigh: Secondary | ICD-10-CM | POA: Insufficient documentation

## 2018-07-01 DIAGNOSIS — E559 Vitamin D deficiency, unspecified: Secondary | ICD-10-CM | POA: Insufficient documentation

## 2018-08-11 ENCOUNTER — Encounter: Payer: Self-pay | Admitting: *Deleted

## 2018-08-12 ENCOUNTER — Encounter
Admission: RE | Disposition: A | Discharge status: Home / Self Care | Payer: Self-pay | Source: Ambulatory Visit | Attending: Internal Medicine

## 2018-08-12 ENCOUNTER — Ambulatory Visit: Payer: Medicare Other | Admitting: Anesthesiology

## 2018-08-12 ENCOUNTER — Ambulatory Visit
Admission: RE | Admit: 2018-08-12 | Discharge: 2018-08-12 | Disposition: A | Discharge status: Home / Self Care | Payer: Medicare Other | Source: Ambulatory Visit | Attending: Internal Medicine | Admitting: Internal Medicine

## 2018-08-12 ENCOUNTER — Encounter: Payer: Self-pay | Admitting: Anesthesiology

## 2018-08-12 DIAGNOSIS — Z9842 Cataract extraction status, left eye: Secondary | ICD-10-CM | POA: Insufficient documentation

## 2018-08-12 DIAGNOSIS — M1990 Unspecified osteoarthritis, unspecified site: Secondary | ICD-10-CM | POA: Insufficient documentation

## 2018-08-12 DIAGNOSIS — R0600 Dyspnea, unspecified: Secondary | ICD-10-CM | POA: Diagnosis not present

## 2018-08-12 DIAGNOSIS — G4730 Sleep apnea, unspecified: Secondary | ICD-10-CM | POA: Insufficient documentation

## 2018-08-12 DIAGNOSIS — J449 Chronic obstructive pulmonary disease, unspecified: Secondary | ICD-10-CM | POA: Diagnosis not present

## 2018-08-12 DIAGNOSIS — K648 Other hemorrhoids: Secondary | ICD-10-CM | POA: Diagnosis not present

## 2018-08-12 DIAGNOSIS — Z8673 Personal history of transient ischemic attack (TIA), and cerebral infarction without residual deficits: Secondary | ICD-10-CM | POA: Diagnosis not present

## 2018-08-12 DIAGNOSIS — E785 Hyperlipidemia, unspecified: Secondary | ICD-10-CM | POA: Insufficient documentation

## 2018-08-12 DIAGNOSIS — K210 Gastro-esophageal reflux disease with esophagitis: Secondary | ICD-10-CM | POA: Insufficient documentation

## 2018-08-12 DIAGNOSIS — Z9841 Cataract extraction status, right eye: Secondary | ICD-10-CM | POA: Insufficient documentation

## 2018-08-12 DIAGNOSIS — E119 Type 2 diabetes mellitus without complications: Secondary | ICD-10-CM | POA: Insufficient documentation

## 2018-08-12 DIAGNOSIS — K2970 Gastritis, unspecified, without bleeding: Secondary | ICD-10-CM | POA: Diagnosis not present

## 2018-08-12 DIAGNOSIS — J45909 Unspecified asthma, uncomplicated: Secondary | ICD-10-CM | POA: Diagnosis not present

## 2018-08-12 DIAGNOSIS — F419 Anxiety disorder, unspecified: Secondary | ICD-10-CM | POA: Insufficient documentation

## 2018-08-12 DIAGNOSIS — R1310 Dysphagia, unspecified: Secondary | ICD-10-CM | POA: Diagnosis not present

## 2018-08-12 DIAGNOSIS — Z1211 Encounter for screening for malignant neoplasm of colon: Secondary | ICD-10-CM | POA: Diagnosis not present

## 2018-08-12 DIAGNOSIS — Z7984 Long term (current) use of oral hypoglycemic drugs: Secondary | ICD-10-CM | POA: Diagnosis not present

## 2018-08-12 DIAGNOSIS — Z888 Allergy status to other drugs, medicaments and biological substances status: Secondary | ICD-10-CM | POA: Diagnosis not present

## 2018-08-12 DIAGNOSIS — Z79899 Other long term (current) drug therapy: Secondary | ICD-10-CM | POA: Insufficient documentation

## 2018-08-12 DIAGNOSIS — Z7951 Long term (current) use of inhaled steroids: Secondary | ICD-10-CM | POA: Diagnosis not present

## 2018-08-12 DIAGNOSIS — I10 Essential (primary) hypertension: Secondary | ICD-10-CM | POA: Diagnosis not present

## 2018-08-12 DIAGNOSIS — K224 Dyskinesia of esophagus: Secondary | ICD-10-CM | POA: Diagnosis not present

## 2018-08-12 DIAGNOSIS — L83 Acanthosis nigricans: Secondary | ICD-10-CM | POA: Diagnosis not present

## 2018-08-12 DIAGNOSIS — Z7982 Long term (current) use of aspirin: Secondary | ICD-10-CM | POA: Diagnosis not present

## 2018-08-12 HISTORY — PX: ESOPHAGOGASTRODUODENOSCOPY (EGD) WITH PROPOFOL: SHX5813

## 2018-08-12 HISTORY — DX: Chronic obstructive pulmonary disease, unspecified: J44.9

## 2018-08-12 HISTORY — DX: Unspecified osteoarthritis, unspecified site: M19.90

## 2018-08-12 HISTORY — DX: Localized edema: R60.0

## 2018-08-12 HISTORY — PX: COLONOSCOPY WITH PROPOFOL: SHX5780

## 2018-08-12 HISTORY — DX: Anxiety disorder, unspecified: F41.9

## 2018-08-12 LAB — GLUCOSE, CAPILLARY: Glucose-Capillary: 132 mg/dL — ABNORMAL HIGH (ref 70–99)

## 2018-08-12 SURGERY — COLONOSCOPY WITH PROPOFOL
Anesthesia: General

## 2018-08-12 MED ORDER — LIDOCAINE 2% (20 MG/ML) 5 ML SYRINGE
INTRAMUSCULAR | Status: DC | PRN
  Administered 2018-08-12: 30 mg via INTRAVENOUS

## 2018-08-12 MED ORDER — FENTANYL CITRATE (PF) 100 MCG/2ML IJ SOLN
INTRAMUSCULAR | Status: DC | PRN
  Administered 2018-08-12 (×2): 50 ug via INTRAVENOUS

## 2018-08-12 MED ORDER — SODIUM CHLORIDE 0.9 % IV SOLN
INTRAVENOUS | Status: DC | PRN
  Administered 2018-08-12: 08:00:00 via INTRAVENOUS

## 2018-08-12 MED ORDER — PROPOFOL 10 MG/ML IV BOLUS
INTRAVENOUS | Status: DC | PRN
  Administered 2018-08-12: 100 mg via INTRAVENOUS

## 2018-08-12 MED ORDER — PHENYLEPHRINE HCL 10 MG/ML IJ SOLN
INTRAMUSCULAR | Status: DC | PRN
  Administered 2018-08-12: 100 ug via INTRAVENOUS

## 2018-08-12 MED ORDER — LIDOCAINE HCL (PF) 2 % IJ SOLN
INTRAMUSCULAR | Status: AC
  Filled 2018-08-12: qty 10

## 2018-08-12 MED ORDER — SODIUM CHLORIDE 0.9 % IV SOLN
INTRAVENOUS | Status: DC
  Administered 2018-08-12: 1000 mL via INTRAVENOUS

## 2018-08-12 MED ORDER — PHENYLEPHRINE HCL 10 MG/ML IJ SOLN
INTRAMUSCULAR | Status: AC
  Filled 2018-08-12: qty 1

## 2018-08-12 MED ORDER — EPHEDRINE SULFATE 50 MG/ML IJ SOLN
INTRAMUSCULAR | Status: AC
  Filled 2018-08-12: qty 1

## 2018-08-12 MED ORDER — PROPOFOL 10 MG/ML IV BOLUS
INTRAVENOUS | Status: AC
  Filled 2018-08-12: qty 20

## 2018-08-12 MED ORDER — PROPOFOL 500 MG/50ML IV EMUL
INTRAVENOUS | Status: DC | PRN
  Administered 2018-08-12: 180 ug/kg/min via INTRAVENOUS

## 2018-08-12 MED ORDER — GLYCOPYRROLATE 0.2 MG/ML IJ SOLN
INTRAMUSCULAR | Status: AC
  Filled 2018-08-12: qty 1

## 2018-08-12 MED ORDER — PROPOFOL 500 MG/50ML IV EMUL
INTRAVENOUS | Status: AC
  Filled 2018-08-12: qty 50

## 2018-08-12 MED ORDER — FENTANYL CITRATE (PF) 100 MCG/2ML IJ SOLN
INTRAMUSCULAR | Status: AC
  Filled 2018-08-12: qty 2

## 2018-08-12 NOTE — Op Note (Signed)
[facility] Gastroenterology Patient Name: Autumn Arnold Procedure Date: 08/12/2018 8:56 AM MRN: [id number] Account #: [id number] Date of Birth: 28-Oct-1951 Admit Type: Outpatient Age: 67 Room: [facility] ENDO ROOM 3 Gender: Female Note Status: Finalized Procedure:            Upper GI endoscopy Indications:          Dysphagia, Esophageal reflux Providers:            Benay Pike. Alice Reichert MD, MD Referring MD:         [name] (Referring MD) Medicines:            Propofol per Anesthesia Complications:        No immediate complications. Procedure:            Pre-Anesthesia Assessment:                       - The risks and benefits of the procedure and the                        sedation options and risks were discussed with the                        patient. All questions were answered and informed                        consent was obtained.                       - Patient identification and proposed procedure were                        verified prior to the procedure by the nurse. The                        procedure was verified in the procedure room.                       - ASA Grade Assessment: II - A patient with mild                        systemic disease.                       - After reviewing the risks and benefits, the patient                        was deemed in satisfactory condition to undergo the                        procedure.                       After obtaining informed consent, the endoscope was                        passed under direct vision. Throughout the procedure,                        the patient's blood pressure, pulse, and oxygen  saturations were monitored continuously. The Endoscope                        was introduced through the mouth, and advanced to the                        third part of duodenum. The upper GI endoscopy was                        accomplished without difficulty. The patient tolerated                       the procedure well. Findings:      Diffuse mild mucosal changes characterized by altered texture and a       decreased vascular pattern were found in the entire esophagus. Biopsies       were obtained from the proximal and distal esophagus with cold forceps       for histology of suspected eosinophilic esophagitis.      The Z-line was irregular and was found at the gastroesophageal junction.       Mucosa was biopsied with a cold forceps for histology. One specimen       bottle was sent to pathology.      Abnormal motility was noted at the lower esophageal sphincter. The       cricopharyngeus was abnormal. There is spasticity of the esophageal       body. The distal esophagus/lower esophageal sphincter is spastic, but       gives up passage to the endoscope. The scope was withdrawn. Dilation was       performed with a Maloney dilator with no resistance at 11 Fr.      Diffuse mild inflammation characterized by congestion (edema) and       erythema was found in the entire examined stomach.      The examined duodenum was normal.      The exam was otherwise without abnormality. Impression:           - Texture changed, decreased vascular pattern mucosa in                        the esophagus. Biopsied.                       - Z-line irregular, at the gastroesophageal junction.                        Biopsied.                       - Abnormal esophageal motility. Dilated.                       - Gastritis.                       - Normal examined duodenum.                       - The examination was otherwise normal. Recommendation:       - Await pathology results.                       - Monitor results to esophageal dilation                       -  Proceed with colonoscopy Procedure Code(s):    --- Professional ---                       [phone], Esophagogastroduodenoscopy, flexible, transoral;                        with biopsy, single or multiple                        43450, Dilation of esophagus, by unguided sound or                        bougie, single or multiple passes Diagnosis Code(s):    --- Professional ---                       K21.9, Gastro-esophageal reflux disease without                        esophagitis                       R13.10, Dysphagia, unspecified                       K29.70, Gastritis, unspecified, without bleeding                       K22.4, Dyskinesia of esophagus                       K22.8, Other specified diseases of esophagus CPT copyright 2018 American Medical Association. All rights reserved. The codes documented in this report are preliminary and upon coder review may  be revised to meet current compliance requirements. Efrain Sella MD, MD 08/12/2018 9:10:01 AM This report has been signed electronically. Number of Addenda: 0 Note Initiated On: 08/12/2018 8:56 AM      [facility]

## 2018-08-12 NOTE — Anesthesia Postprocedure Evaluation (Signed)
Anesthesia Post Note  Patient: Autumn Arnold  Procedure(s) Performed: COLONOSCOPY WITH PROPOFOL (N/A ) ESOPHAGOGASTRODUODENOSCOPY (EGD) WITH PROPOFOL (N/A )  Patient location during evaluation: Endoscopy Anesthesia Type: General Level of consciousness: awake and alert Pain management: pain level controlled Vital Signs Assessment: post-procedure vital signs reviewed and stable Respiratory status: spontaneous breathing, nonlabored ventilation, respiratory function stable and patient connected to nasal cannula oxygen Cardiovascular status: blood pressure returned to baseline and stable Postop Assessment: no apparent nausea or vomiting Anesthetic complications: no     Last Vitals:  Vitals:   08/12/18 1000 08/12/18 1010  BP: 139/60 (!) 132/104  Pulse: 100 (!) 103  Resp: 19 17  Temp:    SpO2: 99% 95%    Last Pain:  Vitals:   08/12/18 0930  TempSrc: Tympanic  PainSc:                  Autumn Arnold

## 2018-08-12 NOTE — Anesthesia Preprocedure Evaluation (Signed)
Anesthesia Evaluation  Patient identified by MRN, date of birth, ID band Patient awake    Reviewed: Allergy & Precautions, H&P , NPO status , Patient's Chart, lab work & pertinent test results  History of Anesthesia Complications Negative for: history of anesthetic complications  Airway Mallampati: III  TM Distance: <3 FB Neck ROM: limited    Dental  (+) Chipped, Poor Dentition, Missing, Upper Dentures   Pulmonary shortness of breath and with exertion, asthma , COPD, Current Smoker,           Cardiovascular Exercise Tolerance: Good hypertension, (-) angina(-) Past MI and (-) DOE      Neuro/Psych PSYCHIATRIC DISORDERS CVA, Residual Symptoms    GI/Hepatic Neg liver ROS, GERD  Medicated and Controlled,  Endo/Other  diabetes, Type 2  Renal/GU negative Renal ROS  negative genitourinary   Musculoskeletal  (+) Arthritis ,   Abdominal   Peds  Hematology negative hematology ROS (+)   Anesthesia Other Findings Past Medical History: No date: Anxiety No date: Arthritis No date: Asthma No date: Bilateral lower extremity edema No date: COPD (chronic obstructive pulmonary disease) (HCC) No date: Cough     Comment:  CHRONIC 2008: Diabetes mellitus No date: Edema     Comment:  LEGS/FEET No date: GERD (gastroesophageal reflux disease) No date: Hyperlipidemia No date: Hypertension No date: Shortness of breath dyspnea     Comment:  DOE No date: Sleep apnea     Comment:  CPAP at [place], Dr. Humphrey Rolls 02/2010: Stroke [facility])     Comment:  headache, left arm numbness No date: Wheezing  Past Surgical History: No date: ABDOMINAL HYSTERECTOMY No date: ANTERIOR FUSION CERVICAL SPINE No date: CARPAL TUNNEL RELEASE     Comment:  Bilateral 03/12/2016: CATARACT EXTRACTION W/PHACO; Left     Comment:  Procedure: CATARACT EXTRACTION PHACO AND INTRAOCULAR               LENS PLACEMENT (IOC);  Surgeon: Estill Cotta, MD;    Location: [facility] ORS;  Service: Ophthalmology;  Laterality:              Left;  [place] 01:12 AP% 23.4 CDE 29.83 fluid pack lot #               [id number] 04/16/2016: CATARACT EXTRACTION W/PHACO; Right     Comment:  Procedure: CATARACT EXTRACTION PHACO AND INTRAOCULAR               LENS PLACEMENT (IOC);  Surgeon: Estill Cotta, MD;                Location: [facility] ORS;  Service: Ophthalmology;  Laterality:              Right;  [place] 01:17 AP% 22.5 CDE 35.08 Fluid pack lot #               [id number] No date: CESAREAN SECTION No date: ELBOW SURGERY     Comment:  Tendonitis No date: INCONTINENCE SURGERY No date: TONSILLECTOMY  BMI    Body Mass Index:  33.69 kg/m      Reproductive/Obstetrics negative OB ROS                             Anesthesia Physical Anesthesia Plan  ASA: III  Anesthesia Plan: General   Post-op Pain Management:    Induction: Intravenous  PONV Risk Score and Plan: Propofol infusion and TIVA  Airway Management Planned: Natural Airway and Nasal Cannula  Additional Equipment:   Intra-op Plan:   Post-operative Plan:   Informed Consent: I have reviewed the patients History and Physical, chart, labs and discussed the procedure including the risks, benefits and alternatives for the proposed anesthesia with the patient or authorized representative who has indicated his/her understanding and acceptance.   Dental Advisory Given  Plan Discussed with: Anesthesiologist, CRNA and Surgeon  Anesthesia Plan Comments: (Patient consented for risks of anesthesia including but not limited to:  - adverse reactions to medications - risk of intubation if required - damage to teeth, lips or other oral mucosa - sore throat or hoarseness - Damage to heart, brain, lungs or loss of life  Patient voiced understanding.)        Anesthesia Quick Evaluation

## 2018-08-12 NOTE — Transfer of Care (Addendum)
Immediate Anesthesia Transfer of Care Note  Patient: Autumn Arnold  Procedure(s) Performed: COLONOSCOPY WITH PROPOFOL (N/A ) ESOPHAGOGASTRODUODENOSCOPY (EGD) WITH PROPOFOL (N/A )  Patient Location: PACU and Endoscopy Unit  Anesthesia Type:General  Level of Consciousness: awake and patient cooperative  Airway & Oxygen Therapy: Patient Spontanous Breathing and Patient connected to nasal cannula oxygen  Post-op Assessment: Report given to RN and Post -op Vital signs reviewed and stable  Post vital signs: Reviewed and stable  Last Vitals:  Vitals Value Taken Time  BP    Temp    Pulse    Resp    SpO2      Last Pain:  Vitals:   08/12/18 0830  TempSrc: Tympanic  PainSc: 0-No pain         Complications: No apparent anesthesia complications

## 2018-08-12 NOTE — Anesthesia Post-op Follow-up Note (Signed)
Anesthesia QCDR form completed.        

## 2018-08-12 NOTE — H&P (Signed)
Outpatient short stay form Pre-procedure 08/12/2018 8:15 AM Teodoro K. Alice Reichert, M.D.  Primary Physician: Meriel Flavors, M.D.  Reason for visit:  Dysphagia, colon cancer screening, GERD  History of present illness:  Patient with hx of GERD has intermittent solid food dysphagia to the level of the sternal notch. Patient presents for colon cancer screening, with last colonoscopy c. 11/2007 which was reportedly unremarkable. Patient denies change in bowel habits, rectal bleeding, weight loss or abdominal pain.    No current facility-administered medications for this encounter.   Facility-Administered Medications Ordered in Other Encounters:  .  0.9 %  sodium chloride infusion, , , Continuous PRN, Courtney Paris, CRNA  Medications Prior to Admission  Medication Sig Dispense Refill Last Dose  . alendronate (FOSAMAX) 70 MG tablet Take 70 mg by mouth once a week. Take with a full glass of water on an empty stomach.     Marland Kitchen glimepiride (AMARYL) 4 MG tablet Take 4 mg by mouth daily with breakfast.     . albuterol (PROAIR HFA) 108 (90 Base) MCG/ACT inhaler Inhale 2 puffs into the lungs as needed.   04/16/2016 at Unknown time  . amitriptyline (ELAVIL) 150 MG tablet Take by mouth daily. Reported on 12/20/2015   04/09/2016  . aspirin EC 81 MG EC tablet Take 1 tablet (81 mg total) by mouth daily. 30 tablet 2 04/15/2016 at Unknown time  . aspirin-acetaminophen-caffeine (EXCEDRIN MIGRAINE) 250-250-65 MG tablet Take by mouth every 6 (six) hours as needed for headache.   04/15/2016 at 1600  . atorvastatin (LIPITOR) 40 MG tablet Take 1 tablet (40 mg total) by mouth daily at 6 PM. 30 tablet 2 04/15/2016 at Unknown time  . lisinopril (PRINIVIL,ZESTRIL) 2.5 MG tablet Take 2.5 mg by mouth daily.   04/15/2016 at 1000  . metFORMIN (GLUCOPHAGE) 500 MG tablet Take 1 tablet (500 mg total) by mouth 2 (two) times daily with a meal. (Patient taking differently: Take 1,000 mg by mouth 2 (two) times daily with a meal. ) 60 tablet  2 04/13/2016  . metoprolol tartrate (LOPRESSOR) 25 MG tablet Take 1 tablet (25 mg total) by mouth 2 (two) times daily. (Patient taking differently: Take 50 mg by mouth 2 (two) times daily. ) 60 tablet 2 04/15/2016 at 1000  . omeprazole (PRILOSEC) 40 MG capsule Take 40 mg by mouth daily.   Past Week at Unknown time  . sertraline (ZOLOFT) 100 MG tablet Take 1 tablet (100 mg total) by mouth daily. 30 tablet 2 04/14/2016  . vitamin B-12 (CYANOCOBALAMIN) 1000 MCG tablet Take 1,000 mcg by mouth daily. Reported on 12/20/2015   04/14/2016     Allergies  Allergen Reactions  . Lisinopril      Past Medical History:  Diagnosis Date  . Anxiety   . Arthritis   . Asthma   . Bilateral lower extremity edema   . COPD (chronic obstructive pulmonary disease) ([place])   . Cough    CHRONIC  . Diabetes mellitus 2008  . Edema    LEGS/FEET  . GERD (gastroesophageal reflux disease)   . Hyperlipidemia   . Hypertension   . Shortness of breath dyspnea    DOE  . Sleep apnea    CPAP at [facility], Dr. Humphrey Rolls  . Stroke [facility]) 02/2010   headache, left arm numbness  . Wheezing     Review of systems:  Otherwise negative.    Physical Exam  Gen: Alert, oriented. Appears stated age.  HEENT: [place]/AT. PERRLA. Lungs: CTA, no wheezes. CV: RR nl  S1, S2. Abd: soft, benign, no masses. BS+ Ext: No edema. Pulses 2+    Planned procedures: Proceed with EGD and colonoscopy. The patient understands the nature of the planned procedure, indications, risks, alternatives and potential complications including but not limited to bleeding, infection, perforation, damage to internal organs and possible oversedation/side effects from anesthesia. The patient agrees and gives consent to proceed.  Please refer to procedure notes for findings, recommendations and patient disposition/instructions.     Teodoro K. Alice Reichert, M.D. Gastroenterology 08/12/2018  8:15 AM

## 2018-08-12 NOTE — Interval H&P Note (Signed)
History and Physical Interval Note:  08/12/2018 8:32 AM  Autumn Arnold  has presented today for surgery, with the diagnosis of SCREENING DYSPHAGIA  The various methods of treatment have been discussed with the patient and family. After consideration of risks, benefits and other options for treatment, the patient has consented to  Procedure(s): COLONOSCOPY WITH PROPOFOL (N/A) ESOPHAGOGASTRODUODENOSCOPY (EGD) WITH PROPOFOL (N/A) as a surgical intervention .  The patient's history has been reviewed, patient examined, no change in status, stable for surgery.  I have reviewed the patient's chart and labs.  Questions were answered to the patient's satisfaction.     [place], [place]

## 2018-08-12 NOTE — Interval H&P Note (Signed)
History and Physical Interval Note:  08/12/2018 8:17 AM  Autumn Arnold  has presented today for surgery, with the diagnosis of SCREENING DYSPHAGIA  The various methods of treatment have been discussed with the patient and family. After consideration of risks, benefits and other options for treatment, the patient has consented to  Procedure(s): COLONOSCOPY WITH PROPOFOL (N/A) ESOPHAGOGASTRODUODENOSCOPY (EGD) WITH PROPOFOL (N/A) as a surgical intervention .  The patient's history has been reviewed, patient examined, no change in status, stable for surgery.  I have reviewed the patient's chart and labs.  Questions were answered to the patient's satisfaction.     [place], [place]

## 2018-08-12 NOTE — Op Note (Signed)
[facility] Gastroenterology Patient Name: Autumn Arnold Procedure Date: 08/12/2018 8:54 AM MRN: [id number] Account #: [id number] Date of Birth: Jul 27, 1951 Admit Type: Outpatient Age: 67 Room: [facility] ENDO ROOM 3 Gender: Female Note Status: Finalized Procedure:            Colonoscopy Indications:          Screening for colorectal malignant neoplasm Providers:            Benay Pike. Alice Reichert MD, MD Referring MD:         [name] (Referring MD) Medicines:            Propofol per Anesthesia Complications:        No immediate complications. Procedure:            Pre-Anesthesia Assessment:                       - The risks and benefits of the procedure and the                        sedation options and risks were discussed with the                        patient. All questions were answered and informed                        consent was obtained.                       - Patient identification and proposed procedure were                        verified prior to the procedure by the nurse. The                        procedure was verified in the procedure room.                       - ASA Grade Assessment: III - A patient with severe                        systemic disease.                       - After reviewing the risks and benefits, the patient                        was deemed in satisfactory condition to undergo the                        procedure.                       After obtaining informed consent, the colonoscope was                        passed under direct vision. Throughout the procedure,                        the patient's blood pressure, pulse, and oxygen  saturations were monitored continuously. The                        Colonoscope was introduced through the anus and                        advanced to the the cecum, identified by appendiceal                        orifice and ileocecal valve. The colonoscopy was                technically difficult and complex due to restricted                        mobility of the colon and a redundant colon. Successful                        completion of the procedure was aided by applying                        abdominal pressure. The patient tolerated the procedure                        well. The quality of the bowel preparation was adequate                        to identify polyps 6 mm and larger in size. The                        ileocecal valve, appendiceal orifice, and rectum were                        photographed. Findings:      The perianal and digital rectal examinations were normal. Pertinent       negatives include normal sphincter tone.      A moderate amount of semi-liquid stool was found in the sigmoid colon       and in the descending colon, making visualization difficult. Lavage of       the area was performed using 50 - 200 mL of sterile water, resulting in       clearance with fair visualization.      The exam was otherwise without abnormality on direct and retroflexion       views. Impression:           - Stool in the sigmoid colon and in the descending                        colon.                       - The examination was otherwise normal on direct and                        retroflexion views.                       - No specimens collected. Recommendation:       - Patient has a contact number available for  emergencies. The signs and symptoms of potential                        delayed complications were discussed with the patient.                        Return to normal activities tomorrow. Written discharge                        instructions were provided to the patient.                       - Monitor results to esophageal dilation                       - Await pathology results from EGD, also performed                        today.                       - Resume previous diet.                       -  Continue present medications.                       - Repeat colonoscopy in 5 years for screening purposes.                       - Return to physician assistant in 2 months.                       - The findings and recommendations were discussed with                        the patient and their family. Procedure Code(s):    --- Professional ---                       N8295, Colorectal cancer screening; colonoscopy on                        individual not meeting criteria for high risk Diagnosis Code(s):    --- Professional ---                       Z12.11, Encounter for screening for malignant neoplasm                        of colon CPT copyright 2018 [employer]. All rights reserved. The codes documented in this report are preliminary and upon coder review may  be revised to meet current compliance requirements. Efrain Sella MD, MD 08/12/2018 9:41:49 AM This report has been signed electronically. Number of Addenda: 0 Note Initiated On: 08/12/2018 8:54 AM Scope Withdrawal Time: 0 hours 8 minutes 35 seconds  Total Procedure Duration: 0 hours 21 minutes 46 seconds       [facility]

## 2018-08-13 ENCOUNTER — Encounter: Payer: Self-pay | Admitting: Internal Medicine

## 2018-08-14 LAB — SURGICAL PATHOLOGY

## 2018-09-02 ENCOUNTER — Ambulatory Visit
Admission: RE | Admit: 2018-09-02 | Discharge: 2018-09-02 | Disposition: A | Discharge status: Home / Self Care | Payer: Medicare Other | Source: Ambulatory Visit | Attending: Specialist | Admitting: Specialist

## 2018-09-02 DIAGNOSIS — J849 Interstitial pulmonary disease, unspecified: Secondary | ICD-10-CM | POA: Diagnosis not present

## 2018-09-02 DIAGNOSIS — F17200 Nicotine dependence, unspecified, uncomplicated: Secondary | ICD-10-CM | POA: Diagnosis not present

## 2018-09-02 DIAGNOSIS — J439 Emphysema, unspecified: Secondary | ICD-10-CM | POA: Diagnosis not present

## 2018-09-02 DIAGNOSIS — I2510 Atherosclerotic heart disease of native coronary artery without angina pectoris: Secondary | ICD-10-CM | POA: Diagnosis not present

## 2018-09-02 DIAGNOSIS — I700 Atherosclerosis of aorta: Secondary | ICD-10-CM | POA: Diagnosis not present

## 2018-09-02 DIAGNOSIS — R590 Localized enlarged lymph nodes: Secondary | ICD-10-CM | POA: Diagnosis not present

## 2018-11-21 ENCOUNTER — Other Ambulatory Visit: Payer: Self-pay | Admitting: Sports Medicine

## 2018-11-21 DIAGNOSIS — G8929 Other chronic pain: Secondary | ICD-10-CM

## 2018-11-21 DIAGNOSIS — M5441 Lumbago with sciatica, right side: Principal | ICD-10-CM

## 2018-12-03 ENCOUNTER — Ambulatory Visit
Admission: RE | Admit: 2018-12-03 | Discharge: 2018-12-03 | Disposition: A | Discharge status: Home / Self Care | Payer: Medicare Other | Source: Ambulatory Visit | Attending: Sports Medicine | Admitting: Sports Medicine

## 2018-12-03 DIAGNOSIS — M5441 Lumbago with sciatica, right side: Secondary | ICD-10-CM | POA: Insufficient documentation

## 2018-12-03 DIAGNOSIS — G8929 Other chronic pain: Secondary | ICD-10-CM | POA: Insufficient documentation

## 2018-12-15 ENCOUNTER — Other Ambulatory Visit: Payer: Self-pay | Admitting: Gastroenterology

## 2018-12-15 DIAGNOSIS — K224 Dyskinesia of esophagus: Secondary | ICD-10-CM

## 2018-12-15 DIAGNOSIS — K219 Gastro-esophageal reflux disease without esophagitis: Secondary | ICD-10-CM

## 2018-12-15 DIAGNOSIS — R1314 Dysphagia, pharyngoesophageal phase: Secondary | ICD-10-CM

## 2018-12-19 ENCOUNTER — Other Ambulatory Visit: Payer: Self-pay | Admitting: Gastroenterology

## 2018-12-19 ENCOUNTER — Ambulatory Visit
Admission: RE | Admit: 2018-12-19 | Discharge: 2018-12-19 | Disposition: A | Discharge status: Home / Self Care | Payer: Medicare Other | Source: Ambulatory Visit | Attending: Gastroenterology | Admitting: Gastroenterology

## 2018-12-19 DIAGNOSIS — R1314 Dysphagia, pharyngoesophageal phase: Secondary | ICD-10-CM | POA: Diagnosis present

## 2018-12-19 DIAGNOSIS — K224 Dyskinesia of esophagus: Secondary | ICD-10-CM | POA: Insufficient documentation

## 2018-12-19 DIAGNOSIS — K219 Gastro-esophageal reflux disease without esophagitis: Secondary | ICD-10-CM | POA: Insufficient documentation

## 2019-06-02 ENCOUNTER — Other Ambulatory Visit: Payer: Self-pay | Admitting: Physical Medicine and Rehabilitation

## 2019-06-02 DIAGNOSIS — M5412 Radiculopathy, cervical region: Secondary | ICD-10-CM

## 2019-06-15 ENCOUNTER — Ambulatory Visit
Admission: RE | Admit: 2019-06-15 | Discharge: 2019-06-15 | Disposition: A | Discharge status: Home / Self Care | Payer: Medicare Other | Source: Ambulatory Visit | Attending: Physical Medicine and Rehabilitation | Admitting: Physical Medicine and Rehabilitation

## 2019-06-15 ENCOUNTER — Other Ambulatory Visit: Payer: Self-pay

## 2019-06-15 DIAGNOSIS — M5412 Radiculopathy, cervical region: Secondary | ICD-10-CM | POA: Diagnosis not present

## 2019-07-21 ENCOUNTER — Other Ambulatory Visit: Payer: Self-pay | Admitting: Internal Medicine

## 2019-07-21 DIAGNOSIS — Z20822 Contact with and (suspected) exposure to covid-19: Secondary | ICD-10-CM

## 2019-07-22 LAB — NOVEL CORONAVIRUS, NAA: SARS-CoV-2, NAA: NOT DETECTED

## 2019-07-30 ENCOUNTER — Other Ambulatory Visit: Payer: Self-pay | Admitting: Specialist

## 2019-07-30 DIAGNOSIS — J849 Interstitial pulmonary disease, unspecified: Secondary | ICD-10-CM

## 2019-08-27 ENCOUNTER — Ambulatory Visit: Payer: Medicare Other

## 2019-09-16 ENCOUNTER — Ambulatory Visit
Admission: RE | Admit: 2019-09-16 | Discharge: 2019-09-16 | Disposition: A | Discharge status: Home / Self Care | Payer: Medicare Other | Source: Ambulatory Visit | Attending: Specialist | Admitting: Specialist

## 2019-09-16 ENCOUNTER — Other Ambulatory Visit: Payer: Self-pay

## 2019-09-16 DIAGNOSIS — J849 Interstitial pulmonary disease, unspecified: Secondary | ICD-10-CM | POA: Diagnosis present

## 2019-11-13 ENCOUNTER — Other Ambulatory Visit: Payer: Self-pay | Admitting: Family Medicine

## 2019-11-13 DIAGNOSIS — R1032 Left lower quadrant pain: Secondary | ICD-10-CM

## 2019-11-27 ENCOUNTER — Other Ambulatory Visit: Payer: Self-pay

## 2019-11-27 ENCOUNTER — Ambulatory Visit
Admission: RE | Admit: 2019-11-27 | Discharge: 2019-11-27 | Disposition: A | Discharge status: Home / Self Care | Payer: Medicare PPO | Source: Ambulatory Visit | Attending: Family Medicine | Admitting: Family Medicine

## 2019-11-27 DIAGNOSIS — R1032 Left lower quadrant pain: Secondary | ICD-10-CM | POA: Insufficient documentation

## 2019-11-27 LAB — POCT I-STAT CREATININE: Creatinine, Ser: 0.9 mg/dL (ref 0.44–1.00)

## 2019-11-27 MED ORDER — IOHEXOL 300 MG/ML  SOLN
100.0000 mL | Freq: Once | INTRAMUSCULAR | Status: AC | PRN
  Administered 2019-11-27: 100 mL via INTRAVENOUS

## 2020-09-08 ENCOUNTER — Other Ambulatory Visit: Payer: Self-pay | Admitting: Family Medicine

## 2020-09-08 DIAGNOSIS — Z1231 Encounter for screening mammogram for malignant neoplasm of breast: Secondary | ICD-10-CM

## 2020-10-14 ENCOUNTER — Other Ambulatory Visit: Payer: Self-pay

## 2020-10-14 ENCOUNTER — Ambulatory Visit
Admission: RE | Admit: 2020-10-14 | Discharge: 2020-10-14 | Disposition: A | Discharge status: Home / Self Care | Payer: Medicare PPO | Source: Ambulatory Visit | Attending: Family Medicine | Admitting: Family Medicine

## 2020-10-14 DIAGNOSIS — Z1231 Encounter for screening mammogram for malignant neoplasm of breast: Secondary | ICD-10-CM | POA: Diagnosis present

## 2020-12-16 DIAGNOSIS — L989 Disorder of the skin and subcutaneous tissue, unspecified: Secondary | ICD-10-CM | POA: Diagnosis not present

## 2021-01-04 DIAGNOSIS — D485 Neoplasm of uncertain behavior of skin: Secondary | ICD-10-CM | POA: Diagnosis not present

## 2021-01-04 DIAGNOSIS — L570 Actinic keratosis: Secondary | ICD-10-CM | POA: Diagnosis not present

## 2021-01-04 DIAGNOSIS — D0471 Carcinoma in situ of skin of right lower limb, including hip: Secondary | ICD-10-CM | POA: Diagnosis not present

## 2021-03-06 DIAGNOSIS — R0600 Dyspnea, unspecified: Secondary | ICD-10-CM | POA: Diagnosis not present

## 2021-03-06 DIAGNOSIS — F411 Generalized anxiety disorder: Secondary | ICD-10-CM | POA: Diagnosis not present

## 2021-03-06 DIAGNOSIS — J439 Emphysema, unspecified: Secondary | ICD-10-CM | POA: Diagnosis not present

## 2021-03-06 DIAGNOSIS — F17210 Nicotine dependence, cigarettes, uncomplicated: Secondary | ICD-10-CM | POA: Diagnosis not present

## 2021-04-05 DIAGNOSIS — E119 Type 2 diabetes mellitus without complications: Secondary | ICD-10-CM | POA: Diagnosis not present

## 2021-04-05 DIAGNOSIS — Z0100 Encounter for examination of eyes and vision without abnormal findings: Secondary | ICD-10-CM | POA: Diagnosis not present

## 2021-04-11 DIAGNOSIS — Z0000 Encounter for general adult medical examination without abnormal findings: Secondary | ICD-10-CM | POA: Diagnosis not present

## 2021-04-11 DIAGNOSIS — F17200 Nicotine dependence, unspecified, uncomplicated: Secondary | ICD-10-CM | POA: Diagnosis not present

## 2021-04-13 DIAGNOSIS — F17200 Nicotine dependence, unspecified, uncomplicated: Secondary | ICD-10-CM | POA: Diagnosis not present

## 2021-04-13 DIAGNOSIS — F419 Anxiety disorder, unspecified: Secondary | ICD-10-CM | POA: Diagnosis not present

## 2021-04-13 DIAGNOSIS — E782 Mixed hyperlipidemia: Secondary | ICD-10-CM | POA: Diagnosis not present

## 2021-04-13 DIAGNOSIS — E1142 Type 2 diabetes mellitus with diabetic polyneuropathy: Secondary | ICD-10-CM | POA: Diagnosis not present

## 2021-06-22 ENCOUNTER — Inpatient Hospital Stay
Admission: EM | Admit: 2021-06-22 | Discharge: 2021-06-28 | DRG: 356 | Disposition: A | Discharge status: Home / Self Care | Payer: Medicare HMO | Attending: Internal Medicine | Admitting: Internal Medicine

## 2021-06-22 ENCOUNTER — Emergency Department: Payer: Medicare HMO

## 2021-06-22 ENCOUNTER — Encounter: Payer: Self-pay | Admitting: Emergency Medicine

## 2021-06-22 ENCOUNTER — Other Ambulatory Visit: Payer: Self-pay

## 2021-06-22 DIAGNOSIS — E1140 Type 2 diabetes mellitus with diabetic neuropathy, unspecified: Secondary | ICD-10-CM | POA: Diagnosis present

## 2021-06-22 DIAGNOSIS — Z833 Family history of diabetes mellitus: Secondary | ICD-10-CM

## 2021-06-22 DIAGNOSIS — Z720 Tobacco use: Secondary | ICD-10-CM

## 2021-06-22 DIAGNOSIS — I70229 Atherosclerosis of native arteries of extremities with rest pain, unspecified extremity: Secondary | ICD-10-CM | POA: Diagnosis present

## 2021-06-22 DIAGNOSIS — F17210 Nicotine dependence, cigarettes, uncomplicated: Secondary | ICD-10-CM | POA: Diagnosis present

## 2021-06-22 DIAGNOSIS — J449 Chronic obstructive pulmonary disease, unspecified: Secondary | ICD-10-CM | POA: Diagnosis present

## 2021-06-22 DIAGNOSIS — Z8249 Family history of ischemic heart disease and other diseases of the circulatory system: Secondary | ICD-10-CM

## 2021-06-22 DIAGNOSIS — I672 Cerebral atherosclerosis: Secondary | ICD-10-CM | POA: Diagnosis not present

## 2021-06-22 DIAGNOSIS — G8929 Other chronic pain: Secondary | ICD-10-CM | POA: Diagnosis present

## 2021-06-22 DIAGNOSIS — E872 Acidosis, unspecified: Secondary | ICD-10-CM

## 2021-06-22 DIAGNOSIS — I714 Abdominal aortic aneurysm, without rupture: Secondary | ICD-10-CM | POA: Diagnosis present

## 2021-06-22 DIAGNOSIS — R58 Hemorrhage, not elsewhere classified: Secondary | ICD-10-CM | POA: Diagnosis not present

## 2021-06-22 DIAGNOSIS — I639 Cerebral infarction, unspecified: Secondary | ICD-10-CM | POA: Diagnosis not present

## 2021-06-22 DIAGNOSIS — I69351 Hemiplegia and hemiparesis following cerebral infarction affecting right dominant side: Secondary | ICD-10-CM | POA: Diagnosis not present

## 2021-06-22 DIAGNOSIS — Z7983 Long term (current) use of bisphosphonates: Secondary | ICD-10-CM

## 2021-06-22 DIAGNOSIS — E1151 Type 2 diabetes mellitus with diabetic peripheral angiopathy without gangrene: Secondary | ICD-10-CM | POA: Diagnosis not present

## 2021-06-22 DIAGNOSIS — R710 Precipitous drop in hematocrit: Secondary | ICD-10-CM | POA: Diagnosis not present

## 2021-06-22 DIAGNOSIS — F32A Depression, unspecified: Secondary | ICD-10-CM | POA: Diagnosis not present

## 2021-06-22 DIAGNOSIS — F17200 Nicotine dependence, unspecified, uncomplicated: Secondary | ICD-10-CM | POA: Diagnosis present

## 2021-06-22 DIAGNOSIS — Z981 Arthrodesis status: Secondary | ICD-10-CM

## 2021-06-22 DIAGNOSIS — I69341 Monoplegia of lower limb following cerebral infarction affecting right dominant side: Secondary | ICD-10-CM

## 2021-06-22 DIAGNOSIS — I6381 Other cerebral infarction due to occlusion or stenosis of small artery: Secondary | ICD-10-CM | POA: Diagnosis not present

## 2021-06-22 DIAGNOSIS — I70291 Other atherosclerosis of native arteries of extremities, right leg: Secondary | ICD-10-CM | POA: Diagnosis present

## 2021-06-22 DIAGNOSIS — K55059 Acute (reversible) ischemia of intestine, part and extent unspecified: Secondary | ICD-10-CM | POA: Diagnosis not present

## 2021-06-22 DIAGNOSIS — R109 Unspecified abdominal pain: Secondary | ICD-10-CM | POA: Diagnosis not present

## 2021-06-22 DIAGNOSIS — R6510 Systemic inflammatory response syndrome (SIRS) of non-infectious origin without acute organ dysfunction: Secondary | ICD-10-CM | POA: Diagnosis not present

## 2021-06-22 DIAGNOSIS — K551 Chronic vascular disorders of intestine: Secondary | ICD-10-CM | POA: Diagnosis present

## 2021-06-22 DIAGNOSIS — E785 Hyperlipidemia, unspecified: Secondary | ICD-10-CM | POA: Diagnosis not present

## 2021-06-22 DIAGNOSIS — E119 Type 2 diabetes mellitus without complications: Secondary | ICD-10-CM | POA: Diagnosis not present

## 2021-06-22 DIAGNOSIS — I700 Atherosclerosis of aorta: Secondary | ICD-10-CM | POA: Diagnosis present

## 2021-06-22 DIAGNOSIS — H539 Unspecified visual disturbance: Secondary | ICD-10-CM

## 2021-06-22 DIAGNOSIS — Z7982 Long term (current) use of aspirin: Secondary | ICD-10-CM

## 2021-06-22 DIAGNOSIS — F419 Anxiety disorder, unspecified: Secondary | ICD-10-CM

## 2021-06-22 DIAGNOSIS — G4733 Obstructive sleep apnea (adult) (pediatric): Secondary | ICD-10-CM | POA: Diagnosis present

## 2021-06-22 DIAGNOSIS — I6523 Occlusion and stenosis of bilateral carotid arteries: Secondary | ICD-10-CM | POA: Diagnosis not present

## 2021-06-22 DIAGNOSIS — I998 Other disorder of circulatory system: Secondary | ICD-10-CM | POA: Diagnosis not present

## 2021-06-22 DIAGNOSIS — I10 Essential (primary) hypertension: Secondary | ICD-10-CM | POA: Diagnosis present

## 2021-06-22 DIAGNOSIS — E1165 Type 2 diabetes mellitus with hyperglycemia: Secondary | ICD-10-CM | POA: Diagnosis present

## 2021-06-22 DIAGNOSIS — E11649 Type 2 diabetes mellitus with hypoglycemia without coma: Secondary | ICD-10-CM | POA: Diagnosis present

## 2021-06-22 DIAGNOSIS — I70222 Atherosclerosis of native arteries of extremities with rest pain, left leg: Secondary | ICD-10-CM

## 2021-06-22 DIAGNOSIS — R55 Syncope and collapse: Secondary | ICD-10-CM

## 2021-06-22 DIAGNOSIS — R519 Headache, unspecified: Secondary | ICD-10-CM | POA: Diagnosis not present

## 2021-06-22 DIAGNOSIS — K683 Retroperitoneal hematoma: Secondary | ICD-10-CM

## 2021-06-22 DIAGNOSIS — A419 Sepsis, unspecified organism: Secondary | ICD-10-CM | POA: Diagnosis not present

## 2021-06-22 DIAGNOSIS — R29818 Other symptoms and signs involving the nervous system: Secondary | ICD-10-CM | POA: Diagnosis not present

## 2021-06-22 DIAGNOSIS — Z20822 Contact with and (suspected) exposure to covid-19: Secondary | ICD-10-CM | POA: Diagnosis not present

## 2021-06-22 DIAGNOSIS — M47812 Spondylosis without myelopathy or radiculopathy, cervical region: Secondary | ICD-10-CM | POA: Diagnosis not present

## 2021-06-22 DIAGNOSIS — E1142 Type 2 diabetes mellitus with diabetic polyneuropathy: Secondary | ICD-10-CM | POA: Insufficient documentation

## 2021-06-22 DIAGNOSIS — K625 Hemorrhage of anus and rectum: Secondary | ICD-10-CM | POA: Diagnosis not present

## 2021-06-22 DIAGNOSIS — Z043 Encounter for examination and observation following other accident: Secondary | ICD-10-CM | POA: Diagnosis not present

## 2021-06-22 DIAGNOSIS — I6521 Occlusion and stenosis of right carotid artery: Secondary | ICD-10-CM | POA: Diagnosis present

## 2021-06-22 DIAGNOSIS — Z8261 Family history of arthritis: Secondary | ICD-10-CM

## 2021-06-22 DIAGNOSIS — Z7984 Long term (current) use of oral hypoglycemic drugs: Secondary | ICD-10-CM

## 2021-06-22 DIAGNOSIS — I6503 Occlusion and stenosis of bilateral vertebral arteries: Secondary | ICD-10-CM | POA: Diagnosis not present

## 2021-06-22 DIAGNOSIS — R200 Anesthesia of skin: Secondary | ICD-10-CM | POA: Diagnosis not present

## 2021-06-22 DIAGNOSIS — Z79899 Other long term (current) drug therapy: Secondary | ICD-10-CM

## 2021-06-22 DIAGNOSIS — E1169 Type 2 diabetes mellitus with other specified complication: Secondary | ICD-10-CM | POA: Diagnosis not present

## 2021-06-22 DIAGNOSIS — I2510 Atherosclerotic heart disease of native coronary artery without angina pectoris: Secondary | ICD-10-CM | POA: Diagnosis present

## 2021-06-22 DIAGNOSIS — D62 Acute posthemorrhagic anemia: Secondary | ICD-10-CM

## 2021-06-22 DIAGNOSIS — K219 Gastro-esophageal reflux disease without esophagitis: Secondary | ICD-10-CM | POA: Diagnosis present

## 2021-06-22 DIAGNOSIS — Z8673 Personal history of transient ischemic attack (TIA), and cerebral infarction without residual deficits: Secondary | ICD-10-CM | POA: Diagnosis not present

## 2021-06-22 LAB — RESP PANEL BY RT-PCR (FLU A&B, COVID) ARPGX2
Influenza A by PCR: NEGATIVE
Influenza B by PCR: NEGATIVE
SARS Coronavirus 2 by RT PCR: NEGATIVE

## 2021-06-22 LAB — URINALYSIS, COMPLETE (UACMP) WITH MICROSCOPIC
Bacteria, UA: NONE SEEN
Bilirubin Urine: NEGATIVE
Glucose, UA: 50 mg/dL — AB
Hgb urine dipstick: NEGATIVE
Ketones, ur: 5 mg/dL — AB
Leukocytes,Ua: NEGATIVE
Nitrite: NEGATIVE
Protein, ur: NEGATIVE mg/dL
Specific Gravity, Urine: 1.01 (ref 1.005–1.030)
pH: 6 (ref 5.0–8.0)

## 2021-06-22 LAB — TROPONIN I (HIGH SENSITIVITY)
Troponin I (High Sensitivity): 6 ng/L (ref <18)
Troponin I (High Sensitivity): 8 ng/L (ref <18)

## 2021-06-22 LAB — BASIC METABOLIC PANEL
Anion gap: 16 — ABNORMAL HIGH (ref 5–15)
BUN: 14 mg/dL (ref 8–23)
CO2: 16 mmol/L — ABNORMAL LOW (ref 22–32)
Calcium: 8.7 mg/dL — ABNORMAL LOW (ref 8.9–10.3)
Chloride: 103 mmol/L (ref 98–111)
Creatinine, Ser: 0.99 mg/dL (ref 0.44–1.00)
GFR, Estimated: >60 mL/min (ref >60)
Glucose, Bld: 276 mg/dL — ABNORMAL HIGH (ref 70–99)
Potassium: 3.4 mmol/L — ABNORMAL LOW (ref 3.5–5.1)
Sodium: 135 mmol/L (ref 135–145)

## 2021-06-22 LAB — CBC
HCT: 43.5 % (ref 36.0–46.0)
Hemoglobin: 14.1 g/dL (ref 12.0–15.0)
MCH: 28.7 pg (ref 26.0–34.0)
MCHC: 32.4 g/dL (ref 30.0–36.0)
MCV: 88.4 fL (ref 80.0–100.0)
Platelets: 307 10*3/uL (ref 150–400)
RBC: 4.92 MIL/uL (ref 3.87–5.11)
RDW: 16.1 % — ABNORMAL HIGH (ref 11.5–15.5)
WBC: 22.1 10*3/uL — ABNORMAL HIGH (ref 4.0–10.5)
nRBC: 0 % (ref 0.0–0.2)

## 2021-06-22 LAB — LACTIC ACID, PLASMA
Lactic Acid, Venous: 5.6 mmol/L (ref 0.5–1.9)
Lactic Acid, Venous: 6.7 mmol/L (ref 0.5–1.9)

## 2021-06-22 LAB — PROTIME-INR
INR: 1 (ref 0.8–1.2)
Prothrombin Time: 12.8 seconds (ref 11.4–15.2)

## 2021-06-22 LAB — APTT: aPTT: 23 seconds — ABNORMAL LOW (ref 24–36)

## 2021-06-22 MED ORDER — SODIUM CHLORIDE 0.9 % IV SOLN
2.0000 g | Freq: Once | INTRAVENOUS | Status: AC
  Administered 2021-06-22: 2 g via INTRAVENOUS
  Filled 2021-06-22: qty 2

## 2021-06-22 MED ORDER — SODIUM CHLORIDE 0.9 % IV SOLN
1.0000 g | Freq: Once | INTRAVENOUS | Status: AC
  Administered 2021-06-22: 1 g via INTRAVENOUS
  Filled 2021-06-22: qty 10

## 2021-06-22 MED ORDER — HEPARIN (PORCINE) 25000 UT/250ML-% IV SOLN
1100.0000 [IU]/h | INTRAVENOUS | Status: DC
  Administered 2021-06-22: 950 [IU]/h via INTRAVENOUS
  Filled 2021-06-22: qty 250

## 2021-06-22 MED ORDER — LACTATED RINGERS IV BOLUS
1000.0000 mL | Freq: Once | INTRAVENOUS | Status: AC
  Administered 2021-06-22: 1000 mL via INTRAVENOUS

## 2021-06-22 MED ORDER — IOHEXOL 350 MG/ML SOLN
100.0000 mL | Freq: Once | INTRAVENOUS | Status: AC | PRN
  Administered 2021-06-22: 100 mL via INTRAVENOUS

## 2021-06-22 MED ORDER — METRONIDAZOLE 500 MG/100ML IV SOLN
500.0000 mg | Freq: Once | INTRAVENOUS | Status: AC
  Administered 2021-06-22: 500 mg via INTRAVENOUS
  Filled 2021-06-22: qty 100

## 2021-06-22 MED ORDER — LACTATED RINGERS IV SOLN: INTRAVENOUS | Status: DC

## 2021-06-22 MED ORDER — VANCOMYCIN HCL IN DEXTROSE 1-5 GM/200ML-% IV SOLN
1000.0000 mg | Freq: Once | INTRAVENOUS | Status: AC
  Administered 2021-06-22: 1000 mg via INTRAVENOUS
  Filled 2021-06-22: qty 200

## 2021-06-22 MED ORDER — HEPARIN SODIUM (PORCINE) 5000 UNIT/ML IJ SOLN
60.0000 [IU]/kg | Freq: Once | INTRAMUSCULAR | Status: AC
  Administered 2021-06-22: 3800 [IU] via INTRAVENOUS
  Filled 2021-06-22: qty 1

## 2021-06-22 NOTE — ED Triage Notes (Signed)
Pt states that she also has numbness in both legs which is causing leg pain as well.  Pt found in the bathroom attempting to get up from the toilet, but was unable to independently.  Pt able to stand with assistance.

## 2021-06-22 NOTE — Progress Notes (Signed)
CODE SEPSIS - PHARMACY COMMUNICATION  **Broad Spectrum Antibiotics should be administered within 1 hour of Sepsis diagnosis**  Time Code Sepsis Called/Page Received: 2229  Antibiotics Ordered: Cefepime + vancomycin + metronidazole  Time of 1st antibiotic administration: 2239  Additional action taken by pharmacy: N/A  Benita Gutter 06/22/2021  10:28 PM

## 2021-06-22 NOTE — ED Provider Notes (Signed)
[facility] Emergency Department Provider Note ____________________________________________   Event Date/Time   First MD Initiated Contact with Patient 06/22/21 2022     (approximate)  I have reviewed the triage vital signs and the nursing notes.  HISTORY  Chief Complaint Loss of Consciousness, Leg Pain, and Numbness   HPI Autumn Arnold is a 70 y.o. femalewho presents to the ED for evaluation of a syncopal episode.  Chart review indicates HTN, HLD, DM, GERD and stroke.  History of left leg weakness from this remote stroke.  Continued cigarette smoker.  Patient presents to the ED after a syncopal episode at the local plasma center.  She reports dizziness upon standing after donating plasma and then syncope.  She reports she was incontinent of urine as she awoke with wet pants.  She reports a few days of dysuria and urinary frequency.  She reports chills at home without documented fever.  Denies any other syncopal episodes beyond today.  Upon awakening from the syncopal episode, she reports acute on chronic pain to her bilateral lower extremities that she attributes to neuropathic pain.  Denies chest pain or headache.  She reports feeling better now and is requesting medication for the pain to her left leg.  She reports it is diffuse throughout her left leg.  Burning 6-10 intensity.  Past Medical History:  Diagnosis Date   Anxiety    Arthritis    Asthma    Bilateral lower extremity edema    COPD (chronic obstructive pulmonary disease) (HCC)    Cough    CHRONIC   Diabetes mellitus 2008   Edema    LEGS/FEET   GERD (gastroesophageal reflux disease)    Hyperlipidemia    Hypertension    Shortness of breath dyspnea    DOE   Sleep apnea    CPAP at [facility], Dr. Humphrey Rolls   Stroke [facility]) 02/2010   headache, left arm numbness   Wheezing     Patient Active Problem List   Diagnosis Date Noted   CVA (cerebral infarction) 11/07/2015    Past Surgical History:   Procedure Laterality Date   ABDOMINAL HYSTERECTOMY     ANTERIOR FUSION CERVICAL SPINE     CARPAL TUNNEL RELEASE     Bilateral   CATARACT EXTRACTION W/PHACO Left 03/12/2016   Procedure: CATARACT EXTRACTION PHACO AND INTRAOCULAR LENS PLACEMENT ([place]);  Surgeon: Estill Cotta, MD;  Location: [facility] ORS;  Service: Ophthalmology;  Laterality: Left;  [place] 01:12 AP% 23.4 CDE 29.83 fluid pack lot # [id number]   CATARACT EXTRACTION W/PHACO Right 04/16/2016   Procedure: CATARACT EXTRACTION PHACO AND INTRAOCULAR LENS PLACEMENT (IOC);  Surgeon: Estill Cotta, MD;  Location: [facility] ORS;  Service: Ophthalmology;  Laterality: Right;  [place] 01:17 AP% 22.5 CDE 35.08 Fluid pack lot # [id number]   CESAREAN SECTION     COLONOSCOPY WITH PROPOFOL N/A 08/12/2018   Procedure: COLONOSCOPY WITH PROPOFOL;  Surgeon: Toledo, Benay Pike, MD;  Location: [facility] ENDOSCOPY;  Service: Gastroenterology;  Laterality: N/A;   ELBOW SURGERY     Tendonitis   ESOPHAGOGASTRODUODENOSCOPY (EGD) WITH PROPOFOL N/A 08/12/2018   Procedure: ESOPHAGOGASTRODUODENOSCOPY (EGD) WITH PROPOFOL;  Surgeon: Toledo, Benay Pike, MD;  Location: [facility] ENDOSCOPY;  Service: Gastroenterology;  Laterality: N/A;   INCONTINENCE SURGERY     TONSILLECTOMY      Prior to Admission medications   Medication Sig Start Date End Date Taking? Authorizing Provider  busPIRone (BUSPAR) 5 MG tablet Take 5 mg by mouth 2 (two) times daily. 04/18/21  Yes  [provider]  albuterol (PROAIR HFA) 108 (90 Base) MCG/ACT inhaler Inhale 2 puffs into the lungs as needed.    [provider]  alendronate (FOSAMAX) 70 MG tablet Take 70 mg by mouth once a week. Take with a full glass of water on an empty stomach.    [provider]  amitriptyline (ELAVIL) 150 MG tablet Take by mouth daily. Reported on 12/20/2015    [provider]  aspirin EC 81 MG EC tablet Take 1 tablet (81 mg total) by mouth daily. 11/08/15   Gladstone Lighter, MD   aspirin-acetaminophen-caffeine (EXCEDRIN MIGRAINE) [phone] MG tablet Take by mouth every 6 (six) hours as needed for headache.    [provider]  atorvastatin (LIPITOR) 40 MG tablet Take 1 tablet (40 mg total) by mouth daily at 6 PM. 11/08/15   Gladstone Lighter, MD  atorvastatin (LIPITOR) 80 MG tablet Take 80 mg by mouth daily. 06/13/21   [provider]  Calcium Carb-Cholecalciferol 600-400 MG-UNIT TABS Take 1 tablet by mouth 2 (two) times daily.    [provider]  glimepiride (AMARYL) 4 MG tablet Take 4 mg by mouth daily with breakfast.    [provider]  glipiZIDE (GLUCOTROL) 10 MG tablet Take 20 mg by mouth 2 (two) times daily. 06/13/21   [provider]  lisinopril (PRINIVIL,ZESTRIL) 2.5 MG tablet Take 2.5 mg by mouth daily.    [provider]  metFORMIN (GLUCOPHAGE) 1000 MG tablet Take 1,000 mg by mouth 2 (two) times daily. 06/13/21   [provider]  metFORMIN (GLUCOPHAGE) 500 MG tablet Take 1 tablet (500 mg total) by mouth 2 (two) times daily with a meal. Patient taking differently: Take 1,000 mg by mouth 2 (two) times daily with a meal.  11/08/15   Gladstone Lighter, MD  metoprolol tartrate (LOPRESSOR) 25 MG tablet Take 1 tablet (25 mg total) by mouth 2 (two) times daily. Patient taking differently: Take 50 mg by mouth 2 (two) times daily.  11/08/15   Gladstone Lighter, MD  montelukast (SINGULAIR) 10 MG tablet Take 10 mg by mouth daily. 06/13/21   [provider]  omeprazole (PRILOSEC) 40 MG capsule Take 40 mg by mouth daily.    [provider]  sertraline (ZOLOFT) 100 MG tablet Take 1 tablet (100 mg total) by mouth daily. 11/08/15   Gladstone Lighter, MD  tiZANidine (ZANAFLEX) 4 MG tablet Take 4 mg by mouth 2 (two) times daily. 05/22/21   [provider]  vitamin B-12 (CYANOCOBALAMIN) 1000 MCG tablet Take 1,000 mcg by mouth daily. Reported on 12/20/2015    [provider]     Allergies Lisinopril  Family History  Problem Relation Age of Onset   CAD Mother    Hypertension Mother    Arthritis Mother    Diabetes Father    CAD Father     Social History Social History   Tobacco Use   Smoking status: Every Day    Packs/day: 1.00    Types: Cigarettes   Smokeless tobacco: Never  Substance Use Topics   Alcohol use: Yes    Alcohol/week: 7.0 standard drinks    Types: 7 Cans of beer per week   Drug use: No    Review of Systems  Constitutional: No fever/chills Eyes: No visual changes. ENT: No sore throat. Cardiovascular: Denies chest pain. Respiratory: Denies shortness of breath. Gastrointestinal: No abdominal pain.  No nausea, no vomiting.  No diarrhea.  No constipation. Genitourinary: Positive for dysuria and frequency.  Positive  urinary incontinence. Musculoskeletal: Negative for back pain. Skin: Negative for rash. Neurological: Negative for headaches, focal weakness or numbness.  Positive for syncopal episode.  ____________________________________________   PHYSICAL EXAM:  VITAL SIGNS: Vitals:   06/22/21 2300 06/22/21 2315  BP: (!) 185/63   Pulse: (!) 107   Resp:  20  Temp:    SpO2: 96%     Constitutional: Alert and oriented. Well appearing and in no acute distress. Eyes: Conjunctivae are normal. PERRL. EOMI. Head: Atraumatic. Nose: No congestion/rhinnorhea. Mouth/Throat: Mucous membranes are moist.  Oropharynx non-erythematous. Neck: No stridor. No cervical spine tenderness to palpation. Cardiovascular: Tachycardic rate, regular rhythm. Grossly normal heart sounds.  Strong left-sided radial pulse 2+, but 1+ pulses to other 3 extremities. Respiratory: Normal respiratory effort.  No retractions. Lungs CTAB. Gastrointestinal: Soft , nondistended. No CVA tenderness. Suprapubic tenderness to palpation.  No peritoneal features.  Abdomen is otherwise benign. Musculoskeletal: No lower extremity tenderness nor edema.  No joint  effusions. No signs of acute trauma. No pain with logrolling or palpation to the bilateral lower extremities. Neurologic:  Normal speech and language.  4/5 strength of the left leg, otherwise strength intact throughout.  She reports this is about typical for her. Skin:  Skin is warm, dry and intact. No rash noted. Psychiatric: Mood and affect are normal. Speech and behavior are normal.  ABI to LLE at 1040pm 0.69 ABI to RLE 0.68 ____________________________________________   LABS (all labs ordered are listed, but only abnormal results are displayed)  Labs Reviewed  BASIC METABOLIC PANEL - Abnormal; Notable for the following components:      Result Value   Potassium 3.4 (*)    CO2 16 (*)    Glucose, Bld 276 (*)    Calcium 8.7 (*)    Anion gap 16 (*)    All other components within normal limits  CBC - Abnormal; Notable for the following components:   WBC 22.1 (*)    RDW 16.1 (*)    All other components within normal limits  URINALYSIS, COMPLETE (UACMP) WITH MICROSCOPIC - Abnormal; Notable for the following components:   Color, Urine YELLOW (*)    APPearance CLEAR (*)    Glucose, UA 50 (*)    Ketones, ur 5 (*)    All other components within normal limits  LACTIC ACID, PLASMA - Abnormal; Notable for the following components:   Lactic Acid, Venous 5.6 (*)    All other components within normal limits  LACTIC ACID, PLASMA - Abnormal; Notable for the following components:   Lactic Acid, Venous 6.7 (*)    All other components within normal limits  APTT - Abnormal; Notable for the following components:   aPTT 23 (*)    All other components within normal limits  RESP PANEL BY RT-PCR (FLU A&B, COVID) ARPGX2  CULTURE, BLOOD (ROUTINE X 2)  CULTURE, BLOOD (ROUTINE X 2)  URINE CULTURE  PROTIME-INR  CBC  HEPARIN LEVEL (UNFRACTIONATED)  TROPONIN I (HIGH SENSITIVITY)  TROPONIN I (HIGH SENSITIVITY)   ____________________________________________  12 Lead EKG  Sinus rhythm with a  rate of 116 bpm.  Normal axis and intervals.  No STEMI. ____________________________________________  RADIOLOGY  ED MD interpretation: CT head reviewed by me without evidence of acute intracranial pathology.  Official radiology report(s): CT HEAD WO CONTRAST  Result Date: 06/22/2021 CLINICAL DATA:  Syncope. EXAM: CT HEAD WITHOUT CONTRAST TECHNIQUE: Contiguous axial images were obtained from the base of the skull through the vertex without intravenous contrast. COMPARISON:  November 07, 2015 FINDINGS: Brain: There is mild cerebral atrophy with widening of the extra-axial spaces and ventricular dilatation. There are areas of decreased attenuation within the white matter tracts of the supratentorial brain, consistent with microvascular disease changes. Small chronic bilateral para thalamic lacunar infarcts are seen. Vascular: No hyperdense vessel or unexpected calcification. Skull: Normal. Negative for fracture or focal lesion. Sinuses/Orbits: No acute finding. Other: None. IMPRESSION: 1. No acute intracranial abnormality. 2. Generalized cerebral atrophy with chronic microvascular white matter disease. Electronically Signed   By: Virgina Norfolk M.D.   On: 06/22/2021 19:40   CT Angio Chest/Abd/Pel for Dissection W and/or Wo Contrast  Result Date: 06/22/2021 CLINICAL DATA:  Evaluate dissection. Syncope, poor perfusion to bilateral feet and right hand. eval dissection. Smoker/vasculopath. syncopal episode. Pt had urinary incontinence during syncopal episode. Episode was witnessed. EXAM: CT ANGIOGRAPHY CHEST, ABDOMEN AND PELVIS TECHNIQUE: Non-contrast CT of the chest was initially obtained. Multidetector CT imaging through the chest, abdomen and pelvis was performed using the standard protocol during bolus administration of intravenous contrast. Multiplanar reconstructed images and MIPs were obtained and reviewed to evaluate the vascular anatomy. CONTRAST:  160m OMNIPAQUE IOHEXOL 350 MG/ML SOLN COMPARISON:   CT abdomen pelvis 11/27/2019, CT chest 09/16/2019 FINDINGS: CTA CHEST FINDINGS Cardiovascular: Preferential opacification of the thoracic aorta. No evidence of thoracic aortic aneurysm or dissection. At least moderate atherosclerotic plaque of the thoracic aorta. Normal heart size. No significant pericardial effusion. Four-vessel coronary artery calcifications. The main pulmonary artery is normal in caliber. No central pulmonary embolus. Mediastinum/Nodes: No enlarged mediastinal, hilar, or axillary lymph nodes. Thyroid gland, trachea, and esophagus demonstrate no significant findings. Lungs/Pleura: No focal consolidation. No pulmonary nodule. No pulmonary mass. No pleural effusion. No pneumothorax. Musculoskeletal: No chest wall abnormality. No suspicious lytic or blastic osseous lesions. No acute displaced fracture. Review of the MIP images confirms the above findings. CTA ABDOMEN AND PELVIS FINDINGS VASCULAR Aorta: Severe calcified and noncalcified atherosclerotic plaque. Focal region of moderate to severe stenosis of the infrarenal abdominal aortic caliber extending for approximately 0.5 cm in craniocaudal dimension (8:99, 5:108) due to calcified and noncalcified atherosclerotic plaque. Normal caliber aorta without aneurysm, dissection, vasculitis or significant stenosis. Celiac: Atherosclerotic plaque. Patent without evidence of aneurysm, dissection, vasculitis or significant stenosis. SMA: Atherosclerotic plaque. Patent without evidence of aneurysm, dissection, vasculitis or significant stenosis. Renals: Atherosclerotic plaque. Both renal arteries are patent without evidence of aneurysm, dissection, vasculitis, fibromuscular dysplasia or significant stenosis. IMA: Atherosclerotic plaque. Patent without evidence of aneurysm, dissection, vasculitis or significant stenosis. Inflow: Focal severe stenosis of the proximal right common iliac artery (5:126, 7:75). Similarly focal severe stenosis of the distal left  common iliac artery (8:106, 5:1 30-131). Otherwise patent without evidence of aneurysm, dissection, vasculitis. Veins: No obvious venous abnormality within the limitations of this arterial phase study. Review of the MIP images confirms the above findings. NON-VASCULAR Hepatobiliary: No focal liver abnormality. No gallstones, gallbladder wall thickening, or pericholecystic fluid. No biliary dilatation. Pancreas: No focal lesion. Normal pancreatic contour. No surrounding inflammatory changes. No main pancreatic ductal dilatation. Spleen: Normal in size without focal abnormality. Adrenals/Urinary Tract: No adrenal nodule bilaterally. Bilateral kidneys enhance symmetrically. No hydronephrosis. No hydroureter. The urinary bladder is unremarkable. Stomach/Bowel: Stomach is within normal limits. No evidence of bowel wall thickening or dilatation. The appendix not definitely identified. Lymphatic: No lymphadenopathy. Reproductive: Status post hysterectomy. No adnexal masses. Other: No intraperitoneal free fluid. No intraperitoneal free gas. No organized fluid collection. Musculoskeletal: No abdominal wall hernia or abnormality. No suspicious lytic or  blastic osseous lesions. No acute displaced fracture. Review of the MIP images confirms the above findings. IMPRESSION: 1. No aortic dissection or aneurysm. 2. Severe atherosclerotic plaque with several areas of focal moderate to severe stenosis of the infrarenal abdominal aorta as well as bilateral common iliac arteries. Aortic Atherosclerosis (ICD10-I70.0). 3. Otherwise no acute intrathoracic, intra-abdominal, intrapelvic abnormality. Electronically Signed   By: Iven Finn M.D.   On: 06/22/2021 22:31    ____________________________________________   PROCEDURES and INTERVENTIONS  Procedure(s) performed (including Critical Care):  .1-3 Lead EKG Interpretation  Date/Time: 06/22/2021 10:24 PM Performed by: Vladimir Crofts, MD Authorized by: Vladimir Crofts, MD      Interpretation: abnormal     ECG rate:  116   ECG rate assessment: tachycardic     Rhythm: sinus tachycardia     Ectopy: none     Conduction: normal   .Critical Care  Date/Time: 06/22/2021 11:53 PM Performed by: Vladimir Crofts, MD Authorized by: Vladimir Crofts, MD   Critical care provider statement:    Critical care time (minutes):  45   Critical care was necessary to treat or prevent imminent or life-threatening deterioration of the following conditions:  Circulatory failure and sepsis   Critical care was time spent personally by me on the following activities:  Discussions with consultants, evaluation of patient's response to treatment, examination of patient, ordering and performing treatments and interventions, ordering and review of laboratory studies, ordering and review of radiographic studies, pulse oximetry, re-evaluation of patient's condition, obtaining history from patient or surrogate and review of old charts  Medications  lactated ringers infusion ( Intravenous New Bag/Given 06/22/21 2239)  metroNIDAZOLE (FLAGYL) IVPB 500 mg (500 mg Intravenous New Bag/Given 06/22/21 2240)  vancomycin (VANCOCIN) IVPB 1000 mg/200 mL premix (1,000 mg Intravenous New Bag/Given 06/22/21 2318)  heparin ADULT infusion 100 units/mL (25000 units/241m) (950 Units/hr Intravenous New Bag/Given 06/22/21 2311)  lactated ringers bolus 1,000 mL (0 mLs Intravenous Stopped 06/22/21 2158)  lactated ringers bolus 1,000 mL (0 mLs Intravenous Stopped 06/22/21 2147)  cefTRIAXone (ROCEPHIN) 1 g in sodium chloride 0.9 % 100 mL IVPB (0 g Intravenous Stopped 06/22/21 2213)  iohexol (OMNIPAQUE) 350 MG/ML injection 100 mL (100 mLs Intravenous Contrast Given 06/22/21 2206)  ceFEPIme (MAXIPIME) 2 g in sodium chloride 0.9 % 100 mL IVPB (0 g Intravenous Stopped 06/22/21 2309)  heparin injection 3,800 Units (3,800 Units Intravenous Bolus 06/22/21 2315)    ____________________________________________   MDM / ED COURSE   Lifetime  smoker and vasculopath presents to the ED with evidence of sepsis of unknown etiology as well as left leg ischemia requiring heparinization.  She is persistently tachycardic in a sinus tachycardia, but otherwise hemodynamically stable on room air.  Blood work with leukocytosis to 22,000 and lactic acidosis to 6.  She has a very mild anion gap that could represent DKA, and beta hydroxybutyric acid and venous blood gases pending the time of admission.  Originally anticipated urinary origin of sepsis due to her symptoms, and she was covered with Rocephin, but her urine returns without infectious features.  Therefore broaden spectrum to cefepime and vancomycin.  Considering her syncopal episode and complaints of back pain, CTA chest obtained and demonstrates no aortic dissection or significant pathology to the chest, abdomen or pelvis.  Considering her vascular compromise to her left leg, associated pain, we will heparinize and anticipate angiography in the morning.  Admitted to medicine for further work-up and management.  Clinical Course as of 06/22/21 2332  Thu Jun 22, 2021  2225 Surprisingly, urine is noninfectious.  Uncertain source of sepsis.  We will therefore broaden coverage [DS]  2243 Reassessed.  She reports persistent pain to her left ankle.  I again examined her lower extremities and noticed more significant asymmetric vascular exam than my initial presentation.  Her right foot is definitely pink and has better capillary refill than the left.  We discussed peripheral artery disease and likely need for anticoagulation and angiogram.  We discussed signs of sepsis without clear source.  I asked nursing to acquire ABIs [DS]  2249 I discuss with Dr. Delana Meyer, he agrees with heparinaztion, hospitalist admit, and likely angiogram in the morning. [DS]    Clinical Course User Index [DS] Vladimir Crofts, MD    ____________________________________________   FINAL CLINICAL IMPRESSION(S) / ED  DIAGNOSES  Final diagnoses:  Sepsis without acute organ dysfunction, due to unspecified organism [facility])  Lower limb ischemia  Syncope and collapse     ED Discharge Orders     None        Shaddai Shapley   Note:  This document was prepared using Dragon voice recognition software and may include unintentional dictation errors.    Vladimir Crofts, MD 06/22/21 [phone]

## 2021-06-22 NOTE — ED Notes (Addendum)
Right arm blood pressure 165/68 Right leg blood pressure  113/94 Left leg blood pressure  122/35 Left arm blood pressure  177/66

## 2021-06-22 NOTE — ED Notes (Signed)
Lab reports lactic 5.6; acuity level changed and charge nurse notified

## 2021-06-22 NOTE — ED Notes (Signed)
md in with pt

## 2021-06-22 NOTE — ED Notes (Signed)
Pt to ct scan.

## 2021-06-22 NOTE — Consult Note (Signed)
PHARMACY -  BRIEF ANTIBIOTIC NOTE   Pharmacy has received consult(s) for vancomycin and cefepime from an ED provider. Patient is also ordered metronidazole. The patient's profile has been reviewed for ht/wt/allergies/indication/available labs.    One time order(s) placed for  --Vancomycin 1 g IV --Cefepime 2 g IV  Further antibiotics/pharmacy consults should be ordered by admitting physician if indicated.                       Thank you, Benita Gutter 06/22/2021  10:28 PM

## 2021-06-22 NOTE — ED Notes (Signed)
md at bedside

## 2021-06-22 NOTE — ED Triage Notes (Signed)
Pt comes into the ED via [facility] from the plasma center c/o syncopal episode.  Pt had urinary incontinence during syncopal episode.  Episode was witnessed.  Currently A&Ox4.  Pt denies any complaints at this time.  Pt has tried to give plasma 3 days in row but today was the first time she has had the plasma donation completed.    100/70 215 CBG 12 lead unremarkable

## 2021-06-22 NOTE — Consult Note (Signed)
[place] for IV Heparin Indication:  PAD  / limb ischemia  Patient Measurements: Heparin Dosing Weight: 56.9 kg  Labs: Recent Labs    06/22/21 1917 06/22/21 2145  HGB 14.1  --   HCT 43.5  --   PLT 307  --   CREATININE 0.99  --   TROPONINIHS 6 8    Estimated Creatinine Clearance: 44 mL/min (by C-G formula based on SCr of 0.99 mg/dL).   Medical History: Past Medical History:  Diagnosis Date   Anxiety    Arthritis    Asthma    Bilateral lower extremity edema    COPD (chronic obstructive pulmonary disease) ([place])    Cough    CHRONIC   Diabetes mellitus 2008   Edema    LEGS/FEET   GERD (gastroesophageal reflux disease)    Hyperlipidemia    Hypertension    Shortness of breath dyspnea    DOE   Sleep apnea    CPAP at [facility], Dr. Humphrey Rolls   Stroke [facility]) 02/2010   headache, left arm numbness   Wheezing     Medications:  No anticoagulation prior to admission per my chart review. Medication reconciliation is pending  Assessment: Patient is a 70 y/o F with medical history as above who presented to the ED with syncopal episode while donating plasma. She is also endorsing leg pain and numbness. Lactate level elevated. Imaging with "severe atherosclerotic plaque with several areas of focal moderate to severe stenosis of the infrarenal abdominal aorta as well as bilateral common iliac arteries". Pharmacy has been consulted to initiate heparin infusion for PAD / limb ischemia.  Baseline CBC acceptable. Baseline aPTT and PT-INR are pending.  Goal of Therapy:  Heparin level 0.3-0.7 units/ml Monitor platelets by anticoagulation protocol: Yes   Plan:  --Heparin 3800 unit IV bolus per EDP followed by continuous infusion at 950 units/hr --Will check HL 6 hours after initiation of infusion --Daily CBC per protocol while on IV heparin  Benita Gutter 06/22/2021,10:47 PM

## 2021-06-22 NOTE — Sepsis Progress Note (Signed)
Following for sepsis monitoring ?

## 2021-06-23 ENCOUNTER — Inpatient Hospital Stay: Payer: Medicare HMO

## 2021-06-23 ENCOUNTER — Encounter
Admission: EM | Disposition: A | Discharge status: Home / Self Care | Payer: Self-pay | Source: Home / Self Care | Attending: Internal Medicine

## 2021-06-23 DIAGNOSIS — F419 Anxiety disorder, unspecified: Secondary | ICD-10-CM | POA: Diagnosis not present

## 2021-06-23 DIAGNOSIS — F32A Depression, unspecified: Secondary | ICD-10-CM

## 2021-06-23 DIAGNOSIS — E119 Type 2 diabetes mellitus without complications: Secondary | ICD-10-CM | POA: Diagnosis not present

## 2021-06-23 DIAGNOSIS — I639 Cerebral infarction, unspecified: Secondary | ICD-10-CM

## 2021-06-23 DIAGNOSIS — K625 Hemorrhage of anus and rectum: Secondary | ICD-10-CM

## 2021-06-23 DIAGNOSIS — R55 Syncope and collapse: Secondary | ICD-10-CM | POA: Diagnosis not present

## 2021-06-23 DIAGNOSIS — I69351 Hemiplegia and hemiparesis following cerebral infarction affecting right dominant side: Secondary | ICD-10-CM | POA: Diagnosis not present

## 2021-06-23 DIAGNOSIS — Z720 Tobacco use: Secondary | ICD-10-CM | POA: Diagnosis not present

## 2021-06-23 DIAGNOSIS — A419 Sepsis, unspecified organism: Secondary | ICD-10-CM | POA: Diagnosis not present

## 2021-06-23 DIAGNOSIS — I70229 Atherosclerosis of native arteries of extremities with rest pain, unspecified extremity: Secondary | ICD-10-CM | POA: Diagnosis not present

## 2021-06-23 DIAGNOSIS — R6510 Systemic inflammatory response syndrome (SIRS) of non-infectious origin without acute organ dysfunction: Secondary | ICD-10-CM

## 2021-06-23 DIAGNOSIS — I998 Other disorder of circulatory system: Secondary | ICD-10-CM | POA: Diagnosis not present

## 2021-06-23 HISTORY — DX: Cerebral infarction, unspecified: I63.9

## 2021-06-23 HISTORY — PX: LOWER EXTREMITY ANGIOGRAPHY: CATH118251

## 2021-06-23 LAB — LIPID PANEL
Cholesterol: 250 mg/dL — ABNORMAL HIGH (ref 0–200)
HDL: 38 mg/dL — ABNORMAL LOW (ref >40)
LDL Cholesterol: 164 mg/dL — ABNORMAL HIGH (ref 0–99)
Total CHOL/HDL Ratio: 6.6 RATIO
Triglycerides: 242 mg/dL — ABNORMAL HIGH (ref <150)
VLDL: 48 mg/dL — ABNORMAL HIGH (ref 0–40)

## 2021-06-23 LAB — BASIC METABOLIC PANEL
Anion gap: 14 (ref 5–15)
BUN: 12 mg/dL (ref 8–23)
CO2: 20 mmol/L — ABNORMAL LOW (ref 22–32)
Calcium: 8.3 mg/dL — ABNORMAL LOW (ref 8.9–10.3)
Chloride: 98 mmol/L (ref 98–111)
Creatinine, Ser: 0.92 mg/dL (ref 0.44–1.00)
GFR, Estimated: >60 mL/min (ref >60)
Glucose, Bld: 245 mg/dL — ABNORMAL HIGH (ref 70–99)
Potassium: 3.9 mmol/L (ref 3.5–5.1)
Sodium: 132 mmol/L — ABNORMAL LOW (ref 135–145)

## 2021-06-23 LAB — HEPARIN LEVEL (UNFRACTIONATED)
Heparin Unfractionated: 0.17 IU/mL — ABNORMAL LOW (ref 0.30–0.70)
Heparin Unfractionated: 0.23 IU/mL — ABNORMAL LOW (ref 0.30–0.70)

## 2021-06-23 LAB — GLUCOSE, CAPILLARY
Glucose-Capillary: 135 mg/dL — ABNORMAL HIGH (ref 70–99)
Glucose-Capillary: 145 mg/dL — ABNORMAL HIGH (ref 70–99)
Glucose-Capillary: 139 mg/dL — ABNORMAL HIGH (ref 70–99)
Glucose-Capillary: 91 mg/dL (ref 70–99)
Glucose-Capillary: 104 mg/dL — ABNORMAL HIGH (ref 70–99)
Glucose-Capillary: 170 mg/dL — ABNORMAL HIGH (ref 70–99)

## 2021-06-23 LAB — BLOOD GAS, VENOUS
Acid-base deficit: 2.8 mmol/L — ABNORMAL HIGH (ref 0.0–2.0)
Bicarbonate: 23.2 mmol/L (ref 20.0–28.0)
O2 Saturation: 42.4 %
Patient temperature: 37
pCO2, Ven: 44 mmHg (ref 44.0–60.0)
pH, Ven: 7.33 (ref 7.250–7.430)
pO2, Ven: <31 mmHg — CL (ref 32.0–45.0)

## 2021-06-23 LAB — CBC
HCT: 36.9 % (ref 36.0–46.0)
Hemoglobin: 12.4 g/dL (ref 12.0–15.0)
MCH: 29.7 pg (ref 26.0–34.0)
MCHC: 33.6 g/dL (ref 30.0–36.0)
MCV: 88.5 fL (ref 80.0–100.0)
Platelets: 208 10*3/uL (ref 150–400)
RBC: 4.17 MIL/uL (ref 3.87–5.11)
RDW: 15.9 % — ABNORMAL HIGH (ref 11.5–15.5)
WBC: 17.8 10*3/uL — ABNORMAL HIGH (ref 4.0–10.5)
nRBC: 0 % (ref 0.0–0.2)

## 2021-06-23 LAB — CBG MONITORING, ED: Glucose-Capillary: 186 mg/dL — ABNORMAL HIGH (ref 70–99)

## 2021-06-23 LAB — CREATININE, SERUM
Creatinine, Ser: 0.86 mg/dL (ref 0.44–1.00)
GFR, Estimated: >60 mL/min (ref >60)

## 2021-06-23 LAB — PROCALCITONIN: Procalcitonin: 0.5 ng/mL

## 2021-06-23 LAB — BETA-HYDROXYBUTYRIC ACID: Beta-Hydroxybutyric Acid: 0.07 mmol/L (ref 0.05–0.27)

## 2021-06-23 LAB — CORTISOL-AM, BLOOD: Cortisol - AM: 18.7 ug/dL (ref 6.7–22.6)

## 2021-06-23 LAB — HEMOGLOBIN A1C
Hgb A1c MFr Bld: 7.3 % — ABNORMAL HIGH (ref 4.8–5.6)
Mean Plasma Glucose: 162.81 mg/dL

## 2021-06-23 LAB — HIV ANTIBODY (ROUTINE TESTING W REFLEX): HIV Screen 4th Generation wRfx: NONREACTIVE

## 2021-06-23 SURGERY — LOWER EXTREMITY ANGIOGRAPHY
Anesthesia: Moderate Sedation | Laterality: Left

## 2021-06-23 MED ORDER — GLUCERNA SHAKE PO LIQD
237.0000 mL | Freq: Three times a day (TID) | ORAL | Status: DC
  Administered 2021-06-24 – 2021-06-25 (×5): 237 mL via ORAL

## 2021-06-23 MED ORDER — MONTELUKAST SODIUM 10 MG PO TABS
10.0000 mg | ORAL_TABLET | Freq: Every day | ORAL | Status: DC
  Administered 2021-06-23 – 2021-06-27 (×5): 10 mg via ORAL
  Filled 2021-06-23 (×5): qty 1

## 2021-06-23 MED ORDER — SENNOSIDES-DOCUSATE SODIUM 8.6-50 MG PO TABS: 1.0000 | ORAL_TABLET | Freq: Every evening | ORAL | Status: DC | PRN

## 2021-06-23 MED ORDER — AMITRIPTYLINE HCL 25 MG PO TABS
100.0000 mg | ORAL_TABLET | Freq: Every day | ORAL | Status: DC
  Administered 2021-06-23 – 2021-06-27 (×5): 100 mg via ORAL
  Filled 2021-06-23 (×5): qty 4

## 2021-06-23 MED ORDER — ACETAMINOPHEN 325 MG PO TABS
650.0000 mg | ORAL_TABLET | ORAL | Status: DC | PRN
  Administered 2021-06-24: 650 mg via ORAL

## 2021-06-23 MED ORDER — HYDRALAZINE HCL 20 MG/ML IJ SOLN: 5.0000 mg | INTRAMUSCULAR | Status: DC | PRN

## 2021-06-23 MED ORDER — LACTATED RINGERS IV SOLN: INTRAVENOUS | Status: DC

## 2021-06-23 MED ORDER — ADULT MULTIVITAMIN W/MINERALS CH
1.0000 | ORAL_TABLET | Freq: Every day | ORAL | Status: DC
  Administered 2021-06-24 – 2021-06-28 (×5): 1 via ORAL
  Filled 2021-06-23 (×5): qty 1

## 2021-06-23 MED ORDER — IODIXANOL 320 MG/ML IV SOLN
INTRAVENOUS | Status: DC | PRN
  Administered 2021-06-23: 110 mL

## 2021-06-23 MED ORDER — ONDANSETRON HCL 4 MG PO TABS: 4.0000 mg | ORAL_TABLET | Freq: Four times a day (QID) | ORAL | Status: DC | PRN

## 2021-06-23 MED ORDER — MORPHINE SULFATE (PF) 4 MG/ML IV SOLN
2.0000 mg | INTRAVENOUS | Status: DC | PRN
  Administered 2021-06-23 (×2): 2 mg via INTRAVENOUS
  Filled 2021-06-23: qty 1

## 2021-06-23 MED ORDER — SODIUM CHLORIDE 0.9 % IV SOLN
2.0000 g | INTRAVENOUS | Status: DC
  Administered 2021-06-24 – 2021-06-27 (×4): 2 g via INTRAVENOUS
  Filled 2021-06-23 (×2): qty 20
  Filled 2021-06-23: qty 2
  Filled 2021-06-23 (×3): qty 20

## 2021-06-23 MED ORDER — MIDAZOLAM HCL 2 MG/2ML IJ SOLN
INTRAMUSCULAR | Status: AC
  Filled 2021-06-23: qty 2

## 2021-06-23 MED ORDER — HEPARIN SODIUM (PORCINE) 1000 UNIT/ML IJ SOLN
INTRAMUSCULAR | Status: DC | PRN
  Administered 2021-06-23: 4000 [IU] via INTRAVENOUS

## 2021-06-23 MED ORDER — ALBUTEROL SULFATE (2.5 MG/3ML) 0.083% IN NEBU: 2.5000 mg | INHALATION_SOLUTION | RESPIRATORY_TRACT | Status: DC | PRN

## 2021-06-23 MED ORDER — FENTANYL CITRATE (PF) 100 MCG/2ML IJ SOLN
INTRAMUSCULAR | Status: DC | PRN
  Administered 2021-06-23 (×4): 25 ug via INTRAVENOUS
  Administered 2021-06-23 (×2): 50 ug via INTRAVENOUS

## 2021-06-23 MED ORDER — HYDROCODONE-ACETAMINOPHEN 5-325 MG PO TABS
1.0000 | ORAL_TABLET | ORAL | Status: DC | PRN
  Administered 2021-06-23 – 2021-06-26 (×7): 2 via ORAL
  Filled 2021-06-23 (×7): qty 2

## 2021-06-23 MED ORDER — STROKE: EARLY STAGES OF RECOVERY BOOK: Freq: Once | Status: AC

## 2021-06-23 MED ORDER — SODIUM CHLORIDE 0.9% FLUSH: 3.0000 mL | INTRAVENOUS | Status: DC | PRN

## 2021-06-23 MED ORDER — ACETAMINOPHEN 650 MG RE SUPP: 650.0000 mg | Freq: Four times a day (QID) | RECTAL | Status: DC | PRN

## 2021-06-23 MED ORDER — SODIUM CHLORIDE 0.9 % IV SOLN: 250.0000 mL | INTRAVENOUS | Status: DC | PRN

## 2021-06-23 MED ORDER — INSULIN ASPART 100 UNIT/ML IJ SOLN
0.0000 [IU] | INTRAMUSCULAR | Status: DC
  Administered 2021-06-23 (×2): 2 [IU] via SUBCUTANEOUS
  Administered 2021-06-23: 3 [IU] via SUBCUTANEOUS
  Administered 2021-06-24: 2 [IU] via SUBCUTANEOUS
  Administered 2021-06-24: 3 [IU] via SUBCUTANEOUS
  Administered 2021-06-24: 5 [IU] via SUBCUTANEOUS
  Administered 2021-06-24 (×2): 2 [IU] via SUBCUTANEOUS
  Filled 2021-06-23 (×9): qty 1

## 2021-06-23 MED ORDER — LABETALOL HCL 5 MG/ML IV SOLN: 10.0000 mg | INTRAVENOUS | Status: DC | PRN

## 2021-06-23 MED ORDER — VANCOMYCIN HCL 750 MG/150ML IV SOLN
750.0000 mg | INTRAVENOUS | Status: DC
Start: 2021-06-23 — End: 2021-06-23
  Administered 2021-06-23: 750 mg via INTRAVENOUS
  Filled 2021-06-23: qty 150

## 2021-06-23 MED ORDER — FENTANYL CITRATE (PF) 100 MCG/2ML IJ SOLN
INTRAMUSCULAR | Status: AC
  Filled 2021-06-23: qty 2

## 2021-06-23 MED ORDER — BUSPIRONE HCL 5 MG PO TABS
5.0000 mg | ORAL_TABLET | Freq: Two times a day (BID) | ORAL | Status: DC
  Administered 2021-06-23 – 2021-06-28 (×10): 5 mg via ORAL
  Filled 2021-06-23 (×12): qty 1

## 2021-06-23 MED ORDER — HEPARIN SODIUM (PORCINE) 1000 UNIT/ML IJ SOLN
INTRAMUSCULAR | Status: AC
  Filled 2021-06-23: qty 1

## 2021-06-23 MED ORDER — NICOTINE 21 MG/24HR TD PT24
21.0000 mg | MEDICATED_PATCH | Freq: Every day | TRANSDERMAL | Status: DC
  Administered 2021-06-23 – 2021-06-28 (×6): 21 mg via TRANSDERMAL
  Filled 2021-06-23 (×6): qty 1

## 2021-06-23 MED ORDER — ONDANSETRON HCL 4 MG/2ML IJ SOLN: 4.0000 mg | Freq: Four times a day (QID) | INTRAMUSCULAR | Status: DC | PRN

## 2021-06-23 MED ORDER — PANTOPRAZOLE SODIUM 40 MG PO TBEC
40.0000 mg | DELAYED_RELEASE_TABLET | Freq: Every day | ORAL | Status: DC
  Administered 2021-06-24 – 2021-06-28 (×5): 40 mg via ORAL
  Filled 2021-06-23 (×5): qty 1

## 2021-06-23 MED ORDER — SODIUM CHLORIDE 0.9 % IV SOLN
INTRAVENOUS | Status: AC | PRN
  Administered 2021-06-23: 75 mL/h via INTRAVENOUS

## 2021-06-23 MED ORDER — ATORVASTATIN CALCIUM 80 MG PO TABS
80.0000 mg | ORAL_TABLET | Freq: Every evening | ORAL | Status: DC
  Administered 2021-06-24 – 2021-06-27 (×4): 80 mg via ORAL
  Filled 2021-06-23 (×4): qty 1

## 2021-06-23 MED ORDER — HYDRALAZINE HCL 20 MG/ML IJ SOLN
INTRAMUSCULAR | Status: AC
  Filled 2021-06-23: qty 1

## 2021-06-23 MED ORDER — MORPHINE SULFATE (PF) 2 MG/ML IV SOLN
INTRAVENOUS | Status: AC
  Administered 2021-06-23: 2 mg via INTRAVENOUS
  Filled 2021-06-23: qty 2

## 2021-06-23 MED ORDER — SODIUM CHLORIDE 0.9% FLUSH
3.0000 mL | Freq: Two times a day (BID) | INTRAVENOUS | Status: DC
  Administered 2021-06-23 – 2021-06-28 (×8): 3 mL via INTRAVENOUS

## 2021-06-23 MED ORDER — ACETAMINOPHEN 325 MG PO TABS
650.0000 mg | ORAL_TABLET | Freq: Four times a day (QID) | ORAL | Status: DC | PRN
  Filled 2021-06-23: qty 2

## 2021-06-23 MED ORDER — METRONIDAZOLE 500 MG/100ML IV SOLN
500.0000 mg | Freq: Three times a day (TID) | INTRAVENOUS | Status: DC
  Administered 2021-06-23 – 2021-06-24 (×4): 500 mg via INTRAVENOUS
  Filled 2021-06-23 (×6): qty 100

## 2021-06-23 MED ORDER — MIDAZOLAM HCL 2 MG/2ML IJ SOLN
INTRAMUSCULAR | Status: DC | PRN
  Administered 2021-06-23: 0.5 mg via INTRAVENOUS
  Administered 2021-06-23: 1 mg
  Administered 2021-06-23: 2 mg via INTRAVENOUS
  Administered 2021-06-23: 0.5 mg
  Administered 2021-06-23: 0.5 mg via INTRAVENOUS
  Administered 2021-06-23: 1 mg via INTRAVENOUS

## 2021-06-23 MED ORDER — SODIUM CHLORIDE 0.9 % IV SOLN: 2.0000 g | Freq: Two times a day (BID) | INTRAVENOUS | Status: DC

## 2021-06-23 MED ORDER — SODIUM CHLORIDE 0.9 % IV SOLN: INTRAVENOUS | Status: AC

## 2021-06-23 MED ORDER — LABETALOL HCL 5 MG/ML IV SOLN: 10.0000 mg | Freq: Four times a day (QID) | INTRAVENOUS | Status: DC | PRN

## 2021-06-23 MED ORDER — HEPARIN (PORCINE) 25000 UT/250ML-% IV SOLN
1100.0000 [IU]/h | INTRAVENOUS | Status: DC
  Administered 2021-06-24: 1100 [IU]/h via INTRAVENOUS
  Filled 2021-06-23: qty 250

## 2021-06-23 MED ORDER — OXYCODONE HCL 5 MG PO TABS
5.0000 mg | ORAL_TABLET | ORAL | Status: DC | PRN
  Administered 2021-06-23: 10 mg via ORAL

## 2021-06-23 MED ORDER — OXYCODONE HCL 5 MG PO TABS
ORAL_TABLET | ORAL | Status: AC
  Filled 2021-06-23: qty 2

## 2021-06-23 MED ORDER — PROSOURCE PLUS PO LIQD
30.0000 mL | Freq: Three times a day (TID) | ORAL | Status: DC
  Administered 2021-06-24 – 2021-06-28 (×5): 30 mL via ORAL
  Filled 2021-06-23 (×17): qty 30

## 2021-06-23 MED ORDER — SERTRALINE HCL 50 MG PO TABS
100.0000 mg | ORAL_TABLET | Freq: Every day | ORAL | Status: DC
  Administered 2021-06-24 – 2021-06-28 (×5): 100 mg via ORAL
  Filled 2021-06-23 (×5): qty 2

## 2021-06-23 SURGICAL SUPPLY — 33 items
BALLN LUTONIX DCB 7X60X130 (BALLOONS) ×2
BALLN ULTRVRSE 7X60X75C (BALLOONS) ×2
BALLOON LUTONIX DCB 7X60X130 (BALLOONS) IMPLANT
BALLOON ULTRVRSE 7X60X75C (BALLOONS) IMPLANT
CATH BEACON 5 .035 40 KMP TP (CATHETERS) IMPLANT
CATH BEACON 5 .035 65 KMP TIP (CATHETERS) ×1 IMPLANT
CATH BEACON 5 .038 40 KMP TP (CATHETERS) ×2
CATH PIG 70CM (CATHETERS) ×1 IMPLANT
CATH VS15FR (CATHETERS) ×1 IMPLANT
COVER PROBE U/S 5X48 (MISCELLANEOUS) ×1 IMPLANT
DEVICE SAFEGUARD 24CM (GAUZE/BANDAGES/DRESSINGS) ×2 IMPLANT
DEVICE STARCLOSE SE CLOSURE (Vascular Products) ×2 IMPLANT
DEVICE TORQUE .025-.038 (MISCELLANEOUS) ×1 IMPLANT
GLIDEWIRE STIFF .35X180X3 HYDR (WIRE) ×1 IMPLANT
INTRODUCER 7FR 23CM (INTRODUCER) ×1 IMPLANT
KIT ENCORE 26 ADVANTAGE (KITS) ×2 IMPLANT
NDL ENTRY 21GA 7CM ECHOTIP (NEEDLE) IMPLANT
NEEDLE ENTRY 21GA 7CM ECHOTIP (NEEDLE) ×2 IMPLANT
PACK ANGIOGRAPHY (CUSTOM PROCEDURE TRAY) ×3 IMPLANT
SET INTRO CAPELLA COAXIAL (SET/KITS/TRAYS/PACK) ×1 IMPLANT
SHEATH ANL2 6FRX45 HC (SHEATH) ×1 IMPLANT
SHEATH BRITE TIP 5FRX11 (SHEATH) ×1 IMPLANT
SHEATH BRITE TIP 6FR X 23 (SHEATH) ×1 IMPLANT
SHEATH RAABE 7FR (SHEATH) ×1 IMPLANT
STENT LIFESTREAM 6X16X80 (Permanent Stent) ×2 IMPLANT
STENT LIFESTREAM 6X37X80 (Permanent Stent) ×2 IMPLANT
STENT LIFESTREAM 7X16X80 (Permanent Stent) ×1 IMPLANT
STENT LIFESTREAM 9X58X80 (Permanent Stent) ×1 IMPLANT
SYR MEDRAD MARK 7 150ML (SYRINGE) ×1 IMPLANT
TUBING CONTRAST HIGH PRESS 72 (TUBING) ×1 IMPLANT
WIRE AMPLATZ SSTIFF .035X260CM (WIRE) ×1 IMPLANT
WIRE GUIDERIGHT .035X150 (WIRE) ×1 IMPLANT
WIRE MAGIC TOR.035 180C (WIRE) ×1 IMPLANT

## 2021-06-23 NOTE — Progress Notes (Signed)
Pt returned from special services (cath lab); with RN.  Pt was AO x 3, reporting 10/10 pain in groin (bilateral femoral cath insertion sites.  Pressure dressing in place, clean/dry/intact.  Will continue to monitor

## 2021-06-23 NOTE — Progress Notes (Signed)
SLP Cancellation Note  Patient Details Name: Autumn Arnold MRN: [id number] DOB: July 27, 1951   Cancelled treatment:       Reason Eval/Treat Not Completed: SLP screened, no needs identified, will sign off  Everlynn Sagun B. Rutherford Nail M.S., CCC-SLP, [place] Office [phone]  Quintina Hakeem Rutherford Nail 06/23/2021, 10:06 AM

## 2021-06-23 NOTE — Consult Note (Signed)
ANTICOAGULATION CONSULT NOTE  Pharmacy Consult for IV Heparin Indication:  PAD  / limb ischemia  Patient Measurements: Heparin Dosing Weight: 56.9 kg  Labs: Recent Labs    06/22/21 1917 06/22/21 2051 06/22/21 2145 06/22/21 2357 06/23/21 0456 06/23/21 1208  HGB 14.1  --   --   --  12.4  --   HCT 43.5  --   --   --  36.9  --   PLT 307  --   --   --  208  --   APTT  --  23*  --   --   --   --   LABPROT  --  12.8  --   --   --   --   INR  --  1.0  --   --   --   --   HEPARINUNFRC  --   --   --   --  0.17* 0.23*  CREATININE 0.99  --   --  0.92 0.86  --   TROPONINIHS 6  --  8  --   --   --      Estimated Creatinine Clearance: 50.7 mL/min (by C-G formula based on SCr of 0.86 mg/dL).   Medical History: Past Medical History:  Diagnosis Date   Anxiety    Arthritis    Asthma    Bilateral lower extremity edema    COPD (chronic obstructive pulmonary disease) ([place])    Cough    CHRONIC   Diabetes mellitus 2008   Edema    LEGS/FEET   GERD (gastroesophageal reflux disease)    Hyperlipidemia    Hypertension    Shortness of breath dyspnea    DOE   Sleep apnea    [facility]AP at [facility], Dr. Humphrey Rolls   Stroke [facility]) 02/2010   headache, left arm numbness   Wheezing     Medications:  No anticoagulation prior to admission per my chart review. Medication reconciliation is pending  Assessment: Patient is a 70 y/o F with medical history as above who presented to the ED with syncopal episode while donating plasma. She is also endorsing leg pain and numbness. Lactate level elevated. Imaging with "severe atherosclerotic plaque with several areas of focal moderate to severe stenosis of the infrarenal abdominal aorta as well as bilateral common iliac arteries". Pharmacy has been consulted to initiate heparin infusion for PAD / limb ischemia.  Baseline CBC acceptable. Baseline aPTT 23s and INR 1.  0819 0456 HL 0.17, subtherapeutic; 950 un/hr 0819 1208 HL 0.23, subtherapeutic; 1050 un/hr   Goal of  Therapy:  Heparin level 0.3 - 0.5 units/ml (lower goal per discussion with MD given hematochezia) Monitor platelets by anticoagulation protocol: Yes   Plan:  Heparin level is subtherapeutic Increase heparin infusion to 1110 units/hr, no bolus Will check HL 6 hours after rate change Daily CBC per protocol while on IV heparin Continue to monitor closely for further rectal bleeding  Pattie Flaharty O Marcey Persad 06/23/2021,12:57 PM

## 2021-06-23 NOTE — Progress Notes (Signed)
Initial Nutrition Assessment  DOCUMENTATION CODES:  Not applicable  INTERVENTION:  Advance diet as soon as medically able.  Recommend SLP consult to determine safety of texture and liquid consistency given stroke.  Add Glucerna Shake po TID, each supplement provides 220 kcal and 10 grams of protein.  Add 30 ml ProSource Plus po TID, each supplement provides 100 kcal and 15 grams of protein.    Add MVI with minerals daily.  NUTRITION DIAGNOSIS:  Increased nutrient needs related to acute illness (stroke and SIRS) as evidenced by estimated needs.  GOAL:  Patient will meet greater than or equal to 90% of their needs  MONITOR:  Diet advancement, PO intake, Supplement acceptance, Labs, Weight trends, I & O's  REASON FOR ASSESSMENT:  Malnutrition Screening Tool    ASSESSMENT:  70 yo female with a PMH of DM, nicotine dependence, anxiety, OSA, mild right lower extremity weakness from old stroke, brought in by EMS from the plasma center where she had a syncopal episode after donating plasma. Admitted with severe atherosclerosis of the mesentery, aorta and lower extremities, acute stroke seen on MRI of the brain, and SIRS.  Spoke with pt at bedside. Pt reports that she usually eats 1 meal per day at home (around supper time) and drinks water/chews ice the rest of the day. It is very unlikely that pt meets all of her estimated needs.   Discussed the importance of eating throughout the day for steady energy and blood sugar control.  No recent weight history in Epic. Per Care Everywhere, pt weighed 65.2 kg on 04/11/21 and on admission, pt weighed 63.5 kg. Pt has lost ~4 lbs (2.7%) in a little over 2 months, which is not necessarily significant for the time frame.  On exam, pt with few mild depletions, but otherwise insignificant.  Recommend advancing diet as soon as medically able. Recommend adding Glucerna shakes TID, ProSource TID, and MVI with minerals daily.  Medications: reviewed; SSI,  Protonix, LR @ 50 ml/hr, Vicodin PRN (given once today)  Labs: reviewed; Na 132 (L), 132-186 (H) HbA1c: 7.3% (06/23/2021)  NUTRITION - FOCUSED PHYSICAL EXAM: Flowsheet Row Most Recent Value  Orbital Region Mild depletion  Upper Arm Region No depletion  Thoracic and Lumbar Region No depletion  Buccal Region Mild depletion  Temple Region Mild depletion  Clavicle Bone Region No depletion  Clavicle and Acromion Bone Region No depletion  Scapular Bone Region No depletion  Dorsal Hand Mild depletion  Patellar Region No depletion  Anterior Thigh Region No depletion  Posterior Calf Region No depletion  Edema (RD Assessment) None  Hair Reviewed  Eyes Reviewed  Mouth Reviewed  Skin Reviewed  Nails Reviewed   Diet Order:   Diet Order             Diet NPO time specified  Diet effective now                  EDUCATION NEEDS:  Education needs have been addressed  Skin:  Skin Assessment: Reviewed RN Assessment  Last BM:  06/23/21 - Type 5, brown/bloody, medium  Height:  Ht Readings from Last 1 Encounters:  06/23/21 '4\' 11"'$  (1.499 m)   Weight:  Wt Readings from Last 1 Encounters:  06/22/21 63.5 kg   BMI:  Body mass index is 28.28 kg/m.  Estimated Nutritional Needs:  Kcal:  [id number] Protein:  75-90 grams Fluid:  >1.75 L  Derrel Nip, RD, LDN (she/her/hers) Registered Dietitian I After-Hours/Weekend Pager # in [phone]

## 2021-06-23 NOTE — Op Note (Signed)
[facility]  Percutaneous Study/Intervention Procedural Note   Date of Surgery: 06/23/2021  Surgeon:Chavela Justiniano, Dolores Lory   Pre-operative Diagnosis: Acute mesenteric ischemia; atherosclerotic occlusive disease of the infrarenal aorta; atherosclerotic occlusive disease bilateral lower extremities with ischemia and rest pain of the left foot  Post-operative diagnosis:  Same  Procedure(s) Performed:  1.  Abdominal aortogram  2.  Introduction catheter into SMA             3.  Percutaneous transluminal angioplasty and stent placement with a 7 x 16 lifestream stent superior mesenteric artery.             4.  Percutaneous transluminal angioplasty and stent placement using a 9 mm x 58 mm lifestream stent mid infrarenal aorta  5.  Percutaneous transluminal angioplasty and stent placement right common iliac artery with 6 mm lifestream stents postdilated to 7 mm; "kissing balloon" technique  4.  Percutaneous transluminal and plasty and stent placement left common iliac arterywith 6 mm lifestream stents postdilated to 7 mm; "kissing balloon" technique  5.  Ultrasound guided access bilateral common femoral arteries  6.  StarClose closure device bilateral common femoral arteries  Anesthesia: Conscious sedation was administered under my direct supervision by the interventional radiology RN. IV Versed plus fentanyl were utilized. Continuous ECG, pulse oximetry and blood pressure was monitored throughout the entire procedure. Conscious sedation was for a total of 80 minutes.  Sheath: 7 [place] Rabie sheath right common femoral artery retrograde; 6 French 25 cm Pinnacle sheath left common femoral artery retrograde  Contrast: 110 cc  Fluoroscopy Time: 11.0 minutes  Indications: Patient presented with multiple problems as noted in the H&P.  Work-up has indicated a critical lesion within the SMA and signs and symptoms consistent with mesenteric ischemia.  She also has signs and symptoms  consistent with rest pain and ischemia of the left lower extremity.  She is undergoing angiography with the hope for intervention for preventing fatal mesenteric ischemia as well as loss of limb.  Procedure:  Autumn Arnold a 70 y.o. female who was identified and appropriate procedural time out was performed.  The patient was then placed supine on the table and prepped and draped in the usual sterile fashion.  Ultrasound was used to evaluate the right common femoral artery.  It was echolucent and pulsatile indicating it is patent .  An ultrasound image was acquired for the permanent record.  A micropuncture needle was used to access the right common femoral artery under direct ultrasound guidance.  The microwire was then advanced under fluoroscopic guidance without difficulty followed by the micro-sheath  A 0.035 J wire was advanced without resistance and a 5Fr sheath was placed.  Stiff angled Glidewire and Kumpe catheter were required to negotiate the mid infrarenal aortic stenosis and once accomplished the Kumpe catheter was exchanged for a pigtail.  The pigtail catheter was then positioned at the level of T12 and an AP image of the aorta was obtained. After review the images we transitioned to a true lateral.  Lateral projection of the visceral aorta was then obtained.  This demonstrated the stenosis noted on the CT angiogram.  Using of VS 1 catheter and a stiff angled Glidewire in association with the  6 [place] Ansell sheath I was able to select the SMA and advance the sheath into the ostia.  I was able to then advance a Kumpe catheter and exchanged the Glidewire for an Amplatz wire.  AP as well as oblique views  of the SMA were performed which demonstrated the greater than 80% stenosis within the SMA essentially at its origin.  4000 units of heparin was given and allowed to circulate for approximately 4 minutes.  I then selected a 7 mm x 16 mm lifestream stent advanced across the origin of the SMA  and inflated the balloon to 12 atm.  Follow-up imaging in both oblique and lateral demonstrated the SMA was now widely patent with less than 10% residual stenosis.  There was preservation of the distal runoff.  At this point the sheath was removed and the Amplatz wire pulled back out of the SMA and advanced into the descending aorta.  We then turned our attention to the greater than 90% lesion within the mid infrarenal aorta.  Magnified imaging was obtained.  I selected a 9 mm x 58 mm lifestream stent which required a 7 French sheath and therefore I exchanged the sheath for a 7 [place] which was then positioned across the lesion.  The 9 mm x 58 mm lifestream stent was then advanced across the lesion taking care to preserve the renals as well as avoid jailing the aortic bifurcation.  The sheath was then pulled back and the stent deployed inflation was to 12 atm for approximately 1 minute.  Follow-up imaging now demonstrated wide patency of the aorta a dramatic improvement with less than 10% residual stenosis.  After review the aortic image the ultrasound was reprepped and delivered back onto the sterile field. The left common femoral was then imaged with the ultrasound it was noted to be echolucent and pulsatile indicating patency. Images recorded for the permanent record. Under real-time visualization a microneedle was inserted into the anterior wall the common femoral artery microwire was then advanced without difficulty under fluoroscopic guidance followed by placement of the micro-sheath.  A Magic torque wire was then negotiated under fluoroscopic guidance into the aorta. 6 French 25 cm Pinnacle sheath was then placed.  Magnified images of the aortic bifurcation were then made using hand injection contrast from the femoral sheaths. After appropriate sizing a 6 mm x 37 mm lifestream stent stent was selected for the right and a 6 mm x 37 mm lifestream stent stent was selected for the left. There were  then advanced and positioned just above the aortic bifurcation. Insufflation for full expansion of the stents was performed simultaneously. Follow-up imaging was then performed and the stents had moved distally with deployment and there was still ostial stenosis and therefore a 6 mm x 60 mm lifestream stent was advanced up both the right and left sides and again deployed simultaneously elevating the aortic bifurcation approximately 1 cm into the aorta.  Inflations were to 12 atm for approximately 1 minute.  Distally the stents appeared slightly undersized secondary to poststenotic dilatation and 7 mm x 60 mm balloons were advanced up both sides simultaneously and inflated to 6 atm for 1 minute..  Follow-up imaging was now performed which demonstrated wide patency of the aortic bifurcation with an excellent reconstruction of the distal aorta and common iliac arteries bilaterally there is now less than 10% residual stenosis in both common iliac arteries.  External iliac arteries are widely patent.  Oblique views were then obtained of the groins in succession and Star close device is deployed without difficulty. There were no immediate complications   Findings:   Aortogram:  The abdominal aorta is opacified with a bolus injection contrast in both the AP and lateral projections.  Findings demonstrate a  greater than 80% stenosis at the origin of the SMA.  Bilateral renal arteries are widely patent.  In the mid infrarenal aorta there is a greater than 90% stenosis.  There is hemodynamically significant distal aortic disease with a greater than 80% stenosis in the common iliac on the right and approximately 70% iliac stenosis on the left.  Distal common iliacs are widely patent.  Internal iliacs and external iliac arteries are widely patent bilaterally.  The right common femoral demonstrates mild disease in the origins of the SFA and profunda are patent the left common femoral demonstrates moderate disease in the  origins of the SFA and profunda are patent.  Following angioplasty and stent placement within the SFA there is less than 10% residual stenosis.  Following angioplasty and stent placement with the mid aorta there is now less than 10% residual stenosis.   Following placement of the iliac stents there is now wide patency with less than 10% residual stenosis with rapid flow through the aortic bifurcation bilaterally.  Summary:  Successful reconstruction of the distal aorta and bilateral iliac arteries  Disposition: Patient was taken to the recovery room in stable condition having tolerated the procedure well.  Autumn Arnold 06/23/2021,6:42 PM

## 2021-06-23 NOTE — TOC Initial Note (Signed)
Transition of Care [facility]) - Initial/Assessment Note    Patient Details  Name: Autumn Arnold MRN: [id number] Date of Birth: 1951-09-10  Transition [facility]) CM/SW Contact:    Magnus Ivan, LCSW Phone Number: 06/23/2021, 2:20 PM  Clinical Narrative:            CSW spoke with patient regarding discharge planning. Patient lives with spouse and stepson. PCP is Dr. Netty Starring. Patient has a RW at home. Patient had [place] in the past, not sure which agency she used. Patient typically drives herself to appointments. Patient is agreeable to [facility] recommendation, denies agency preference at this time. Referral made to [facility] with [facility]        Expected Discharge Plan: [place] Barriers to Discharge: Continued Medical Work up   Patient Goals and CMS Choice Patient states their goals for this hospitalization and ongoing recovery are:: home with home health CMS Medicare.gov Compare Post Acute Care list provided to:: Patient Choice offered to / list presented to : Patient  Expected Discharge Plan and Services Expected Discharge Plan: [place]       Living arrangements for the past 2 months: Single Family Home                                      Prior Living Arrangements/Services Living arrangements for the past 2 months: Single Family Home Lives with:: Relatives, Spouse Patient language and need for interpreter reviewed:: Yes Do you feel safe going back to the place where you live?: Yes      Need for Family Participation in Patient Care: Yes (Comment) Care giver support system in place?: Yes (comment) Current home services: DME Criminal Activity/Legal Involvement Pertinent to Current Situation/Hospitalization: No - Comment as needed  Activities of Daily Living Home Assistive Devices/Equipment: Cane (specify quad or straight) ADL Screening (condition at time of admission) Patient's cognitive ability adequate to safely complete daily  activities?: Yes Is the patient deaf or have difficulty hearing?: No Does the patient have difficulty seeing, even when wearing glasses/contacts?: No Does the patient have difficulty concentrating, remembering, or making decisions?: No Patient able to express need for assistance with ADLs?: Yes Does the patient have difficulty dressing or bathing?: No Independently performs ADLs?: Yes (appropriate for developmental age) Does the patient have difficulty walking or climbing stairs?: Yes Weakness of Legs: None Weakness of Arms/Hands: None  Permission Sought/Granted Permission sought to share information with : Chartered certified accountant granted to share information with : Yes, Verbal Permission Granted     Permission granted to share info w AGENCY: [place]        Emotional Assessment       Orientation: : Oriented to Self, Oriented to Place, Oriented to  Time, Oriented to Situation Alcohol / Substance Use: Not Applicable Psych Involvement: No (comment)  Admission diagnosis:  Syncope and collapse [R55] Syncope [R55] Lower limb ischemia [I99.8] Critical lower limb ischemia (HCC) [I70.229] Sepsis without acute organ dysfunction, due to unspecified organism [facility]) [A41.9] Patient Active Problem List   Diagnosis Date Noted   Sepsis ([place]) 06/23/2021   SIRS (systemic inflammatory response syndrome) (HCC)    Rectal bleeding    Tobacco abuse    Anxiety and depression 06/22/2021   Hemiparesis affecting right side as late effect of stroke ([place]) 06/22/2021   OSA (obstructive sleep apnea) 06/22/2021   Type 2 diabetes mellitus  without complication, without long-term current use of insulin ([place]) 06/22/2021   Syncope and collapse 06/22/2021   Critical lower limb ischemia ([place]) 06/22/2021   Hyperglycemia due to type 2 diabetes mellitus ([place]) 06/22/2021   Lactic acidosis 06/22/2021   Tobacco dependence 10/18/2017   Acute stroke due to ischemia ([place]) 11/07/2015   PCP:  [name], MD Pharmacy[facility]   [id number] [address]: [phone]: [phone]    Social Determinants of Health (SDOH) Interventions    Readmission Risk Interventions Readmission Risk Prevention Plan 06/23/2021  Post Dischage Appt Complete  Medication Screening Complete  Transportation Screening Complete  Some recent data might be hidden

## 2021-06-23 NOTE — Consult Note (Signed)
Autumn Arnold and Autumn Arnold   MRN : [id number]  Autumn Arnold is a 70 y.o. (1951-09-29) female who presents with chief complaint of left foot pain.  History of Present Illness:   I am asked to evaluate the patient by Dr. Imagene Riches.  The patient presented to the emergency room with multiple symptoms.  Apparently she had an episode of syncope ultimately resulting in her being brought in by EMS.  Once in the emergency room she was complaining of significant left lower extremity pain.  Physical examination suggest atherosclerotic occlusive disease with rest pain given her cool foot paleness and slow capillary refill his leg as well as lack of pedal pulses.  As an associated symptom she also had a markedly elevated white count as well as marked acidosis.  She also notes she has been having some diarrhea that may have been blood-tinged.  On questioning her today she admits that she does not really eat much not really sure why and she has been losing weight.  And then toward the end of the conversation states it "is that why my stomach is always hurting".  CT angiogram was obtained which I personally reviewed and it clearly shows a 90% stenosis of the SMA as well as a 90% stenosis of the infrarenal abdominal aortic aneurysm in association with greater than 70% bilateral common iliac artery stenosis.  Current Meds  Medication Sig   busPIRone (BUSPAR) 5 MG tablet Take 5 mg by mouth 2 (two) times daily.    Past Medical History:  Diagnosis Date   Anxiety    Arthritis    Asthma    Bilateral lower extremity edema    COPD (chronic obstructive pulmonary disease) (HCC)    Cough    CHRONIC   Diabetes mellitus 2008   Edema    LEGS/FEET   GERD (gastroesophageal reflux disease)    Hyperlipidemia    Hypertension    Shortness of breath dyspnea    DOE   Sleep apnea    CPAP at [facility], Dr. Humphrey Rolls   Stroke [facility]) 02/2010   headache, left arm numbness   Wheezing     Past Surgical History:   Procedure Laterality Date   ABDOMINAL HYSTERECTOMY     ANTERIOR FUSION CERVICAL SPINE     CARPAL TUNNEL RELEASE     Bilateral   CATARACT EXTRACTION W/PHACO Left 03/12/2016   Procedure: CATARACT EXTRACTION PHACO AND INTRAOCULAR LENS PLACEMENT ([place]);  Surgeon: Estill Cotta, MD;  Location: [facility] ORS;  Service: Ophthalmology;  Laterality: Left;  [place] 01:12 AP% 23.4 CDE 29.83 fluid pack lot # [id number]   CATARACT EXTRACTION W/PHACO Right 04/16/2016   Procedure: CATARACT EXTRACTION PHACO AND INTRAOCULAR LENS PLACEMENT ([facility]);  Surgeon: Estill Cotta, MD;  Location: [facility] ORS;  Service: Ophthalmology;  Laterality: Right;  [place] 01:17 AP% 22.5 CDE 35.08 Fluid pack lot # [id number]   CESAREAN SECTION     COLONOSCOPY WITH PROPOFOL N/A 08/12/2018   Procedure: COLONOSCOPY WITH PROPOFOL;  Surgeon: Toledo, Benay Pike, MD;  Location: [facility] ENDOSCOPY;  Service: Gastroenterology;  Laterality: N/A;   ELBOW SURGERY     Tendonitis   ESOPHAGOGASTRODUODENOSCOPY (EGD) WITH PROPOFOL N/A 08/12/2018   Procedure: ESOPHAGOGASTRODUODENOSCOPY (EGD) WITH PROPOFOL;  Surgeon: Toledo, Benay Pike, MD;  Location: [facility] ENDOSCOPY;  Service: Gastroenterology;  Laterality: N/A;   INCONTINENCE SURGERY     TONSILLECTOMY      Social History Social History   Tobacco Use   Smoking status: Every Day    Packs/day: 1.00  Types: Cigarettes   Smokeless tobacco: Never  Substance Use Topics   Alcohol use: Yes    Alcohol/week: 7.0 standard drinks    Types: 7 Cans of beer per week   Drug use: No    Family History Family History  Problem Relation Age of Onset   CAD Mother    Hypertension Mother    Arthritis Mother    Diabetes Father    CAD Father     Allergies  Allergen Reactions   Lisinopril      REVIEW OF SYSTEMS (Negative unless checked)  Constitutional: '[]'$ Weight loss  '[]'$ Fever  '[]'$ Chills Cardiac: '[]'$ Chest pain   '[]'$ Chest pressure   '[]'$ Palpitations   '[]'$ Shortness of breath when laying flat   '[]'$ Shortness of breath with  exertion. Autumn:  '[]'$ Pain in legs with walking   '[]'$ Pain in legs at rest  '[]'$ History of DVT   '[]'$ Phlebitis   '[]'$ Swelling in legs   '[]'$ Varicose veins   '[]'$ Non-healing ulcers Pulmonary:   '[]'$ Uses home oxygen   '[]'$ Productive cough   '[]'$ Hemoptysis   '[]'$ Wheeze  '[]'$ COPD   '[]'$ Asthma Neurologic:  '[]'$ Dizziness   '[]'$ Seizures   '[]'$ History of stroke   '[]'$ History of TIA  '[]'$ Aphasia   '[]'$ Vissual changes   '[]'$ Weakness or numbness in arm   '[]'$ Weakness or numbness in leg Musculoskeletal:   '[]'$ Joint swelling   '[]'$ Joint pain   '[]'$ Low back pain Hematologic:  '[]'$ Easy bruising  '[]'$ Easy bleeding   '[]'$ Hypercoagulable state   '[]'$ Anemic Gastrointestinal:  '[]'$ Diarrhea   '[]'$ Vomiting  '[]'$ Gastroesophageal reflux/heartburn   '[]'$ Difficulty swallowing. Genitourinary:  '[]'$ Chronic kidney disease   '[]'$ Difficult urination  '[]'$ Frequent urination   '[]'$ Blood in urine Skin:  '[]'$ Rashes   '[]'$ Ulcers  Psychological:  '[]'$ History of anxiety   '[]'$  History of major depression.  Physical Examination  Vitals:   06/23/21 0832 06/23/21 1116 06/23/21 1530 06/23/21 1553  BP: (!) 165/63 (!) 150/82 (!) 160/71 (!) 162/128  Pulse: (!) 102 (!) 104 61   Resp: '15 16 17 19  '$ Temp: 98.2 F (36.8 C) 97.8 F (36.6 C) 98.5 F (36.9 C) 98.7 F (37.1 C)  TempSrc: Oral Oral  Oral  SpO2: 97% 100% (!) 79% 96%  Weight:      Height:       Body mass index is 28.28 kg/m. Gen: WD/WN, NAD Head: [place]/AT, No temporalis wasting.  Ear/Nose/Throat: Hearing grossly intact, nares w/o erythema or drainage Eyes: PER, EOMI, sclera nonicteric.  Neck: Supple, no masses.  No bruit or JVD.  Pulmonary:  Good air movement, no audible wheezing, no use of accessory muscles.  Cardiac: RRR, normal S1, S2, no Murmurs. Autumn:   Left foot is cool to the touch with slow capillary refill and it is pale Vessel Right Left  Radial Palpable Palpable  PT Not palpable Not palpable  DP Not palpable Not palpable  Gastrointestinal: soft, mildly distended.  Mild guarding/ peritoneal signs.  Musculoskeletal: M/S 5/5  throughout.  No visible deformity.  Neurologic: CN 2-12 intact. Pain and light touch intact in extremities.  Symmetrical.  Speech is fluent. Motor exam as listed above. Psychiatric: Judgment intact, Mood & affect appropriate for pt's clinical situation. Dermatologic: No rashes or ulcers noted.  No changes consistent with cellulitis.   CBC Lab Results  Component Value Date   WBC 17.8 (H) 06/23/2021   HGB 12.4 06/23/2021   HCT 36.9 06/23/2021   MCV 88.5 06/23/2021   PLT 208 06/23/2021    BMET    Component Value Date/Time   NA 132 (L) 06/22/2021 2357  K 3.9 06/22/2021 2357   CL 98 06/22/2021 2357   CO2 20 (L) 06/22/2021 2357   GLUCOSE 245 (H) 06/22/2021 2357   BUN 12 06/22/2021 2357   CREATININE 0.86 06/23/2021 0456   CALCIUM 8.3 (L) 06/22/2021 2357   GFRNONAA >60 06/23/2021 0456   GFRAA >60 11/08/2015 0419   Estimated Creatinine Clearance: 50.7 mL/min (by C-G formula based on SCr of 0.86 mg/dL).  COAG Lab Results  Component Value Date   INR 1.0 06/22/2021   INR 0.89 11/07/2015    Radiology CT HEAD WO CONTRAST  Result Date: 06/22/2021 CLINICAL DATA:  Syncope. EXAM: CT HEAD WITHOUT CONTRAST TECHNIQUE: Contiguous axial images were obtained from the base of the skull through the vertex without intravenous contrast. COMPARISON:  November 07, 2015 FINDINGS: Brain: There is mild cerebral atrophy with widening of the extra-axial spaces and ventricular dilatation. There are areas of decreased attenuation within the white matter tracts of the supratentorial brain, consistent with microvascular disease changes. Small chronic bilateral para thalamic lacunar infarcts are seen. Autumn: No hyperdense vessel or unexpected calcification. Skull: Normal. Negative for fracture or focal lesion. Sinuses/Orbits: No acute finding. Other: None. IMPRESSION: 1. No acute intracranial abnormality. 2. Generalized cerebral atrophy with chronic microvascular white matter disease. Electronically Signed   By:  Virgina Norfolk M.D.   On: 06/22/2021 19:40   MR BRAIN WO CONTRAST  Result Date: 06/23/2021 CLINICAL DATA:  Loss of consciousness with leg pain and numbness EXAM: MRI HEAD WITHOUT CONTRAST TECHNIQUE: Multiplanar, multiecho pulse sequences of the brain and surrounding structures were obtained without intravenous contrast. COMPARISON:  None. FINDINGS: Brain: Small foci abnormal diffusion restriction within the superior left frontal and parietal lobes. No acute or chronic hemorrhage. There is multifocal hyperintense T2-weighted signal within the white matter. Generalized volume loss without a clear lobar predilection. The midline structures are normal. Autumn: Major flow voids are preserved. Skull and upper cervical spine: Normal calvarium and skull base. Visualized upper cervical spine and soft tissues are normal. Sinuses/Orbits:No paranasal sinus fluid levels or advanced mucosal thickening. No mastoid or middle ear effusion. Normal orbits. IMPRESSION: 1. Small foci of acute ischemia within the superior left frontal and parietal lobes. No hemorrhage or mass effect. 2. Findings of chronic ischemic microangiopathy and generalized volume loss. Electronically Signed   By: Ulyses Jarred M.D.   On: 06/23/2021 02:47   US Carotid Bilateral (at [facility] and [facility] only)  Result Date: 06/23/2021 CLINICAL DATA:  Syncope EXAM: BILATERAL CAROTID DUPLEX ULTRASOUND TECHNIQUE: Pearline Cables scale imaging, color Doppler and duplex ultrasound were performed of bilateral carotid and vertebral arteries in the neck. COMPARISON:  None. FINDINGS: Criteria: Quantification of carotid stenosis is based on velocity parameters that correlate the residual internal carotid diameter with NASCET-based stenosis levels, using the diameter of the distal internal carotid lumen as the denominator for stenosis measurement. The following velocity measurements were obtained: RIGHT ICA: 75/31 cm/sec CCA: [id number] cm/sec SYSTOLIC ICA/CCA RATIO:  3.8 ECA: 100 cm/sec  LEFT ICA: 190/46 cm/sec CCA: [id number] cm/sec SYSTOLIC ICA/CCA RATIO:  1.2 ECA: 246 cm/sec RIGHT CAROTID ARTERY: There is near occlusion of the proximal right common carotid artery which results in slow/trickle flow/bidirectional flow into the more distal common carotid artery. There is relative elevated velocities seen in the internal carotid artery, consistent with severe proximal stenosis. RIGHT VERTEBRAL ARTERY:  Antegrade flow LEFT CAROTID ARTERY: Diffuse moderate atherosclerotic plaque results in less than 50% stenosis. LEFT VERTEBRAL ARTERY:  Antegrade flow Upper extremity blood pressures: RIGHT:  LEFT: IMPRESSION:  1. There is near occlusion of the proximal right common carotid artery. 2. No findings to suggest hemodynamically significant stenosis in the left carotid circulation by Doppler criteria. Diffuse moderate atherosclerotic plaque burden in the left carotid arteries results in less than 50% stenosis. 3. Antegrade flow in the bilateral vertebral arteries. These results will be called to the ordering clinician or representative by the Radiologist Assistant, and communication documented in the PACS or [employer]. Electronically Signed   By: Albin Felling M.D.   On: 06/23/2021 08:55   CT Angio Chest/Abd/Pel for Dissection W and/or Wo Contrast  Result Date: 06/22/2021 CLINICAL DATA:  Evaluate dissection. Syncope, poor perfusion to bilateral feet and right hand. eval dissection. Smoker/vasculopath. syncopal episode. Pt had urinary incontinence during syncopal episode. Episode was witnessed. EXAM: CT ANGIOGRAPHY CHEST, ABDOMEN AND PELVIS TECHNIQUE: Non-contrast CT of the chest was initially obtained. Multidetector CT imaging through the chest, abdomen and pelvis was performed using the standard protocol during bolus administration of intravenous contrast. Multiplanar reconstructed images and MIPs were obtained and reviewed to evaluate the Autumn anatomy. CONTRAST:  174m OMNIPAQUE IOHEXOL 350 MG/ML  SOLN COMPARISON:  CT abdomen pelvis 11/27/2019, CT chest 09/16/2019 FINDINGS: CTA CHEST FINDINGS Cardiovascular: Preferential opacification of the thoracic aorta. No evidence of thoracic aortic aneurysm or dissection. At least moderate atherosclerotic plaque of the thoracic aorta. Normal heart size. No significant pericardial effusion. Four-vessel coronary artery calcifications. The main pulmonary artery is normal in caliber. No central pulmonary embolus. Mediastinum/Nodes: No enlarged mediastinal, hilar, or axillary lymph nodes. Thyroid gland, trachea, and esophagus demonstrate no significant findings. Lungs/Pleura: No focal consolidation. No pulmonary nodule. No pulmonary mass. No pleural effusion. No pneumothorax. Musculoskeletal: No chest wall abnormality. No suspicious lytic or blastic osseous lesions. No acute displaced fracture. Review of the MIP images confirms the above findings. CTA ABDOMEN AND PELVIS FINDINGS Autumn Aorta: Severe calcified and noncalcified atherosclerotic plaque. Focal region of moderate to severe stenosis of the infrarenal abdominal aortic caliber extending for approximately 0.5 cm in craniocaudal dimension (8:99, 5:108) due to calcified and noncalcified atherosclerotic plaque. Normal caliber aorta without aneurysm, dissection, vasculitis or significant stenosis. Celiac: Atherosclerotic plaque. Patent without evidence of aneurysm, dissection, vasculitis or significant stenosis. SMA: Atherosclerotic plaque. Patent without evidence of aneurysm, dissection, vasculitis or significant stenosis. Renals: Atherosclerotic plaque. Both renal arteries are patent without evidence of aneurysm, dissection, vasculitis, fibromuscular dysplasia or significant stenosis. IMA: Atherosclerotic plaque. Patent without evidence of aneurysm, dissection, vasculitis or significant stenosis. Inflow: Focal severe stenosis of the proximal right common iliac artery (5:126, 7:75). Similarly focal severe stenosis of  the distal left common iliac artery (8:106, 5:1 30-131). Otherwise patent without evidence of aneurysm, dissection, vasculitis. Veins: No obvious venous abnormality within the limitations of this arterial phase study. Review of the MIP images confirms the above findings. NON-Autumn Hepatobiliary: No focal liver abnormality. No gallstones, gallbladder wall thickening, or pericholecystic fluid. No biliary dilatation. Pancreas: No focal lesion. Normal pancreatic contour. No surrounding inflammatory changes. No main pancreatic ductal dilatation. Spleen: Normal in size without focal abnormality. Adrenals/Urinary Tract: No adrenal nodule bilaterally. Bilateral kidneys enhance symmetrically. No hydronephrosis. No hydroureter. The urinary bladder is unremarkable. Stomach/Bowel: Stomach is within normal limits. No evidence of bowel wall thickening or dilatation. The appendix not definitely identified. Lymphatic: No lymphadenopathy. Reproductive: Status post hysterectomy. No adnexal masses. Other: No intraperitoneal free fluid. No intraperitoneal free gas. No organized fluid collection. Musculoskeletal: No abdominal wall hernia or abnormality. No suspicious lytic or blastic osseous lesions. No acute displaced fracture.  Review of the MIP images confirms the above findings. IMPRESSION: 1. No aortic dissection or aneurysm. 2. Severe atherosclerotic plaque with several areas of focal moderate to severe stenosis of the infrarenal abdominal aorta as well as bilateral common iliac arteries. Aortic Atherosclerosis (ICD10-I70.0). 3. Otherwise no acute intrathoracic, intra-abdominal, intrapelvic abnormality. Electronically Signed   By: Iven Finn M.D.   On: 06/22/2021 22:31     Assessment/Plan 1.  Acute mesenteric ischemia likely with a chronic baseline: The constellation of the patient's presenting symptoms and findings supports mesenteric ischemia.  This is a life-threatening condition.  I have wrapped commended  angiography with intervention.  Risks and benefits of been reviewed all questions have been answered patient has agreed to proceed.  2.  Atherosclerotic occlusive disease bilateral lower extremities with rest pain of the left lower extremity: Again the patient is suffering from critical limb ischemia.  She has changes both in the aorta as well as bilateral common iliacs that are consistent with inflow stenosis and this would need to be addressed at the time of SMA intervention and therefore that is the plan that I have discussed with the patient.  My hope is that by reestablishing a more normal inflow the patient's rest pain will be alleviated and we can work to better assess and treat any more distal stenoses at a later date when she is more fully recovered.  3.  Acute CVA: The patient does have 2 small infarcts noted on her MRI consistent with acute CVA.  This would be optimally treated with dual antiplatelet therapy as well as a statin which is the same treatment required for post angioplasty and stent placement.  I will use heparin intraoperatively but this represents a very low risk for hemorrhagic conversion given the brief nature of the heparinization.  Given these findings and the serious and potentially life-threatening situation given her mesenteric ischemia we will move forward today.  4.  Coronary artery disease Continue cardiac and antihypertensive medications as already ordered and reviewed, no changes at this time.  Continue statin as ordered and reviewed, no changes at this time  Nitrates PRN for chest pain    Hortencia Pilar, MD  06/23/2021 4:59 PM

## 2021-06-23 NOTE — H&P (Addendum)
History and Physical    Autumn Arnold [id number] DOB: April 14, 1951 DOA: 06/22/2021  PCP: [name], MD   Patient coming from: home  I have personally briefly reviewed patient's old medical records in [place]  Chief Complaint: passed out  HPI: Autumn Arnold is a 70 y.o. female with medical history significant for DM, nicotine dependence, anxiety, OSA, mild right lower extremity weakness from old stroke, brought in by EMS from the plasma center where she had a syncopal episode after donating plasma.  She was apparently found in the bathroom attempting to get up from the commode but was unable to do so independently.  She was incontinent of urine.  Patient said she went to the bathroom and started feeling very dizzy and then felt herself falling.  She was able to brace herself for the fall and states she must have been out for just a short while.  And she came around she felt weakness and numbness in both her legs and was unable to stand up unassisted.  She denied associated headache or visual disturbance.  States that prior to the donating plasma, which she has done several times in the past, she was in her usual state of health.    She has had no cough, shortness of breath, fever or chills.  No recent nausea, vomiting, abdominal pain or diarrhea.  Denies prior or current headache, visual disturbance or one-sided numbness weakness or tingling.  Since arriving to the hospital she has persistent numbness in the left leg but states her right leg feels better.  ED course: On arrival, afebrile, BP 109/83, heart rate 117 respirations 26 with O2 sat 98% on room air Blood work with WBC 22,000, lactic acid 5.6-6.7 Glucose 276 with anion gap of 16 and bicarb 16 Troponin 6-8 COVID and flu negative, urinalysis 5 ketones otherwise unremarkable  EKG, personally viewed and interpreted: Sinus tachycardia at 116 with nonspecific ST-T wave changes  Imaging: CT angio chest abdomen and pelvis:  Severe atherosclerotic plaque with several areas of focal moderate to severe stenosis of the infrarenal abdominal aorta as well as bilateral common iliac arteries.  Otherwise no acute process CT head: No acute intracranial abnormality  Physical exam by EDP was consistent with acute limb ischemia left leg.  The ED provider spoke with vascular surgeon, Dr. Franchot Gallo who recommended heparin infusion for angiogram in the a.m. Patient was bolused with IV fluids and started on broad-spectrum antibiotics for sepsis of unknown source Hospitalist consulted for admission.  Review of Systems: As per HPI otherwise all other systems on review of systems negative.    Past Medical History:  Diagnosis Date   Anxiety    Arthritis    Asthma    Bilateral lower extremity edema    COPD (chronic obstructive pulmonary disease) (HCC)    Cough    CHRONIC   Diabetes mellitus 2008   Edema    LEGS/FEET   GERD (gastroesophageal reflux disease)    Hyperlipidemia    Hypertension    Shortness of breath dyspnea    DOE   Sleep apnea    CPAP at [facility], Dr. Humphrey Rolls   Stroke [facility]) 02/2010   headache, left arm numbness   Wheezing     Past Surgical History:  Procedure Laterality Date   ABDOMINAL HYSTERECTOMY     ANTERIOR FUSION CERVICAL SPINE     CARPAL TUNNEL RELEASE     Bilateral   CATARACT EXTRACTION W/PHACO Left 03/12/2016   Procedure: CATARACT EXTRACTION PHACO  AND INTRAOCULAR LENS PLACEMENT (IOC);  Surgeon: Estill Cotta, MD;  Location: [facility] ORS;  Service: Ophthalmology;  Laterality: Left;  [place] 01:12 AP% 23.4 CDE 29.83 fluid pack lot # [id number]   CATARACT EXTRACTION W/PHACO Right 04/16/2016   Procedure: CATARACT EXTRACTION PHACO AND INTRAOCULAR LENS PLACEMENT (IOC);  Surgeon: Estill Cotta, MD;  Location: [facility] ORS;  Service: Ophthalmology;  Laterality: Right;  [place] 01:17 AP% 22.5 CDE 35.08 Fluid pack lot # [id number]   CESAREAN SECTION     COLONOSCOPY WITH PROPOFOL N/A 08/12/2018   Procedure: COLONOSCOPY  WITH PROPOFOL;  Surgeon: Toledo, Benay Pike, MD;  Location: [facility] ENDOSCOPY;  Service: Gastroenterology;  Laterality: N/A;   ELBOW SURGERY     Tendonitis   ESOPHAGOGASTRODUODENOSCOPY (EGD) WITH PROPOFOL N/A 08/12/2018   Procedure: ESOPHAGOGASTRODUODENOSCOPY (EGD) WITH PROPOFOL;  Surgeon: Toledo, Benay Pike, MD;  Location: [facility] ENDOSCOPY;  Service: Gastroenterology;  Laterality: N/A;   INCONTINENCE SURGERY     TONSILLECTOMY       reports that she has been smoking. She has been smoking an average of 1 pack per day. She has never used smokeless tobacco. She reports current alcohol use of about 7.0 standard drinks per week. She reports that she does not use drugs.  Allergies  Allergen Reactions   Lisinopril     Family History  Problem Relation Age of Onset   CAD Mother    Hypertension Mother    Arthritis Mother    Diabetes Father    CAD Father       Prior to Admission medications   Medication Sig Start Date End Date Taking? Authorizing Provider  busPIRone (BUSPAR) 5 MG tablet Take 5 mg by mouth 2 (two) times daily. 04/18/21  Yes [provider]  albuterol (PROAIR HFA) 108 (90 Base) MCG/ACT inhaler Inhale 2 puffs into the lungs as needed.    [provider]  alendronate (FOSAMAX) 70 MG tablet Take 70 mg by mouth once a week. Take with a full glass of water on an empty stomach.    [provider]  amitriptyline (ELAVIL) 150 MG tablet Take by mouth daily. Reported on 12/20/2015    [provider]  aspirin EC 81 MG EC tablet Take 1 tablet (81 mg total) by mouth daily. 11/08/15   Gladstone Lighter, MD  aspirin-acetaminophen-caffeine (EXCEDRIN MIGRAINE) [phone] MG tablet Take by mouth every 6 (six) hours as needed for headache.    [provider]  atorvastatin (LIPITOR) 40 MG tablet Take 1 tablet (40 mg total) by mouth daily at 6 PM. 11/08/15   Gladstone Lighter, MD  atorvastatin (LIPITOR) 80 MG tablet Take 80 mg by mouth daily. 06/13/21   [provider]  Calcium Carb-Cholecalciferol 600-400 MG-UNIT TABS Take 1 tablet by mouth 2 (two) times daily.    [provider]  glimepiride (AMARYL) 4 MG tablet Take 4 mg by mouth daily with breakfast.    [provider]  glipiZIDE (GLUCOTROL) 10 MG tablet Take 20 mg by mouth 2 (two) times daily. 06/13/21   [provider]  lisinopril (PRINIVIL,ZESTRIL) 2.5 MG tablet Take 2.5 mg by mouth daily.    [provider]  metFORMIN (GLUCOPHAGE) 1000 MG tablet Take 1,000 mg by mouth 2 (two) times daily. 06/13/21   [provider]  metFORMIN (GLUCOPHAGE) 500 MG tablet Take 1 tablet (500 mg total) by mouth 2 (two) times daily with a meal. Patient taking differently: Take 1,000 mg by mouth 2 (two) times daily with a meal.  11/08/15  Gladstone Lighter, MD  metoprolol tartrate (LOPRESSOR) 25 MG tablet Take 1 tablet (25 mg total) by mouth 2 (two) times daily. Patient taking differently: Take 50 mg by mouth 2 (two) times daily.  11/08/15   Gladstone Lighter, MD  montelukast (SINGULAIR) 10 MG tablet Take 10 mg by mouth daily. 06/13/21   [provider]  omeprazole (PRILOSEC) 40 MG capsule Take 40 mg by mouth daily.    [provider]  sertraline (ZOLOFT) 100 MG tablet Take 1 tablet (100 mg total) by mouth daily. 11/08/15   Gladstone Lighter, MD  tiZANidine (ZANAFLEX) 4 MG tablet Take 4 mg by mouth 2 (two) times daily. 05/22/21   [provider]  vitamin B-12 (CYANOCOBALAMIN) 1000 MCG tablet Take 1,000 mcg by mouth daily. Reported on 12/20/2015    [provider]    Physical Exam: Vitals:   06/22/21 2248 06/22/21 2249 06/22/21 2300 06/22/21 2315  BP: (!) 176/66  (!) 185/63   Pulse: (!) 110 (!) 110 (!) 107   Resp: 16 (!) 24  20  Temp:      TempSrc:      SpO2: 99% 99% 96%   Weight:      Height:         Vitals:   06/22/21 2248 06/22/21 2249 06/22/21 2300 06/22/21 2315  BP: (!) 176/66  (!) 185/63   Pulse: (!) 110 (!) 110 (!) 107    Resp: 16 (!) 24  20  Temp:      TempSrc:      SpO2: 99% 99% 96%   Weight:      Height:          Constitutional: Alert and oriented x 3 . Not in any apparent distress HEENT:      Head: Normocephalic and atraumatic.         Eyes: PERLA, EOMI, Conjunctivae are normal. Sclera is non-icteric.       Mouth/Throat: Mucous membranes are moist.       Neck: Supple with no signs of meningismus. Cardiovascular: Regular rate and rhythm. No murmurs, gallops, or rubs. 2+ symmetrical distal pulses are present . No JVD. No LE edema Respiratory: Respiratory effort normal .Lungs sounds clear bilaterally. No wheezes, crackles, or rhonchi.  Gastrointestinal: Soft, non tender, and non distended with positive bowel sounds.  Genitourinary: No CVA tenderness. Musculoskeletal: Nontender with normal range of motion in all extremities. No cyanosis, or erythema of extremities. Neurologic:  Face is symmetric. Moving all extremities. No gross focal neurologic deficits . Skin: Skin is warm, dry.  No rash or ulcers Psychiatric: Mood and affect are normal    Labs on Admission: I have personally reviewed following labs and imaging studies  CBC: Recent Labs  Lab 06/22/21 1917  WBC 22.1*  HGB 14.1  HCT 43.5  MCV 88.4  PLT [id number]   Basic Metabolic Panel: Recent Labs  Lab 06/22/21 1917  NA 135  K 3.4*  CL 103  CO2 16*  GLUCOSE 276*  BUN 14  CREATININE 0.99  CALCIUM 8.7*   GFR: Estimated Creatinine Clearance: 44 mL/min (by C-G formula based on SCr of 0.99 mg/dL). Liver Function Tests: No results for input(s): AST, ALT, ALKPHOS, BILITOT, PROT, ALBUMIN in the last 168 hours. No results for input(s): LIPASE, AMYLASE in the last 168 hours. No results for input(s): AMMONIA in the last 168 hours. Coagulation Profile: Recent Labs  Lab 06/22/21 2051  INR 1.0   Cardiac Enzymes: No results for input(s): CKTOTAL, CKMB, CKMBINDEX, TROPONINI in  the last 168 hours. BNP (last 3 results) No results for  input(s): PROBNP in the last 8760 hours. HbA1C: No results for input(s): HGBA1C in the last 72 hours. CBG: No results for input(s): GLUCAP in the last 168 hours. Lipid Profile: No results for input(s): CHOL, HDL, LDLCALC, TRIG, CHOLHDL, LDLDIRECT in the last 72 hours. Thyroid Function Tests: No results for input(s): TSH, T4TOTAL, FREET4, T3FREE, THYROIDAB in the last 72 hours. Anemia Panel: No results for input(s): VITAMINB12, FOLATE, FERRITIN, TIBC, IRON, RETICCTPCT in the last 72 hours. Urine analysis:    Component Value Date/Time   COLORURINE YELLOW (A) 06/22/2021 2052   APPEARANCEUR CLEAR (A) 06/22/2021 2052   LABSPEC 1.010 06/22/2021 2052   PHURINE 6.0 06/22/2021 2052   GLUCOSEU 50 (A) 06/22/2021 2052   HGBUR NEGATIVE 06/22/2021 2052   BILIRUBINUR NEGATIVE 06/22/2021 2052   KETONESUR 5 (A) 06/22/2021 2052   PROTEINUR NEGATIVE 06/22/2021 2052   NITRITE NEGATIVE 06/22/2021 2052   LEUKOCYTESUR NEGATIVE 06/22/2021 2052    Radiological Exams on Admission: CT HEAD WO CONTRAST  Result Date: 06/22/2021 CLINICAL DATA:  Syncope. EXAM: CT HEAD WITHOUT CONTRAST TECHNIQUE: Contiguous axial images were obtained from the base of the skull through the vertex without intravenous contrast. COMPARISON:  November 07, 2015 FINDINGS: Brain: There is mild cerebral atrophy with widening of the extra-axial spaces and ventricular dilatation. There are areas of decreased attenuation within the white matter tracts of the supratentorial brain, consistent with microvascular disease changes. Small chronic bilateral para thalamic lacunar infarcts are seen. Vascular: No hyperdense vessel or unexpected calcification. Skull: Normal. Negative for fracture or focal lesion. Sinuses/Orbits: No acute finding. Other: None. IMPRESSION: 1. No acute intracranial abnormality. 2. Generalized cerebral atrophy with chronic microvascular white matter disease. Electronically Signed   By: Virgina Norfolk M.D.   On: 06/22/2021 19:40    CT Angio Chest/Abd/Pel for Dissection W and/or Wo Contrast  Result Date: 06/22/2021 CLINICAL DATA:  Evaluate dissection. Syncope, poor perfusion to bilateral feet and right hand. eval dissection. Smoker/vasculopath. syncopal episode. Pt had urinary incontinence during syncopal episode. Episode was witnessed. EXAM: CT ANGIOGRAPHY CHEST, ABDOMEN AND PELVIS TECHNIQUE: Non-contrast CT of the chest was initially obtained. Multidetector CT imaging through the chest, abdomen and pelvis was performed using the standard protocol during bolus administration of intravenous contrast. Multiplanar reconstructed images and MIPs were obtained and reviewed to evaluate the vascular anatomy. CONTRAST:  165m OMNIPAQUE IOHEXOL 350 MG/ML SOLN COMPARISON:  CT abdomen pelvis 11/27/2019, CT chest 09/16/2019 FINDINGS: CTA CHEST FINDINGS Cardiovascular: Preferential opacification of the thoracic aorta. No evidence of thoracic aortic aneurysm or dissection. At least moderate atherosclerotic plaque of the thoracic aorta. Normal heart size. No significant pericardial effusion. Four-vessel coronary artery calcifications. The main pulmonary artery is normal in caliber. No central pulmonary embolus. Mediastinum/Nodes: No enlarged mediastinal, hilar, or axillary lymph nodes. Thyroid gland, trachea, and esophagus demonstrate no significant findings. Lungs/Pleura: No focal consolidation. No pulmonary nodule. No pulmonary mass. No pleural effusion. No pneumothorax. Musculoskeletal: No chest wall abnormality. No suspicious lytic or blastic osseous lesions. No acute displaced fracture. Review of the MIP images confirms the above findings. CTA ABDOMEN AND PELVIS FINDINGS VASCULAR Aorta: Severe calcified and noncalcified atherosclerotic plaque. Focal region of moderate to severe stenosis of the infrarenal abdominal aortic caliber extending for approximately 0.5 cm in craniocaudal dimension (8:99, 5:108) due to calcified and noncalcified  atherosclerotic plaque. Normal caliber aorta without aneurysm, dissection, vasculitis or significant stenosis. Celiac: Atherosclerotic plaque. Patent without evidence of aneurysm, dissection, vasculitis or  significant stenosis. SMA: Atherosclerotic plaque. Patent without evidence of aneurysm, dissection, vasculitis or significant stenosis. Renals: Atherosclerotic plaque. Both renal arteries are patent without evidence of aneurysm, dissection, vasculitis, fibromuscular dysplasia or significant stenosis. IMA: Atherosclerotic plaque. Patent without evidence of aneurysm, dissection, vasculitis or significant stenosis. Inflow: Focal severe stenosis of the proximal right common iliac artery (5:126, 7:75). Similarly focal severe stenosis of the distal left common iliac artery (8:106, 5:1 30-131). Otherwise patent without evidence of aneurysm, dissection, vasculitis. Veins: No obvious venous abnormality within the limitations of this arterial phase study. Review of the MIP images confirms the above findings. NON-VASCULAR Hepatobiliary: No focal liver abnormality. No gallstones, gallbladder wall thickening, or pericholecystic fluid. No biliary dilatation. Pancreas: No focal lesion. Normal pancreatic contour. No surrounding inflammatory changes. No main pancreatic ductal dilatation. Spleen: Normal in size without focal abnormality. Adrenals/Urinary Tract: No adrenal nodule bilaterally. Bilateral kidneys enhance symmetrically. No hydronephrosis. No hydroureter. The urinary bladder is unremarkable. Stomach/Bowel: Stomach is within normal limits. No evidence of bowel wall thickening or dilatation. The appendix not definitely identified. Lymphatic: No lymphadenopathy. Reproductive: Status post hysterectomy. No adnexal masses. Other: No intraperitoneal free fluid. No intraperitoneal free gas. No organized fluid collection. Musculoskeletal: No abdominal wall hernia or abnormality. No suspicious lytic or blastic osseous lesions. No  acute displaced fracture. Review of the MIP images confirms the above findings. IMPRESSION: 1. No aortic dissection or aneurysm. 2. Severe atherosclerotic plaque with several areas of focal moderate to severe stenosis of the infrarenal abdominal aorta as well as bilateral common iliac arteries. Aortic Atherosclerosis (ICD10-I70.0). 3. Otherwise no acute intrathoracic, intra-abdominal, intrapelvic abnormality. Electronically Signed   By: Iven Finn M.D.   On: 06/22/2021 22:31     Assessment/Plan 70 year old female with history of DM, nicotine dependence, anxiety, OSA, right lower extremity weakness from old stroke, brought in by EMS from the plasma center where she had a syncopal episode after donating plasma, or waking with numbness and weakness of lower extremities,L>R.    Syncope and collapse - Patient with syncopal episode following donation of plasma - Suspect related to acute illness - Work-up concerning for sepsis/critical limb ischemia/hyperglycemia versus DKA/TIA    Sepsis of unknown source/lactic acidosis (HCC) -Patient was tachycardic and tachypneic with leukocytosis of 22,000 and elevated lactic acid but no source of infection - Elevated lactic acid could be related to limb ischemia - We will treat as sepsis of unknown source - Follow procalcitonin to assess for sepsis versus lactic acidosis    Critical lower limb ischemia left leg (HCC) - Limb ischemia per physical exam in ED with CTA showing extensive atherosclerotic plaques, improved by admission - Continue heparin infusion - Vascular consult - N.p.o. for possible angiogram in the a.m.  Possible TIA Right lower extremity weakness from old stroke - Continue heparin drip - Continue home atorvastatin - Permissive hypertension - Stroke work-up to include continuous cardiac monitoring, echocardiogram and carotid Doppler, MRI - Neurologic checks - Neurology consult    Hyperglycemia due to type 2 diabetes mellitus (HCC)     Diabetic neuropathy - Blood sugar 276  - Sliding scale insulin coverage - Continue gabapentin for neuropathy  Anxiety - Continue home meds  Residual right leg weakness as late effect of stroke  -Continue atorvastatin. - Hold aspirin while on heparin infusion pending vascular recommendation    Tobacco dependence -Nicotine patch  DVT prophylaxis: Heparin infusion Code Status: full code  Family Communication:  none  Disposition Plan: Back to previous home environment Consults called: Vascular, neurology  Status:At the time of admission, it appears that the appropriate admission status for this patient is INPATIENT. This is judged to be reasonable and necessary in order to provide the required intensity of service to ensure the patient's safety given the presenting symptoms, physical exam findings, and initial radiographic and laboratory data in the context of their  Comorbid conditions.   Patient requires inpatient status due to high intensity of service, high risk for further deterioration and high frequency of surveillance required.   I certify that at the point of admission it is my clinical judgment that the patient will require inpatient hospital care spanning beyond [place] MD [facility]     06/23/2021, 12:04 AM

## 2021-06-23 NOTE — Consult Note (Signed)
[place] for IV Heparin Indication:  PAD  / limb ischemia  Patient Measurements: Heparin Dosing Weight: 56.9 kg  Labs: Recent Labs    06/22/21 1917 06/22/21 2051 06/22/21 2145 06/22/21 2357 06/23/21 0456  HGB 14.1  --   --   --  12.4  HCT 43.5  --   --   --  36.9  PLT 307  --   --   --  208  APTT  --  23*  --   --   --   LABPROT  --  12.8  --   --   --   INR  --  1.0  --   --   --   CREATININE 0.99  --   --  0.92  --   TROPONINIHS 6  --  8  --   --      Estimated Creatinine Clearance: 47.4 mL/min (by C-G formula based on SCr of 0.92 mg/dL).   Medical History: Past Medical History:  Diagnosis Date   Anxiety    Arthritis    Asthma    Bilateral lower extremity edema    COPD (chronic obstructive pulmonary disease) ([place])    Cough    CHRONIC   Diabetes mellitus 2008   Edema    LEGS/FEET   GERD (gastroesophageal reflux disease)    Hyperlipidemia    Hypertension    Shortness of breath dyspnea    DOE   Sleep apnea    CPAP at [facility], Dr. Humphrey Rolls   Stroke [facility]) 02/2010   headache, left arm numbness   Wheezing     Medications:  No anticoagulation prior to admission per my chart review. Medication reconciliation is pending  Assessment: Patient is a 70 y/o F with medical history as above who presented to the ED with syncopal episode while donating plasma. She is also endorsing leg pain and numbness. Lactate level elevated. Imaging with "severe atherosclerotic plaque with several areas of focal moderate to severe stenosis of the infrarenal abdominal aorta as well as bilateral common iliac arteries". Pharmacy has been consulted to initiate heparin infusion for PAD / limb ischemia.  Baseline CBC acceptable. Baseline aPTT 23s and INR 1.  [id number] HL 0.17, subtherapeutic; 950 un/hr  Goal of Therapy:  Heparin level 0.3 - 0.5 units/ml (lower goal per discussion with MD given hematochezia) Monitor platelets by anticoagulation protocol:  Yes   Plan:  --Heparin level is subtherapeutic --Increase heparin infusion to 1050 units/hr, no bolus --Will check HL 6 hours after rate change --Daily CBC per protocol while on IV heparin --Continue to monitor closely for further rectal bleeding  Benita Gutter 06/23/2021,5:27 AM

## 2021-06-23 NOTE — Evaluation (Signed)
Physical Therapy Evaluation Patient Details Name: Autumn Arnold MRN: [id number] DOB: [birth year] Today's Date: 06/23/2021   History of Present Illness  70 y.o. female with medical history significant for DM, nicotine dependence, anxiety, OSA, mild right lower extremity weakness from old stroke, brought in by EMS from the plasma center where she had a syncopal episode after donating plasma.  She was apparently found in the bathroom attempting to get up from the commode but was unable to do so independently. 06/23/21 MRI brain: Small foci of acute ischemia within the superior left frontal and parietal lobes.  Clinical Impression  Pt did relatively well with PT exam and repeatedly reported feeling close to her baseline.  She was able to initially walk ~75 ft w/o AD and good confidence but started to have some fatigue and increased L hip/calf pain that necessitated UE/AD use and more and more reliance on the walker.  She was able to negotiate up/down 2 steps but was very reliant on the rail, had buckling in the hip/knee and increased pain with the effort.  Pt is not at her baseline and though initially she did not feel she needed HHPT by the end of the session she was in agreement that she would benefit from continued f/u after d/c.      Follow Up Recommendations Home health PT;Supervision - Intermittent    Equipment Recommendations  None recommended by PT (instructed to use walker initially)    Recommendations for Other Services       Precautions / Restrictions Precautions Precautions: Fall Restrictions Weight Bearing Restrictions: No      Mobility  Bed Mobility Overal bed mobility: Modified Independent             General bed mobility comments: HOB elevated    Transfers Overall transfer level: Modified independent Equipment used: Rolling walker (2 wheeled) Transfers: Sit to/from Stand Sit to Stand: Modified independent (Device/Increase time)         General transfer  comment: able to rise safely and confidently  Ambulation/Gait Ambulation/Gait assistance: Min guard Gait Distance (Feet): 250 Feet Assistive device: Rolling walker (2 wheeled)       General Gait Details: Pt was able to ambulate with and w/o AD relatively well, however with increased distance she started to have increased L side pain and subsequently needed to use the walker and by the end ha definite increased reliance AD.  Stairs Stairs: Yes Stairs assistance: Min assist Stair Management: One rail Right Number of Stairs: 2 General stair comments: Pt does not have true rails at home, highly reliant on them today and had LE bucking (b/l) and increased pain with the effort.  No LOBs but not particularly confident or as effective as either of [place] expected.  Wheelchair Mobility    Modified Rankin (Stroke Patients Only)       Balance Overall balance assessment: Needs assistance Sitting-balance support: No upper extremity supported;Feet supported Sitting balance-Leahy Scale: Good     Standing balance support: No upper extremity supported Standing balance-Leahy Scale: Fair Standing balance comment: Statically she did relatively well and even during initial ambulation, but with increased pain and fatigue during prolonged standing she was less steady and more reliant on UEs                             Pertinent Vitals/Pain Pain Assessment: Faces Faces Pain Scale: Hurts little more Pain Location: L hip, knee and lateral calf  Home Living Family/patient expects to be discharged to:: Private residence Living Arrangements: Spouse/significant other;Children Available Help at Discharge: Family;Available PRN/intermittently Type of Home: House Home Access: Stairs to enter Entrance Stairs-Rails: None (column) Entrance Stairs-Number of Steps: 2 Home Layout: One level Home Equipment: Walker - 2 wheels (loftstrand crutch)      Prior Function Level of Independence:  Independent         Comments: pt reports using forearm crutch PRN. Pt drives, completes groceries, etc.  This fall and additional 1 in the last 6 months     Hand Dominance   Dominant Hand: Right    Extremity/Trunk Assessment   Upper Extremity Assessment Upper Extremity Assessment: Overall WFL for tasks assessed    Lower Extremity Assessment Lower Extremity Assessment: Overall WFL for tasks assessed (did endorse some pain (functional but mild weakness) with L hip ABd and ankle eversion)       Communication   Communication: No difficulties  Cognition Arousal/Alertness: Awake/alert Behavior During Therapy: WFL for tasks assessed/performed Overall Cognitive Status: Within Functional Limits for tasks assessed                                        General Comments General comments (skin integrity, edema, etc.): Pt reports feeling near her baseline, however she was struck by how quickly she fatigued and how pain started to limit her tolerance    Exercises Other Exercises Other Exercises: Pt educated re; OT role, DME recs, d/c recs, falls prevention, ECS Other Exercises: LBD, toileting, hand washing, sup>sit, sit<>stand x2, sitting/standing balance/tolerance, functional reach   Assessment/Plan    PT Assessment Patient needs continued PT services  PT Problem List Decreased strength;Decreased range of motion;Decreased activity tolerance;Decreased balance;Decreased mobility;Decreased knowledge of use of DME;Decreased safety awareness;Decreased coordination;Pain       PT Treatment Interventions DME instruction;Gait training;Stair training;Functional mobility training;Therapeutic activities;Therapeutic exercise;Balance training;Cognitive remediation    PT Goals (Current goals can be found in the Care Plan section)  Acute Rehab PT Goals Patient Stated Goal: to go home PT Goal Formulation: With patient Time For Goal Achievement: 07/07/21 Potential to Achieve  Goals: Good    Frequency 7X/week   Barriers to discharge        Co-evaluation               AM-PAC PT "6 Clicks" Mobility  Outcome Measure Help needed turning from your back to your side while in a flat bed without using bedrails?: None Help needed moving from lying on your back to sitting on the side of a flat bed without using bedrails?: None Help needed moving to and from a bed to a chair (including a wheelchair)?: None Help needed standing up from a chair using your arms (e.g., wheelchair or bedside chair)?: None Help needed to walk in hospital room?: A Little Help needed climbing 3-5 steps with a railing? : A Lot 6 Click Score: 21    End of Session Equipment Utilized During Treatment: Gait belt Activity Tolerance: Patient limited by pain;Patient limited by fatigue;Patient tolerated treatment well Patient left: in bed;with call bell/phone within reach Nurse Communication: Mobility status PT Visit Diagnosis: Muscle weakness (generalized) (M62.81);Difficulty in walking, not elsewhere classified (R26.2);Pain;Other symptoms and signs involving the nervous system (R29.898) Pain - Right/Left: Left Pain - part of body: Hip    Time: [id number] PT Time Calculation (min) (ACUTE ONLY): 25 min   Charges:  PT Evaluation $PT Eval Low Complexity: 1 Low PT Treatments $Gait Training: 8-22 mins        Kreg Shropshire, DPT 06/23/2021, 12:55 PM

## 2021-06-23 NOTE — Interval H&P Note (Signed)
History and Physical Interval Note:  06/23/2021 5:10 PM  Autumn Arnold  has presented today for surgery, with the diagnosis of ischemic left leg.  The various methods of treatment have been discussed with the patient and family. After consideration of risks, benefits and other options for treatment, the patient has consented to  Procedure(s): Lower Extremity Angiography (Left) as a surgical intervention.  The patient's history has been reviewed, patient examined, no change in status, stable for surgery.  I have reviewed the patient's chart and labs.  Questions were answered to the patient's satisfaction.     Hortencia Pilar

## 2021-06-23 NOTE — Consult Note (Addendum)
NEUROLOGY CONSULTATION NOTE   Date of service: June 23, 2021 Patient Name: Autumn Arnold MRN:  [id number] DOB:  [birth year] Reason for consult: acute ischemic stroke Requesting physician: Loletha Grayer MD _ _ _   _ __   _ __ _ _  __ __   _ __   __ _  History of Present Illness   This is a 70 year old woman with a history of her diabetes, nicotine dependence, anxiety, obstructive sleep apnea, remote ischemic stroke with mild residual right lower extremity weakness who is brought in by EMS overnight after she had a syncopal episode after donating plasma.  She was still at the plasma center when this occurred.  After she donated she went to the bathroom and was attempting to get up from the commode but was unable to do so without assistance.  She had incontinence of urine at the time.  When she got up to go to the bathroom she started feeling very dizzy and then fell.  It is unclear if she actually lost consciousness.  After this occurred she felt generalized weakness and numbness in both of her legs and had a difficult time standing up.  She has donated plasma multiple times in the past and did not have any issues.  No signs or symptoms of infection.  Denies nausea, vomiting, abdominal pain, diarrhea, cough, shortness of breath, fever, or chills.  Aside from her mild right lower extremity weakness which is chronic for her after prior stroke she did not have any focal weakness.  She has a history of moderate bilateral peripheral neuropathy in her lower extremities which causes painful paresthesias.    On arrival she was noted to have a white count of 22,000 and a lactic acid of 5.6-6.7.  She was started on empiric abx. Her vitals are stable and she was afebrile.  Glucose was 276.  COVID and flu were negative.  She reported on arrival to the emergency department that her left leg was still feeling numb and was also more painful than it typically is.  Vascular surgery was notified when patient was in the  ED and there was concern for left lower extremity ischemia and she was placed on a heparin drip with plan to have an angiogram with vascular surgery today.  She had a brain MRI without contrast that was significant for small foci of acute ischemia in the superior left frontal and parietal lobes with no hemorrhage or mass-effect.  Carotid ultrasound showed near occlusion of the proximal right common carotid artery without hemodynamically significant stenosis of the left.  MRA head and neck has been ordered and is pending for tomorrow morning.  TTE pending.  Patient is scheduled for arteriogram with vascular surgery of her left lower extremity today.  LDL 164 A1c 7.3   ROS   Per HPI; all other systems reviewed and are negative  Past History   Past Medical History:  Diagnosis Date  . Anxiety   . Arthritis   . Asthma   . Bilateral lower extremity edema   . COPD (chronic obstructive pulmonary disease) ([place])   . Cough    CHRONIC  . Diabetes mellitus 2008  . Edema    LEGS/FEET  . GERD (gastroesophageal reflux disease)   . Hyperlipidemia   . Hypertension   . Shortness of breath dyspnea    DOE  . Sleep apnea    CPAP at [facility], Dr. Humphrey Rolls  . Stroke [facility]) 02/2010   headache, left arm numbness  .  Wheezing    Past Surgical History:  Procedure Laterality Date  . ABDOMINAL HYSTERECTOMY    . ANTERIOR FUSION CERVICAL SPINE    . CARPAL TUNNEL RELEASE     Bilateral  . CATARACT EXTRACTION W/PHACO Left 03/12/2016   Procedure: CATARACT EXTRACTION PHACO AND INTRAOCULAR LENS PLACEMENT (IOC);  Surgeon: Estill Cotta, MD;  Location: [facility] ORS;  Service: Ophthalmology;  Laterality: Left;  [place] 01:12 AP% 23.4 CDE 29.83 fluid pack lot # [id number]  . CATARACT EXTRACTION W/PHACO Right 04/16/2016   Procedure: CATARACT EXTRACTION PHACO AND INTRAOCULAR LENS PLACEMENT (IOC);  Surgeon: Estill Cotta, MD;  Location: [facility] ORS;  Service: Ophthalmology;  Laterality: Right;  [place] 01:17 AP% 22.5 CDE 35.08 Fluid  pack lot # [id number]  . CESAREAN SECTION    . COLONOSCOPY WITH PROPOFOL N/A 08/12/2018   Procedure: COLONOSCOPY WITH PROPOFOL;  Surgeon: Toledo, Benay Pike, MD;  Location: [facility] ENDOSCOPY;  Service: Gastroenterology;  Laterality: N/A;  . ELBOW SURGERY     Tendonitis  . ESOPHAGOGASTRODUODENOSCOPY (EGD) WITH PROPOFOL N/A 08/12/2018   Procedure: ESOPHAGOGASTRODUODENOSCOPY (EGD) WITH PROPOFOL;  Surgeon: Toledo, Benay Pike, MD;  Location: [facility] ENDOSCOPY;  Service: Gastroenterology;  Laterality: N/A;  . INCONTINENCE SURGERY    . TONSILLECTOMY     Family History  Problem Relation Age of Onset  . CAD Mother   . Hypertension Mother   . Arthritis Mother   . Diabetes Father   . CAD Father    Social History   Socioeconomic History  . Marital status: Married    Spouse name: Not on file  . Number of children: Not on file  . Years of education: Not on file  . Highest education level: Not on file  Occupational History  . Not on file  Tobacco Use  . Smoking status: Every Day    Packs/day: 1.00    Types: Cigarettes  . Smokeless tobacco: Never  Substance and Sexual Activity  . Alcohol use: Yes    Alcohol/week: 7.0 standard drinks    Types: 7 Cans of beer per week  . Drug use: No  . Sexual activity: Not on file  Other Topics Concern  . Not on file  Social History Narrative  . Not on file   Social Determinants of Health   Financial Resource Strain: Not on file  Food Insecurity: Not on file  Transportation Needs: Not on file  Physical Activity: Not on file  Stress: Not on file  Social Connections: Not on file   Allergies  Allergen Reactions  . Lisinopril     Medications   Medications Prior to Admission  Medication Sig Dispense Refill Last Dose  . busPIRone (BUSPAR) 5 MG tablet Take 5 mg by mouth 2 (two) times daily.     Marland Kitchen albuterol (PROAIR HFA) 108 (90 Base) MCG/ACT inhaler Inhale 2 puffs into the lungs as needed.     Marland Kitchen alendronate (FOSAMAX) 70 MG tablet Take 70 mg by mouth once  a week. Take with a full glass of water on an empty stomach.     Marland Kitchen amitriptyline (ELAVIL) 150 MG tablet Take by mouth daily. Reported on 12/20/2015     . aspirin EC 81 MG EC tablet Take 1 tablet (81 mg total) by mouth daily. 30 tablet 2   . aspirin-acetaminophen-caffeine (EXCEDRIN MIGRAINE) [id number] MG tablet Take by mouth every 6 (six) hours as needed for headache.     Marland Kitchen atorvastatin (LIPITOR) 40 MG tablet Take 1 tablet (40 mg total) by mouth daily at 6  PM. 30 tablet 2   . atorvastatin (LIPITOR) 80 MG tablet Take 80 mg by mouth daily.     . Calcium Carb-Cholecalciferol 600-400 MG-UNIT TABS Take 1 tablet by mouth 2 (two) times daily.     Marland Kitchen glimepiride (AMARYL) 4 MG tablet Take 4 mg by mouth daily with breakfast.     . glipiZIDE (GLUCOTROL) 10 MG tablet Take 20 mg by mouth 2 (two) times daily.     Marland Kitchen lisinopril (PRINIVIL,ZESTRIL) 2.5 MG tablet Take 2.5 mg by mouth daily.     . metFORMIN (GLUCOPHAGE) 1000 MG tablet Take 1,000 mg by mouth 2 (two) times daily.     . metFORMIN (GLUCOPHAGE) 500 MG tablet Take 1 tablet (500 mg total) by mouth 2 (two) times daily with a meal. (Patient taking differently: Take 1,000 mg by mouth 2 (two) times daily with a meal. ) 60 tablet 2   . metoprolol tartrate (LOPRESSOR) 25 MG tablet Take 1 tablet (25 mg total) by mouth 2 (two) times daily. (Patient taking differently: Take 50 mg by mouth 2 (two) times daily. ) 60 tablet 2   . montelukast (SINGULAIR) 10 MG tablet Take 10 mg by mouth daily.     Marland Kitchen omeprazole (PRILOSEC) 40 MG capsule Take 40 mg by mouth daily.     . sertraline (ZOLOFT) 100 MG tablet Take 1 tablet (100 mg total) by mouth daily. 30 tablet 2   . tiZANidine (ZANAFLEX) 4 MG tablet Take 4 mg by mouth 2 (two) times daily.     . vitamin B-12 (CYANOCOBALAMIN) 1000 MCG tablet Take 1,000 mcg by mouth daily. Reported on 12/20/2015        Vitals   Vitals:   06/23/21 0240 06/23/21 0832 06/23/21 1116 06/23/21 1530  BP:  (!) 165/63 (!) 150/82 (!) 160/71  Pulse:   (!) 102 (!) 104 61  Resp:  '15 16 17  '$ Temp:  98.2 F (36.8 C) 97.8 F (36.6 C) 98.5 F (36.9 C)  TempSrc:  Oral Oral   SpO2:  97% 100% (!) 79%  Weight:      Height: '4\' 11"'$  (1.499 m)        Body mass index is 28.28 kg/m.  Physical Exam   Physical Exam Gen: A&O x4, NAD HEENT: Atraumatic, normocephalic;mucous membranes moist; oropharynx clear, tongue without atrophy or fasciculations. Neck: Supple, trachea midline. Resp: CTAB, no w/r/r CV: RRR, no m/g/r; nml S1 and S2. 2+ symmetric peripheral pulses. Abd: soft/NT/ND; nabs x 4 quad Extrem: Nml bulk; no cyanosis, clubbing, or edema.  Neuro: *MS: A&O x4. Follows multi-step commands.  *Speech: fluid, nondysarthric, able to name and repeat *CN:    I: Deferred   II,III: PERRLA, VFF by confrontation, optic discs sharp   III,IV,VI: EOMI w/o nystagmus, no ptosis   V: Sensation intact from V1 to V3 to LT   VII: Eyelid closure was full.  Smile symmetric.   VIII: Hearing intact to voice   IX,X: Voice normal, palate elevates symmetrically    XI: SCM/trap 5/5 bilat   XII: Tongue protrudes midline, no atrophy or fasciculations   *Motor:   Normal bulk.  No tremor, rigidity or bradykinesia. No pronator drift.    Strength: Dlt Bic Tri WrE WrF FgS Gr HF KnF KnE PlF DoF    Left '5 5 5 5 5 5 5 5 5 5 5 5    '$ Right '5 5 5 5 5 5 5 '$ 4+ 4+ 5 4+ 4+    *Sensory: Intact to light touch,  pinprick, temperature vibration throughout. Symmetric. Propioception intact bilat.  No double-simultaneous extinction.  *Coordination:  Finger-to-nose, heel-to-shin, rapid alternating motions were intact. *Reflexes:  1+ and symmetric throughout without clonus; toes down-going bilat *Gait: deferred  NIHSS = 1  Premorbid mRS = 1   Labs   CBC:  Recent Labs  Lab 06/22/21 1917 06/23/21 0456  WBC 22.1* 17.8*  HGB 14.1 12.4  HCT 43.5 36.9  MCV 88.4 88.5  PLT 307 [id number]    Basic Metabolic Panel:  Lab Results  Component Value Date   NA 132 (L) 06/22/2021   K  3.9 06/22/2021   CO2 20 (L) 06/22/2021   GLUCOSE 245 (H) 06/22/2021   BUN 12 06/22/2021   CREATININE 0.86 06/23/2021   CALCIUM 8.3 (L) 06/22/2021   GFRNONAA >60 06/23/2021   GFRAA >60 11/08/2015   Lipid Panel:  Lab Results  Component Value Date   LDLCALC 164 (H) 06/23/2021   HgbA1c:  Lab Results  Component Value Date   HGBA1C 7.3 (H) 06/23/2021   Urine Drug Screen: No results found for: LABOPIA, COCAINSCRNUR, LABBENZ, AMPHETMU, THCU, LABBARB  Alcohol Level No results found for: ETH   Impression   This is a 70 year old woman with a history of her diabetes, nicotine dependence, anxiety, obstructive sleep apnea, remote ischemic stroke with mild residual right lower extremity weakness who is brought in by EMS overnight after she had a syncopal episode after donating plasma.  She was found to have small acute ischemic stroke L frontoparietal lobe. She has no focal neurologic deficits from this; RLE mild weakness is chronic. Stroke work-up thus far significant for incidental near occlusion of right common carotid artery.  She is currently on a heparin drip awaiting angiogram of the left lower extremity with vascular surgery due to concern for limb ischemia. I reviewed her imaging, her acute strokes are very small. While there is always a risk of hemorrhagic conversion with any stroke (even if not on anticoagulation) it would be reasonable to continue heparin gtt despite the stroke if needed for vascular indication.   Recommendations   - Permissive HTN x48 hrs from sx onset goal BP <220/110. PRN labetalol or hydralazine if BP above these parameters. Avoid oral antihypertensives. - MRA H&N pending for tmrw AM - TTE pending - Pt is on heparin gtt while awaiting LLE angiogram.  - Atorvastatin '80mg'$  - We ask that vascular surgery kindly weigh in on possible outpatient revascularization of her near occluded R common carotid artery - There is no neurologic indication for additional antiplatelet  in the setting of therapeutic anticoagulation.  If anticoagulation is stopped patient should be placed on dual antiplatelet therapy with aspirin 81 mg daily and Plavix 75 mg daily continued until vascular surgery weighs in on possible intervention of her right carotid artery. - q4 hr neuro checks - STAT head CT for any change in neuro exam - Tele - PT/OT/SLP - Stroke education - Amb referral to neurology upon discharge   Will f/u on pending studies   ______________________________________________________________________   Thank you for the opportunity to take part in the care of this patient. If you have any further questions, please contact the neurology consultation attending.  Signed,  Su Monks, MD [facility] [phone]  If 7pm- 7am, please page neurology on call as listed in [place].

## 2021-06-23 NOTE — Evaluation (Signed)
Occupational Therapy Evaluation Patient Details Name: Autumn Arnold MRN: [id number] DOB: 12-22-1950 Today's Date: 06/23/2021    History of Present Illness Autumn Arnold is a 70 y.o. female with medical history significant for DM, nicotine dependence, anxiety, OSA, mild right lower extremity weakness from old stroke, brought in by EMS from the [facility] center where she had a syncopal episode after donating plasma.  She was apparently found in the bathroom attempting to get up from the commode but was unable to do so independently. 06/23/21 MRI brain: Small foci of acute ischemia within the superior left frontal and parietal lobes.   Clinical Impression   Autumn Arnold was seen for OT evaluation this date. Prior to hospital admission, pt was MOD I for mobility using "forearm crutch" as needed. Pt lives in 1 level home c 1-2 STE with husband who pt states requires assist, pt endorses driving. Currently pt reporting symptoms have resolved. Pt demonstrates near baseline independence to perform ADL and mobility tasks. No skilled OT needs identified. Will sign off. Please re-consult if additional OT needs arise.     Follow Up Recommendations  No OT follow up    Equipment Recommendations  None recommended by OT    Recommendations for Other Services       Precautions / Restrictions Precautions Precautions: Fall Restrictions Weight Bearing Restrictions: No      Mobility Bed Mobility Overal bed mobility: Modified Independent             General bed mobility comments: HOB elevated    Transfers Overall transfer level: Modified independent Equipment used: Rolling walker (2 wheeled) Transfers: Sit to/from Stand Sit to Stand: Modified independent (Device/Increase time)         General transfer comment: MOD I + RW , supervision w/o AD    Balance Overall balance assessment: Needs assistance Sitting-balance support: No upper extremity supported;Feet supported Sitting balance-Leahy  Scale: Good     Standing balance support: No upper extremity supported;During functional activity Standing balance-Leahy Scale: Good Standing balance comment: requires single UE support reaching outside BOS, able to reach to knee high cabinets w/o LOBs                           ADL either performed or assessed with clinical judgement   ADL Overall ADL's : Needs assistance/impaired                                       General ADL Comments: SUPERVISION for toielt t/f and standing hand washing - initial RW use with no LOBs, trialed no AD and minor balance deficits noted, no LOBs.      Pertinent Vitals/Pain Pain Assessment: No/denies pain     Hand Dominance Right   Extremity/Trunk Assessment Upper Extremity Assessment Upper Extremity Assessment: Overall WFL for tasks assessed (mildly decreased time for 5 digit opposition L vs R)   Lower Extremity Assessment Lower Extremity Assessment: Overall WFL for tasks assessed       Communication Communication Communication: No difficulties   Cognition Arousal/Alertness: Awake/alert Behavior During Therapy: WFL for tasks assessed/performed Overall Cognitive Status: Within Functional Limits for tasks assessed                                     General Comments  blood  noted in stool, RN notified    Exercises Exercises: Other exercises Other Exercises Other Exercises: Pt educated re; OT role, DME recs, d/c recs, falls prevention, ECS Other Exercises: LBD, toileting, hand washing, sup>sit, sit<>stand x2, sitting/standing balance/tolerance, functional reach   Shoulder Instructions      Home Living Family/patient expects to be discharged to:: Private residence Living Arrangements: Spouse/significant other Available Help at Discharge: Family;Available PRN/intermittently Type of Home: House Home Access: Stairs to enter [employer] of Steps: 1-2   Home Layout: One level                Home Equipment: Crutches          Prior Functioning/Environment Level of Independence: Independent        Comments: pt reports using forearm crutch PRN. Pt drives, completes groceries, etc        OT Problem List: Decreased activity tolerance;Decreased safety awareness         OT Goals(Current goals can be found in the care plan section) Acute Rehab OT Goals Patient Stated Goal: to go home OT Goal Formulation: With patient Time For Goal Achievement: 07/07/21 Potential to Achieve Goals: Good   AM-PAC OT "6 Clicks" Daily Activity     Outcome Measure Help from another person eating meals?: None Help from another person taking care of personal grooming?: None Help from another person toileting, which includes using toliet, bedpan, or urinal?: A Little Help from another person bathing (including washing, rinsing, drying)?: None Help from another person to put on and taking off regular upper body clothing?: None Help from another person to put on and taking off regular lower body clothing?: A Little 6 Click Score: 22   End of Session Equipment Utilized During Treatment: Rolling walker Nurse Communication: Mobility status  Activity Tolerance: Patient tolerated treatment well Patient left: in chair;with call bell/phone within reach;with chair alarm set  OT Visit Diagnosis: Other abnormalities of gait and mobility (R26.89)                [id number] OT Time Calculation (min): 19 min Charges:  OT General Charges $OT Visit: 1 Visit OT Evaluation $OT Eval Low Complexity: 1 Low OT Treatments $Self Care/Home Management : 8-22 mins  Dessie Coma, M.S. OTR/L  06/23/21, 11:40 AM  ascom [phone]

## 2021-06-23 NOTE — ED Notes (Signed)
Dr. Duncan at bedside 

## 2021-06-23 NOTE — Progress Notes (Signed)
Pt reports she has 10/10 pain in abdomen and "buttock"  Pt states she feels like "my bottom is going to fall out"    Per order, pt is NPO; has only PO meds ordered for pain.  Sent secure message to Dr. Damita Dunnings diet and PO pain meds.   Pt passed a small BM that was loose, and noted bright red blood in BM.  Notification to Dr. Damita Dunnings as well

## 2021-06-23 NOTE — OR Nursing (Signed)
No procedural antibiotic ordered since pt on round clock antibiotics per Dr Delana Meyer

## 2021-06-23 NOTE — Progress Notes (Signed)
Inpatient Diabetes Program Recommendations  AACE/ADA: New Consensus Statement on Inpatient Glycemic Control (2015)  Target Ranges:  Prepandial:   less than 140 mg/dL      Peak postprandial:   less than 180 mg/dL (1-2 hours)      Critically ill patients:  140 - 180 mg/dL   Lab Results  Component Value Date   GLUCAP 139 (H) 06/23/2021   HGBA1C 7.3 (H) 06/23/2021    Review of Glycemic Control Results for Autumn Arnold, Autumn Arnold (MRN [id number]) as of 06/23/2021 12:40  Ref. Range 06/23/2021 01:05 06/23/2021 03:55 06/23/2021 08:47 06/23/2021 11:16  Glucose-Capillary Latest Ref Range: 70 - 99 mg/dL 186 (H) 135 (H) 145 (H) 139 (H)   Diabetes history: DM 2 Outpatient Diabetes medications:  Amaryl 4 mg with breakfast, Glucotrol 20 mg bid, Metformin 1000 mg bid Current orders for Inpatient glycemic control:  Novolog moderate q 4 hours  Inpatient Diabetes Program Recommendations:    Agree with current orders.  It appears patient has 2 sulfonylureas listed on medication reconciliation. Need to d/c either Glucotrol or Amaryl at discharge.   Thanks  Adah Perl, RN, BC-ADM Inpatient Diabetes Coordinator Pager [phone]  (8a-5p)

## 2021-06-23 NOTE — H&P (View-Only) (Signed)
[place] [facility] Consult[facility]   MRN : [id number]  Autumn Arnold is a 70 y.o. (12-31-1950) female who presents with chief complaint of left foot pain.  History of Present Illness:   I am asked to evaluate the patient by Dr. Imagene Riches.  The patient presented to the emergency room with multiple symptoms.  Apparently she had an episode of syncope ultimately resulting in her being brought in by EMS.  Once in the emergency room she was complaining of significant left lower extremity pain.  Physical examination suggest atherosclerotic occlusive disease with rest pain given her cool foot paleness and slow capillary refill his leg as well as lack of pedal pulses.  As an associated symptom she also had a markedly elevated white count as well as marked acidosis.  She also notes she has been having some diarrhea that may have been blood-tinged.  On questioning her today she admits that she does not really eat much not really sure why and she has been losing weight.  And then toward the end of the conversation states it "is that why my stomach is always hurting".  CT angiogram was obtained which I personally reviewed and it clearly shows a 90% stenosis of the SMA as well as a 90% stenosis of the infrarenal abdominal aortic aneurysm in association with greater than 70% bilateral common iliac artery stenosis.  Current Meds  Medication Sig   busPIRone (BUSPAR) 5 MG tablet Take 5 mg by mouth 2 (two) times daily.    Past Medical History:  Diagnosis Date   Anxiety    Arthritis    Asthma    Bilateral lower extremity edema    COPD (chronic obstructive pulmonary disease) (HCC)    Cough    CHRONIC   Diabetes mellitus 2008   Edema    LEGS/FEET   GERD (gastroesophageal reflux disease)    Hyperlipidemia    Hypertension    Shortness of breath dyspnea    DOE   Sleep apnea    CPAP at [facility], Dr. Humphrey Rolls   Stroke [facility]) 02/2010   headache, left arm numbness   Wheezing     Past Surgical History:   Procedure Laterality Date   ABDOMINAL HYSTERECTOMY     ANTERIOR FUSION CERVICAL SPINE     CARPAL TUNNEL RELEASE     Bilateral   CATARACT EXTRACTION W/PHACO Left 03/12/2016   Procedure: CATARACT EXTRACTION PHACO AND INTRAOCULAR LENS PLACEMENT ([place]);  Surgeon: Estill Cotta, MD;  Location: [facility] ORS;  Service: Ophthalmology;  Laterality: Left;  [place] 01:12 AP% 23.4 CDE 29.83 fluid pack lot # [id number]   CATARACT EXTRACTION W/PHACO Right 04/16/2016   Procedure: CATARACT EXTRACTION PHACO AND INTRAOCULAR LENS PLACEMENT ([facility]);  Surgeon: Estill Cotta, MD;  Location: [facility] ORS;  Service: Ophthalmology;  Laterality: Right;  [place] 01:17 AP% 22.5 CDE 35.08 Fluid pack lot # [id number]   CESAREAN SECTION     COLONOSCOPY WITH PROPOFOL N/A 08/12/2018   Procedure: COLONOSCOPY WITH PROPOFOL;  Surgeon: Toledo, Benay Pike, MD;  Location: [facility] ENDOSCOPY;  Service: Gastroenterology;  Laterality: N/A;   ELBOW SURGERY     Tendonitis   ESOPHAGOGASTRODUODENOSCOPY (EGD) WITH PROPOFOL N/A 08/12/2018   Procedure: ESOPHAGOGASTRODUODENOSCOPY (EGD) WITH PROPOFOL;  Surgeon: Toledo, Benay Pike, MD;  Location: [facility] ENDOSCOPY;  Service: Gastroenterology;  Laterality: N/A;   INCONTINENCE SURGERY     TONSILLECTOMY      Social History Social History   Tobacco Use   Smoking status: Every Day    Packs/day: 1.00  Types: Cigarettes   Smokeless tobacco: Never  Substance Use Topics   Alcohol use: Yes    Alcohol/week: 7.0 standard drinks    Types: 7 Cans of beer per week   Drug use: No    Family History Family History  Problem Relation Age of Onset   CAD Mother    Hypertension Mother    Arthritis Mother    Diabetes Father    CAD Father     Allergies  Allergen Reactions   Lisinopril      REVIEW OF SYSTEMS (Negative unless checked)  Constitutional: '[]'$ Weight loss  '[]'$ Fever  '[]'$ Chills Cardiac: '[]'$ Chest pain   '[]'$ Chest pressure   '[]'$ Palpitations   '[]'$ Shortness of breath when laying flat   '[]'$ Shortness of breath with  exertion. Vascular:  '[]'$ Pain in legs with walking   '[]'$ Pain in legs at rest  '[]'$ History of DVT   '[]'$ Phlebitis   '[]'$ Swelling in legs   '[]'$ Varicose veins   '[]'$ Non-healing ulcers Pulmonary:   '[]'$ Uses home oxygen   '[]'$ Productive cough   '[]'$ Hemoptysis   '[]'$ Wheeze  '[]'$ COPD   '[]'$ Asthma Neurologic:  '[]'$ Dizziness   '[]'$ Seizures   '[]'$ History of stroke   '[]'$ History of TIA  '[]'$ Aphasia   '[]'$ Vissual changes   '[]'$ Weakness or numbness in arm   '[]'$ Weakness or numbness in leg Musculoskeletal:   '[]'$ Joint swelling   '[]'$ Joint pain   '[]'$ Low back pain Hematologic:  '[]'$ Easy bruising  '[]'$ Easy bleeding   '[]'$ Hypercoagulable state   '[]'$ Anemic Gastrointestinal:  '[]'$ Diarrhea   '[]'$ Vomiting  '[]'$ Gastroesophageal reflux/heartburn   '[]'$ Difficulty swallowing. Genitourinary:  '[]'$ Chronic kidney disease   '[]'$ Difficult urination  '[]'$ Frequent urination   '[]'$ Blood in urine Skin:  '[]'$ Rashes   '[]'$ Ulcers  Psychological:  '[]'$ History of anxiety   '[]'$  History of major depression.  Physical Examination  Vitals:   06/23/21 0832 06/23/21 1116 06/23/21 1530 06/23/21 1553  BP: (!) 165/63 (!) 150/82 (!) 160/71 (!) 162/128  Pulse: (!) 102 (!) 104 61   Resp: '15 16 17 19  '$ Temp: 98.2 F (36.8 C) 97.8 F (36.6 C) 98.5 F (36.9 C) 98.7 F (37.1 C)  TempSrc: Oral Oral  Oral  SpO2: 97% 100% (!) 79% 96%  Weight:      Height:       Body mass index is 28.28 kg/m. Gen: WD/WN, NAD Head: [place]/AT, No temporalis wasting.  Ear/Nose/Throat: Hearing grossly intact, nares w/o erythema or drainage Eyes: PER, EOMI, sclera nonicteric.  Neck: Supple, no masses.  No bruit or JVD.  Pulmonary:  Good air movement, no audible wheezing, no use of accessory muscles.  Cardiac: RRR, normal S1, S2, no Murmurs. Vascular:   Left foot is cool to the touch with slow capillary refill and it is pale Vessel Right Left  Radial Palpable Palpable  PT Not palpable Not palpable  DP Not palpable Not palpable  Gastrointestinal: soft, mildly distended.  Mild guarding/ peritoneal signs.  Musculoskeletal: M/S 5/5  throughout.  No visible deformity.  Neurologic: CN 2-12 intact. Pain and light touch intact in extremities.  Symmetrical.  Speech is fluent. Motor exam as listed above. Psychiatric: Judgment intact, Mood & affect appropriate for pt's clinical situation. Dermatologic: No rashes or ulcers noted.  No changes consistent with cellulitis.   CBC Lab Results  Component Value Date   WBC 17.8 (H) 06/23/2021   HGB 12.4 06/23/2021   HCT 36.9 06/23/2021   MCV 88.5 06/23/2021   PLT 208 06/23/2021    BMET    Component Value Date/Time   NA 132 (L) 06/22/2021 2357  K 3.9 06/22/2021 2357   CL 98 06/22/2021 2357   CO2 20 (L) 06/22/2021 2357   GLUCOSE 245 (H) 06/22/2021 2357   BUN 12 06/22/2021 2357   CREATININE 0.86 06/23/2021 0456   CALCIUM 8.3 (L) 06/22/2021 2357   GFRNONAA >60 06/23/2021 0456   GFRAA >60 11/08/2015 0419   Estimated Creatinine Clearance: 50.7 mL/min (by C-G formula based on SCr of 0.86 mg/dL).  COAG Lab Results  Component Value Date   INR 1.0 06/22/2021   INR 0.89 11/07/2015    Radiology CT HEAD WO CONTRAST  Result Date: 06/22/2021 CLINICAL DATA:  Syncope. EXAM: CT HEAD WITHOUT CONTRAST TECHNIQUE: Contiguous axial images were obtained from the base of the skull through the vertex without intravenous contrast. COMPARISON:  November 07, 2015 FINDINGS: Brain: There is mild cerebral atrophy with widening of the extra-axial spaces and ventricular dilatation. There are areas of decreased attenuation within the white matter tracts of the supratentorial brain, consistent with microvascular disease changes. Small chronic bilateral para thalamic lacunar infarcts are seen. Vascular: No hyperdense vessel or unexpected calcification. Skull: Normal. Negative for fracture or focal lesion. Sinuses/Orbits: No acute finding. Other: None. IMPRESSION: 1. No acute intracranial abnormality. 2. Generalized cerebral atrophy with chronic microvascular white matter disease. Electronically Signed   By:  Virgina Norfolk M.D.   On: 06/22/2021 19:40   MR BRAIN WO CONTRAST  Result Date: 06/23/2021 CLINICAL DATA:  Loss of consciousness with leg pain and numbness EXAM: MRI HEAD WITHOUT CONTRAST TECHNIQUE: Multiplanar, multiecho pulse sequences of the brain and surrounding structures were obtained without intravenous contrast. COMPARISON:  None. FINDINGS: Brain: Small foci abnormal diffusion restriction within the superior left frontal and parietal lobes. No acute or chronic hemorrhage. There is multifocal hyperintense T2-weighted signal within the white matter. Generalized volume loss without a clear lobar predilection. The midline structures are normal. Vascular: Major flow voids are preserved. Skull and upper cervical spine: Normal calvarium and skull base. Visualized upper cervical spine and soft tissues are normal. Sinuses/Orbits:No paranasal sinus fluid levels or advanced mucosal thickening. No mastoid or middle ear effusion. Normal orbits. IMPRESSION: 1. Small foci of acute ischemia within the superior left frontal and parietal lobes. No hemorrhage or mass effect. 2. Findings of chronic ischemic microangiopathy and generalized volume loss. Electronically Signed   By: Ulyses Jarred M.D.   On: 06/23/2021 02:47   US Carotid Bilateral (at [facility] and [facility] only)  Result Date: 06/23/2021 CLINICAL DATA:  Syncope EXAM: BILATERAL CAROTID DUPLEX ULTRASOUND TECHNIQUE: Pearline Cables scale imaging, color Doppler and duplex ultrasound were performed of bilateral carotid and vertebral arteries in the neck. COMPARISON:  None. FINDINGS: Criteria: Quantification of carotid stenosis is based on velocity parameters that correlate the residual internal carotid diameter with NASCET-based stenosis levels, using the diameter of the distal internal carotid lumen as the denominator for stenosis measurement. The following velocity measurements were obtained: RIGHT ICA: 75/31 cm/sec CCA: [id number] cm/sec SYSTOLIC ICA/CCA RATIO:  3.8 ECA: 100 cm/sec  LEFT ICA: 190/46 cm/sec CCA: [id number] cm/sec SYSTOLIC ICA/CCA RATIO:  1.2 ECA: 246 cm/sec RIGHT CAROTID ARTERY: There is near occlusion of the proximal right common carotid artery which results in slow/trickle flow/bidirectional flow into the more distal common carotid artery. There is relative elevated velocities seen in the internal carotid artery, consistent with severe proximal stenosis. RIGHT VERTEBRAL ARTERY:  Antegrade flow LEFT CAROTID ARTERY: Diffuse moderate atherosclerotic plaque results in less than 50% stenosis. LEFT VERTEBRAL ARTERY:  Antegrade flow Upper extremity blood pressures: RIGHT:  LEFT: IMPRESSION:  1. There is near occlusion of the proximal right common carotid artery. 2. No findings to suggest hemodynamically significant stenosis in the left carotid circulation by Doppler criteria. Diffuse moderate atherosclerotic plaque burden in the left carotid arteries results in less than 50% stenosis. 3. Antegrade flow in the bilateral vertebral arteries. These results will be called to the ordering clinician or representative by the Radiologist Assistant, and communication documented in the PACS or [employer]. Electronically Signed   By: Albin Felling M.D.   On: 06/23/2021 08:55   CT Angio Chest/Abd/Pel for Dissection W and/or Wo Contrast  Result Date: 06/22/2021 CLINICAL DATA:  Evaluate dissection. Syncope, poor perfusion to bilateral feet and right hand. eval dissection. Smoker/vasculopath. syncopal episode. Pt had urinary incontinence during syncopal episode. Episode was witnessed. EXAM: CT ANGIOGRAPHY CHEST, ABDOMEN AND PELVIS TECHNIQUE: Non-contrast CT of the chest was initially obtained. Multidetector CT imaging through the chest, abdomen and pelvis was performed using the standard protocol during bolus administration of intravenous contrast. Multiplanar reconstructed images and MIPs were obtained and reviewed to evaluate the vascular anatomy. CONTRAST:  177m OMNIPAQUE IOHEXOL 350 MG/ML  SOLN COMPARISON:  CT abdomen pelvis 11/27/2019, CT chest 09/16/2019 FINDINGS: CTA CHEST FINDINGS Cardiovascular: Preferential opacification of the thoracic aorta. No evidence of thoracic aortic aneurysm or dissection. At least moderate atherosclerotic plaque of the thoracic aorta. Normal heart size. No significant pericardial effusion. Four-vessel coronary artery calcifications. The main pulmonary artery is normal in caliber. No central pulmonary embolus. Mediastinum/Nodes: No enlarged mediastinal, hilar, or axillary lymph nodes. Thyroid gland, trachea, and esophagus demonstrate no significant findings. Lungs/Pleura: No focal consolidation. No pulmonary nodule. No pulmonary mass. No pleural effusion. No pneumothorax. Musculoskeletal: No chest wall abnormality. No suspicious lytic or blastic osseous lesions. No acute displaced fracture. Review of the MIP images confirms the above findings. CTA ABDOMEN AND PELVIS FINDINGS VASCULAR Aorta: Severe calcified and noncalcified atherosclerotic plaque. Focal region of moderate to severe stenosis of the infrarenal abdominal aortic caliber extending for approximately 0.5 cm in craniocaudal dimension (8:99, 5:108) due to calcified and noncalcified atherosclerotic plaque. Normal caliber aorta without aneurysm, dissection, vasculitis or significant stenosis. Celiac: Atherosclerotic plaque. Patent without evidence of aneurysm, dissection, vasculitis or significant stenosis. SMA: Atherosclerotic plaque. Patent without evidence of aneurysm, dissection, vasculitis or significant stenosis. Renals: Atherosclerotic plaque. Both renal arteries are patent without evidence of aneurysm, dissection, vasculitis, fibromuscular dysplasia or significant stenosis. IMA: Atherosclerotic plaque. Patent without evidence of aneurysm, dissection, vasculitis or significant stenosis. Inflow: Focal severe stenosis of the proximal right common iliac artery (5:126, 7:75). Similarly focal severe stenosis of  the distal left common iliac artery (8:106, 5:1 30-131). Otherwise patent without evidence of aneurysm, dissection, vasculitis. Veins: No obvious venous abnormality within the limitations of this arterial phase study. Review of the MIP images confirms the above findings. NON-VASCULAR Hepatobiliary: No focal liver abnormality. No gallstones, gallbladder wall thickening, or pericholecystic fluid. No biliary dilatation. Pancreas: No focal lesion. Normal pancreatic contour. No surrounding inflammatory changes. No main pancreatic ductal dilatation. Spleen: Normal in size without focal abnormality. Adrenals/Urinary Tract: No adrenal nodule bilaterally. Bilateral kidneys enhance symmetrically. No hydronephrosis. No hydroureter. The urinary bladder is unremarkable. Stomach/Bowel: Stomach is within normal limits. No evidence of bowel wall thickening or dilatation. The appendix not definitely identified. Lymphatic: No lymphadenopathy. Reproductive: Status post hysterectomy. No adnexal masses. Other: No intraperitoneal free fluid. No intraperitoneal free gas. No organized fluid collection. Musculoskeletal: No abdominal wall hernia or abnormality. No suspicious lytic or blastic osseous lesions. No acute displaced fracture.  Review of the MIP images confirms the above findings. IMPRESSION: 1. No aortic dissection or aneurysm. 2. Severe atherosclerotic plaque with several areas of focal moderate to severe stenosis of the infrarenal abdominal aorta as well as bilateral common iliac arteries. Aortic Atherosclerosis (ICD10-I70.0). 3. Otherwise no acute intrathoracic, intra-abdominal, intrapelvic abnormality. Electronically Signed   By: Iven Finn M.D.   On: 06/22/2021 22:31     Assessment/Plan 1.  Acute mesenteric ischemia likely with a chronic baseline: The constellation of the patient's presenting symptoms and findings supports mesenteric ischemia.  This is a life-threatening condition.  I have wrapped commended  angiography with intervention.  Risks and benefits of been reviewed all questions have been answered patient has agreed to proceed.  2.  Atherosclerotic occlusive disease bilateral lower extremities with rest pain of the left lower extremity: Again the patient is suffering from critical limb ischemia.  She has changes both in the aorta as well as bilateral common iliacs that are consistent with inflow stenosis and this would need to be addressed at the time of SMA intervention and therefore that is the plan that I have discussed with the patient.  My hope is that by reestablishing a more normal inflow the patient's rest pain will be alleviated and we can work to better assess and treat any more distal stenoses at a later date when she is more fully recovered.  3.  Acute CVA: The patient does have 2 small infarcts noted on her MRI consistent with acute CVA.  This would be optimally treated with dual antiplatelet therapy as well as a statin which is the same treatment required for post angioplasty and stent placement.  I will use heparin intraoperatively but this represents a very low risk for hemorrhagic conversion given the brief nature of the heparinization.  Given these findings and the serious and potentially life-threatening situation given her mesenteric ischemia we will move forward today.  4.  Coronary artery disease Continue cardiac and antihypertensive medications as already ordered and reviewed, no changes at this time.  Continue statin as ordered and reviewed, no changes at this time  Nitrates PRN for chest pain    Hortencia Pilar, MD  06/23/2021 4:59 PM

## 2021-06-23 NOTE — Consult Note (Signed)
Pharmacy Antibiotic Note  Autumn Arnold is a 70 y.o. female admitted on 06/22/2021 with  syncopal episode / limb ischemia / sepsis . Pharmacy has been consulted for vancomycin and cefepime dosing. Patient is also ordered metronidazole.   Plan:  Cefepime 2 g IV q12h  Vancomycin 750 mg IV q24h --Calculated AUC: 437, Cmin 11.7 --Daily Scr per protocol --Levels at steady state as clinically indicated  Source of suspected sepsis unknown at this time. Continue to follow work-up  Height: '4\' 11"'$  (149.9 cm) Weight: 63.5 kg (140 lb) IBW/kg (Calculated) : 43.2  Temp (24hrs), Avg:98.1 F (36.7 C), Min:98.1 F (36.7 C), Max:98.1 F (36.7 C)  Recent Labs  Lab 06/22/21 1917 06/22/21 2051 06/22/21 2357  WBC 22.1*  --   --   CREATININE 0.99  --  0.92  LATICACIDVEN 5.6* 6.7*  --     Estimated Creatinine Clearance: 47.4 mL/min (by C-G formula based on SCr of 0.92 mg/dL).    Allergies  Allergen Reactions   Lisinopril     Antimicrobials this admission: Ceftriaxone 8/18 x 1 Cefepime 8/18 >>  Vancomycin 8/18 >>  Metronidazole 8/18 >>   Dose adjustments this admission: N/A  Microbiology results: 8/18 BCx: pending 8/18 UCx: pending   Thank you for allowing pharmacy to be a part of this patient's care.  Benita Gutter 06/23/2021 12:46 AM

## 2021-06-23 NOTE — Progress Notes (Addendum)
Patient ID: Autumn Arnold, female   DOB: [birth year], 70 y.o.   MRN: [id number] [facility] PROGRESS NOTE  Autumn Arnold [id number] DOB: 09/02/1951 DOA: 06/22/2021 PCP: [name], MD  HPI/Subjective: Patient states that she has been having rectal bleeding going on for 6 months.  She donates plasma for money and they could not get enough blood and sent her out.  She passed out at home.  Patient feels weaker on the left side.  CT angio showed quite a bit of blockages in the mesentery and lower extremity and aorta.  MRI of the brain showed 2 acute strokes.  Objective: Vitals:   06/23/21 0832 06/23/21 1116  BP: (!) 165/63 (!) 150/82  Pulse: (!) 102 (!) 104  Resp: 15 16  Temp: 98.2 F (36.8 C) 97.8 F (36.6 C)  SpO2: 97% 100%    Intake/Output Summary (Last 24 hours) at 06/23/2021 1350 Last data filed at 06/23/2021 1311 Gross per 24 hour  Intake 400 ml  Output 600 ml  Net -200 ml   Filed Weights   06/22/21 1914  Weight: 63.5 kg    ROS: Review of Systems  Respiratory:  Negative for shortness of breath.   Cardiovascular:  Negative for chest pain.  Gastrointestinal:  Positive for abdominal pain. Negative for nausea and vomiting.  Neurological:  Positive for focal weakness.  Exam: Physical Exam HENT:     Head: Normocephalic.     Mouth/Throat:     Pharynx: No oropharyngeal exudate.  Eyes:     General: Lids are normal.     Conjunctiva/sclera: Conjunctivae normal.     Pupils: Pupils are equal, round, and reactive to light.  Cardiovascular:     Rate and Rhythm: Normal rate and regular rhythm.     Heart sounds: Normal heart sounds, S1 normal and S2 normal.  Pulmonary:     Breath sounds: Normal breath sounds. No decreased breath sounds, wheezing, rhonchi or rales.  Abdominal:     Palpations: Abdomen is soft.     Tenderness: no abdominal tenderness  Musculoskeletal:     Right lower leg: No swelling.     Left lower leg: No swelling.  Skin:    General: Skin is  warm.     Findings: No rash.  Neurological:     Mental Status: She is alert and oriented to person, place, and time.     Comments: Left side 4+ out of 5 power bilaterally, right side 4+ out of 5 power.      Scheduled Meds:  insulin aspart  0-15 Units Subcutaneous Q4H   nicotine  21 mg Transdermal Daily   Continuous Infusions:  [START ON 06/24/2021] ceFEPime (MAXIPIME) IV     heparin 1,100 Units/hr (06/23/21 1311)   lactated ringers 150 mL/hr at 06/23/21 1143   metronidazole 500 mg (06/23/21 1318)   vancomycin 750 mg (06/23/21 0908)    Assessment/Plan:  Severe atherosclerosis of the mesentery, aorta and lower extremities.  Case discussed with vascular surgery and continue heparin infusion.  Vascular surgery to bring to the vascular lab this evening to get better blood supply to the abdomen and lower extremities.  Patient is a high risk patient secondary to strokes but also high risk for ischemia of the mesentery, aorta and lower extremities if procedure not done. Acute stroke seen on MRI of the brain.  Left frontal and left parietal lobes.  Patient also has right carotid stenosis.  Patient on heparin drip currently.  Neurology to see.  Add Lipitor.  LDL 164 SIRS.  Started on empiric antibiotics by admitting physician. Rectal bleeding secondary to mesenteric ischemia.  Continue heparin for now follow-up CBCs Tobacco abuse on nicotine patch Anxiety depression restart BuSpar, Zoloft Type 2 diabetes mellitus hold oral medications.  On sliding scale insulin        Code Status:     Code Status Orders  (From admission, onward)           Start     Ordered   06/23/21 0027  Full code  Continuous        06/23/21 0028           Code Status History     Date Active Date Inactive Code Status Order ID Comments User Context   11/07/2015 1046 11/08/2015 1716 Full Code [id number]  Loletha Grayer, MD ED      Family Communication: Tried to reach patient's husband on the phone but  phone kept ringing.  Spoke with the patient's sister on the phone Disposition Plan: Status is: Inpatient  Dispo: The patient is from: Home              Anticipated d/c is to: Home              Patient currently being treated for severe mesenteric, aortic and lower extremity ischemia and acute strokes.   Difficult to place patient.  No.  Consultants: Vascular surgery Neurology  Antibiotics: Change antibiotics to Rocephin and Flagyl  Time spent: 29 minutes, case discussed with vascular surgery and neurology.  [place]  [facility] [employer]

## 2021-06-23 NOTE — Consult Note (Signed)
ANTICOAGULATION CONSULT NOTE  Pharmacy Consult for IV Heparin Indication:  PAD  / limb ischemia  Patient Measurements: Heparin Dosing Weight: 56.9 kg  Labs: Recent Labs    06/22/21 1917 06/22/21 2051 06/22/21 2145 06/22/21 2357 06/23/21 0456 06/23/21 1208  HGB 14.1  --   --   --  12.4  --   HCT 43.5  --   --   --  36.9  --   PLT 307  --   --   --  208  --   APTT  --  23*  --   --   --   --   LABPROT  --  12.8  --   --   --   --   INR  --  1.0  --   --   --   --   HEPARINUNFRC  --   --   --   --  0.17* 0.23*  CREATININE 0.99  --   --  0.92 0.86  --   TROPONINIHS 6  --  8  --   --   --      Estimated Creatinine Clearance: 50.7 mL/min (by C-G formula based on SCr of 0.86 mg/dL).   Medical History: Past Medical History:  Diagnosis Date   Anxiety    Arthritis    Asthma    Bilateral lower extremity edema    COPD (chronic obstructive pulmonary disease) ([place])    Cough    CHRONIC   Diabetes mellitus 2008   Edema    LEGS/FEET   GERD (gastroesophageal reflux disease)    Hyperlipidemia    Hypertension    Shortness of breath dyspnea    DOE   Sleep apnea    CPAP at [facility], Dr. Humphrey Rolls   Stroke [facility]) 02/2010   headache, left arm numbness   Wheezing     Medications:  No anticoagulation prior to admission per my chart review. Medication reconciliation is pending  Assessment: Patient is a 70 y/o F with medical history as above who presented to the ED with syncopal episode while donating plasma. She is also endorsing leg pain and numbness. Lactate level elevated. Imaging with "severe atherosclerotic plaque with several areas of focal moderate to severe stenosis of the infrarenal abdominal aorta as well as bilateral common iliac arteries". Pharmacy has been consulted to initiate heparin infusion for PAD / limb ischemia.  Baseline CBC acceptable. Baseline aPTT 23s and INR 1.  0819 0456 HL 0.17, subtherapeutic; 950 un/hr 0819 1208 HL 0.23, subtherapeutic; 1050 un/hr   8/19  Patient went to Vascular lab for angioplasty and stent placement in lower extremity.   Goal of Therapy:  Heparin level 0.3 - 0.5 units/ml (lower goal per discussion with MD given hematochezia) Monitor platelets by anticoagulation protocol: Yes   Plan:  Restart Heparin at previous rate of 1100 units/hr beginning 6 hours after procedure (8/20 00:30) Will check HL 6 hours after restart Daily CBC per protocol while on IV heparin Continue to monitor closely for further rectal bleeding  Paulina Fusi, PharmD, BCPS 06/23/2021 7:13 PM

## 2021-06-24 ENCOUNTER — Inpatient Hospital Stay: Payer: Medicare HMO

## 2021-06-24 ENCOUNTER — Inpatient Hospital Stay
Admit: 2021-06-24 | Discharge: 2021-06-24 | Disposition: A | Discharge status: Home / Self Care | Payer: Medicare HMO | Attending: Vascular Surgery | Admitting: Vascular Surgery

## 2021-06-24 DIAGNOSIS — E785 Hyperlipidemia, unspecified: Secondary | ICD-10-CM

## 2021-06-24 DIAGNOSIS — I69351 Hemiplegia and hemiparesis following cerebral infarction affecting right dominant side: Secondary | ICD-10-CM | POA: Diagnosis not present

## 2021-06-24 DIAGNOSIS — R710 Precipitous drop in hematocrit: Secondary | ICD-10-CM

## 2021-06-24 DIAGNOSIS — H539 Unspecified visual disturbance: Secondary | ICD-10-CM | POA: Diagnosis not present

## 2021-06-24 DIAGNOSIS — I639 Cerebral infarction, unspecified: Secondary | ICD-10-CM | POA: Diagnosis not present

## 2021-06-24 DIAGNOSIS — I70229 Atherosclerosis of native arteries of extremities with rest pain, unspecified extremity: Secondary | ICD-10-CM

## 2021-06-24 DIAGNOSIS — E1169 Type 2 diabetes mellitus with other specified complication: Secondary | ICD-10-CM

## 2021-06-24 DIAGNOSIS — R58 Hemorrhage, not elsewhere classified: Secondary | ICD-10-CM

## 2021-06-24 LAB — HEMOGLOBIN AND HEMATOCRIT, BLOOD
HCT: 23 % — ABNORMAL LOW (ref 36.0–46.0)
HCT: 24.2 % — ABNORMAL LOW (ref 36.0–46.0)
HCT: 27.3 % — ABNORMAL LOW (ref 36.0–46.0)
Hemoglobin: 7.8 g/dL — ABNORMAL LOW (ref 12.0–15.0)
Hemoglobin: 8 g/dL — ABNORMAL LOW (ref 12.0–15.0)
Hemoglobin: 9.2 g/dL — ABNORMAL LOW (ref 12.0–15.0)

## 2021-06-24 LAB — GLUCOSE, CAPILLARY
Glucose-Capillary: 119 mg/dL — ABNORMAL HIGH (ref 70–99)
Glucose-Capillary: 123 mg/dL — ABNORMAL HIGH (ref 70–99)
Glucose-Capillary: 130 mg/dL — ABNORMAL HIGH (ref 70–99)
Glucose-Capillary: 143 mg/dL — ABNORMAL HIGH (ref 70–99)
Glucose-Capillary: 149 mg/dL — ABNORMAL HIGH (ref 70–99)
Glucose-Capillary: 177 mg/dL — ABNORMAL HIGH (ref 70–99)
Glucose-Capillary: 218 mg/dL — ABNORMAL HIGH (ref 70–99)
Glucose-Capillary: 98 mg/dL (ref 70–99)

## 2021-06-24 LAB — BASIC METABOLIC PANEL
Anion gap: 7 (ref 5–15)
BUN: 6 mg/dL — ABNORMAL LOW (ref 8–23)
CO2: 25 mmol/L (ref 22–32)
Calcium: 7.8 mg/dL — ABNORMAL LOW (ref 8.9–10.3)
Chloride: 104 mmol/L (ref 98–111)
Creatinine, Ser: 0.67 mg/dL (ref 0.44–1.00)
GFR, Estimated: >60 mL/min (ref >60)
Glucose, Bld: 121 mg/dL — ABNORMAL HIGH (ref 70–99)
Potassium: 3.5 mmol/L (ref 3.5–5.1)
Sodium: 136 mmol/L (ref 135–145)

## 2021-06-24 LAB — URINE CULTURE: Culture: NO GROWTH

## 2021-06-24 LAB — LACTIC ACID, PLASMA: Lactic Acid, Venous: 1.8 mmol/L (ref 0.5–1.9)

## 2021-06-24 LAB — HEPARIN LEVEL (UNFRACTIONATED): Heparin Unfractionated: 0.13 IU/mL — ABNORMAL LOW (ref 0.30–0.70)

## 2021-06-24 LAB — CBC
HCT: 25.3 % — ABNORMAL LOW (ref 36.0–46.0)
Hemoglobin: 8.5 g/dL — ABNORMAL LOW (ref 12.0–15.0)
MCH: 29.6 pg (ref 26.0–34.0)
MCHC: 33.6 g/dL (ref 30.0–36.0)
MCV: 88.2 fL (ref 80.0–100.0)
Platelets: 159 10*3/uL (ref 150–400)
RBC: 2.87 MIL/uL — ABNORMAL LOW (ref 3.87–5.11)
RDW: 16 % — ABNORMAL HIGH (ref 11.5–15.5)
WBC: 12.5 10*3/uL — ABNORMAL HIGH (ref 4.0–10.5)
nRBC: 0 % (ref 0.0–0.2)

## 2021-06-24 LAB — PROCALCITONIN: Procalcitonin: 0.3 ng/mL

## 2021-06-24 MED ORDER — GADOBUTROL 1 MMOL/ML IV SOLN
6.0000 mL | Freq: Once | INTRAVENOUS | Status: AC | PRN
  Administered 2021-06-24: 7.5 mL via INTRAVENOUS

## 2021-06-24 MED ORDER — IOHEXOL 350 MG/ML SOLN
75.0000 mL | Freq: Once | INTRAVENOUS | Status: AC | PRN
  Administered 2021-06-24: 75 mL via INTRAVENOUS

## 2021-06-24 MED ORDER — SODIUM CHLORIDE 0.9 % IV BOLUS
250.0000 mL | Freq: Once | INTRAVENOUS | Status: AC
  Administered 2021-06-24: 250 mL via INTRAVENOUS

## 2021-06-24 MED ORDER — ASPIRIN EC 81 MG PO TBEC: 81.0000 mg | DELAYED_RELEASE_TABLET | Freq: Every day | ORAL | Status: DC

## 2021-06-24 MED ORDER — MORPHINE SULFATE (PF) 4 MG/ML IV SOLN: 2.0000 mg | INTRAVENOUS | Status: DC | PRN

## 2021-06-24 MED ORDER — METRONIDAZOLE 500 MG PO TABS
500.0000 mg | ORAL_TABLET | Freq: Two times a day (BID) | ORAL | Status: DC
  Administered 2021-06-24 – 2021-06-28 (×8): 500 mg via ORAL
  Filled 2021-06-24 (×9): qty 1

## 2021-06-24 MED ORDER — ASPIRIN EC 81 MG PO TBEC
81.0000 mg | DELAYED_RELEASE_TABLET | Freq: Every day | ORAL | Status: DC
  Administered 2021-06-24: 81 mg via ORAL
  Filled 2021-06-24: qty 1

## 2021-06-24 NOTE — Progress Notes (Addendum)
      Daily Progress Note   Assessment/Planning:   POD #1 s/p SMA PTA+S, Ao PTA+S, B CIA PTA+S  Drop in H/H in setting of recent multiple aortoiliac stents raises probability of bleeding from arteries due to intervention Hospitalist has ordered stat CTA abd/pelvis Further recommendations pending results Serial H/H   Subjective  - 1 Day Post-Op   Mild abd complaints   Objective   Vitals:   06/24/21 0034 06/24/21 0146 06/24/21 0431 06/24/21 0844  BP: 99/60 (!) 108/52 (!) 100/52 122/65  Pulse: (!) 114 (!) 104 99 (!) 102  Resp: '18 16 17 16  '$ Temp: 98.3 F (36.8 C) 98 F (36.7 C) 98.1 F (36.7 C) 97.9 F (36.6 C)  TempSrc: Oral Oral    SpO2: 94% 97% 100% 97%  Weight:      Height:         Intake/Output Summary (Last 24 hours) at 06/24/2021 0910 Last data filed at 06/24/2021 0133 Gross per 24 hour  Intake 483 ml  Output 1150 ml  Net -667 ml    PULM  CTAB  CV  Tachycardiac  GI  Soft, TTP BLQ, no frank echymosis, -G/R  VASC B groins: no obvious hematoma, no PSA  NEURO A&Ox3    Laboratory   CBC CBC Latest Ref Rng & Units 06/24/2021 06/23/2021 06/22/2021  WBC 4.0 - 10.5 K/uL 12.5(H) 17.8(H) 22.1(H)  Hemoglobin 12.0 - 15.0 g/dL 8.5(L) 12.4 14.1  Hematocrit 36.0 - 46.0 % 25.3(L) 36.9 43.5  Platelets 150 - 400 K/uL 159 208 307    BMET    Component Value Date/Time   NA 136 06/24/2021 0729   K 3.5 06/24/2021 0729   CL 104 06/24/2021 0729   CO2 25 06/24/2021 0729   GLUCOSE 121 (H) 06/24/2021 0729   BUN 6 (L) 06/24/2021 0729   CREATININE 0.67 06/24/2021 0729   CALCIUM 7.8 (L) 06/24/2021 0729   GFRNONAA >60 06/24/2021 0729   GFRAA >60 11/08/2015 0419     Adele Barthel, MD, FACS, FSVS Covering for [facility]: [phone]  06/24/2021, 9:10 AM    Addendum  I reviewed the CT abd/pelvis w/ contrast:  - there is some retroperitoneal hematoma around the level of the aortoiliac stents but no obvious active extravasation - Would manage  this conservatively: hold heparin drip for now, serial H/H - the amt of RPH visualize does not account for the significant drop in H/H, raising the possibility her prior H/H were falsely elevated - If her abd sx worsen, may need to get General Surgery opinion in regards to ischemic colitis  Adele Barthel, MD, FACS, FSVS Covering for [facility]: [phone]  06/24/2021, 10:36 AM   Addendum CBC Latest Ref Rng & Units 06/24/2021 06/24/2021 06/23/2021  WBC 4.0 - 10.5 K/uL - 12.5(H) 17.8(H)  Hemoglobin 12.0 - 15.0 g/dL 9.2(L) 8.5(L) 12.4  Hematocrit 36.0 - 46.0 % 27.3(L) 25.3(L) 36.9  Platelets 150 - 400 K/uL - 159 208   - looking at this pt's initial lab of HCT 43.5, I suspect pt was already hemoconcentrated - continue serial H/H - no interventions planned  Adele Barthel, MD, FACS, FSVS Covering for  [facility]: [phone]  06/24/2021, 4:06 PM

## 2021-06-24 NOTE — Progress Notes (Signed)
Neurology Progress Note  S: Multiple stents placed for mesenteric ischemia. Drop in H&H afterwards, CT abd/pelvis showed some RP hematoma around level of aortoiliac stents but no active extravasation. Patient reported to hospitalist lightheadedness and blurry vision. Heparin gtt stopped. STAT head CT revealed no hemorrhagic conversion or other acute findings. On my examination this afternoon exam was stable from yesterday with no new neurologic deficits and patient stated her dizziness had resolved. MRA H&N showed no hemodynamically significant stenoses other than high-grade stenosis vs occlusion of R CCA previously seen on carotid US.   O:   Vitals:   06/24/21 1633 06/24/21 2023  BP: (!) 122/56 112/62  Pulse: 78 99  Resp: 15 15  Temp: 98.5 F (36.9 C) 98.7 F (37.1 C)  SpO2: 99% 95%   Physical Exam Gen: A&O x4, NAD HEENT: Atraumatic, normocephalic;mucous membranes moist; oropharynx clear, tongue without atrophy or fasciculations. Neck: Supple, trachea midline. Resp: CTAB, no w/r/r CV: RRR, no m/g/r; nml S1 and S2. 2+ symmetric peripheral pulses. Abd: soft/NT/ND; nabs x 4 quad Extrem: Nml bulk; no cyanosis, clubbing, or edema.   Neuro: *MS: A&O x4. Follows multi-step commands.  *Speech: fluid, nondysarthric, able to name and repeat *CN:    I: Deferred   II,III: PERRLA, VFF by confrontation, optic discs sharp   III,IV,VI: EOMI w/o nystagmus, no ptosis   V: Sensation intact from V1 to V3 to LT   VII: Eyelid closure was full.  Smile symmetric.   VIII: Hearing intact to voice   IX,X: Voice normal, palate elevates symmetrically    XI: SCM/trap 5/5 bilat   XII: Tongue protrudes midline, no atrophy or fasciculations    *Motor:   Normal bulk.  No tremor, rigidity or bradykinesia. No pronator drift.     Strength: Dlt Bic Tri WrE WrF FgS Gr HF KnF KnE PlF DoF    Left '5 5 5 5 5 5 5 5 5 5 5 5    '$ Right '5 5 5 5 5 5 5 '$ 4+ 4+ 5 4+ 4+      *Sensory: Intact to light touch, pinprick,  temperature vibration throughout. Symmetric. Propioception intact bilat.  No double-simultaneous extinction.  *Coordination:  Finger-to-nose, heel-to-shin, rapid alternating motions were intact. *Reflexes:  1+ and symmetric throughout without clonus; toes down-going bilat *Gait: deferred   NIHSS = 1   Premorbid mRS = 1  A/P: This is a 70 year old woman with a history of her diabetes, nicotine dependence, anxiety, obstructive sleep apnea, remote ischemic stroke with mild residual right lower extremity weakness who is brought in by EMS overnight after she had a syncopal episode after donating plasma.  She was found to have small acute ischemic stroke L frontoparietal lobe. She has no focal neurologic deficits from this; RLE mild weakness is chronic. Stroke work-up thus far significant for incidental near occlusion of right common carotid artery.  Multiple stents placed for mesenteric ischemia with vascular this morning 8/20. Drop in H&H afterwards, CT abd/pelvis showed some RP hematoma around level of aortoiliac stents but no active extravasation.   Recommendations:  - Permissive HTN x48 hrs from sx onset goal BP <220/110. PRN labetalol or hydralazine if BP above these parameters. Avoid oral antihypertensives. After 48 hrs goal is normotension, avoid hypotension.  - TTE pending - Atorvastatin '80mg'$  - We ask that vascular surgery kindly weigh in on possible outpatient revascularization of her near occluded R common carotid artery - Given patient's near-total occlusion R CCA on imaging, ideally she would be on dual  antiplatelet therapy with aspirin 81 mg daily and Plavix 75 mg daily continued until vascular surgery weighs in on possible intervention of her right carotid artery. Given her drop in H&H, would recommend initiation ASA '81mg'$  daily when able, f/b DAPT when no further concern for RP bleed. - Stroke education - Amb referral to neurology upon discharge   I will f/u on TTE, other than that no  further neurologic workup indicated as inpatient  Su Monks, MD [facility] [phone]  If [place], please page neurology on call as listed in [place].

## 2021-06-24 NOTE — Progress Notes (Signed)
Patient ID: Autumn Arnold, female   DOB: [birth year], 70 y.o.   MRN: [id number] [facility] PROGRESS NOTE  Autumn Arnold [id number] DOB: 11/08/1950 DOA: 06/22/2021 PCP: [name], MD  HPI/Subjective: Patient seen early morning and had lower abdominal pain when I was palpating.  No nausea or vomiting.  Also had drop in hemoglobin from yesterday to this morning.  Stat CT scan was ordered for retroperitoneal bleed.  Heparin drip was stopped.  Later this afternoon had some symptoms of weakness and visual disturbance.  Objective: Vitals:   06/24/21 1127 06/24/21 1300  BP: (!) 145/70 105/69  Pulse: (!) 110   Resp: 16   Temp: 98.1 F (36.7 C)   SpO2: 99%     Intake/Output Summary (Last 24 hours) at 06/24/2021 1421 Last data filed at 06/24/2021 0133 Gross per 24 hour  Intake 483 ml  Output 550 ml  Net -67 ml   Filed Weights   06/22/21 1914  Weight: 63.5 kg    ROS: Review of Systems  Respiratory:  Negative for shortness of breath.   Cardiovascular:  Negative for chest pain.  Gastrointestinal:  Negative for abdominal pain, nausea and vomiting.  Exam: Physical Exam HENT:     Head: Normocephalic.     Mouth/Throat:     Pharynx: No oropharyngeal exudate.  Eyes:     General: Lids are normal.     Conjunctiva/sclera: Conjunctivae normal.  Cardiovascular:     Rate and Rhythm: Normal rate and regular rhythm.     Heart sounds: Normal heart sounds, S1 normal and S2 normal.  Pulmonary:     Breath sounds: Normal breath sounds. No decreased breath sounds, wheezing, rhonchi or rales.  Abdominal:     Palpations: Abdomen is soft.     Tenderness: There is generalized abdominal tenderness. There is guarding.  Musculoskeletal:     Right lower leg: No swelling.     Left lower leg: No swelling.  Skin:    General: Skin is warm.     Findings: No rash.  Neurological:     Mental Status: She is alert and oriented to person, place, and time.      Scheduled Meds:  (feeding  supplement) PROSource Plus  30 mL Oral TID BM   amitriptyline  100 mg Oral QHS   aspirin EC  81 mg Oral Daily   atorvastatin  80 mg Oral QPM   busPIRone  5 mg Oral BID   feeding supplement (GLUCERNA SHAKE)  237 mL Oral TID BM   insulin aspart  0-15 Units Subcutaneous Q4H   montelukast  10 mg Oral QHS   multivitamin with minerals  1 tablet Oral Daily   nicotine  21 mg Transdermal Daily   pantoprazole  40 mg Oral Daily   sertraline  100 mg Oral Daily   sodium chloride flush  3 mL Intravenous Q12H   Continuous Infusions:  sodium chloride     cefTRIAXone (ROCEPHIN)  IV     lactated ringers 50 mL/hr at 06/23/21 2200   metronidazole 500 mg (06/24/21 0524)    Assessment/Plan:  Retroperitoneal bleed with abdominal pain.  Held heparin drip this morning.  Case discussed with Dr. Bridgett Larsson vascular surgery.  Full liquid diet for now.  Patient possible Drop in hemoglobin.  Repeat hemoglobin a little bit higher at 9.2.  We will hold off on giving blood at this point. Visual symptoms and weakness.  Stat CT scan of the head did not show any bleeding or stroke.  Aspirin given but hesitant with retroperitoneal bleed. Severe atherosclerosis of mesentery, aorta on lower extremities.  Vascular surgery did a stent of the superior mesenteric artery, aorta, bilateral iliacs.  Heparin drip held with retroperitoneal bleed. Acute stroke seen on MRI of the brain left frontal and left parietal area.  Repeat CT scan of the head with her symptoms today negative.  Ordered aspirin but has admitted with retroperitoneal bleed.  Lipitor prescribed for LDL 164. SIRS.  Antibiotics started by admitting physician and clarify need to continue.  CT scan consistent with colitis. Rectal bleeding secondary to mesenteric ischemia.  Holding heparin drip at this point with retroperitoneal bleed. Tobacco abuse on nicotine patch Anxiety depression on BuSpar and Zoloft Type 2 diabetes mellitus with hyperlipidemia.  Hold oral medications  and sliding scale insulin.        Code Status:     Code Status Orders  (From admission, onward)           Start     Ordered   06/23/21 0027  Full code  Continuous        06/23/21 0028           Code Status History     Date Active Date Inactive Code Status Order ID Comments User Context   11/07/2015 1046 11/08/2015 1716 Full Code [id number]  Loletha Grayer, MD ED      Family Communication: Spoke with daughter at the bedside Disposition Plan: Status is: Inpatient  Dispo: The patient is from: Home              Anticipated d/c is to: Home versus rehab depending on clinical course              Patient currently with retroperitoneal bleed after procedure yesterday.  Watch hemoglobin closely.   Difficult to place patient.  No.  Consultants: Vascular surgery  Procedures: Vascular surgery procedure with stents to the superior mesenteric artery, aorta and bilateral iliacs  Antibiotics: Rocephin and Flagyl  Time spent: 34 minutes.  Case discussed with neurology and vascular surgery.  [place]  [facility] [employer]

## 2021-06-24 NOTE — Progress Notes (Signed)
Pt SPO2 90% on RA while asleep.  Per order, SPO2 goal > 94%.  Applied 2L [place] per order  SPO2 97%

## 2021-06-24 NOTE — Progress Notes (Signed)
*  PRELIMINARY RESULTS* Echocardiogram 2D Echocardiogram has been performed.  Autumn Arnold 06/24/2021, 12:03 PM

## 2021-06-24 NOTE — Progress Notes (Signed)
Physical Therapy Treatment Patient Details Name: Autumn Arnold MRN: [id number] DOB: 1951/05/05 Today's Date: 06/24/2021    History of Present Illness 70 y.o. female with medical history significant for DM, nicotine dependence, anxiety, OSA, mild right lower extremity weakness from old stroke, brought in by EMS from the plasma center where she had a syncopal episode after donating plasma.  She was apparently found in the bathroom attempting to get up from the commode but was unable to do so independently. 06/23/21 MRI brain: Small foci of acute ischemia within the superior left frontal and parietal lobes.    PT Comments    Pt seen for PT tx with daughter present for session. Pt c/o "25" out of 10 lower abdominal pain & requires use of RW for mobility for pain management. Pt is able to ambulate into hallway but less distance than yesterday as pt is limited by pain & fatigue. Pt also endorses LLE feeling like it's "giving out" during gait but no buckling noted. Pt requested to rest after ambulation. Will continue to follow to address stairs & gait prior to return home.     Follow Up Recommendations  Home health PT;Supervision - Intermittent     Equipment Recommendations  None recommended by PT    Recommendations for Other Services       Precautions / Restrictions Precautions Precautions: Fall Restrictions Weight Bearing Restrictions: No    Mobility  Bed Mobility Overal bed mobility: Modified Independent             General bed mobility comments: supine>sit with HOB elevated    Transfers Overall transfer level: Needs assistance Equipment used: Rolling walker (2 wheeled) Transfers: Sit to/from Stand Sit to Stand: Min guard;Supervision         General transfer comment: cuing for safe hand placement to push to standing  Ambulation/Gait Ambulation/Gait assistance: Min guard Gait Distance (Feet): 100 Feet Assistive device: Rolling walker (2 wheeled) Gait  Pattern/deviations: Decreased stride length Gait velocity: decreased   General Gait Details: uses RW for pain management   Stairs             Wheelchair Mobility    Modified Rankin (Stroke Patients Only)       Balance Overall balance assessment: Needs assistance Sitting-balance support: No upper extremity supported;Feet supported Sitting balance-Leahy Scale: Good     Standing balance support: No upper extremity supported Standing balance-Leahy Scale: Fair                              Cognition Arousal/Alertness: Awake/alert Behavior During Therapy: WFL for tasks assessed/performed Overall Cognitive Status: Within Functional Limits for tasks assessed                                        Exercises      General Comments General comments (skin integrity, edema, etc.): max HR of 121 bpm during gait      Pertinent Vitals/Pain Pain Assessment: 0-10 Pain Score: 10-Worst pain ever Pain Location: lower abdomen Pain Descriptors / Indicators: Guarding Pain Intervention(s): Monitored during session;Limited activity within patient's tolerance    Home Living                      Prior Function            PT Goals (current goals can now be  found in the care plan section) Acute Rehab PT Goals Patient Stated Goal: decreased pain PT Goal Formulation: With patient Time For Goal Achievement: 07/07/21 Potential to Achieve Goals: Good Progress towards PT goals: Progressing toward goals    Frequency    7X/week      PT Plan Current plan remains appropriate    Co-evaluation              AM-PAC PT "6 Clicks" Mobility   Outcome Measure  Help needed turning from your back to your side while in a flat bed without using bedrails?: None Help needed moving from lying on your back to sitting on the side of a flat bed without using bedrails?: None Help needed moving to and from a bed to a chair (including a wheelchair)?:  None Help needed standing up from a chair using your arms (e.g., wheelchair or bedside chair)?: None Help needed to walk in hospital room?: A Little Help needed climbing 3-5 steps with a railing? : A Lot 6 Click Score: 21    End of Session Equipment Utilized During Treatment: Gait belt Activity Tolerance: Patient limited by pain;Patient limited by fatigue Patient left: in chair;with chair alarm set;with family/visitor present;with call bell/phone within reach Nurse Communication: Mobility status (accidentally pouring out protein drink) PT Visit Diagnosis: Muscle weakness (generalized) (M62.81);Difficulty in walking, not elsewhere classified (R26.2);Pain;Other symptoms and signs involving the nervous system (R29.898) Pain - part of body:  (abdomen)     Time: [id number] PT Time Calculation (min) (ACUTE ONLY): 16 min  Charges:  $Therapeutic Activity: 8-22 mins                     Lavone Nian, PT, DPT 06/24/21, 11:59 AM    Waunita Schooner 06/24/2021, 11:57 AM

## 2021-06-25 DIAGNOSIS — R58 Hemorrhage, not elsewhere classified: Secondary | ICD-10-CM | POA: Diagnosis not present

## 2021-06-25 DIAGNOSIS — K625 Hemorrhage of anus and rectum: Secondary | ICD-10-CM | POA: Diagnosis not present

## 2021-06-25 DIAGNOSIS — I998 Other disorder of circulatory system: Secondary | ICD-10-CM | POA: Diagnosis not present

## 2021-06-25 DIAGNOSIS — D62 Acute posthemorrhagic anemia: Secondary | ICD-10-CM | POA: Diagnosis not present

## 2021-06-25 LAB — PREPARE RBC (CROSSMATCH)

## 2021-06-25 LAB — CBC
HCT: 21.4 % — ABNORMAL LOW (ref 36.0–46.0)
Hemoglobin: 7.3 g/dL — ABNORMAL LOW (ref 12.0–15.0)
MCH: 30.5 pg (ref 26.0–34.0)
MCHC: 34.1 g/dL (ref 30.0–36.0)
MCV: 89.5 fL (ref 80.0–100.0)
Platelets: 147 10*3/uL — ABNORMAL LOW (ref 150–400)
RBC: 2.39 MIL/uL — ABNORMAL LOW (ref 3.87–5.11)
RDW: 16 % — ABNORMAL HIGH (ref 11.5–15.5)
WBC: 10.6 10*3/uL — ABNORMAL HIGH (ref 4.0–10.5)
nRBC: 0 % (ref 0.0–0.2)

## 2021-06-25 LAB — LACTIC ACID, PLASMA: Lactic Acid, Venous: 0.8 mmol/L (ref 0.5–1.9)

## 2021-06-25 LAB — HEMOGLOBIN AND HEMATOCRIT, BLOOD
HCT: 30.5 % — ABNORMAL LOW (ref 36.0–46.0)
Hemoglobin: 10.5 g/dL — ABNORMAL LOW (ref 12.0–15.0)

## 2021-06-25 LAB — BASIC METABOLIC PANEL
Anion gap: 7 (ref 5–15)
BUN: 7 mg/dL — ABNORMAL LOW (ref 8–23)
CO2: 25 mmol/L (ref 22–32)
Calcium: 7.9 mg/dL — ABNORMAL LOW (ref 8.9–10.3)
Chloride: 107 mmol/L (ref 98–111)
Creatinine, Ser: 0.66 mg/dL (ref 0.44–1.00)
GFR, Estimated: >60 mL/min (ref >60)
Glucose, Bld: 111 mg/dL — ABNORMAL HIGH (ref 70–99)
Potassium: 3.3 mmol/L — ABNORMAL LOW (ref 3.5–5.1)
Sodium: 139 mmol/L (ref 135–145)

## 2021-06-25 LAB — GLUCOSE, CAPILLARY
Glucose-Capillary: 117 mg/dL — ABNORMAL HIGH (ref 70–99)
Glucose-Capillary: 118 mg/dL — ABNORMAL HIGH (ref 70–99)
Glucose-Capillary: 117 mg/dL — ABNORMAL HIGH (ref 70–99)
Glucose-Capillary: 91 mg/dL (ref 70–99)
Glucose-Capillary: 113 mg/dL — ABNORMAL HIGH (ref 70–99)

## 2021-06-25 LAB — PROCALCITONIN: Procalcitonin: 0.21 ng/mL

## 2021-06-25 LAB — ABO/RH: ABO/RH(D): O NEG

## 2021-06-25 MED ORDER — SODIUM CHLORIDE 0.9% IV SOLUTION: Freq: Once | INTRAVENOUS | Status: AC

## 2021-06-25 MED ORDER — INSULIN ASPART 100 UNIT/ML IJ SOLN
0.0000 [IU] | Freq: Three times a day (TID) | INTRAMUSCULAR | Status: DC
  Administered 2021-06-26: 3 [IU] via SUBCUTANEOUS
  Administered 2021-06-26: 15 [IU] via SUBCUTANEOUS
  Filled 2021-06-25 (×2): qty 1

## 2021-06-25 MED ORDER — ACETAMINOPHEN 325 MG PO TABS
650.0000 mg | ORAL_TABLET | Freq: Once | ORAL | Status: AC
  Administered 2021-06-25: 650 mg via ORAL
  Filled 2021-06-25: qty 2

## 2021-06-25 NOTE — Plan of Care (Signed)
  Problem: Education: Goal: Knowledge of secondary prevention will improve Outcome: Progressing

## 2021-06-25 NOTE — Progress Notes (Signed)
Patient ID: Autumn Arnold, female   DOB: 11-19-1950, 70 y.o.   MRN: [id number] [facility] PROGRESS NOTE  Autumn Arnold [id number] DOB: June 26, 1951 DOA: 06/22/2021 PCP: [name], MD  HPI/Subjective: Patient still with abdominal pain.  Asking when she can eat.  Initially came in with syncope and found to have severe peripheral vascular disease s/p 4 stents by vascular surgery.  Yesterday diagnosed with retroperitoneal bleed.  Today's hemoglobin drifted lower and will be transfused a unit of blood.  Objective: Vitals:   06/25/21 1058 06/25/21 1113  BP: 140/70 (!) 151/66  Pulse: 97 95  Resp: 17 17  Temp: 98.1 F (36.7 C) 99 F (37.2 C)  SpO2:      Intake/Output Summary (Last 24 hours) at 06/25/2021 1222 Last data filed at 06/25/2021 1058 Gross per 24 hour  Intake 2159.73 ml  Output 1000 ml  Net 1159.73 ml   Filed Weights   06/22/21 1914  Weight: 63.5 kg    ROS: Review of Systems  Respiratory:  Negative for shortness of breath.   Cardiovascular:  Negative for chest pain.  Gastrointestinal:  Positive for abdominal pain. Negative for nausea and vomiting.  Exam: Physical Exam HENT:     Head: Normocephalic.     Mouth/Throat:     Pharynx: No oropharyngeal exudate.  Eyes:     General: Lids are normal.     Conjunctiva/sclera: Conjunctivae normal.     Pupils: Pupils are equal, round, and reactive to light.  Cardiovascular:     Rate and Rhythm: Normal rate and regular rhythm.     Heart sounds: Normal heart sounds, S1 normal and S2 normal.  Pulmonary:     Breath sounds: No decreased breath sounds, wheezing, rhonchi or rales.  Abdominal:     General: There is distension.     Palpations: Abdomen is soft.     Tenderness: There is generalized abdominal tenderness. There is guarding.  Musculoskeletal:     Right lower leg: No swelling.     Left lower leg: No swelling.  Skin:    General: Skin is warm.     Findings: No rash.  Neurological:     Mental Status: She  is alert and oriented to person, place, and time.      Scheduled Meds:  (feeding supplement) PROSource Plus  30 mL Oral TID BM   amitriptyline  100 mg Oral QHS   atorvastatin  80 mg Oral QPM   busPIRone  5 mg Oral BID   feeding supplement (GLUCERNA SHAKE)  237 mL Oral TID BM   insulin aspart  0-15 Units Subcutaneous Q4H   metroNIDAZOLE  500 mg Oral Q12H   montelukast  10 mg Oral QHS   multivitamin with minerals  1 tablet Oral Daily   nicotine  21 mg Transdermal Daily   pantoprazole  40 mg Oral Daily   sertraline  100 mg Oral Daily   sodium chloride flush  3 mL Intravenous Q12H   Continuous Infusions:  sodium chloride     cefTRIAXone (ROCEPHIN)  IV 2 g (06/24/21 1741)   lactated ringers 50 mL/hr at 06/25/21 1057    Assessment/Plan:  Acute blood loss anemia.  Transfuse 1 unit of packed red blood cells and check a hemoglobin afterwards may need another unit after that.  Retroperitoneal bleed with abdominal pain.  Heparin drip discontinued yesterday.  We will hold off on antiplatelet agents at this point.  Full liquid diet. Severe atherosclerosis of mesentery, aorta and lower extremities.  Vascular surgery did a stent of superior mesenteric artery, aorta and bilateral iliacs.  Holding blood thinners at this point with retroperitoneal bleed and acute blood loss anemia. Acute stroke seen on MRI of the brain and left frontal left parietal area.  Lipitor prescribed.  Holding blood thinners with retroperitoneal bleed Sirs.  Antibiotics ordered by ER physician continuing Rocephin and Flagyl for now.  CT scan consistent with colitis likely ischemic in nature.  Lactic acid normal. Rectal bleeding secondary to mesenteric ischemia Tobacco abuse on nicotine patch Anxiety depression on BuSpar and Zoloft Type 2 diabetes mellitus with hyperlipidemia.  Holding oral diabetic medications.  Lipitor for hyperlipidemia.    Code Status:     Code Status Orders  (From admission, onward)            Start     Ordered   06/23/21 0027  Full code  Continuous        06/23/21 0028           Code Status History     Date Active Date Inactive Code Status Order ID Comments User Context   11/07/2015 1046 11/08/2015 1716 Full Code [id number]  Loletha Grayer, MD ED      Family Communication: spoke with sister and brother at the bedside Disposition Plan: Inpatient  Dispo: The patient is from: Home              Anticipated d/c is to: Home              Patient currently with drop in hemoglobin with retroperitoneal bleed still needs time here in the hospital.  Will likely need to be on blood thinners at some point.   Difficult to place patient.  No.  Consultants: Neurology Vascular surgery  Procedures: Stenting to the SMA, aorta and bilateral iliacs by vascular surgery  Antibiotics: Rocephin and Flagyl  Time spent: 29 minutes  [place]

## 2021-06-25 NOTE — Plan of Care (Signed)
  Problem: Education: Goal: Knowledge of General Education information will improve Description: Including pain rating scale, medication(s)/side effects and non-pharmacologic comfort measures 06/25/2021 1349 by Cristela Blue, RN Outcome: Progressing 06/25/2021 1349 by Cristela Blue, RN Outcome: Progressing   Problem: Health Behavior/Discharge Planning: Goal: Ability to manage health-related needs will improve 06/25/2021 1349 by Cristela Blue, RN Outcome: Progressing 06/25/2021 1349 by Cristela Blue, RN Outcome: Progressing   Problem: Clinical Measurements: Goal: Ability to maintain clinical measurements within normal limits will improve 06/25/2021 1349 by Cristela Blue, RN Outcome: Progressing 06/25/2021 1349 by Cristela Blue, RN Outcome: Progressing Goal: Will remain free from infection 06/25/2021 1349 by Cristela Blue, RN Outcome: Progressing 06/25/2021 1349 by Cristela Blue, RN Outcome: Progressing Goal: Diagnostic test results will improve 06/25/2021 1349 by Cristela Blue, RN Outcome: Progressing 06/25/2021 1349 by Cristela Blue, RN Outcome: Progressing Goal: Respiratory complications will improve 06/25/2021 1349 by Cristela Blue, RN Outcome: Progressing 06/25/2021 1349 by Cristela Blue, RN Outcome: Progressing Goal: Cardiovascular complication will be avoided 06/25/2021 1349 by Cristela Blue, RN Outcome: Progressing 06/25/2021 1349 by Cristela Blue, RN Outcome: Progressing   Problem: Activity: Goal: Risk for activity intolerance will decrease 06/25/2021 1349 by Cristela Blue, RN Outcome: Progressing 06/25/2021 1349 by Cristela Blue, RN Outcome: Progressing   Problem: Nutrition: Goal: Adequate nutrition will be maintained 06/25/2021 1349 by Cristela Blue, RN Outcome: Progressing 06/25/2021 1349 by Cristela Blue, RN Outcome: Progressing   Problem: Coping: Goal: Level of anxiety will decrease 06/25/2021 1349 by Cristela Blue, RN Outcome:  Progressing 06/25/2021 1349 by Cristela Blue, RN Outcome: Progressing   Problem: Elimination: Goal: Will not experience complications related to bowel motility 06/25/2021 1349 by Cristela Blue, RN Outcome: Progressing 06/25/2021 1349 by Cristela Blue, RN Outcome: Progressing Goal: Will not experience complications related to urinary retention 06/25/2021 1349 by Cristela Blue, RN Outcome: Progressing 06/25/2021 1349 by Cristela Blue, RN Outcome: Progressing   Problem: Pain Managment: Goal: General experience of comfort will improve 06/25/2021 1349 by Cristela Blue, RN Outcome: Progressing 06/25/2021 1349 by Cristela Blue, RN Outcome: Progressing   Problem: Safety: Goal: Ability to remain free from injury will improve 06/25/2021 1349 by Cristela Blue, RN Outcome: Progressing 06/25/2021 1349 by Cristela Blue, RN Outcome: Progressing   Problem: Skin Integrity: Goal: Risk for impaired skin integrity will decrease 06/25/2021 1349 by Cristela Blue, RN Outcome: Progressing 06/25/2021 1349 by Cristela Blue, RN Outcome: Progressing   Problem: Education: Goal: Knowledge of secondary prevention will improve 06/25/2021 1349 by Cristela Blue, RN Outcome: Progressing 06/25/2021 1349 by Cristela Blue, RN Outcome: Progressing Goal: Knowledge of patient specific risk factors addressed and post discharge goals established will improve 06/25/2021 1349 by Cristela Blue, RN Outcome: Progressing 06/25/2021 1349 by Cristela Blue, RN Outcome: Progressing Goal: Individualized Educational Video(s) 06/25/2021 1349 by Cristela Blue, RN Outcome: Progressing 06/25/2021 1349 by Cristela Blue, RN Outcome: Progressing

## 2021-06-25 NOTE — Progress Notes (Signed)
Chaplain provided AD materials and education per pt/family request. Chaplain informed them that pt may request a chaplain during business hours to set up notarization process if pt desires to complete paperwork.   Please contact as needed.    Minus [place], [place] Pager: [phone]      06/25/21 1400  Clinical Encounter Type  Visited With Patient and family together  Visit Type Initial (AD education)  Referral From Family  Consult/Referral To Chaplain  Stress Factors  Patient Stress Factors Health changes

## 2021-06-25 NOTE — Progress Notes (Addendum)
      Daily Progress Note   Assessment/Planning:   POD #2 s/p SMA PTA+S, Ao PTA+S, B CIA PTA+S  Doubt further bleeding: suspect serial phlebotomy Transfuse 2 u pRBC given multiple tissue beds with severe arterial disease Will defer question of R CCA intervention to Dr. Ronalee Belts on Monday Pt still having lower quadrant pain: given location of limited RPH on CT, I doubt this is due to [facility].  Rather I would have concerns her abd pain is related to her possible ischemic bowel. Probably will need colonoscopy to evaluate large intestine Subsequently, would hold on loading anti-platelets   Subjective  - 2 Days Post-Op   Still c/o abd pain, no BM currently   Objective   Vitals:   06/24/21 1633 06/24/21 2023 06/25/21 0334 06/25/21 0736  BP: (!) 122/56 112/62 140/66 133/63  Pulse: 78 99 (!) 112 98  Resp: '15 15  18  '$ Temp: 98.5 F (36.9 C) 98.7 F (37.1 C) 98.9 F (37.2 C) 98.1 F (36.7 C)  TempSrc:  Oral    SpO2: 99% 95% 95% 98%  Weight:      Height:         Intake/Output Summary (Last 24 hours) at 06/25/2021 0920 Last data filed at 06/25/2021 0300 Gross per 24 hour  Intake 1789.73 ml  Output 1000 ml  Net 789.73 ml    PULM  CTAB  CV  RR, less tachycardiac  GI  soft, mild distension, BLQ TTP without G/R, some mild echymosis R>L groin  VASC Viable feets without pedal pulse  NEURO CN gross intact, M/S grossly intact with sensation    Laboratory   CBC CBC Latest Ref Rng & Units 06/25/2021 06/24/2021 06/24/2021  WBC 4.0 - 10.5 K/uL 10.6(H) - -  Hemoglobin 12.0 - 15.0 g/dL 7.3(L) 7.8(L) 8.0(L)  Hematocrit 36.0 - 46.0 % 21.4(L) 23.0(L) 24.2(L)  Platelets 150 - 400 K/uL 147(L) - -    BMET    Component Value Date/Time   NA 139 06/25/2021 0644   K 3.3 (L) 06/25/2021 0644   CL 107 06/25/2021 0644   CO2 25 06/25/2021 0644   GLUCOSE 111 (H) 06/25/2021 0644   BUN 7 (L) 06/25/2021 0644   CREATININE 0.66 06/25/2021 0644   CALCIUM 7.9 (L) 06/25/2021 0644   GFRNONAA >60  06/25/2021 0644   GFRAA >60 11/08/2015 0419     Adele Barthel, MD, FACS, FSVS Covering for [facility]: [phone]  06/25/2021, 9:20 AM   Addendum  Looks like pt go her 2 u PRBC.  Awaiting H/H  Adele Barthel, MD, FACS, FSVS Covering for [facility]: [phone]  06/25/2021, 3:32 PM

## 2021-06-25 NOTE — Progress Notes (Signed)
PT Cancellation Note  Patient Details Name: Autumn Arnold MRN: [id number] DOB: Nov 02, 1951   Cancelled Treatment:    Reason Eval/Treat Not Completed: Medical issues which prohibited therapy  Offered session x 2 today.  Declined both attempts.  On second attempt, pt stated she was dizzy at rest, abdominal pain and was awaiting blood transfusion.  Declined session today.  Will continue tomorrow as appropriate.   Chesley Noon 06/25/2021, 10:50 AM

## 2021-06-26 ENCOUNTER — Encounter: Payer: Self-pay | Admitting: Vascular Surgery

## 2021-06-26 ENCOUNTER — Inpatient Hospital Stay: Payer: Medicare HMO

## 2021-06-26 DIAGNOSIS — K625 Hemorrhage of anus and rectum: Secondary | ICD-10-CM | POA: Diagnosis not present

## 2021-06-26 DIAGNOSIS — I998 Other disorder of circulatory system: Secondary | ICD-10-CM | POA: Diagnosis not present

## 2021-06-26 DIAGNOSIS — R55 Syncope and collapse: Secondary | ICD-10-CM | POA: Diagnosis not present

## 2021-06-26 DIAGNOSIS — R58 Hemorrhage, not elsewhere classified: Secondary | ICD-10-CM | POA: Diagnosis not present

## 2021-06-26 DIAGNOSIS — A419 Sepsis, unspecified organism: Secondary | ICD-10-CM | POA: Diagnosis not present

## 2021-06-26 DIAGNOSIS — D62 Acute posthemorrhagic anemia: Secondary | ICD-10-CM | POA: Diagnosis not present

## 2021-06-26 LAB — BPAM RBC
Blood Product Expiration Date: 202209032359
ISSUE DATE / TIME: 202208211049
Unit Type and Rh: 9500

## 2021-06-26 LAB — BASIC METABOLIC PANEL
Anion gap: 4 — ABNORMAL LOW (ref 5–15)
BUN: 9 mg/dL (ref 8–23)
CO2: 27 mmol/L (ref 22–32)
Calcium: 8.1 mg/dL — ABNORMAL LOW (ref 8.9–10.3)
Chloride: 108 mmol/L (ref 98–111)
Creatinine, Ser: 0.67 mg/dL (ref 0.44–1.00)
GFR, Estimated: >60 mL/min (ref >60)
Glucose, Bld: 132 mg/dL — ABNORMAL HIGH (ref 70–99)
Potassium: 4.3 mmol/L (ref 3.5–5.1)
Sodium: 139 mmol/L (ref 135–145)

## 2021-06-26 LAB — GLUCOSE, CAPILLARY
Glucose-Capillary: 120 mg/dL — ABNORMAL HIGH (ref 70–99)
Glucose-Capillary: 155 mg/dL — ABNORMAL HIGH (ref 70–99)
Glucose-Capillary: 454 mg/dL — ABNORMAL HIGH (ref 70–99)
Glucose-Capillary: 46 mg/dL — ABNORMAL LOW (ref 70–99)
Glucose-Capillary: 59 mg/dL — ABNORMAL LOW (ref 70–99)
Glucose-Capillary: 121 mg/dL — ABNORMAL HIGH (ref 70–99)

## 2021-06-26 LAB — CBC
HCT: 27.2 % — ABNORMAL LOW (ref 36.0–46.0)
Hemoglobin: 9.1 g/dL — ABNORMAL LOW (ref 12.0–15.0)
MCH: 29.8 pg (ref 26.0–34.0)
MCHC: 33.5 g/dL (ref 30.0–36.0)
MCV: 89.2 fL (ref 80.0–100.0)
Platelets: 158 10*3/uL (ref 150–400)
RBC: 3.05 MIL/uL — ABNORMAL LOW (ref 3.87–5.11)
RDW: 15.7 % — ABNORMAL HIGH (ref 11.5–15.5)
WBC: 10 10*3/uL (ref 4.0–10.5)
nRBC: 0 % (ref 0.0–0.2)

## 2021-06-26 LAB — TYPE AND SCREEN
ABO/RH(D): O NEG
Antibody Screen: NEGATIVE
Unit division: 0

## 2021-06-26 LAB — LACTIC ACID, PLASMA: Lactic Acid, Venous: 1.7 mmol/L (ref 0.5–1.9)

## 2021-06-26 MED ORDER — INSULIN GLARGINE-YFGN 100 UNIT/ML ~~LOC~~ SOLN
12.0000 [IU] | Freq: Every day | SUBCUTANEOUS | Status: DC
  Administered 2021-06-26: 12 [IU] via SUBCUTANEOUS
  Filled 2021-06-26: qty 0.12

## 2021-06-26 MED ORDER — HYDROCODONE-ACETAMINOPHEN 5-325 MG PO TABS
1.0000 | ORAL_TABLET | ORAL | Status: DC | PRN
  Administered 2021-06-26 – 2021-06-28 (×6): 2 via ORAL
  Filled 2021-06-26 (×6): qty 2

## 2021-06-26 MED ORDER — SIMETHICONE 80 MG PO CHEW
160.0000 mg | CHEWABLE_TABLET | Freq: Four times a day (QID) | ORAL | Status: DC | PRN
  Administered 2021-06-27 – 2021-06-28 (×4): 160 mg via ORAL
  Filled 2021-06-26 (×6): qty 2

## 2021-06-26 MED ORDER — INSULIN ASPART 100 UNIT/ML IJ SOLN: 5.0000 [IU] | Freq: Three times a day (TID) | INTRAMUSCULAR | Status: DC

## 2021-06-26 MED ORDER — IOHEXOL 350 MG/ML SOLN
75.0000 mL | Freq: Once | INTRAVENOUS | Status: AC | PRN
  Administered 2021-06-26: 75 mL via INTRAVENOUS

## 2021-06-26 NOTE — Progress Notes (Signed)
Patient ID: Autumn Arnold, female   DOB: February 21, 1951, 70 y.o.   MRN: [id number] [facility] PROGRESS NOTE  Autumn Arnold [id number] DOB: 04-Sep-1951 DOA: 06/22/2021 PCP: [name], MD  HPI/Subjective: Patient still having abdominal pain.  Received a unit of packed red blood cells yesterday with good response.  No nausea or vomiting.  Wanting to advance diet.  Still having blood in the bowel movements.  Objective: Vitals:   06/26/21 1156 06/26/21 1626  BP: 134/63 128/80  Pulse: 83 99  Resp: 17 18  Temp: 97.9 F (36.6 C) 97.8 F (36.6 C)  SpO2: 97% 99%    Intake/Output Summary (Last 24 hours) at 06/26/2021 1755 Last data filed at 06/26/2021 1345 Gross per 24 hour  Intake 1240.17 ml  Output 0 ml  Net 1240.17 ml   Filed Weights   06/22/21 1914  Weight: 63.5 kg    ROS: Review of Systems  Respiratory:  Negative for shortness of breath.   Cardiovascular:  Negative for chest pain.  Gastrointestinal:  Positive for abdominal pain, blood in stool and diarrhea. Negative for nausea and vomiting.  Exam: Physical Exam HENT:     Head: Normocephalic.     Mouth/Throat:     Pharynx: No oropharyngeal exudate.  Eyes:     General: Lids are normal.     Conjunctiva/sclera: Conjunctivae normal.     Pupils: Pupils are equal, round, and reactive to light.  Cardiovascular:     Rate and Rhythm: Normal rate and regular rhythm.     Heart sounds: Normal heart sounds, S1 normal and S2 normal.  Pulmonary:     Breath sounds: No decreased breath sounds, wheezing, rhonchi or rales.  Abdominal:     Palpations: Abdomen is soft.     Tenderness: There is generalized abdominal tenderness.  Musculoskeletal:     Right lower leg: No swelling.     Left lower leg: No swelling.  Skin:    General: Skin is warm.     Findings: No rash.  Neurological:     Mental Status: She is alert and oriented to person, place, and time.      Scheduled Meds:  (feeding supplement) PROSource Plus  30 mL  Oral TID BM   amitriptyline  100 mg Oral QHS   atorvastatin  80 mg Oral QPM   busPIRone  5 mg Oral BID   feeding supplement (GLUCERNA SHAKE)  237 mL Oral TID BM   insulin aspart  0-15 Units Subcutaneous TID WC   [START ON 06/27/2021] insulin aspart  5 Units Subcutaneous TID WC   insulin glargine-yfgn  12 Units Subcutaneous QHS   metroNIDAZOLE  500 mg Oral Q12H   montelukast  10 mg Oral QHS   multivitamin with minerals  1 tablet Oral Daily   nicotine  21 mg Transdermal Daily   pantoprazole  40 mg Oral Daily   sertraline  100 mg Oral Daily   sodium chloride flush  3 mL Intravenous Q12H   Continuous Infusions:  sodium chloride     cefTRIAXone (ROCEPHIN)  IV Stopped (06/25/21 1745)    Assessment/Plan:  Acute blood loss anemia.  Transfuse 1 unit of packed red blood cells yesterday on a hemoglobin of 7.3.  Hemoglobin up to 9.1 today.  Continue to monitor.  Recheck hemoglobin tomorrow morning. Retroperitoneal bleed with abdominal pain.  Holding blood thinners at this point.  Advance diet see how she does. Limb threatening severe atherosclerosis of mesentery, aorta or lower extremities.  Vascular surgery placed  stents and superior mesenteric artery, aorta and bilateral iliacs.  Holding blood thinners at this point. Acute stroke seen on MRI of the brain left frontal and left parietal area.  Lipitor prescribed.  Holding blood thinners right now with retroperitoneal bleed. Systemic inflammatory response syndrome.  CT scan consistent with colitis likely ischemic in nature on empiric Rocephin and Flagyl.  Lactic acid normal. Rectal bleeding likely secondary to mesenteric ischemia will need colonoscopy at some point. Tobacco abuse on nicotine patch Anxiety depression on BuSpar and Zoloft Type 2 diabetes mellitus with hyperlipidemia continue Lipitor.  Start low-dose Lantus insulin and sliding scale insulin.  Sugars jumped up today with restarting diet.     Code Status:     Code Status Orders   (From admission, onward)           Start     Ordered   06/26/21 1103  Do not attempt resuscitation (DNR)  Continuous       Question Answer Comment  In the event of cardiac or respiratory ARREST Do not call a "code blue"   In the event of cardiac or respiratory ARREST Do not perform Intubation, CPR, defibrillation or ACLS   In the event of cardiac or respiratory ARREST Use medication by any route, position, wound care, and other measures to relive pain and suffering. May use oxygen, suction and manual treatment of airway obstruction as needed for comfort.   Comments nurse may pronounce      06/26/21 1103           Code Status History     Date Active Date Inactive Code Status Order ID Comments User Context   06/23/2021 0028 06/26/2021 1103 Full Code [id number]  Athena Masse, MD ED   11/07/2015 1046 11/08/2015 1716 Full Code [id number]  Loletha Grayer, MD ED      Family Communication: Spoke with daughter on the phone and sister at the bedside Disposition Plan: Status is: Inpatient  Dispo: The patient is from: Home              Anticipated d/c is to: To be determined              Patient currently being watched closely for acute blood loss anemia with retroperitoneal bleed and severe peripheral vascular disease and strokes   Difficult to place patient.  No.  Consultants: Vascular surgery Neurology  Procedures: Stent due to SMA, aorta and bilateral iliacs by vascular surgery  Antibiotics: Rocephin Flagyl  Time spent: 27 minutes, case discussed with vascular surgery  Loletha Grayer  [facility]

## 2021-06-26 NOTE — Progress Notes (Signed)
PT Cancellation Note  Patient Details Name: Autumn Arnold MRN: [id number] DOB: 1951-08-03   Cancelled Treatment:    Reason Eval/Treat Not Completed: Pain limiting ability to participate  Offered session.  Pt stated she continues to have stomach pain.  Endorses back pain too today.  Offered mobility/OOB to assist with back pain and comfort but she declined stating she did not feel up to it today.  Pt told to let nursing know if she changed her mind regarding therapy today.  If not, we will continue at a later time/date.    Chesley Noon 06/26/2021, 10:28 AM

## 2021-06-26 NOTE — Progress Notes (Signed)
[facility] Daily Progress Note  06/23/21:             1.  Abdominal aortogram             2.  Introduction catheter into SMA             3.  Percutaneous transluminal angioplasty and stent placement with a 7 x 16 lifestream stent superior mesenteric artery.             4.  Percutaneous transluminal angioplasty and stent placement using a 42m x 579mlifestream stent mid infrarenal aorta             5.  Percutaneous transluminal angioplasty and stent placement right common iliac artery with 6 mm lifestream stents postdilated to 7 mm; "kissing balloon" technique             4.  Percutaneous transluminal and plasty and stent placement left common iliac arterywith 6 mm lifestream stents postdilated to 7 mm; "kissing balloon" technique             5.  Ultrasound guided access bilateral common femoral arteries             6.  StarClose closure device bilateral common femoral arteries  Subjective: Patient asking to eat today. Continues to experience BRBPR however notes it is less today. Denies nausea or vomiting.  Objective: Vitals:   06/25/21 2320 06/26/21 0430 06/26/21 0801 06/26/21 1156  BP: 119/67 125/70 110/85 134/63  Pulse: 91 82 93 83  Resp: '17 18 17 17  '$ Temp: 98 F (36.7 C) 98.1 F (36.7 C) 97.6 F (36.4 C) 97.9 F (36.6 C)  TempSrc: Oral Oral Oral   SpO2: 99% 100% 98% 97%  Weight:      Height:        Intake/Output Summary (Last 24 hours) at 06/26/2021 1222 Last data filed at 06/26/2021 1156 Gross per 24 hour  Intake 2075.72 ml  Output 0 ml  Net 2075.72 ml   Physical Exam: A&Ox3, NAD CV: RRR Pulmonary: CTA Bilaterally Abdomen: Soft, Mildly tender to palpation, Mild distention Vascular:  Left Lower Extremity: Thigh soft. Calf soft. Warm distally to toes. No acute vascular compromise.   Right Lower Extremity: Thigh soft. Calf soft. Warm distally to toes. No acute vascular compromise.    Laboratory: CBC    Component Value Date/Time   WBC 10.0  06/26/2021 0500   HGB 9.1 (L) 06/26/2021 0500   HCT 27.2 (L) 06/26/2021 0500   PLT 158 06/26/2021 0500   BMET    Component Value Date/Time   NA 139 06/26/2021 0500   K 4.3 06/26/2021 0500   CL 108 06/26/2021 0500   CO2 27 06/26/2021 0500   GLUCOSE 132 (H) 06/26/2021 0500   BUN 9 06/26/2021 0500   CREATININE 0.67 06/26/2021 0500   CALCIUM 8.1 (L) 06/26/2021 0500   GFRNONAA >60 06/26/2021 0500   GFRAA >60 11/08/2015 0419   Assessment/Planning: The patient is a [age]ear old female who presented with significant atherosclerotic disease to the mesentery and Iliac arteries s/p intervention  1) SMA Stenosis: s/p successful revascularization Patient is asking to eat today - will advance as tolerated OK from vascular standpoint to start ASA '81mg'$  daily and Plavix '75mg'$  daily (no loading as per Schnier) when H&H is stable. Patient is afebrile, stable vitals, no WBC with improving appetite - probability of worsening colonic ischemia is slim. Will continue to watch.    2) PAD: Will  continue workup as outpatient.  ASA / Plavix as above. On Lipitor  3) Carotid Stenosis: Recent acute CVA OK to start ASA / Plavix Will order CTA neck while in house to assess anatomy and degree of stenosis Normal renal function  Discussed with Dr. Eber Hong Zriyah Kopplin PA-C 06/26/2021 12:22 PM

## 2021-06-26 NOTE — Progress Notes (Signed)
At bedside report, patient states that she feels "weird".  [facility] RN reports she received 20 units of insulin, due to BG > than 400.    Will get BG checked.    1930 - Patient BG 46.  Patient asked for OJ and ate some peanut butter   2017 - rechecked BG, 59 Gave patient more juice, graham crackers  2034 - notified provider, Sharion Settler, NP Pt has 12 units of long acting insulin ordered at bedtime, verified order with provider.  Advised by Sharion Settler NP, to give insulin as scheduled and perform BG recheck at 0300

## 2021-06-26 NOTE — Care Management Important Message (Signed)
Important Message  Patient Details  Name: ETTA TAIBI MRN: [id number] Date of Birth: [birth year]   Medicare Important Message Given:  Yes     Dannette Barbara 06/26/2021, 12:59 PM

## 2021-06-27 DIAGNOSIS — A419 Sepsis, unspecified organism: Secondary | ICD-10-CM | POA: Diagnosis not present

## 2021-06-27 DIAGNOSIS — R58 Hemorrhage, not elsewhere classified: Secondary | ICD-10-CM | POA: Diagnosis not present

## 2021-06-27 DIAGNOSIS — Z720 Tobacco use: Secondary | ICD-10-CM | POA: Diagnosis not present

## 2021-06-27 DIAGNOSIS — D62 Acute posthemorrhagic anemia: Secondary | ICD-10-CM | POA: Diagnosis not present

## 2021-06-27 DIAGNOSIS — I998 Other disorder of circulatory system: Secondary | ICD-10-CM | POA: Diagnosis not present

## 2021-06-27 DIAGNOSIS — R55 Syncope and collapse: Secondary | ICD-10-CM | POA: Diagnosis not present

## 2021-06-27 LAB — CBC
HCT: 28.3 % — ABNORMAL LOW (ref 36.0–46.0)
Hemoglobin: 9.3 g/dL — ABNORMAL LOW (ref 12.0–15.0)
MCH: 29 pg (ref 26.0–34.0)
MCHC: 32.9 g/dL (ref 30.0–36.0)
MCV: 88.2 fL (ref 80.0–100.0)
Platelets: 209 10*3/uL (ref 150–400)
RBC: 3.21 MIL/uL — ABNORMAL LOW (ref 3.87–5.11)
RDW: 15.6 % — ABNORMAL HIGH (ref 11.5–15.5)
WBC: 9.6 10*3/uL (ref 4.0–10.5)
nRBC: 0 % (ref 0.0–0.2)

## 2021-06-27 LAB — BASIC METABOLIC PANEL
Anion gap: 7 (ref 5–15)
BUN: 8 mg/dL (ref 8–23)
CO2: 25 mmol/L (ref 22–32)
Calcium: 8.2 mg/dL — ABNORMAL LOW (ref 8.9–10.3)
Chloride: 107 mmol/L (ref 98–111)
Creatinine, Ser: 0.67 mg/dL (ref 0.44–1.00)
GFR, Estimated: >60 mL/min (ref >60)
Glucose, Bld: 199 mg/dL — ABNORMAL HIGH (ref 70–99)
Potassium: 3.8 mmol/L (ref 3.5–5.1)
Sodium: 139 mmol/L (ref 135–145)

## 2021-06-27 LAB — ECHOCARDIOGRAM COMPLETE
AR max vel: 1.63 cm2
AV Peak grad: 10.2 mmHg
Ao pk vel: 1.6 m/s
Area-P 1/2: 4.99 cm2
Height: 59 in
P 1/2 time: 592 msec
S' Lateral: 3.07 cm
Weight: 2240 oz

## 2021-06-27 LAB — CULTURE, BLOOD (ROUTINE X 2)
Culture: NO GROWTH
Culture: NO GROWTH

## 2021-06-27 LAB — GLUCOSE, CAPILLARY
Glucose-Capillary: 171 mg/dL — ABNORMAL HIGH (ref 70–99)
Glucose-Capillary: 116 mg/dL — ABNORMAL HIGH (ref 70–99)
Glucose-Capillary: 182 mg/dL — ABNORMAL HIGH (ref 70–99)
Glucose-Capillary: 179 mg/dL — ABNORMAL HIGH (ref 70–99)
Glucose-Capillary: 135 mg/dL — ABNORMAL HIGH (ref 70–99)

## 2021-06-27 LAB — MAGNESIUM: Magnesium: 1.8 mg/dL (ref 1.7–2.4)

## 2021-06-27 MED ORDER — ASPIRIN EC 81 MG PO TBEC
81.0000 mg | DELAYED_RELEASE_TABLET | Freq: Every day | ORAL | Status: DC
  Administered 2021-06-27 – 2021-06-28 (×2): 81 mg via ORAL
  Filled 2021-06-27 (×2): qty 1

## 2021-06-27 MED ORDER — METOPROLOL SUCCINATE ER 25 MG PO TB24
25.0000 mg | ORAL_TABLET | Freq: Every day | ORAL | Status: DC
  Administered 2021-06-27: 25 mg via ORAL
  Filled 2021-06-27: qty 1

## 2021-06-27 MED ORDER — INSULIN ASPART 100 UNIT/ML IJ SOLN: 0.0000 [IU] | Freq: Every day | INTRAMUSCULAR | Status: DC

## 2021-06-27 MED ORDER — INSULIN GLARGINE-YFGN 100 UNIT/ML ~~LOC~~ SOLN
8.0000 [IU] | Freq: Every day | SUBCUTANEOUS | Status: DC
  Administered 2021-06-27: 8 [IU] via SUBCUTANEOUS
  Filled 2021-06-27 (×2): qty 0.08

## 2021-06-27 MED ORDER — INSULIN ASPART 100 UNIT/ML IJ SOLN
0.0000 [IU] | Freq: Three times a day (TID) | INTRAMUSCULAR | Status: DC
  Administered 2021-06-27 (×2): 2 [IU] via SUBCUTANEOUS
  Administered 2021-06-28: 3 [IU] via SUBCUTANEOUS
  Filled 2021-06-27 (×4): qty 1

## 2021-06-27 NOTE — Discharge Instructions (Signed)
You may shower. Keep you groins clean and dry.

## 2021-06-27 NOTE — Progress Notes (Signed)
Patient ID: Autumn Arnold, female   DOB: August 24, 1951, 70 y.o.   MRN: [id number] [facility] PROGRESS NOTE  GISELA TRUSSEL [id number] DOB: 1951-10-24 DOA: 06/22/2021 PCP: [name], MD  HPI/Subjective: Patient feeling a little bit better.  States that she does have some abdominal pain when she eats.  Still with some liquid stools.  Still reddish.  No pain in her legs.  Initially admitted with limb threatening severe atherosclerosis and also found to have acute strokes on MRI  Objective: Vitals:   06/27/21 0808 06/27/21 1122  BP: 120/64 (!) 142/59  Pulse: 62 84  Resp: 18 18  Temp: 97.9 F (36.6 C) 98 F (36.7 C)  SpO2: 96% 96%    Intake/Output Summary (Last 24 hours) at 06/27/2021 1448 Last data filed at 06/27/2021 1330 Gross per 24 hour  Intake 720 ml  Output --  Net 720 ml   Filed Weights   06/22/21 1914  Weight: 63.5 kg    ROS: Review of Systems  Respiratory:  Negative for cough and shortness of breath.   Cardiovascular:  Negative for chest pain.  Gastrointestinal:  Positive for abdominal pain and diarrhea. Negative for nausea and vomiting.  Exam: Physical Exam HENT:     Head: Normocephalic.     Mouth/Throat:     Pharynx: No oropharyngeal exudate.  Eyes:     General: Lids are normal.     Conjunctiva/sclera: Conjunctivae normal.     Pupils: Pupils are equal, round, and reactive to light.  Cardiovascular:     Rate and Rhythm: Normal rate and regular rhythm.     Heart sounds: Normal heart sounds, S1 normal and S2 normal.  Pulmonary:     Breath sounds: Normal breath sounds. No decreased breath sounds, wheezing, rhonchi or rales.  Abdominal:     Palpations: Abdomen is soft.     Tenderness: There is abdominal tenderness in the right lower quadrant, suprapubic area and left lower quadrant.  Musculoskeletal:     Right lower leg: No swelling.     Left lower leg: No swelling.  Skin:    General: Skin is warm.     Findings: No rash.  Neurological:      Mental Status: She is alert and oriented to person, place, and time.      Scheduled Meds:  (feeding supplement) PROSource Plus  30 mL Oral TID BM   amitriptyline  100 mg Oral QHS   aspirin EC  81 mg Oral Daily   atorvastatin  80 mg Oral QPM   busPIRone  5 mg Oral BID   feeding supplement (GLUCERNA SHAKE)  237 mL Oral TID BM   insulin aspart  0-5 Units Subcutaneous QHS   insulin aspart  0-9 Units Subcutaneous TID WC   insulin glargine-yfgn  8 Units Subcutaneous QHS   metroNIDAZOLE  500 mg Oral Q12H   montelukast  10 mg Oral QHS   multivitamin with minerals  1 tablet Oral Daily   nicotine  21 mg Transdermal Daily   pantoprazole  40 mg Oral Daily   sertraline  100 mg Oral Daily   sodium chloride flush  3 mL Intravenous Q12H   Continuous Infusions:  sodium chloride     cefTRIAXone (ROCEPHIN)  IV Stopped (06/26/21 1830)   Brief history.  70 year old female past medical history of hypertension, hyperlipidemia, stroke, sleep apnea, anxiety, diabetes presented with syncopal episode.  He was found to have limb threatening ischemia of the bowel and lower extremities and acute strokes on  MRI of the brain.  On 06/23/2021 Dr. Delana Meyer took to the vascular lab and stented the SMA, aorta and bilateral iliacs.  On 06/24/2021 patient was diagnosed with retroperitoneal bleed and acute blood loss anemia.  Patient was transfused 1 unit of packed red blood cells on 06/25/2021.  Aspirin restarted on 06/27/2021.  Hopefully Plavix can be started tomorrow.  Assessment/Plan:  Retroperitoneal bleed with abdominal pain.  With hemoglobin now being stable at 9.3 we will start aspirin and monitor.  Tolerating advance diet but does have some pain with eating. Limb threatening severe atherosclerosis of the mesentery, aorta and lower extremities.  Vascular surgery placed stents of the superior mesenteric artery aorta and bilateral iliac vessels.  Restarting aspirin today.  Hopefully can start Plavix tomorrow. Acute stroke  seen on MRI of the brain with left frontal and left parietal area.  Lipitor prescribed.  LDL 164.  Aspirin restarted.  Hopefully can start Plavix tomorrow.  Normal sinus rhythm on telemetry monitoring.  Echocardiogram normal EF no signs of stroke.  Right carotid occlusion on CT angio.  Seen by neuro. Acute blood loss anemia.  The patient was transfused 1 unit of packed red blood cells on a hemoglobin of 7.3.  Hemoglobin 9.3 today and stable from yesterday.  Watch closely with restarting aspirin. Systemic inflammatory response syndrome.  CT scan consistent with colitis.  Likely ischemic in nature on empiric Rocephin and Flagyl to complete 7 days.  Lactic acid normal.  Rectal bleeding likely secondary to mesenteric ischemia.  Will need colonoscopy as outpatient. Tobacco abuse.  Nicotine patch.  Advised smoking cessation. Anxiety and depression on BuSpar and Zoloft. Type 2 diabetes mellitus with hyperlipidemia.  On Lipitor.  Patient had very high sugar yesterday and then hypoglycemic after that.  Likely too much short acting insulin given.  Patient on low-dose Lantus and sliding scale insulin. Weakness.  Physical therapy evaluation appreciated.  Recommending home with home health. Right carotid occlusion.  Follow-up with vascular surgery as outpatient Essential hypertension start low-dose Toprol-XL at night.  Since CT scan does show atherosclerosis of the coronary arteries also.  Currently on aspirin and Lipitor.        Code Status:     Code Status Orders  (From admission, onward)           Start     Ordered   06/26/21 1103  Do not attempt resuscitation (DNR)  Continuous       Question Answer Comment  In the event of cardiac or respiratory ARREST Do not call a "code blue"   In the event of cardiac or respiratory ARREST Do not perform Intubation, CPR, defibrillation or ACLS   In the event of cardiac or respiratory ARREST Use medication by any route, position, wound care, and other measures  to relive pain and suffering. May use oxygen, suction and manual treatment of airway obstruction as needed for comfort.   Comments nurse may pronounce      06/26/21 1103           Code Status History     Date Active Date Inactive Code Status Order ID Comments User Context   06/23/2021 0028 06/26/2021 1103 Full Code [id number]  Athena Masse, MD ED   11/07/2015 1046 11/08/2015 1716 Full Code [id number]  Loletha Grayer, MD ED      Family Communication: Spoke with daughter on the phone and sister at the bedside Disposition Plan: Status is: Inpatient  Dispo: The patient is from: Home  anticipated d/c is to: Home              Patient currently restarting blood thinners after retroperitoneal bleed.   Difficult to place patient.  No.  Consultants: Vascular surgery Neurology  Procedures: Stenting procedure to SMA, aorta and bilateral iliacs  Antibiotics: Rocephin and Flagyl  Time spent: 28 minutes  [place]

## 2021-06-27 NOTE — Progress Notes (Signed)
[facility] Daily Progress Note  06/23/21:             1.  Abdominal aortogram             2.  Introduction catheter into SMA             3.  Percutaneous transluminal angioplasty and stent placement with a 7 x 16 lifestream stent superior mesenteric artery.             4.  Percutaneous transluminal angioplasty and stent placement using a 44m x 523mlifestream stent mid infrarenal aorta             5.  Percutaneous transluminal angioplasty and stent placement right common iliac artery with 6 mm lifestream stents postdilated to 7 mm; "kissing balloon" technique             4.  Percutaneous transluminal and plasty and stent placement left common iliac arterywith 6 mm lifestream stents postdilated to 7 mm; "kissing balloon" technique             5.  Ultrasound guided access bilateral common femoral arteries             6.  StarClose closure device bilateral common femoral arteries  Subjective: Patient without complaint this AM.  Resting comfortably in bed with family at her bedside eating lunch.  No acute issues overnight.   Objective: Vitals:   06/27/21 0037 06/27/21 0453 06/27/21 0808 06/27/21 1122  BP: (!) 145/73 (!) 148/61 120/64 (!) 142/59  Pulse: 95 75 62 84  Resp: '18 18 18 18  '$ Temp: (!) 97.5 F (36.4 C) 97.9 F (36.6 C) 97.9 F (36.6 C) 98 F (36.7 C)  TempSrc:    Oral  SpO2: 91% 98% 96% 96%  Weight:      Height:        Intake/Output Summary (Last 24 hours) at 06/27/2021 1301 Last data filed at 06/27/2021 1128 Gross per 24 hour  Intake 720 ml  Output --  Net 720 ml   Physical Exam: A&Ox3, NAD CV: RRR Pulmonary: CTA Bilaterally Abdomen: Soft, Mildly tender to palpation, Mild distention Vascular:             Left Lower Extremity: Thigh soft. Calf soft. Warm distally to toes. No acute vascular compromise.              Right Lower Extremity: Thigh soft. Calf soft. Warm distally to toes. No acute vascular compromise.   Laboratory: CBC    Component  Value Date/Time   WBC 9.6 06/27/2021 0338   HGB 9.3 (L) 06/27/2021 0338   HCT 28.3 (L) 06/27/2021 0338   PLT 209 06/27/2021 0338   BMET    Component Value Date/Time   NA 139 06/27/2021 0338   K 3.8 06/27/2021 0338   CL 107 06/27/2021 0338   CO2 25 06/27/2021 0338   GLUCOSE 199 (H) 06/27/2021 0338   BUN 8 06/27/2021 0338   CREATININE 0.67 06/27/2021 0338   CALCIUM 8.2 (L) 06/27/2021 0338   GFRNONAA >60 06/27/2021 0338   GFRAA >60 11/08/2015 0419   Assessment/Planning: The patient is a [age]ear old female [facility] vascular issues status post SMA/bilateral iliac artery stenting  1) Hemoglobin is stable this AM.  Aspirin has been restarted.  Can hopefully start Plavix possibly tomorrow.  AM CBC. 2) patient CTA is completed.  Reviewed images with Dr. ScDelana Meyer Patient will  need repair of her right internal carotid artery with either stenting or open.  I had a long discussion with the patient in regard to her CTA of the neck results.  We will see the patient back in approximately 1 to 2 weeks (we have to wait 3 weeks due to her recent acute CVA) where she will meet with Dr. Delana Meyer discussed going forward.  3) patient notes that she did try to ambulate this AM and did experience some left lower extremity pain.  On exam the extremity is warm and there is no acute issue however she may need to undergo more work distally.  We will see her back in the office I will order an ABI.  We did discuss the need for possible re-angio. 5) patient has not had another bowel movement today.  Patient has been tolerating a diet.  Still no white count, fever or worsening abdominal pain.  Possible endoscopy/colonoscopy as an outpatient.  Discussed with Dr. Eber Hong Leler Brion PA-C 06/27/2021 1:01 PM

## 2021-06-27 NOTE — Progress Notes (Signed)
Mobility Specialist - Progress Note   06/27/21 1300  Mobility  Activity Ambulated to bathroom  Level of Assistance Standby assist, set-up cues, supervision of patient - no hands on  Assistive Device None  Distance Ambulated (ft) 25 ft  Mobility Out of bed for toileting  Mobility Response Tolerated well  Mobility performed by Mobility specialist  $Mobility charge 1 Mobility    Pt ambulated to bathroom with supervision, no LOB. No assist to exit bed. Independent in peri-care. Pt returned to bed with needs in reach, family at bedside.    Kathee Delton Mobility Specialist 06/27/21, 1:19 PM

## 2021-06-27 NOTE — Progress Notes (Signed)
Physical Therapy Treatment Patient Details Name: Autumn Arnold MRN: [id number] DOB: 1951-09-23 Today's Date: 06/27/2021    History of Present Illness 70 y.o. female with medical history significant for DM, nicotine dependence, anxiety, OSA, mild right lower extremity weakness from old stroke, brought in by EMS from the plasma center where she had a syncopal episode after donating plasma.  She was apparently found in the bathroom attempting to get up from the commode but was unable to do so independently. 06/23/21 MRI brain: Small foci of acute ischemia within the superior left frontal and parietal lobes.    PT Comments    Pt did well with ambulation and overall showed good effort with all aspects of PT session.  She was able to ambulate ~400 ft and did much better with stair negotiation than on the eval.  Pt still with some fatigue and L calf pain with increased distance but overall did well.    Follow Up Recommendations  Home health PT;Supervision - Intermittent     Equipment Recommendations       Recommendations for Other Services       Precautions / Restrictions Precautions Precautions: Fall Restrictions Weight Bearing Restrictions: No    Mobility  Bed Mobility Overal bed mobility: Modified Independent             General bed mobility comments: Pt able to rise to sitting EOB w/o assist, good confidence    Transfers Overall transfer level: Modified independent Equipment used: Rolling walker (2 wheeled) Transfers: Sit to/from Stand Sit to Stand: Supervision         General transfer comment: Pt able to rise to standing from multiple surface heights w/o assist, showed good sequencing and UE use  Ambulation/Gait Ambulation/Gait assistance: Min guard Gait Distance (Feet): 400 Feet Assistive device: Rolling walker (2 wheeled)       General Gait Details: Pt did well with prolonged bout of ambulation, showed good effort and tolerance.  She did start to have  increased L ankle/calf pain with increased distance, but overall did well with appropriate walker use and only minimal c/o fatigue (HR remains stable t/o the effort)   Stairs Stairs: Yes Stairs assistance: Min assist Stair Management: One rail Right Number of Stairs: 6 General stair comments: Pt did much better on steps this session than on prior attempt, still with some hestancy but not nearly the pain/weakness as on eval   Wheelchair Mobility    Modified Rankin (Stroke Patients Only)       Balance Overall balance assessment: Needs assistance Sitting-balance support: No upper extremity supported;Feet supported Sitting balance-Leahy Scale: Good     Standing balance support: No upper extremity supported Standing balance-Leahy Scale: Fair Standing balance comment: again able to ambulate some w/o AD, but fatigue and pain caused minimal instability in standing                            Cognition Arousal/Alertness: Awake/alert Behavior During Therapy: WFL for tasks assessed/performed Overall Cognitive Status: Within Functional Limits for tasks assessed                                        Exercises      General Comments General comments (skin integrity, edema, etc.): Pt was eager to work with PT, overall did well with prolonged bout of ambulation.  L calf pain with  increased activity, (-) Homan's      Pertinent Vitals/Pain Pain Assessment: 0-10 Pain Score: 4  Pain Location: L ankle/calf    Home Living                      Prior Function            PT Goals (current goals can now be found in the care plan section) Progress towards PT goals: Progressing toward goals    Frequency    Min 2X/week      PT Plan Frequency needs to be updated    Co-evaluation              AM-PAC PT "6 Clicks" Mobility   Outcome Measure  Help needed turning from your back to your side while in a flat bed without using bedrails?:  None Help needed moving from lying on your back to sitting on the side of a flat bed without using bedrails?: None Help needed moving to and from a bed to a chair (including a wheelchair)?: None Help needed standing up from a chair using your arms (e.g., wheelchair or bedside chair)?: None Help needed to walk in hospital room?: A Little Help needed climbing 3-5 steps with a railing? : A Lot 6 Click Score: 21    End of Session Equipment Utilized During Treatment: Gait belt Activity Tolerance: Patient limited by pain;Patient limited by fatigue Patient left: in chair;with chair alarm set;with family/visitor present;with call bell/phone within reach Nurse Communication: Mobility status PT Visit Diagnosis: Muscle weakness (generalized) (M62.81);Difficulty in walking, not elsewhere classified (R26.2);Pain;Other symptoms and signs involving the nervous system (R29.898) Pain - Right/Left: Left Pain - part of body: Hip     Time: [id number] PT Time Calculation (min) (ACUTE ONLY): 17 min  Charges:  $Gait Training: 8-22 mins                     Kreg Shropshire, DPT 06/27/2021, 1:28 PM

## 2021-06-28 DIAGNOSIS — I998 Other disorder of circulatory system: Secondary | ICD-10-CM | POA: Diagnosis not present

## 2021-06-28 DIAGNOSIS — I639 Cerebral infarction, unspecified: Secondary | ICD-10-CM | POA: Diagnosis not present

## 2021-06-28 DIAGNOSIS — R6510 Systemic inflammatory response syndrome (SIRS) of non-infectious origin without acute organ dysfunction: Secondary | ICD-10-CM | POA: Diagnosis not present

## 2021-06-28 DIAGNOSIS — D62 Acute posthemorrhagic anemia: Secondary | ICD-10-CM | POA: Diagnosis not present

## 2021-06-28 LAB — BASIC METABOLIC PANEL
Anion gap: 8 (ref 5–15)
BUN: 8 mg/dL (ref 8–23)
CO2: 25 mmol/L (ref 22–32)
Calcium: 8.4 mg/dL — ABNORMAL LOW (ref 8.9–10.3)
Chloride: 105 mmol/L (ref 98–111)
Creatinine, Ser: 0.69 mg/dL (ref 0.44–1.00)
GFR, Estimated: >60 mL/min (ref >60)
Glucose, Bld: 120 mg/dL — ABNORMAL HIGH (ref 70–99)
Potassium: 3.6 mmol/L (ref 3.5–5.1)
Sodium: 138 mmol/L (ref 135–145)

## 2021-06-28 LAB — CBC
HCT: 28 % — ABNORMAL LOW (ref 36.0–46.0)
Hemoglobin: 9.5 g/dL — ABNORMAL LOW (ref 12.0–15.0)
MCH: 30.2 pg (ref 26.0–34.0)
MCHC: 33.9 g/dL (ref 30.0–36.0)
MCV: 88.9 fL (ref 80.0–100.0)
Platelets: 229 10*3/uL (ref 150–400)
RBC: 3.15 MIL/uL — ABNORMAL LOW (ref 3.87–5.11)
RDW: 15.7 % — ABNORMAL HIGH (ref 11.5–15.5)
WBC: 7.7 10*3/uL (ref 4.0–10.5)
nRBC: 0 % (ref 0.0–0.2)

## 2021-06-28 LAB — GLUCOSE, CAPILLARY
Glucose-Capillary: 111 mg/dL — ABNORMAL HIGH (ref 70–99)
Glucose-Capillary: 231 mg/dL — ABNORMAL HIGH (ref 70–99)

## 2021-06-28 MED ORDER — CLOPIDOGREL BISULFATE 75 MG PO TABS: 75.0000 mg | ORAL_TABLET | Freq: Every day | ORAL | 2 refills | Status: DC

## 2021-06-28 MED ORDER — AMOXICILLIN-POT CLAVULANATE 875-125 MG PO TABS
1.0000 | ORAL_TABLET | Freq: Two times a day (BID) | ORAL | Status: DC
  Administered 2021-06-28: 1 via ORAL
  Filled 2021-06-28: qty 1

## 2021-06-28 MED ORDER — CLOPIDOGREL BISULFATE 75 MG PO TABS
75.0000 mg | ORAL_TABLET | Freq: Every day | ORAL | Status: DC
  Administered 2021-06-28: 75 mg via ORAL
  Filled 2021-06-28: qty 1

## 2021-06-28 MED ORDER — HYDROCODONE-ACETAMINOPHEN 5-325 MG PO TABS: 1.0000 | ORAL_TABLET | Freq: Three times a day (TID) | ORAL | 0 refills | Status: DC | PRN

## 2021-06-28 MED ORDER — SERTRALINE HCL 50 MG PO TABS: 150.0000 mg | ORAL_TABLET | Freq: Every day | ORAL | 0 refills | Status: DC

## 2021-06-28 MED ORDER — AMOXICILLIN-POT CLAVULANATE 875-125 MG PO TABS: 1.0000 | ORAL_TABLET | Freq: Two times a day (BID) | ORAL | 0 refills | Status: AC

## 2021-06-28 MED ORDER — ADULT MULTIVITAMIN W/MINERALS CH: 1.0000 | ORAL_TABLET | Freq: Every day | ORAL | 0 refills | Status: DC

## 2021-06-28 MED ORDER — NICOTINE 21 MG/24HR TD PT24: 21.0000 mg | MEDICATED_PATCH | Freq: Every day | TRANSDERMAL | 0 refills | Status: DC

## 2021-06-28 NOTE — Plan of Care (Signed)
Signed out by my colleague to follow echo as a part of the stroke w/u.  Reviewed - report below. No abnormalities.  2D Echo IMPRESSIONS   1. Left ventricular ejection fraction, by estimation, is 60 to 65%. The  left ventricle has normal function. The left ventricle has no regional  wall motion abnormalities. There is mild concentric left ventricular  hypertrophy. Left ventricular diastolic  parameters were normal.   2. Right ventricular systolic function is normal. The right ventricular  size is normal.   3. The mitral valve is normal in structure. Trivial mitral valve  regurgitation. No evidence of mitral stenosis.   4. The aortic valve is normal in structure. Aortic valve regurgitation is  not visualized. No aortic stenosis is present.   5. The inferior vena cava is normal in size with greater than 50%  respiratory variability, suggesting right atrial pressure of 3 mmHg.   Recs: As in progress note from Dr. Quinn Axe (06/24/2021). Please call with questions.  -- Amie Portland, MD Neurologist [facility] Pager: [phone]

## 2021-06-28 NOTE — Discharge Summary (Signed)
[facility] NAME: Autumn Arnold    MR#:  [id number]  DATE OF BIRTH:  [birth year]  DATE OF ADMISSION:  06/22/2021 ADMITTING PHYSICIAN: Athena Masse, MD  DATE OF DISCHARGE: 06/28/2021  PRIMARY CARE PHYSICIAN: [name], MD    ADMISSION DIAGNOSIS:  Syncope and collapse [R55] Syncope [R55] Lower limb ischemia [I99.8] Critical lower limb ischemia (HCC) [I70.229] Sepsis without acute organ dysfunction, due to unspecified organism ([place]) [A41.9]  DISCHARGE DIAGNOSIS:  Acute blood loss anemia secondary to retroperitoneal bleed with abdominal pain now stable Limb threatening severe atherosclerosis of mesenteric, aortic and lower extremity status post angiogram with stent placement in superior mesenteric artery, aorta and bilateral iliac vessels Acute stroke-- left frontal and left parietal area SIRS secondary to colitis Right carotid stenosis follow-up vascular as outpatient  SECONDARY DIAGNOSIS:   Past Medical History:  Diagnosis Date  . Anxiety   . Arthritis   . Asthma   . Bilateral lower extremity edema   . COPD (chronic obstructive pulmonary disease) ([place])   . Cough    CHRONIC  . Diabetes mellitus 2008  . Edema    LEGS/FEET  . GERD (gastroesophageal reflux disease)   . Hyperlipidemia   . Hypertension   . Shortness of breath dyspnea    DOE  . Sleep apnea    CPAP at [facility], Dr. Humphrey Rolls  . Stroke [facility]) 02/2010   headache, left arm numbness  . Wheezing     HOSPITAL COURSE:   70 year old female past medical history of hypertension, hyperlipidemia, stroke, sleep apnea, anxiety, diabetes presented with syncopal episode.  He was found to have limb threatening ischemia of the bowel and lower extremities and acute strokes on MRI of the brain.  On 06/23/2021 Dr. Delana Meyer took to the vascular lab and stented the SMA, aorta and bilateral iliacs.  On 06/24/2021 patient was diagnosed with retroperitoneal bleed and acute blood loss  anemia.  Patient was transfused 1 unit of packed red blood cells on 06/25/2021.  Aspirin restarted on 06/27/2021.  Retroperitoneal bleed with abdominal pain acute blood loss anemia status post blood transfusion --  With hemoglobin now being stable at 9.3 we will start aspirin and monitor.  Tolerating advance diet but does have some pain with eating. -- Plavix. Hemoglobin 9.5 today  Limb threatening severe atherosclerosis of the mesentery, aorta and lower extremities.  -- Vascular surgery Dr. Delana Meyer placed stents of the superior mesenteric artery aorta and bilateral iliac vessels. -- now on aspirin and Plavix  -- continue statins  Acute stroke seen on MRI of the brain with left frontal and left parietal area.  -- Lipitor prescribed.  LDL 164.   -- aspirin Plavix as above -- Normal sinus rhythm on telemetry monitoring.  -- Echocardiogram normal EF no signs of stroke.   --Right carotid occlusion on CT angio.  Seen by neuro.  Systemic inflammatory response syndrome.  -- CT scan consistent with colitis.  Likely ischemic in nature on empiric Rocephin and Flagyl-- PO Augmentin to complete 7 days.  Lactic acid normal.  Rectal bleeding likely secondary to mesenteric ischemia.  Will need colonoscopy as outpatient-- defer to PCP  Tobacco abuse.  Nicotine patch.  Advised smoking cessation.  Anxiety and depression  --on BuSpar and Zoloft.  Type 2 diabetes mellitus with hyperlipidemia.  On Lipitor.   -- I have resumed her metformin. -- glipizide can be resumed by PCP depending on sugars at home.  Weakness.  Physical therapy evaluation appreciated--  patient declines home PT   Right carotid occlusion.  Follow-up with vascular surgery as outpatient s Essential hypertension cont bb   Since CT scan does show atherosclerosis of the coronary arteries also.  Currently on aspirin and Lipitor.   Overall patient improving. Hemodynamically stable Cust with vascular okay to discharge home with outpatient  follow-up  CONSULTS OBTAINED:  Treatment Team:  Katha Cabal, MD  DRUG ALLERGIES:   Allergies  Allergen Reactions  . Lisinopril Other (See Comments)    Tongue burning sensation    DISCHARGE MEDICATIONS:   Allergies as of 06/28/2021       Reactions   Lisinopril Other (See Comments)   Tongue burning sensation        Medication List     STOP taking these medications    aspirin-acetaminophen-caffeine 250-250-65 MG tablet Commonly known as: EXCEDRIN MIGRAINE   glipiZIDE 10 MG tablet Commonly known as: GLUCOTROL       TAKE these medications    albuterol 108 (90 Base) MCG/ACT inhaler Commonly known as: VENTOLIN HFA Inhale 2 puffs into the lungs as needed.   alendronate 70 MG tablet Commonly known as: FOSAMAX Take 70 mg by mouth once a week. Take with a full glass of water on an empty stomach.   amitriptyline 150 MG tablet Commonly known as: ELAVIL Take by mouth daily. Reported on 12/20/2015   amoxicillin-clavulanate 875-125 MG tablet Commonly known as: AUGMENTIN Take 1 tablet by mouth every 12 (twelve) hours for 2 days.   aspirin 81 MG EC tablet Take 1 tablet (81 mg total) by mouth daily.   atorvastatin 80 MG tablet Commonly known as: LIPITOR Take 80 mg by mouth daily.   busPIRone 5 MG tablet Commonly known as: BUSPAR Take 5 mg by mouth 2 (two) times daily.   Calcium Carb-Cholecalciferol 600-400 MG-UNIT Tabs Take 1 tablet by mouth 2 (two) times daily.   clopidogrel 75 MG tablet Commonly known as: PLAVIX Take 1 tablet (75 mg total) by mouth daily.   gabapentin 600 MG tablet Commonly known as: NEURONTIN Take 1,200 mg by mouth 2 (two) times daily.   HYDROcodone-acetaminophen 5-325 MG tablet Commonly known as: NORCO/VICODIN Take 1-2 tablets by mouth every 8 (eight) hours as needed for moderate pain or severe pain.   metFORMIN 1000 MG tablet Commonly known as: GLUCOPHAGE Take 1,000 mg by mouth 2 (two) times daily.   metoprolol tartrate 50  MG tablet Commonly known as: LOPRESSOR Take 50 mg by mouth 2 (two) times daily.   montelukast 10 MG tablet Commonly known as: SINGULAIR Take 10 mg by mouth daily.   multivitamin with minerals Tabs tablet Take 1 tablet by mouth daily. Start taking on: June 29, 2021   nicotine 21 mg/24hr patch Commonly known as: NICODERM CQ - dosed in mg/24 hours Place 1 patch (21 mg total) onto the skin daily. Start taking on: June 29, 2021   pantoprazole 40 MG tablet Commonly known as: PROTONIX Take 40 mg by mouth daily.   sertraline 50 MG tablet Commonly known as: Zoloft Take 3 tablets (150 mg total) by mouth daily. What changed:  medication strength how much to take   tiZANidine 4 MG tablet Commonly known as: ZANAFLEX Take 4 mg by mouth 2 (two) times daily.   vitamin B-12 1000 MCG tablet Commonly known as: CYANOCOBALAMIN Take 1,000 mcg by mouth daily. Reported on 12/20/2015        If you experience worsening of your admission symptoms, develop shortness of breath, life threatening  emergency, suicidal or homicidal thoughts you must seek medical attention immediately by calling 911 or calling your MD immediately  if symptoms less severe.  You Must read complete instructions/literature along with all the possible adverse reactions/side effects for all the Medicines you take and that have been prescribed to you. Take any new Medicines after you have completely understood and accept all the possible adverse reactions/side effects.   Please note  You were cared for by a hospitalist during your hospital stay. If you have any questions about your discharge medications or the care you received while you were in the hospital after you are discharged, you can call the unit and asked to speak with the hospitalist on call if the hospitalist that took care of you is not available. Once you are discharged, your primary care physician will handle any further medical issues. Please note that NO  REFILLS for any discharge medications will be authorized once you are discharged, as it is imperative that you return to your primary care physician (or establish a relationship with a primary care physician if you do not have one) for your aftercare needs so that they can reassess your need for medications and monitor your lab values. Today   SUBJECTIVE   sitting out in the chair. Sister at bedside. Denies any complaints. Ambulating to the bathroom and in the room by herself  VITAL SIGNS:  Blood pressure (!) 145/69, pulse 77, temperature (!) 97.5 F (36.4 C), resp. rate 18, height '4\' 11"'$  (1.499 m), weight 63.5 kg, SpO2 97 %.  I/O:   Intake/Output Summary (Last 24 hours) at 06/28/2021 1210 Last data filed at 06/28/2021 1017 Gross per 24 hour  Intake 960 ml  Output 1200 ml  Net -240 ml    PHYSICAL EXAMINATION:  GENERAL:  70 y.o.-year-old patient lying in the bed with no acute distress.  LUNGS: Normal breath sounds bilaterally, no wheezing, rales,rhonchi or crepitation. No use of accessory muscles of respiration.  CARDIOVASCULAR: S1, S2 normal. No murmurs, rubs, or gallops.  ABDOMEN: Soft, non-tender, mild distended. Bowel sounds present. No organomegaly or mass.  EXTREMITIES: No pedal edema, cyanosis, or clubbing.  NEUROLOGIC: Cranial nerves II through XII are intact. Muscle strength 5/5 in all extremities. Sensation intact. Gait not checked.  PSYCHIATRIC: The patient is alert and oriented x 3.  SKIN: No obvious rash, lesion, or ulcer.   DATA REVIEW:   CBC  Recent Labs  Lab 06/28/21 0545  WBC 7.7  HGB 9.5*  HCT 28.0*  PLT 229    Chemistries  Recent Labs  Lab 06/27/21 0338 06/28/21 0545  NA 139 138  K 3.8 3.6  CL 107 105  CO2 25 25  GLUCOSE 199* 120*  BUN 8 8  CREATININE 0.67 0.69  CALCIUM 8.2* 8.4*  MG 1.8  --     Microbiology Results   Recent Results (from the past 240 hour(s))  Resp Panel by RT-PCR (Flu A&B, Covid) Nasopharyngeal Swab     Status: None    Collection Time: 06/22/21  8:51 PM   Specimen: Nasopharyngeal Swab; Nasopharyngeal(NP) swabs in vial transport medium  Result Value Ref Range Status   SARS Coronavirus 2 by RT PCR NEGATIVE NEGATIVE Final    Comment: (NOTE) SARS-CoV-2 target nucleic acids are NOT DETECTED.  The SARS-CoV-2 RNA is generally detectable in upper respiratory specimens during the acute phase of infection. The lowest concentration of SARS-CoV-2 viral copies this assay can detect is 138 copies/mL. A negative result does not preclude SARS-Cov-2  infection and should not be used as the sole basis for treatment or other patient management decisions. A negative result may occur with  improper specimen collection/handling, submission of specimen other than nasopharyngeal swab, presence of viral mutation(s) within the areas targeted by this assay, and inadequate number of viral copies(<138 copies/mL). A negative result must be combined with clinical observations, patient history, and epidemiological information. The expected result is Negative.  Fact Sheet for Patients:  EntrepreneurPulse.com.au  Fact Sheet for Healthcare Providers:  IncredibleEmployment.be  This test is no t yet approved or cleared by the [place] FDA and  has been authorized for detection and/or diagnosis of SARS-CoV-2 by FDA under an Emergency Use Authorization (EUA). This EUA will remain  in effect (meaning this test can be used) for the duration of the COVID-19 declaration under Section 564(b)(1) of the Act, 21 U.S.C.section 360bbb-3(b)(1), unless the authorization is terminated  or revoked sooner.       Influenza A by PCR NEGATIVE NEGATIVE Final   Influenza B by PCR NEGATIVE NEGATIVE Final    Comment: (NOTE) The Xpert Xpress SARS-CoV-2/FLU/RSV plus assay is intended as an aid in the diagnosis of influenza from Nasopharyngeal swab specimens and should not be used as a sole basis for treatment.  Nasal washings and aspirates are unacceptable for Xpert Xpress SARS-CoV-2/FLU/RSV testing.  Fact Sheet for Patients: EntrepreneurPulse.com.au  Fact Sheet for Healthcare Providers: IncredibleEmployment.be  This test is not yet approved or cleared by the [place] FDA and has been authorized for detection and/or diagnosis of SARS-CoV-2 by FDA under an Emergency Use Authorization (EUA). This EUA will remain in effect (meaning this test can be used) for the duration of the COVID-19 declaration under Section 564(b)(1) of the Act, 21 U.S.C. section 360bbb-3(b)(1), unless the authorization is terminated or revoked.  Performed at [facility], [address] [place], [place] [address]   Blood culture (routine x 2)     Status: None   Collection Time: 06/22/21  8:51 PM   Specimen: BLOOD  Result Value Ref Range Status   Specimen Description BLOOD LEFT ASSIST CONTROL  Final   Special Requests   Final    BOTTLES DRAWN AEROBIC AND ANAEROBIC Blood Culture results may not be optimal due to an inadequate volume of blood received in culture bottles   Culture   Final    NO GROWTH 5 DAYS Performed at [facility], [address] [place], [place] [address]    Report Status 06/27/2021 FINAL  Final  Blood culture (routine x 2)     Status: None   Collection Time: 06/22/21  8:51 PM   Specimen: BLOOD  Result Value Ref Range Status   Specimen Description BLOOD LEFT HAND  Final   Special Requests   Final    BOTTLES DRAWN AEROBIC AND ANAEROBIC Blood Culture results may not be optimal due to an inadequate volume of blood received in culture bottles   Culture   Final    NO GROWTH 5 DAYS Performed at [facility], [address], [place], [place] [address]    Report Status 06/27/2021 FINAL  Final  Urine Culture     Status: None   Collection Time: 06/22/21  8:52 PM   Specimen: Urine, Clean Catch  Result Value Ref Range Status    Specimen Description   Final    URINE, CLEAN CATCH Performed at [facility], [address], [place], [place] [address]    Special Requests   Final  NONE Performed at [facility] [address] [place], [place] [address]    Culture   Final    NO GROWTH Performed at [facility], [place] [address] [place], [place] [address]    Report Status 06/24/2021 FINAL  Final    RADIOLOGY:  CT ANGIO NECK W OR WO CONTRAST  Result Date: 06/26/2021 CLINICAL DATA:  Stroke/TIA. Determine embolic source. Carotid stenosis on ultrasound. EXAM: CT ANGIOGRAPHY NECK TECHNIQUE: Multidetector CT imaging of the neck was performed using the standard protocol during bolus administration of intravenous contrast. Multiplanar CT image reconstructions and MIPs were obtained to evaluate the vascular anatomy. Carotid stenosis measurements (when applicable) are obtained utilizing NASCET criteria, using the distal internal carotid diameter as the denominator. CONTRAST:  81m OMNIPAQUE IOHEXOL 350 MG/ML SOLN COMPARISON:  MRI studies done August 19 than August 20th. FINDINGS: Aortic arch: Aortic atherosclerosis. Branching pattern is normal. Origin detail is limited because of motion and artifact from dense adjacent venous contrast. Right carotid system: Common carotid artery is occluded just beyond its origin. Reconstituted flow is seen in the distal common carotid artery, presumably from external to internal collaterals. At the carotid bifurcation and ICA bulb, minimal diameter is 2.8 mm. Compared to a more distal cervical ICA diameter of the same, there is no additional stenosis at this level. Flow is present through the skull base and carotid siphon region. There is extensive siphon atherosclerotic calcification with stenosis estimated at 30-50% in that region. Left carotid system: Common carotid artery shows scattered plaque but is sufficiently patent to the bifurcation region. Soft and calcified plaque  is present at the carotid bifurcation and ICA bulb. Minimal diameter is 2.3 mm. Compared to a more distal cervical ICA diameter of 4.7 mm, this represents a 50% stenosis. Beyond that, the vessel is patent through the skull base and siphon regions. There is severe stenosis of the external carotid artery origin. Atherosclerotic calcification present in the carotid siphon region but without stenosis greater than 50% suspected. I do not identify a cavernous ICA aneurysm. Vertebral arteries: Vertebral artery origin detail is limited because of motion and dense venous contrast. There is calcified plaque at both vertebral artery origins. There is estimated 50% stenosis at each vertebral artery origin. Beyond that, the vessel shows scattered plaque but are sufficiently patent through the cervical region, through the foramen magnum to the basilar. There is atherosclerotic plaque in both vertebral artery V4 segments with stenosis estimated at 30 % on both sides. No basilar stenosis is seen. Skeleton: Ordinary cervical spondylosis. Previous fusion procedure at C5-6, with probable solid union. Other neck: No mass or lymphadenopathy. Upper chest: Mild scarring at the lung apices. IMPRESSION: In this patient with acute embolic strokes in the left hemisphere, there is considerable atherosclerotic disease at the carotid bifurcation and ICA bulb. There is 50% stenosis of the ICA in the bulb. Severe stenosis of the ECA at its origin. Occlusion of the right common carotid artery proximally. Reconstitution in the bifurcation region presumably from external to internal collaterals. Atherosclerotic disease at the bifurcation and ICA bulb but the vessel diameter in the bulb is the same as the more distal cervical ICA, therefore there is no actual stenosis by NASCET criteria. Atherosclerotic disease in both carotid siphon regions with stenosis estimated at 30-50% on both sides. Atherosclerotic disease at both vertebral artery origins with  stenosis estimated at 50% on both sides. Electronically Signed   By: MNelson ChimesM.D.   On: 06/26/2021 15:26  CODE STATUS:     Code Status Orders  (From admission, onward)           Start     Ordered   06/26/21 1103  Do not attempt resuscitation (DNR)  Continuous       Question Answer Comment  In the event of cardiac or respiratory ARREST Do not call a "code blue"   In the event of cardiac or respiratory ARREST Do not perform Intubation, CPR, defibrillation or ACLS   In the event of cardiac or respiratory ARREST Use medication by any route, position, wound care, and other measures to relive pain and suffering. May use oxygen, suction and manual treatment of airway obstruction as needed for comfort.   Comments nurse may pronounce      06/26/21 1103           Code Status History     Date Active Date Inactive Code Status Order ID Comments User Context   06/23/2021 0028 06/26/2021 1103 Full Code [id number]  Athena Masse, MD ED   11/07/2015 1046 11/08/2015 1716 Full Code [id number]  Loletha Grayer, MD ED        TOTAL TIME TAKING CARE OF THIS PATIENT: 35 minutes.    Fritzi Mandes M.D  [facility]    CC: Primary care physician; [name], MD

## 2021-07-07 DIAGNOSIS — D62 Acute posthemorrhagic anemia: Secondary | ICD-10-CM | POA: Diagnosis not present

## 2021-07-07 DIAGNOSIS — I7090 Unspecified atherosclerosis: Secondary | ICD-10-CM | POA: Diagnosis not present

## 2021-07-07 DIAGNOSIS — E119 Type 2 diabetes mellitus without complications: Secondary | ICD-10-CM | POA: Diagnosis not present

## 2021-07-07 DIAGNOSIS — Z8673 Personal history of transient ischemic attack (TIA), and cerebral infarction without residual deficits: Secondary | ICD-10-CM | POA: Diagnosis not present

## 2021-07-07 DIAGNOSIS — F17210 Nicotine dependence, cigarettes, uncomplicated: Secondary | ICD-10-CM | POA: Diagnosis not present

## 2021-07-13 ENCOUNTER — Other Ambulatory Visit (INDEPENDENT_AMBULATORY_CARE_PROVIDER_SITE_OTHER): Payer: Self-pay | Admitting: Vascular Surgery

## 2021-07-13 DIAGNOSIS — I700 Atherosclerosis of aorta: Secondary | ICD-10-CM

## 2021-07-13 DIAGNOSIS — Z95820 Peripheral vascular angioplasty status with implants and grafts: Secondary | ICD-10-CM

## 2021-07-17 ENCOUNTER — Other Ambulatory Visit: Payer: Self-pay

## 2021-07-17 ENCOUNTER — Ambulatory Visit (INDEPENDENT_AMBULATORY_CARE_PROVIDER_SITE_OTHER): Payer: Medicare HMO

## 2021-07-17 ENCOUNTER — Ambulatory Visit (INDEPENDENT_AMBULATORY_CARE_PROVIDER_SITE_OTHER): Payer: Medicare HMO | Admitting: Vascular Surgery

## 2021-07-17 DIAGNOSIS — E785 Hyperlipidemia, unspecified: Secondary | ICD-10-CM | POA: Insufficient documentation

## 2021-07-17 DIAGNOSIS — I700 Atherosclerosis of aorta: Secondary | ICD-10-CM | POA: Diagnosis not present

## 2021-07-17 DIAGNOSIS — K551 Chronic vascular disorders of intestine: Secondary | ICD-10-CM | POA: Insufficient documentation

## 2021-07-17 DIAGNOSIS — I70219 Atherosclerosis of native arteries of extremities with intermittent claudication, unspecified extremity: Secondary | ICD-10-CM | POA: Insufficient documentation

## 2021-07-17 DIAGNOSIS — Z95820 Peripheral vascular angioplasty status with implants and grafts: Secondary | ICD-10-CM

## 2021-07-17 NOTE — Progress Notes (Deleted)
MRN : [id number]  Autumn Arnold is a 70 y.o. (April 29, 1951) female who presents with chief complaint of check legs.  History of Present Illness:   The patient returns to the office for followup and review status post angiogram with intervention.   Procedure:  1.  Percutaneous transluminal angioplasty and stent placement with a 7 x 16 lifestream stent superior mesenteric artery.  2.  Percutaneous transluminal angioplasty and stent placement using a 9 mm x 58 mm lifestream stent mid infrarenal aorta  3.  Percutaneous transluminal angioplasty and stent placement right common iliac artery with 6 mm lifestream stents postdilated to 7 mm; "kissing balloon" technique  4.  Percutaneous transluminal and plasty and stent placement left common iliac arterywith 6 mm lifestream stents postdilated to 7 mm; "kissing balloon" technique  The patient notes improvement in the lower extremity symptoms. No interval shortening of the patient's claudication distance or rest pain symptoms. Previous wounds have now healed.  No new ulcers or wounds have occurred since the last visit.  There have been no significant changes to the patient's overall health care.  The patient denies amaurosis fugax or recent TIA symptoms. There are no recent neurological changes noted. The patient denies history of DVT, PE or superficial thrombophlebitis. The patient denies recent episodes of angina or shortness of breath.   ABI's Rt=*** and Lt=***  (previous ABI's Rt=*** and Lt=***) Duplex US of the *** lower extremity arterial system shows ***   No outpatient medications have been marked as taking for the 07/17/21 encounter (Appointment) with Delana Meyer, Dolores Lory, MD.    Past Medical History:  Diagnosis Date   Anxiety    Arthritis    Asthma    Bilateral lower extremity edema    COPD (chronic obstructive pulmonary disease) ([place])    Cough    CHRONIC   Diabetes mellitus 2008   Edema    LEGS/FEET   GERD (gastroesophageal  reflux disease)    Hyperlipidemia    Hypertension    Shortness of breath dyspnea    DOE   Sleep apnea    CPAP at [facility], Dr. Humphrey Rolls   Stroke [facility]) 02/2010   headache, left arm numbness   Wheezing     Past Surgical History:  Procedure Laterality Date   ABDOMINAL HYSTERECTOMY     ANTERIOR FUSION CERVICAL SPINE     CARPAL TUNNEL RELEASE     Bilateral   CATARACT EXTRACTION W/PHACO Left 03/12/2016   Procedure: CATARACT EXTRACTION PHACO AND INTRAOCULAR LENS PLACEMENT ([place]);  Surgeon: Estill Cotta, MD;  Location: [facility] ORS;  Service: Ophthalmology;  Laterality: Left;  [place] 01:12 AP% 23.4 CDE 29.83 fluid pack lot # [id number]   CATARACT EXTRACTION W/PHACO Right 04/16/2016   Procedure: CATARACT EXTRACTION PHACO AND INTRAOCULAR LENS PLACEMENT ([facility]);  Surgeon: Estill Cotta, MD;  Location: [facility] ORS;  Service: Ophthalmology;  Laterality: Right;  [place] 01:17 AP% 22.5 CDE 35.08 Fluid pack lot # [id number]   CESAREAN SECTION     COLONOSCOPY WITH PROPOFOL N/A 08/12/2018   Procedure: COLONOSCOPY WITH PROPOFOL;  Surgeon: Toledo, Benay Pike, MD;  Location: [facility] ENDOSCOPY;  Service: Gastroenterology;  Laterality: N/A;   ELBOW SURGERY     Tendonitis   ESOPHAGOGASTRODUODENOSCOPY (EGD) WITH PROPOFOL N/A 08/12/2018   Procedure: ESOPHAGOGASTRODUODENOSCOPY (EGD) WITH PROPOFOL;  Surgeon: Toledo, Benay Pike, MD;  Location: [facility] ENDOSCOPY;  Service: Gastroenterology;  Laterality: N/A;   INCONTINENCE SURGERY     LOWER EXTREMITY ANGIOGRAPHY Left 06/23/2021   Procedure: Lower Extremity Angiography;  Surgeon: Katha Cabal, MD;  Location: [place] [facility];  Service: Cardiovascular;  Laterality: Left;   TONSILLECTOMY      Social History Social History   Tobacco Use   Smoking status: Every Day    Packs/day: 1.00    Types: Cigarettes   Smokeless tobacco: Never  Substance Use Topics   Alcohol use: Yes    Alcohol/week: 7.0 standard drinks    Types: 7 Cans of beer per week   Drug use: No    Family  History Family History  Problem Relation Age of Onset   CAD Mother    Hypertension Mother    Arthritis Mother    Diabetes Father    CAD Father     Allergies  Allergen Reactions   Lisinopril Other (See Comments)    Tongue burning sensation     REVIEW OF SYSTEMS (Negative unless checked)  Constitutional: '[]'$ Weight loss  '[]'$ Fever  '[]'$ Chills Cardiac: '[]'$ Chest pain   '[]'$ Chest pressure   '[]'$ Palpitations   '[]'$ Shortness of breath when laying flat   '[]'$ Shortness of breath with exertion. Vascular:  '[]'$ Pain in legs with walking   '[]'$ Pain in legs at rest  '[]'$ History of DVT   '[]'$ Phlebitis   '[]'$ Swelling in legs   '[]'$ Varicose veins   '[]'$ Non-healing ulcers Pulmonary:   '[]'$ Uses home oxygen   '[]'$ Productive cough   '[]'$ Hemoptysis   '[]'$ Wheeze  '[]'$ COPD   '[]'$ Asthma Neurologic:  '[]'$ Dizziness   '[]'$ Seizures   '[]'$ History of stroke   '[]'$ History of TIA  '[]'$ Aphasia   '[]'$ Vissual changes   '[]'$ Weakness or numbness in arm   '[]'$ Weakness or numbness in leg Musculoskeletal:   '[]'$ Joint swelling   '[]'$ Joint pain   '[]'$ Low back pain Hematologic:  '[]'$ Easy bruising  '[]'$ Easy bleeding   '[]'$ Hypercoagulable state   '[]'$ Anemic Gastrointestinal:  '[]'$ Diarrhea   '[]'$ Vomiting  '[]'$ Gastroesophageal reflux/heartburn   '[]'$ Difficulty swallowing. Genitourinary:  '[]'$ Chronic kidney disease   '[]'$ Difficult urination  '[]'$ Frequent urination   '[]'$ Blood in urine Skin:  '[]'$ Rashes   '[]'$ Ulcers  Psychological:  '[]'$ History of anxiety   '[]'$  History of major depression.  Physical Examination  There were no vitals filed for this visit. There is no height or weight on file to calculate BMI. Gen: WD/WN, NAD Head: [place]/AT, No temporalis wasting.  Ear/Nose/Throat: Hearing grossly intact, nares w/o erythema or drainage Eyes: PER, EOMI, sclera nonicteric.  Neck: Supple, no masses.  No bruit or JVD.  Pulmonary:  Good air movement, no audible wheezing, no use of accessory muscles.  Cardiac: RRR, normal S1, S2, no Murmurs. Vascular:  *** Vessel Right Left  Radial Palpable Palpable  Carotid Palpable  Palpable  PT Palpable Palpable  DP Palpable Palpable  Gastrointestinal: soft, non-distended. No guarding/no peritoneal signs.  Musculoskeletal: M/S 5/5 throughout.  No visible deformity.  Neurologic: CN 2-12 intact. Pain and light touch intact in extremities.  Symmetrical.  Speech is fluent. Motor exam as listed above. Psychiatric: Judgment intact, Mood & affect appropriate for pt's clinical situation. Dermatologic: No rashes or ulcers noted.  No changes consistent with cellulitis.   CBC Lab Results  Component Value Date   WBC 7.7 06/28/2021   HGB 9.5 (L) 06/28/2021   HCT 28.0 (L) 06/28/2021   MCV 88.9 06/28/2021   PLT 229 06/28/2021    BMET    Component Value Date/Time   NA 138 06/28/2021 0545   K 3.6 06/28/2021 0545   CL 105 06/28/2021 0545   CO2 25 06/28/2021 0545   GLUCOSE 120 (H) 06/28/2021 0545   BUN 8 06/28/2021  0545   CREATININE 0.69 06/28/2021 0545   CALCIUM 8.4 (L) 06/28/2021 0545   GFRNONAA >60 06/28/2021 0545   GFRAA >60 11/08/2015 0419   CrCl cannot be calculated (Unknown ideal weight.).  COAG Lab Results  Component Value Date   INR 1.0 06/22/2021   INR 0.89 11/07/2015    Radiology CT HEAD WO CONTRAST (5MM)  Result Date: 06/24/2021 CLINICAL DATA:  Neuro deficit, acute, stroke suspected; dizziness in patient with recent acute stroke heparin drip EXAM: CT HEAD WITHOUT CONTRAST TECHNIQUE: Contiguous axial images were obtained from the base of the skull through the vertex without intravenous contrast. COMPARISON:  06/22/2021, MRI 06/23/2021 FINDINGS: Brain: There is no acute intracranial hemorrhage mass effect. Small areas of cortical infarction the MRI are not well seen. No new loss of gray-white differentiation. Stable findings of mild chronic microvascular ischemic changes in the cerebral white matter. Chronic right thalamic infarct. Ventricles are stable in size. Vascular: No new finding. Skull: Calvarium is unremarkable. Sinuses/Orbits: No acute finding.  Other: None. IMPRESSION: No acute intracranial hemorrhage. Small infarcts on prior MRI are not seen. Electronically Signed   By: Macy Mis M.D.   On: 06/24/2021 14:17   CT HEAD WO CONTRAST  Result Date: 06/22/2021 CLINICAL DATA:  Syncope. EXAM: CT HEAD WITHOUT CONTRAST TECHNIQUE: Contiguous axial images were obtained from the base of the skull through the vertex without intravenous contrast. COMPARISON:  November 07, 2015 FINDINGS: Brain: There is mild cerebral atrophy with widening of the extra-axial spaces and ventricular dilatation. There are areas of decreased attenuation within the white matter tracts of the supratentorial brain, consistent with microvascular disease changes. Small chronic bilateral para thalamic lacunar infarcts are seen. Vascular: No hyperdense vessel or unexpected calcification. Skull: Normal. Negative for fracture or focal lesion. Sinuses/Orbits: No acute finding. Other: None. IMPRESSION: 1. No acute intracranial abnormality. 2. Generalized cerebral atrophy with chronic microvascular white matter disease. Electronically Signed   By: Virgina Norfolk M.D.   On: 06/22/2021 19:40   CT ANGIO NECK W OR WO CONTRAST  Result Date: 06/26/2021 CLINICAL DATA:  Stroke/TIA. Determine embolic source. Carotid stenosis on ultrasound. EXAM: CT ANGIOGRAPHY NECK TECHNIQUE: Multidetector CT imaging of the neck was performed using the standard protocol during bolus administration of intravenous contrast. Multiplanar CT image reconstructions and MIPs were obtained to evaluate the vascular anatomy. Carotid stenosis measurements (when applicable) are obtained utilizing NASCET criteria, using the distal internal carotid diameter as the denominator. CONTRAST:  93m OMNIPAQUE IOHEXOL 350 MG/ML SOLN COMPARISON:  MRI studies done August 19 than August 20th. FINDINGS: Aortic arch: Aortic atherosclerosis. Branching pattern is normal. Origin detail is limited because of motion and artifact from dense adjacent  venous contrast. Right carotid system: Common carotid artery is occluded just beyond its origin. Reconstituted flow is seen in the distal common carotid artery, presumably from external to internal collaterals. At the carotid bifurcation and ICA bulb, minimal diameter is 2.8 mm. Compared to a more distal cervical ICA diameter of the same, there is no additional stenosis at this level. Flow is present through the skull base and carotid siphon region. There is extensive siphon atherosclerotic calcification with stenosis estimated at 30-50% in that region. Left carotid system: Common carotid artery shows scattered plaque but is sufficiently patent to the bifurcation region. Soft and calcified plaque is present at the carotid bifurcation and ICA bulb. Minimal diameter is 2.3 mm. Compared to a more distal cervical ICA diameter of 4.7 mm, this represents a 50% stenosis. Beyond that, the vessel  is patent through the skull base and siphon regions. There is severe stenosis of the external carotid artery origin. Atherosclerotic calcification present in the carotid siphon region but without stenosis greater than 50% suspected. I do not identify a cavernous ICA aneurysm. Vertebral arteries: Vertebral artery origin detail is limited because of motion and dense venous contrast. There is calcified plaque at both vertebral artery origins. There is estimated 50% stenosis at each vertebral artery origin. Beyond that, the vessel shows scattered plaque but are sufficiently patent through the cervical region, through the foramen magnum to the basilar. There is atherosclerotic plaque in both vertebral artery V4 segments with stenosis estimated at 30 % on both sides. No basilar stenosis is seen. Skeleton: Ordinary cervical spondylosis. Previous fusion procedure at C5-6, with probable solid union. Other neck: No mass or lymphadenopathy. Upper chest: Mild scarring at the lung apices. IMPRESSION: In this patient with acute embolic strokes in  the left hemisphere, there is considerable atherosclerotic disease at the carotid bifurcation and ICA bulb. There is 50% stenosis of the ICA in the bulb. Severe stenosis of the ECA at its origin. Occlusion of the right common carotid artery proximally. Reconstitution in the bifurcation region presumably from external to internal collaterals. Atherosclerotic disease at the bifurcation and ICA bulb but the vessel diameter in the bulb is the same as the more distal cervical ICA, therefore there is no actual stenosis by NASCET criteria. Atherosclerotic disease in both carotid siphon regions with stenosis estimated at 30-50% on both sides. Atherosclerotic disease at both vertebral artery origins with stenosis estimated at 50% on both sides. Electronically Signed   By: Nelson Chimes M.D.   On: 06/26/2021 15:26   MR ANGIO HEAD WO CONTRAST  Result Date: 06/24/2021 CLINICAL DATA:  Acute strokes on MRI brain fall EXAM: MRA NECK WITHOUT AND WITH CONTRAST MRA HEAD WITHOUT CONTRAST TECHNIQUE: Multiplanar and multiecho pulse sequences of the neck were obtained without and with intravenous contrast. Angiographic images of the neck were obtained using MRA technique without and with intravenous contrast; Angiographic images of the Circle of Willis were obtained using MRA technique without intravenous contrast. CONTRAST:  7.30m GADAVIST GADOBUTROL 1 MMOL/ML IV SOLN COMPARISON:  None. FINDINGS: MRA NECK FINDINGS There is absent flow within the right common carotid just beyond the origin. There is reconstitution disc proximal to the bifurcation. Right internal and external carotid arteries are patent. No hemodynamically significant stenosis of the right proximal ICA. Common, internal, and external carotid arteries are patent on the left. No hemodynamically significant stenosis of the left proximal ICA. Extracranial vertebral arteries are patent. MRA HEAD FINDINGS Motion artifact present. There is preserved flow related enhancement  intracranial internal arteries with atherosclerotic irregularity. Possible 3 mm inferolaterally directed outpouching from distal left cavernous ICA. Anterior and middle cerebral arteries are patent. Intracranial vertebral arteries are patent. Basilar artery is patent. Major cerebellar artery origins are patent right posterior communicating artery is present. Posterior cerebral arteries are patent. IMPRESSION: Loss of flow within the right common carotid just beyond the origin with reconstitution proximal to the bifurcation. Right internal carotid artery is patent. No hemodynamically significant stenosis the carotid bifurcations. No proximal intracranial vessel occlusion or significant stenosis. There is atherosclerotic irregularity of the internal carotids. Possible 3 mm inferolaterally directed aneurysm of the distal cavernous left ICA. Electronically Signed   By: PMacy MisM.D.   On: 06/24/2021 15:54   MR ANGIO NECK W WO CONTRAST  Result Date: 06/24/2021 CLINICAL DATA:  Acute strokes on MRI  brain fall EXAM: MRA NECK WITHOUT AND WITH CONTRAST MRA HEAD WITHOUT CONTRAST TECHNIQUE: Multiplanar and multiecho pulse sequences of the neck were obtained without and with intravenous contrast. Angiographic images of the neck were obtained using MRA technique without and with intravenous contrast; Angiographic images of the Circle of Willis were obtained using MRA technique without intravenous contrast. CONTRAST:  7.74m GADAVIST GADOBUTROL 1 MMOL/ML IV SOLN COMPARISON:  None. FINDINGS: MRA NECK FINDINGS There is absent flow within the right common carotid just beyond the origin. There is reconstitution disc proximal to the bifurcation. Right internal and external carotid arteries are patent. No hemodynamically significant stenosis of the right proximal ICA. Common, internal, and external carotid arteries are patent on the left. No hemodynamically significant stenosis of the left proximal ICA. Extracranial vertebral  arteries are patent. MRA HEAD FINDINGS Motion artifact present. There is preserved flow related enhancement intracranial internal arteries with atherosclerotic irregularity. Possible 3 mm inferolaterally directed outpouching from distal left cavernous ICA. Anterior and middle cerebral arteries are patent. Intracranial vertebral arteries are patent. Basilar artery is patent. Major cerebellar artery origins are patent right posterior communicating artery is present. Posterior cerebral arteries are patent. IMPRESSION: Loss of flow within the right common carotid just beyond the origin with reconstitution proximal to the bifurcation. Right internal carotid artery is patent. No hemodynamically significant stenosis the carotid bifurcations. No proximal intracranial vessel occlusion or significant stenosis. There is atherosclerotic irregularity of the internal carotids. Possible 3 mm inferolaterally directed aneurysm of the distal cavernous left ICA. Electronically Signed   By: PMacy MisM.D.   On: 06/24/2021 15:54   MR BRAIN WO CONTRAST  Result Date: 06/23/2021 CLINICAL DATA:  Loss of consciousness with leg pain and numbness EXAM: MRI HEAD WITHOUT CONTRAST TECHNIQUE: Multiplanar, multiecho pulse sequences of the brain and surrounding structures were obtained without intravenous contrast. COMPARISON:  None. FINDINGS: Brain: Small foci abnormal diffusion restriction within the superior left frontal and parietal lobes. No acute or chronic hemorrhage. There is multifocal hyperintense T2-weighted signal within the white matter. Generalized volume loss without a clear lobar predilection. The midline structures are normal. Vascular: Major flow voids are preserved. Skull and upper cervical spine: Normal calvarium and skull base. Visualized upper cervical spine and soft tissues are normal. Sinuses/Orbits:No paranasal sinus fluid levels or advanced mucosal thickening. No mastoid or middle ear effusion. Normal orbits.  IMPRESSION: 1. Small foci of acute ischemia within the superior left frontal and parietal lobes. No hemorrhage or mass effect. 2. Findings of chronic ischemic microangiopathy and generalized volume loss. Electronically Signed   By: KUlyses JarredM.D.   On: 06/23/2021 02:47   CT ABDOMEN PELVIS W CONTRAST  Result Date: 06/24/2021 CLINICAL DATA:  Acute, nonlocalized abdominal pain EXAM: CT ABDOMEN AND PELVIS WITH CONTRAST TECHNIQUE: Multidetector CT imaging of the abdomen and pelvis was performed using the standard protocol following bolus administration of intravenous contrast. CONTRAST:  721mOMNIPAQUE IOHEXOL 350 MG/ML SOLN COMPARISON:  06/22/2021 FINDINGS: Lower chest:  Coronary atherosclerosis.  Dependent atelectasis. Hepatobiliary: No focal liver abnormality.Vicarious excretion of contrast into the gallbladder. Pancreas: Diffuse atrophy Spleen: Unremarkable. Adrenals/Urinary Tract: Negative adrenals. No hydronephrosis or stone. No acute renal infarct. Bilateral renal cortical thinning/scarring which is intermittently seen. Unremarkable bladder. Stomach/Bowel: Submucosal low-density thickening of the sigmoid and rectum. The IMA and IMV are enhancing. The IMA is disease with atheromatous plaque but not clearly changed. No bowel obstruction or perforation. Rounded high-density within pelvic small bowel loops is presumably ingested Vascular/Lymphatic: Remarkably extensive atheromatous disease  of the aorta and branch vessels with SMA, aortic, and proximal iliac stenting. Advanced bilateral outflow disease as well. No acute occlusion of stents is seen, although limited by technique. There is retroperitoneal edema/hemorrhage which is bilateral and not clearly contiguous with mild in expected stranding at femoral access sites. Heavily diseased IMA which is not covered by stenting. Patent venous structures. Reproductive:Hysterectomy Other: No ascites or pneumoperitoneum. Musculoskeletal: No acute abnormalities. Page  is being placed at time of signing. IMPRESSION: 1. Retroperitoneal edema/hemorrhage not contiguous with the right femoral access site, presumably from recent arterial manipulation. No active extravasation. Grossly patent stents on this study not optimized for the arterial structures. 2. Colitis of the sigmoid and rectum. Ischemic colitis is presumed given the severely diseased IMA which is now covered. Electronically Signed   By: Monte Fantasia M.D.   On: 06/24/2021 09:58   US Carotid Bilateral (at [facility] and [facility] only)  Result Date: 06/23/2021 CLINICAL DATA:  Syncope EXAM: BILATERAL CAROTID DUPLEX ULTRASOUND TECHNIQUE: Pearline Cables scale imaging, color Doppler and duplex ultrasound were performed of bilateral carotid and vertebral arteries in the neck. COMPARISON:  None. FINDINGS: Criteria: Quantification of carotid stenosis is based on velocity parameters that correlate the residual internal carotid diameter with NASCET-based stenosis levels, using the diameter of the distal internal carotid lumen as the denominator for stenosis measurement. The following velocity measurements were obtained: RIGHT ICA: 75/31 cm/sec CCA: [id number] cm/sec SYSTOLIC ICA/CCA RATIO:  3.8 ECA: 100 cm/sec LEFT ICA: 190/46 cm/sec CCA: [id number] cm/sec SYSTOLIC ICA/CCA RATIO:  1.2 ECA: 246 cm/sec RIGHT CAROTID ARTERY: There is near occlusion of the proximal right common carotid artery which results in slow/trickle flow/bidirectional flow into the more distal common carotid artery. There is relative elevated velocities seen in the internal carotid artery, consistent with severe proximal stenosis. RIGHT VERTEBRAL ARTERY:  Antegrade flow LEFT CAROTID ARTERY: Diffuse moderate atherosclerotic plaque results in less than 50% stenosis. LEFT VERTEBRAL ARTERY:  Antegrade flow Upper extremity blood pressures: RIGHT:  LEFT: IMPRESSION: 1. There is near occlusion of the proximal right common carotid artery. 2. No findings to suggest hemodynamically significant  stenosis in the left carotid circulation by Doppler criteria. Diffuse moderate atherosclerotic plaque burden in the left carotid arteries results in less than 50% stenosis. 3. Antegrade flow in the bilateral vertebral arteries. These results will be called to the ordering clinician or representative by the Radiologist Assistant, and communication documented in the PACS or [employer]. Electronically Signed   By: Albin Felling M.D.   On: 06/23/2021 08:55   PERIPHERAL VASCULAR CATHETERIZATION  Result Date: 06/23/2021 See surgical note for result.  ECHOCARDIOGRAM COMPLETE  Result Date: 06/27/2021    ECHOCARDIOGRAM REPORT   Patient Name:   Autumn Arnold Date of Exam: 06/24/2021 Medical Rec #:  [id number]      Height:       59.0 in Accession #:    [id number]     Weight:       140.0 lb Date of Birth:  01-15-1951       BSA:          1.585 m Patient Age:    68 years       BP:           100/52 mmHg Patient Gender: F              HR:           105 bpm. Exam Location:  [facility] Procedure: 2D Echo and Strain Analysis Indications:  Syncope R55  History:         Patient has prior history of Echocardiogram examinations, most                  recent 11/07/2015.  Sonographer:     Kathlen Brunswick RDCS Referring Phys:  [id number] [place] Diagnosing Phys: Neoma Laming  Sonographer Comments: Global longitudinal strain was attempted. IMPRESSIONS  1. Left ventricular ejection fraction, by estimation, is 60 to 65%. The left ventricle has normal function. The left ventricle has no regional wall motion abnormalities. There is mild concentric left ventricular hypertrophy. Left ventricular diastolic parameters were normal.  2. Right ventricular systolic function is normal. The right ventricular size is normal.  3. The mitral valve is normal in structure. Trivial mitral valve regurgitation. No evidence of mitral stenosis.  4. The aortic valve is normal in structure. Aortic valve regurgitation is not visualized. No aortic  stenosis is present.  5. The inferior vena cava is normal in size with greater than 50% respiratory variability, suggesting right atrial pressure of 3 mmHg. FINDINGS  Left Ventricle: Left ventricular ejection fraction, by estimation, is 60 to 65%. The left ventricle has normal function. The left ventricle has no regional wall motion abnormalities. The left ventricular internal cavity size was normal in size. There is  mild concentric left ventricular hypertrophy. Left ventricular diastolic parameters were normal. Right Ventricle: The right ventricular size is normal. No increase in right ventricular wall thickness. Right ventricular systolic function is normal. Left Atrium: Left atrial size was normal in size. Right Atrium: Right atrial size was normal in size. Pericardium: There is no evidence of pericardial effusion. Mitral Valve: The mitral valve is normal in structure. Trivial mitral valve regurgitation. No evidence of mitral valve stenosis. Tricuspid Valve: The tricuspid valve is normal in structure. Tricuspid valve regurgitation is trivial. No evidence of tricuspid stenosis. Aortic Valve: The aortic valve is normal in structure. Aortic valve regurgitation is not visualized. Aortic regurgitation PHT measures 592 msec. No aortic stenosis is present. Aortic valve peak gradient measures 10.2 mmHg. Pulmonic Valve: The pulmonic valve was normal in structure. Pulmonic valve regurgitation is not visualized. No evidence of pulmonic stenosis. Aorta: The aortic root is normal in size and structure. Venous: The inferior vena cava is normal in size with greater than 50% respiratory variability, suggesting right atrial pressure of 3 mmHg. IAS/Shunts: No atrial level shunt detected by color flow Doppler.  LEFT VENTRICLE PLAX 2D LVIDd:         4.42 cm  Diastology LVIDs:         3.07 cm  LV e' medial:    6.31 cm/s LV PW:         1.07 cm  LV E/e' medial:  10.0 LV IVS:        0.96 cm  LV e' lateral:   7.62 cm/s LVOT diam:      1.70 cm  LV E/e' lateral: 8.3 LV SV:         50 LV SV Index:   32 LVOT Area:     2.27 cm  RIGHT VENTRICLE RV Basal diam:  2.21 cm RV S prime:     17.40 cm/s TAPSE (M-mode): 2.1 cm LEFT ATRIUM             Index       RIGHT ATRIUM           Index LA diam:        3.00 cm 1.89 cm/m  RA Area:     10.70 cm LA Vol (A2C):   25.1 ml 15.84 ml/m RA Volume:   20.10 ml  12.68 ml/m LA Vol (A4C):   25.8 ml 16.28 ml/m LA Biplane Vol: 26.7 ml 16.85 ml/m  AORTIC VALVE AV Area (Vmax): 1.63 cm AV Vmax:        160.00 cm/s AV Peak Grad:   10.2 mmHg LVOT Vmax:      115.00 cm/s LVOT Vmean:     71.400 cm/s LVOT VTI:       0.220 m AI PHT:         592 msec  AORTA Ao Root diam: 2.50 cm MITRAL VALVE MV Area (PHT): 4.99 cm     SHUNTS MV Decel Time: 152 msec     Systemic VTI:  0.22 m MV E velocity: 63.40 cm/s   Systemic Diam: 1.70 cm MV A velocity: 112.00 cm/s MV E/A ratio:  0.57 Shaukat Khan Electronically signed by Neoma Laming Signature Date/Time: 06/27/2021/10:34:44 AM    Final    CT Angio Chest/Abd/Pel for Dissection W and/or Wo Contrast  Result Date: 06/22/2021 CLINICAL DATA:  Evaluate dissection. Syncope, poor perfusion to bilateral feet and right hand. eval dissection. Smoker/vasculopath. syncopal episode. Pt had urinary incontinence during syncopal episode. Episode was witnessed. EXAM: CT ANGIOGRAPHY CHEST, ABDOMEN AND PELVIS TECHNIQUE: Non-contrast CT of the chest was initially obtained. Multidetector CT imaging through the chest, abdomen and pelvis was performed using the standard protocol during bolus administration of intravenous contrast. Multiplanar reconstructed images and MIPs were obtained and reviewed to evaluate the vascular anatomy. CONTRAST:  119m OMNIPAQUE IOHEXOL 350 MG/ML SOLN COMPARISON:  CT abdomen pelvis 11/27/2019, CT chest 09/16/2019 FINDINGS: CTA CHEST FINDINGS Cardiovascular: Preferential opacification of the thoracic aorta. No evidence of thoracic aortic aneurysm or dissection. At least moderate  atherosclerotic plaque of the thoracic aorta. Normal heart size. No significant pericardial effusion. Four-vessel coronary artery calcifications. The main pulmonary artery is normal in caliber. No central pulmonary embolus. Mediastinum/Nodes: No enlarged mediastinal, hilar, or axillary lymph nodes. Thyroid gland, trachea, and esophagus demonstrate no significant findings. Lungs/Pleura: No focal consolidation. No pulmonary nodule. No pulmonary mass. No pleural effusion. No pneumothorax. Musculoskeletal: No chest wall abnormality. No suspicious lytic or blastic osseous lesions. No acute displaced fracture. Review of the MIP images confirms the above findings. CTA ABDOMEN AND PELVIS FINDINGS VASCULAR Aorta: Severe calcified and noncalcified atherosclerotic plaque. Focal region of moderate to severe stenosis of the infrarenal abdominal aortic caliber extending for approximately 0.5 cm in craniocaudal dimension (8:99, 5:108) due to calcified and noncalcified atherosclerotic plaque. Normal caliber aorta without aneurysm, dissection, vasculitis or significant stenosis. Celiac: Atherosclerotic plaque. Patent without evidence of aneurysm, dissection, vasculitis or significant stenosis. SMA: Atherosclerotic plaque. Patent without evidence of aneurysm, dissection, vasculitis or significant stenosis. Renals: Atherosclerotic plaque. Both renal arteries are patent without evidence of aneurysm, dissection, vasculitis, fibromuscular dysplasia or significant stenosis. IMA: Atherosclerotic plaque. Patent without evidence of aneurysm, dissection, vasculitis or significant stenosis. Inflow: Focal severe stenosis of the proximal right common iliac artery (5:126, 7:75). Similarly focal severe stenosis of the distal left common iliac artery (8:106, 5:1 30-131). Otherwise patent without evidence of aneurysm, dissection, vasculitis. Veins: No obvious venous abnormality within the limitations of this arterial phase study. Review of the MIP  images confirms the above findings. NON-VASCULAR Hepatobiliary: No focal liver abnormality. No gallstones, gallbladder wall thickening, or pericholecystic fluid. No biliary dilatation. Pancreas: No focal lesion. Normal pancreatic contour. No surrounding inflammatory changes. No main pancreatic ductal  dilatation. Spleen: Normal in size without focal abnormality. Adrenals/Urinary Tract: No adrenal nodule bilaterally. Bilateral kidneys enhance symmetrically. No hydronephrosis. No hydroureter. The urinary bladder is unremarkable. Stomach/Bowel: Stomach is within normal limits. No evidence of bowel wall thickening or dilatation. The appendix not definitely identified. Lymphatic: No lymphadenopathy. Reproductive: Status post hysterectomy. No adnexal masses. Other: No intraperitoneal free fluid. No intraperitoneal free gas. No organized fluid collection. Musculoskeletal: No abdominal wall hernia or abnormality. No suspicious lytic or blastic osseous lesions. No acute displaced fracture. Review of the MIP images confirms the above findings. IMPRESSION: 1. No aortic dissection or aneurysm. 2. Severe atherosclerotic plaque with several areas of focal moderate to severe stenosis of the infrarenal abdominal aorta as well as bilateral common iliac arteries. Aortic Atherosclerosis (ICD10-I70.0). 3. Otherwise no acute intrathoracic, intra-abdominal, intrapelvic abnormality. Electronically Signed   By: Iven Finn M.D.   On: 06/22/2021 22:31     Assessment/Plan There are no diagnoses linked to this encounter.   Hortencia Pilar, MD  07/17/2021 1:18 PM

## 2021-07-21 ENCOUNTER — Telehealth (INDEPENDENT_AMBULATORY_CARE_PROVIDER_SITE_OTHER): Payer: Self-pay

## 2021-07-21 NOTE — Telephone Encounter (Signed)
Patient sister was made aware with medical recommendations and verbalized understanding

## 2021-07-21 NOTE — Telephone Encounter (Signed)
Patient had a recent procedure and some swelling and discomfort is not abnormal.  The patient had a recent ultrasound which shows that the blood flow in the right lower extremity is not compromised.  The patient should utilize Tylenol for pain relief as well as elevate her lower extremity.

## 2021-07-24 ENCOUNTER — Ambulatory Visit (INDEPENDENT_AMBULATORY_CARE_PROVIDER_SITE_OTHER): Payer: Medicare HMO | Admitting: Vascular Surgery

## 2021-07-27 ENCOUNTER — Ambulatory Visit (INDEPENDENT_AMBULATORY_CARE_PROVIDER_SITE_OTHER): Payer: Medicare HMO

## 2021-07-27 ENCOUNTER — Other Ambulatory Visit (INDEPENDENT_AMBULATORY_CARE_PROVIDER_SITE_OTHER): Payer: Self-pay | Admitting: Vascular Surgery

## 2021-07-27 ENCOUNTER — Ambulatory Visit (INDEPENDENT_AMBULATORY_CARE_PROVIDER_SITE_OTHER): Payer: Medicare HMO | Admitting: Vascular Surgery

## 2021-07-27 ENCOUNTER — Encounter (INDEPENDENT_AMBULATORY_CARE_PROVIDER_SITE_OTHER): Payer: Self-pay | Admitting: Vascular Surgery

## 2021-07-27 ENCOUNTER — Other Ambulatory Visit: Payer: Self-pay

## 2021-07-27 VITALS — BP 99/57 | HR 90 | Resp 16 | Wt 142.6 lb

## 2021-07-27 DIAGNOSIS — I6523 Occlusion and stenosis of bilateral carotid arteries: Secondary | ICD-10-CM | POA: Diagnosis not present

## 2021-07-27 DIAGNOSIS — I739 Peripheral vascular disease, unspecified: Secondary | ICD-10-CM | POA: Diagnosis not present

## 2021-07-27 DIAGNOSIS — E1169 Type 2 diabetes mellitus with other specified complication: Secondary | ICD-10-CM

## 2021-07-27 DIAGNOSIS — E782 Mixed hyperlipidemia: Secondary | ICD-10-CM

## 2021-07-27 DIAGNOSIS — Z9889 Other specified postprocedural states: Secondary | ICD-10-CM

## 2021-07-27 DIAGNOSIS — E785 Hyperlipidemia, unspecified: Secondary | ICD-10-CM

## 2021-07-27 DIAGNOSIS — M79671 Pain in right foot: Secondary | ICD-10-CM

## 2021-07-27 DIAGNOSIS — K551 Chronic vascular disorders of intestine: Secondary | ICD-10-CM

## 2021-07-27 DIAGNOSIS — I70213 Atherosclerosis of native arteries of extremities with intermittent claudication, bilateral legs: Secondary | ICD-10-CM

## 2021-07-27 NOTE — Progress Notes (Signed)
MRN : [id number]  Autumn Arnold is a 70 y.o. (11-28-1950) female who presents with chief complaint of check up.  History of Present Illness:   The patient returns to the office for followup and review status post angiogram with intervention.   Procedure 06/23/2021:  1.  Percutaneous transluminal angioplasty and stent placement with a 7 x 16 lifestream stent superior mesenteric artery.  2.  Percutaneous transluminal angioplasty and stent placement using a 9 mm x 58 mm lifestream stent mid infrarenal aorta 3.  Percutaneous transluminal angioplasty and stent placement right common iliac artery with 6 mm lifestream stents postdilated to 7 mm; "kissing balloon" technique 4.  Percutaneous transluminal and plasty and stent placement left common iliac arterywith 6 mm lifestream stents postdilated to 7 mm; "kissing balloon" technique  The patient notes improvement in the lower extremity symptoms. No interval shortening of the patient's claudication distance or rest pain symptoms. Previous wounds have now healed.  No new ulcers or wounds have occurred since the last visit.  She denies post prandial pain and has not been loosing weight.  Tolerating diet well.  There have been no significant changes to the patient's overall health care.  The patient denies amaurosis fugax or recent TIA symptoms. There are no recent neurological changes noted. The patient denies history of DVT, PE or superficial thrombophlebitis. The patient denies recent episodes of angina or shortness of breath.   ABI's Rt=0.98 and Lt=0.51  (previous ABI's Rt=0.92 and Lt=0.70)    No outpatient medications have been marked as taking for the 07/27/21 encounter (Appointment) with Delana Meyer, Dolores Lory, MD.    Past Medical History:  Diagnosis Date   Anxiety    Arthritis    Asthma    Bilateral lower extremity edema    COPD (chronic obstructive pulmonary disease) ([place])    Cough    CHRONIC   Diabetes mellitus 2008   Edema     LEGS/FEET   GERD (gastroesophageal reflux disease)    Hyperlipidemia    Hypertension    Shortness of breath dyspnea    DOE   Sleep apnea    CPAP at [facility], Dr. Humphrey Rolls   Stroke [facility]) 02/2010   headache, left arm numbness   Wheezing     Past Surgical History:  Procedure Laterality Date   ABDOMINAL HYSTERECTOMY     ANTERIOR FUSION CERVICAL SPINE     CARPAL TUNNEL RELEASE     Bilateral   CATARACT EXTRACTION W/PHACO Left 03/12/2016   Procedure: CATARACT EXTRACTION PHACO AND INTRAOCULAR LENS PLACEMENT ([place]);  Surgeon: Estill Cotta, MD;  Location: [facility] ORS;  Service: Ophthalmology;  Laterality: Left;  [place] 01:12 AP% 23.4 CDE 29.83 fluid pack lot # [id number]   CATARACT EXTRACTION W/PHACO Right 04/16/2016   Procedure: CATARACT EXTRACTION PHACO AND INTRAOCULAR LENS PLACEMENT ([facility]);  Surgeon: Estill Cotta, MD;  Location: [facility] ORS;  Service: Ophthalmology;  Laterality: Right;  [place] 01:17 AP% 22.5 CDE 35.08 Fluid pack lot # [id number]   CESAREAN SECTION     COLONOSCOPY WITH PROPOFOL N/A 08/12/2018   Procedure: COLONOSCOPY WITH PROPOFOL;  Surgeon: Toledo, Benay Pike, MD;  Location: [facility] ENDOSCOPY;  Service: Gastroenterology;  Laterality: N/A;   ELBOW SURGERY     Tendonitis   ESOPHAGOGASTRODUODENOSCOPY (EGD) WITH PROPOFOL N/A 08/12/2018   Procedure: ESOPHAGOGASTRODUODENOSCOPY (EGD) WITH PROPOFOL;  Surgeon: Toledo, Benay Pike, MD;  Location: [facility] ENDOSCOPY;  Service: Gastroenterology;  Laterality: N/A;   INCONTINENCE SURGERY     LOWER EXTREMITY ANGIOGRAPHY Left 06/23/2021  Procedure: Lower Extremity Angiography;  Surgeon: Katha Cabal, MD;  Location: [place] [facility];  Service: Cardiovascular;  Laterality: Left;   TONSILLECTOMY      Social History Social History   Tobacco Use   Smoking status: Every Day    Packs/day: 1.00    Types: Cigarettes   Smokeless tobacco: Never  Substance Use Topics   Alcohol use: Yes    Alcohol/week: 7.0 standard drinks    Types: 7 Cans of beer per  week   Drug use: No    Family History Family History  Problem Relation Age of Onset   CAD Mother    Hypertension Mother    Arthritis Mother    Diabetes Father    CAD Father     Allergies  Allergen Reactions   Lisinopril Other (See Comments)    Tongue burning sensation     REVIEW OF SYSTEMS (Negative unless checked)  Constitutional: [] Weight loss  [] Fever  [] Chills Cardiac: [] Chest pain   [] Chest pressure   [] Palpitations   [] Shortness of breath when laying flat   [] Shortness of breath with exertion. Vascular:  [x] Pain in legs with walking   [] Pain in legs at rest  [] History of DVT   [] Phlebitis   [] Swelling in legs   [] Varicose veins   [] Non-healing ulcers Pulmonary:   [] Uses home oxygen   [] Productive cough   [] Hemoptysis   [] Wheeze  [] COPD   [] Asthma Neurologic:  [] Dizziness   [] Seizures   [] History of stroke   [] History of TIA  [] Aphasia   [] Vissual changes   [] Weakness or numbness in arm   [] Weakness or numbness in leg Musculoskeletal:   [] Joint swelling   [] Joint pain   [] Low back pain Hematologic:  [] Easy bruising  [] Easy bleeding   [] Hypercoagulable state   [] Anemic Gastrointestinal:  [] Diarrhea   [] Vomiting  [] Gastroesophageal reflux/heartburn   [] Difficulty swallowing. Genitourinary:  [] Chronic kidney disease   [] Difficult urination  [] Frequent urination   [] Blood in urine Skin:  [] Rashes   [] Ulcers  Psychological:  [] History of anxiety   []  History of major depression.  Physical Examination  There were no vitals filed for this visit. There is no height or weight on file to calculate BMI. Gen: WD/WN, NAD Head: [place]/AT, No temporalis wasting.  Ear/Nose/Throat: Hearing grossly intact, nares w/o erythema or drainage Eyes: PER, EOMI, sclera nonicteric.  Neck: Supple, no masses.  No bruit or JVD.  Pulmonary:  Good air movement, no audible wheezing, no use of accessory muscles.  Cardiac: RRR, normal S1, S2, no Murmurs. Vascular:   bilateral carotid bruits no abdominal  bruit Vessel Right Left  Radial Palpable Palpable  Carotid Palpable Palpable  PT Trace Palpable Not Palpable  DP Not Palpable Not Palpable  Gastrointestinal: soft, non-distended. No guarding/no peritoneal signs.  Musculoskeletal: M/S 5/5 throughout.  No visible deformity.  Neurologic: CN 2-12 intact. Pain and light touch intact in extremities.  Symmetrical.  Speech is fluent. Motor exam as listed above. Psychiatric: Judgment intact, Mood & affect appropriate for pt's clinical situation. Dermatologic: No rashes or ulcers noted.  No changes consistent with cellulitis.   CBC Lab Results  Component Value Date   WBC 7.7 06/28/2021   HGB 9.5 (L) 06/28/2021   HCT 28.0 (L) 06/28/2021   MCV 88.9 06/28/2021   PLT 229 06/28/2021    BMET    Component Value Date/Time   NA 138 06/28/2021 0545   K 3.6 06/28/2021 0545   CL 105 06/28/2021 0545   CO2  25 06/28/2021 0545   GLUCOSE 120 (H) 06/28/2021 0545   BUN 8 06/28/2021 0545   CREATININE 0.69 06/28/2021 0545   CALCIUM 8.4 (L) 06/28/2021 0545   GFRNONAA >60 06/28/2021 0545   GFRAA >60 11/08/2015 0419   CrCl cannot be calculated (Patient's most recent lab result is older than the maximum 21 days allowed.).  COAG Lab Results  Component Value Date   INR 1.0 06/22/2021   INR 0.89 11/07/2015    Radiology VAS [place] ABI WITH/WO TBI  Result Date: 07/20/2021  LOWER EXTREMITY DOPPLER STUDY Patient Name:  Autumn Arnold  Date of Exam:   07/17/2021 Medical Rec #: [id number]       Accession #:    [id number] Date of Birth: 1951/05/30        Patient Gender: F Patient Age:   31 years Exam [facility] Procedure:      VAS [place] ABI WITH/WO TBI Referring Phys: Hortencia Pilar --------------------------------------------------------------------------------   Performing Technologist: Almira Coaster RVS  Examination Guidelines: A complete evaluation includes at minimum, Doppler waveform signals and systolic blood pressure reading at the level  of bilateral brachial, anterior tibial, and posterior tibial arteries, when vessel segments are accessible. Bilateral testing is considered an integral part of a complete examination. Photoelectric Plethysmograph (PPG) waveforms and toe systolic pressure readings are included as required and additional duplex testing as needed. Limited examinations for reoccurring indications may be performed as noted.  ABI Findings: +---------+------------------+-----+--------+--------+ Right    Rt Pressure (mmHg)IndexWaveformComment  +---------+------------------+-----+--------+--------+ Brachial 131                                     +---------+------------------+-----+--------+--------+ ATA      133                    biphasic.92      +---------+------------------+-----+--------+--------+ PTA      133               0.92 biphasic         +---------+------------------+-----+--------+--------+ Great Toe77                0.53 Abnormal         +---------+------------------+-----+--------+--------+ +---------+------------------+-----+----------+-------+ Left     Lt Pressure (mmHg)IndexWaveform  Comment +---------+------------------+-----+----------+-------+ Brachial 145                                      +---------+------------------+-----+----------+-------+ ATA      94                     monophasic.65     +---------+------------------+-----+----------+-------+ PTA      102               0.70 monophasic        +---------+------------------+-----+----------+-------+ Great Toe58                0.40 Abnormal          +---------+------------------+-----+----------+-------+ +-------+-----------+-----------+------------+------------+ ABI/TBIToday's ABIToday's TBIPrevious ABIPrevious TBI +-------+-----------+-----------+------------+------------+ Right  .92        .53                                 +-------+-----------+-----------+------------+------------+ Left    .70        .  40                                 +-------+-----------+-----------+------------+------------+  Summary: Right: Resting right ankle-brachial index indicates mild right lower extremity arterial disease. The right toe-brachial index is abnormal. Left: Resting left ankle-brachial index indicates moderate left lower extremity arterial disease. The left toe-brachial index is abnormal.  *See table(s) above for measurements and observations.  Electronically signed by Hortencia Pilar MD on 07/20/2021 at 7:05:41 PM.    Final      Assessment/Plan 1. Atherosclerosis of native artery of both lower extremities with intermittent claudication (HCC) Recommend:  The patient is status post successful angiogram with intervention.  The patient reports that the claudication symptoms and leg pain is essentially gone.   The patient denies lifestyle limiting changes at this point in time.  No further invasive studies, angiography or surgery at this time The patient should continue walking and begin a more formal exercise program.  The patient should continue antiplatelet therapy and aggressive treatment of the lipid abnormalities  Smoking cessation was again discussed  The patient should continue wearing graduated compression socks 10-15 mmHg strength to control the mild edema.  Patient should undergo noninvasive studies as ordered. The patient will follow up with me after the studies.    - VAS [place] ABI WITH/WO TBI; Future  2. 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HCC) Recommend:  The patient is status post successful angiogram with intervention of the mesenteric vessels.  SMA stent  was performed.  The patient reports that the abdominal pain is improved and the post prandial symptoms are essentially gone.   The patient denies lifestyle limiting changes at this point in time.  No further invasive studies, angiography or surgery at this time The patient should continue walking and begin a more formal exercise  program.  The patient should continue antiplatelet therapy and aggressive treatment of the lipid abnormalities  Smoking cessation was again discussed  Patient should undergo noninvasive studies as ordered. The patient will follow up with me after the studies.    - VAS [place] MESENTERIC; Future  3. Bilateral carotid artery stenosis Recommend:  Given the patient's asymptomatic subcritical stenosis no further invasive testing or surgery at this time.  Continue antiplatelet therapy as prescribed Continue management of CAD, HTN and Hyperlipidemia Healthy heart diet,  encouraged exercise at least 4 times per week Follow up in 6 months with duplex ultrasound and physical exam   - VAS US CAROTID; Future  4. Type 2 diabetes mellitus with hyperlipidemia (HCC) Continue hypoglycemic medications as already ordered, these medications have been reviewed and there are no changes at this time.  Hgb A1C to be monitored as already arranged by primary service   5. Mixed hyperlipidemia Continue statin as ordered and reviewed, no changes at this time      Hortencia Pilar, MD  07/27/2021 1:16 PM

## 2021-07-29 ENCOUNTER — Encounter (INDEPENDENT_AMBULATORY_CARE_PROVIDER_SITE_OTHER): Payer: Self-pay | Admitting: Vascular Surgery

## 2021-07-29 DIAGNOSIS — I6529 Occlusion and stenosis of unspecified carotid artery: Secondary | ICD-10-CM | POA: Insufficient documentation

## 2021-08-01 ENCOUNTER — Telehealth (INDEPENDENT_AMBULATORY_CARE_PROVIDER_SITE_OTHER): Payer: Self-pay

## 2021-08-01 DIAGNOSIS — H532 Diplopia: Secondary | ICD-10-CM | POA: Diagnosis not present

## 2021-08-01 DIAGNOSIS — Z0100 Encounter for examination of eyes and vision without abnormal findings: Secondary | ICD-10-CM | POA: Diagnosis not present

## 2021-08-01 NOTE — Telephone Encounter (Signed)
Patient daughter called yesterday having questions pertaining to her mother carotid blockages,abdominal pain,survival rate with blockages,& several other questions. I spoke with Dr Delana Meyer and he recommended for the them come in for consultation. Patient daughter was made aware and has been schedule.

## 2021-08-09 DIAGNOSIS — F17200 Nicotine dependence, unspecified, uncomplicated: Secondary | ICD-10-CM | POA: Diagnosis not present

## 2021-08-09 DIAGNOSIS — E1142 Type 2 diabetes mellitus with diabetic polyneuropathy: Secondary | ICD-10-CM | POA: Diagnosis not present

## 2021-08-09 DIAGNOSIS — E782 Mixed hyperlipidemia: Secondary | ICD-10-CM | POA: Diagnosis not present

## 2021-08-09 NOTE — Progress Notes (Signed)
MRN : [id number]  Autumn Arnold is a 70 y.o. (Apr 26, 1951) female who presents with chief complaint of discuss circulation.  History of Present Illness:   The patient returns to the office for followup and review status post angiogram with intervention.    Procedure 06/23/2021:  1.  Percutaneous transluminal angioplasty and stent placement with a 7 x 16 lifestream stent superior mesenteric artery.  2.  Percutaneous transluminal angioplasty and stent placement using a 9 mm x 58 mm lifestream stent mid infrarenal aorta 3.  Percutaneous transluminal angioplasty and stent placement right common iliac artery with 6 mm lifestream stents postdilated to 7 mm; "kissing balloon" technique 4.  Percutaneous transluminal and plasty and stent placement left common iliac arterywith 6 mm lifestream stents postdilated to 7 mm; "kissing balloon" technique   No interval shortening of the patient's claudication distance or rest pain symptoms. Previous wounds have now healed.  No new ulcers or wounds have occurred since the last visit.   She denies post prandial pain and has not been loosing weight.  Tolerating diet well.   There have been no significant changes to the patient's overall health care.   The patient denies amaurosis fugax or recent TIA symptoms. There are no recent neurological changes noted. The patient denies history of DVT, PE or superficial thrombophlebitis. The patient denies recent episodes of angina or shortness of breath.    ABI's Rt=0.98 and Lt=0.51  (previous ABI's Rt=0.92 and Lt=0.70)  No outpatient medications have been marked as taking for the 08/10/21 encounter (Appointment) with Delana Meyer, Dolores Lory, MD.    Past Medical History:  Diagnosis Date   Anxiety    Arthritis    Asthma    Bilateral lower extremity edema    COPD (chronic obstructive pulmonary disease) ([place])    Cough    CHRONIC   Diabetes mellitus 2008   Edema    LEGS/FEET   GERD (gastroesophageal reflux disease)     Hyperlipidemia    Hypertension    Shortness of breath dyspnea    DOE   Sleep apnea    CPAP at [facility], Dr. Humphrey Rolls   Stroke [facility]) 02/2010   headache, left arm numbness   Wheezing     Past Surgical History:  Procedure Laterality Date   ABDOMINAL HYSTERECTOMY     ANTERIOR FUSION CERVICAL SPINE     CARPAL TUNNEL RELEASE     Bilateral   CATARACT EXTRACTION W/PHACO Left 03/12/2016   Procedure: CATARACT EXTRACTION PHACO AND INTRAOCULAR LENS PLACEMENT ([place]);  Surgeon: Estill Cotta, MD;  Location: [facility] ORS;  Service: Ophthalmology;  Laterality: Left;  [place] 01:12 AP% 23.4 CDE 29.83 fluid pack lot # [id number]   CATARACT EXTRACTION W/PHACO Right 04/16/2016   Procedure: CATARACT EXTRACTION PHACO AND INTRAOCULAR LENS PLACEMENT ([facility]);  Surgeon: Estill Cotta, MD;  Location: [facility] ORS;  Service: Ophthalmology;  Laterality: Right;  [place] 01:17 AP% 22.5 CDE 35.08 Fluid pack lot # [id number]   CESAREAN SECTION     COLONOSCOPY WITH PROPOFOL N/A 08/12/2018   Procedure: COLONOSCOPY WITH PROPOFOL;  Surgeon: Toledo, Benay Pike, MD;  Location: [facility] ENDOSCOPY;  Service: Gastroenterology;  Laterality: N/A;   ELBOW SURGERY     Tendonitis   ESOPHAGOGASTRODUODENOSCOPY (EGD) WITH PROPOFOL N/A 08/12/2018   Procedure: ESOPHAGOGASTRODUODENOSCOPY (EGD) WITH PROPOFOL;  Surgeon: Toledo, Benay Pike, MD;  Location: [facility] ENDOSCOPY;  Service: Gastroenterology;  Laterality: N/A;   INCONTINENCE SURGERY     LOWER EXTREMITY ANGIOGRAPHY Left 06/23/2021   Procedure: Lower Extremity Angiography;  Surgeon: Katha Cabal, MD;  Location: [place] [facility];  Service: Cardiovascular;  Laterality: Left;   TONSILLECTOMY      Social History Social History   Tobacco Use   Smoking status: Every Day    Packs/day: 1.00    Types: Cigarettes   Smokeless tobacco: Never  Substance Use Topics   Alcohol use: Yes    Alcohol/week: 7.0 standard drinks    Types: 7 Cans of beer per week   Drug use: No    Family History Family  History  Problem Relation Age of Onset   CAD Mother    Hypertension Mother    Arthritis Mother    Diabetes Father    CAD Father     Allergies  Allergen Reactions   Lisinopril Other (See Comments)    Tongue burning sensation     REVIEW OF SYSTEMS (Negative unless checked)  Constitutional: [] Weight loss  [] Fever  [] Chills Cardiac: [] Chest pain   [] Chest pressure   [] Palpitations   [] Shortness of breath when laying flat   [] Shortness of breath with exertion. Vascular:  [x] Pain in legs with walking   [] Pain in legs at rest  [] History of DVT   [] Phlebitis   [] Swelling in legs   [] Varicose veins   [] Non-healing ulcers Pulmonary:   [] Uses home oxygen   [] Productive cough   [] Hemoptysis   [] Wheeze  [] COPD   [] Asthma Neurologic:  [] Dizziness   [] Seizures   [] History of stroke   [] History of TIA  [] Aphasia   [] Vissual changes   [] Weakness or numbness in arm   [] Weakness or numbness in leg Musculoskeletal:   [] Joint swelling   [] Joint pain   [] Low back pain Hematologic:  [] Easy bruising  [] Easy bleeding   [] Hypercoagulable state   [] Anemic Gastrointestinal:  [] Diarrhea   [] Vomiting  [] Gastroesophageal reflux/heartburn   [] Difficulty swallowing. Genitourinary:  [] Chronic kidney disease   [] Difficult urination  [] Frequent urination   [] Blood in urine Skin:  [] Rashes   [] Ulcers  Psychological:  [] History of anxiety   []  History of major depression.  Physical Examination  There were no vitals filed for this visit. There is no height or weight on file to calculate BMI. Gen: WD/WN, NAD Head: [place]/AT, No temporalis wasting.  Ear/Nose/Throat: Hearing grossly intact, nares w/o erythema or drainage Eyes: PER, EOMI, sclera nonicteric.  Neck: Supple, no masses.  No bruit or JVD.  Pulmonary:  Good air movement, no audible wheezing, no use of accessory muscles.  Cardiac: RRR, normal S1, S2, no Murmurs. Vascular:   Vessel Right Left  Radial Palpable Palpable  PT Not Palpable Not Palpable  DP Not  Palpable Not Palpable  Gastrointestinal: soft, non-distended. No guarding/no peritoneal signs.  Musculoskeletal: M/S 5/5 throughout.  No visible deformity.  Neurologic: CN 2-12 intact. Pain and light touch intact in extremities.  Symmetrical.  Speech is fluent. Motor exam as listed above. Psychiatric: Judgment intact, Mood & affect appropriate for pt's clinical situation. Dermatologic: No rashes or ulcers noted.  No changes consistent with cellulitis.   CBC Lab Results  Component Value Date   WBC 7.7 06/28/2021   HGB 9.5 (L) 06/28/2021   HCT 28.0 (L) 06/28/2021   MCV 88.9 06/28/2021   PLT 229 06/28/2021    BMET    Component Value Date/Time   NA 138 06/28/2021 0545   K 3.6 06/28/2021 0545   CL 105 06/28/2021 0545   CO2 25 06/28/2021 0545   GLUCOSE 120 (H) 06/28/2021 0545   BUN 8 06/28/2021  0545   CREATININE 0.69 06/28/2021 0545   CALCIUM 8.4 (L) 06/28/2021 0545   GFRNONAA >60 06/28/2021 0545   GFRAA >60 11/08/2015 0419   CrCl cannot be calculated (Patient's most recent lab result is older than the maximum 21 days allowed.).  COAG Lab Results  Component Value Date   INR 1.0 06/22/2021   INR 0.89 11/07/2015    Radiology VAS [place] ABI WITH/WO TBI  Result Date: 07/20/2021  LOWER EXTREMITY DOPPLER STUDY Patient Name:  LADAJA YUSUPOV  Date of Exam:   07/17/2021 Medical Rec #: [id number]       Accession #:    [id number] Date of Birth: Jan 26, 1951        Patient Gender: F Patient Age:   89 years Exam Location:  [facility] Procedure:      VAS [place] ABI WITH/WO TBI Referring Phys: Hortencia Pilar --------------------------------------------------------------------------------   Performing Technologist: Almira Coaster RVS  Examination Guidelines: A complete evaluation includes at minimum, Doppler waveform signals and systolic blood pressure reading at the level of bilateral brachial, anterior tibial, and posterior tibial arteries, when vessel segments are accessible. Bilateral  testing is considered an integral part of a complete examination. Photoelectric Plethysmograph (PPG) waveforms and toe systolic pressure readings are included as required and additional duplex testing as needed. Limited examinations for reoccurring indications may be performed as noted.  ABI Findings: +---------+------------------+-----+--------+--------+ Right    Rt Pressure (mmHg)IndexWaveformComment  +---------+------------------+-----+--------+--------+ Brachial 131                                     +---------+------------------+-----+--------+--------+ ATA      133                    biphasic.92      +---------+------------------+-----+--------+--------+ PTA      133               0.92 biphasic         +---------+------------------+-----+--------+--------+ Great Toe77                0.53 Abnormal         +---------+------------------+-----+--------+--------+ +---------+------------------+-----+----------+-------+ Left     Lt Pressure (mmHg)IndexWaveform  Comment +---------+------------------+-----+----------+-------+ Brachial 145                                      +---------+------------------+-----+----------+-------+ ATA      94                     monophasic.65     +---------+------------------+-----+----------+-------+ PTA      102               0.70 monophasic        +---------+------------------+-----+----------+-------+ Great Toe58                0.40 Abnormal          +---------+------------------+-----+----------+-------+ +-------+-----------+-----------+------------+------------+ ABI/TBIToday's ABIToday's TBIPrevious ABIPrevious TBI +-------+-----------+-----------+------------+------------+ Right  .92        .53                                 +-------+-----------+-----------+------------+------------+ Left   .70        .40                                 +-------+-----------+-----------+------------+------------+  Summary: Right: Resting right ankle-brachial index indicates mild right lower extremity arterial disease. The right toe-brachial index is abnormal. Left: Resting left ankle-brachial index indicates moderate left lower extremity arterial disease. The left toe-brachial index is abnormal.  *See table(s) above for measurements and observations.  Electronically signed by Hortencia Pilar MD on 07/20/2021 at 7:05:41 PM.    Final    VAS [place] LE ART SEG MULTI (Segm&LE Reynauds)  Result Date: 07/27/2021  LOWER EXTREMITY DOPPLER STUDY Patient Name:  IFEOLUWA BARTZ  Date of Exam:   07/27/2021 Medical Rec #: [id number]       Accession #:    [id number] Date of Birth: 10/22/1951        Patient Gender: F Patient Age:   57 years Exam Location:  [facility] Procedure:      VAS [place] LOWER EXT ART SEG MULTI (SEGMENTALS & LE RAYNAUDS) Referring Phys: Hortencia Pilar --------------------------------------------------------------------------------  Indications: Peripheral artery disease.  Vascular               06/23/21: SMA, infrarenal aorta & bilateral CIA Interventions:         PTA/stents;. Performing Technologist: Blondell Reveal RT, RDMS, RVT  Examination Guidelines: A complete evaluation includes at minimum, Doppler waveform signals and systolic blood pressure reading at the level of bilateral brachial, anterior tibial, and posterior tibial arteries, when vessel segments are accessible. Bilateral testing is considered an integral part of a complete examination. Photoelectric Plethysmograph (PPG) waveforms and toe systolic pressure readings are included as required and additional duplex testing as needed. Limited examinations for reoccurring indications may be performed as noted.  ABI Findings: +---------+------------------+-----+----------+--------+ Right    Rt Pressure (mmHg)IndexWaveform  Comment  +---------+------------------+-----+----------+--------+ Brachial 100                                        +---------+------------------+-----+----------+--------+ CFA                             biphasic           +---------+------------------+-----+----------+--------+ Popliteal                       biphasic           +---------+------------------+-----+----------+--------+ ATA      107               0.90 monophasic         +---------+------------------+-----+----------+--------+ PTA      117               0.98 monophasic         +---------+------------------+-----+----------+--------+ Great Toe51                0.43 Normal             +---------+------------------+-----+----------+--------+ +---------+------------------+-----+----------------+--------------------------+ Left     Lt Pressure (mmHg)IndexWaveform        Comment                    +---------+------------------+-----+----------------+--------------------------+ Brachial 119                                                               +---------+------------------+-----+----------------+--------------------------+  CFA                             strong          turbulent flow with plaque                                 monophasic                                 +---------+------------------+-----+----------------+--------------------------+ Popliteal                       monophasic                                 +---------+------------------+-----+----------------+--------------------------+ ATA      56                0.47 monophasic                                 +---------+------------------+-----+----------------+--------------------------+ PTA      61                0.51 monophasic                                 +---------+------------------+-----+----------------+--------------------------+ Great Toe29                0.24 Abnormal                                   +---------+------------------+-----+----------------+--------------------------+  +-------+-----------+-----------+------------+------------+ ABI/TBIToday's ABIToday's TBIPrevious ABIPrevious TBI +-------+-----------+-----------+------------+------------+ Right  0.98       0.43       0.92        0.53         +-------+-----------+-----------+------------+------------+ Left   0.51       0.24       0.70        0.40         +-------+-----------+-----------+------------+------------+ Left ABIs and TBIs appear decreased compared to prior study on 07/17/21. Right ABIs and TBIs appear essentially unchanged.  Summary: Right: Resting right ankle-brachial index is within normal range. No evidence of significant right lower extremity arterial disease. The right toe-brachial index is abnormal. Left: Resting left ankle-brachial index indicates moderate left lower extremity arterial disease. The left toe-brachial index is abnormal.  *See table(s) above for measurements and observations.  Electronically signed by Hortencia Pilar MD on 07/27/2021 at 4:58:58 PM.    Final      Assessment/Plan 1. Atherosclerosis of native artery of both lower extremities with intermittent claudication (HCC) Recommend:   The patient is status post successful angiogram with intervention.  The patient reports that the claudication symptoms and leg pain is essentially gone.   The patient denies lifestyle limiting changes at this point in time.   No further invasive studies, angiography or surgery at this time The patient should continue walking and begin a more formal exercise program.  The patient should continue antiplatelet therapy and aggressive treatment of the lipid abnormalities   Smoking cessation was again  discussed   The patient should continue wearing graduated compression socks 10-15 mmHg strength to control the mild edema.   Patient should undergo noninvasive studies as ordered. The patient will follow up with me after the studies.     - VAS [place] ABI WITH/WO TBI; Future  2. Mesenteric  artery stenosis (HCC) Recommend:   The patient is status post successful angiogram with intervention of the mesenteric vessels.  SMA stent  was performed.  The patient reports that the abdominal pain is improved and the post prandial symptoms are essentially gone.   The patient denies lifestyle limiting changes at this point in time.   No further invasive studies, angiography or surgery at this time The patient should continue walking and begin a more formal exercise program.  The patient should continue antiplatelet therapy and aggressive treatment of the lipid abnormalities   Smoking cessation was again discussed   Patient should undergo noninvasive studies as ordered. The patient will follow up with me after the studies.     - VAS [place] MESENTERIC; Future  3. Bilateral carotid artery stenosis Recommend:   Given the patient's asymptomatic subcritical stenosis no further invasive testing or surgery at this time.   Continue antiplatelet therapy as prescribed Continue management of CAD, HTN and Hyperlipidemia Healthy heart diet,  encouraged exercise at least 4 times per week Follow up in 6 months with duplex ultrasound and physical exam.    - VAS US CAROTID; Future  4. Type 2 diabetes mellitus with hyperglycemia, unspecified whether long term insulin use (HCC) Continue hypoglycemic medications as already ordered, these medications have been reviewed and there are no changes at this time.  Hgb A1C to be monitored as already arranged by primary service   5. Mixed hyperlipidemia Continue statin as ordered and reviewed, no changes at this time     Hortencia Pilar, MD  08/09/2021 2:41 PM

## 2021-08-10 ENCOUNTER — Ambulatory Visit (INDEPENDENT_AMBULATORY_CARE_PROVIDER_SITE_OTHER): Payer: Medicare HMO | Admitting: Vascular Surgery

## 2021-08-10 ENCOUNTER — Other Ambulatory Visit: Payer: Self-pay

## 2021-08-10 VITALS — BP 151/79 | HR 103 | Ht 60.0 in | Wt 141.0 lb

## 2021-08-10 DIAGNOSIS — I6523 Occlusion and stenosis of bilateral carotid arteries: Secondary | ICD-10-CM

## 2021-08-10 DIAGNOSIS — E1165 Type 2 diabetes mellitus with hyperglycemia: Secondary | ICD-10-CM

## 2021-08-10 DIAGNOSIS — E782 Mixed hyperlipidemia: Secondary | ICD-10-CM | POA: Diagnosis not present

## 2021-08-10 DIAGNOSIS — K219 Gastro-esophageal reflux disease without esophagitis: Secondary | ICD-10-CM | POA: Insufficient documentation

## 2021-08-10 DIAGNOSIS — K551 Chronic vascular disorders of intestine: Secondary | ICD-10-CM | POA: Diagnosis not present

## 2021-08-10 DIAGNOSIS — I70213 Atherosclerosis of native arteries of extremities with intermittent claudication, bilateral legs: Secondary | ICD-10-CM | POA: Diagnosis not present

## 2021-08-14 DIAGNOSIS — R011 Cardiac murmur, unspecified: Secondary | ICD-10-CM | POA: Diagnosis not present

## 2021-08-14 DIAGNOSIS — I739 Peripheral vascular disease, unspecified: Secondary | ICD-10-CM | POA: Diagnosis not present

## 2021-08-14 DIAGNOSIS — K219 Gastro-esophageal reflux disease without esophagitis: Secondary | ICD-10-CM | POA: Diagnosis not present

## 2021-08-14 DIAGNOSIS — F419 Anxiety disorder, unspecified: Secondary | ICD-10-CM | POA: Diagnosis not present

## 2021-08-14 DIAGNOSIS — G4733 Obstructive sleep apnea (adult) (pediatric): Secondary | ICD-10-CM | POA: Diagnosis not present

## 2021-08-14 DIAGNOSIS — E1142 Type 2 diabetes mellitus with diabetic polyneuropathy: Secondary | ICD-10-CM | POA: Diagnosis not present

## 2021-08-14 DIAGNOSIS — E782 Mixed hyperlipidemia: Secondary | ICD-10-CM | POA: Diagnosis not present

## 2021-08-14 DIAGNOSIS — I69351 Hemiplegia and hemiparesis following cerebral infarction affecting right dominant side: Secondary | ICD-10-CM | POA: Diagnosis not present

## 2021-08-14 DIAGNOSIS — I2510 Atherosclerotic heart disease of native coronary artery without angina pectoris: Secondary | ICD-10-CM | POA: Diagnosis not present

## 2021-08-16 DIAGNOSIS — M25562 Pain in left knee: Secondary | ICD-10-CM | POA: Diagnosis not present

## 2021-08-16 DIAGNOSIS — E782 Mixed hyperlipidemia: Secondary | ICD-10-CM | POA: Diagnosis not present

## 2021-08-16 DIAGNOSIS — E1142 Type 2 diabetes mellitus with diabetic polyneuropathy: Secondary | ICD-10-CM | POA: Diagnosis not present

## 2021-08-16 DIAGNOSIS — F4321 Adjustment disorder with depressed mood: Secondary | ICD-10-CM | POA: Diagnosis not present

## 2021-08-16 DIAGNOSIS — Z8673 Personal history of transient ischemic attack (TIA), and cerebral infarction without residual deficits: Secondary | ICD-10-CM | POA: Diagnosis not present

## 2021-08-16 DIAGNOSIS — M25561 Pain in right knee: Secondary | ICD-10-CM | POA: Diagnosis not present

## 2021-08-22 ENCOUNTER — Encounter (INDEPENDENT_AMBULATORY_CARE_PROVIDER_SITE_OTHER): Payer: Self-pay | Admitting: Vascular Surgery

## 2021-08-28 DIAGNOSIS — R011 Cardiac murmur, unspecified: Secondary | ICD-10-CM | POA: Diagnosis not present

## 2021-08-28 DIAGNOSIS — I2510 Atherosclerotic heart disease of native coronary artery without angina pectoris: Secondary | ICD-10-CM | POA: Diagnosis not present

## 2021-08-28 DIAGNOSIS — I739 Peripheral vascular disease, unspecified: Secondary | ICD-10-CM | POA: Diagnosis not present

## 2021-09-01 DIAGNOSIS — L821 Other seborrheic keratosis: Secondary | ICD-10-CM | POA: Diagnosis not present

## 2021-09-01 DIAGNOSIS — L853 Xerosis cutis: Secondary | ICD-10-CM | POA: Diagnosis not present

## 2021-09-01 DIAGNOSIS — L570 Actinic keratosis: Secondary | ICD-10-CM | POA: Diagnosis not present

## 2021-09-01 DIAGNOSIS — X32XXXA Exposure to sunlight, initial encounter: Secondary | ICD-10-CM | POA: Diagnosis not present

## 2021-09-06 DIAGNOSIS — I2510 Atherosclerotic heart disease of native coronary artery without angina pectoris: Secondary | ICD-10-CM | POA: Diagnosis not present

## 2021-09-06 DIAGNOSIS — R609 Edema, unspecified: Secondary | ICD-10-CM | POA: Diagnosis not present

## 2021-09-06 DIAGNOSIS — R42 Dizziness and giddiness: Secondary | ICD-10-CM | POA: Diagnosis not present

## 2021-09-06 DIAGNOSIS — E782 Mixed hyperlipidemia: Secondary | ICD-10-CM | POA: Diagnosis not present

## 2021-09-06 DIAGNOSIS — I10 Essential (primary) hypertension: Secondary | ICD-10-CM | POA: Diagnosis not present

## 2021-09-06 DIAGNOSIS — I639 Cerebral infarction, unspecified: Secondary | ICD-10-CM | POA: Diagnosis not present

## 2021-09-06 DIAGNOSIS — R0609 Other forms of dyspnea: Secondary | ICD-10-CM | POA: Diagnosis not present

## 2021-09-06 DIAGNOSIS — I480 Paroxysmal atrial fibrillation: Secondary | ICD-10-CM | POA: Diagnosis not present

## 2021-09-06 DIAGNOSIS — E119 Type 2 diabetes mellitus without complications: Secondary | ICD-10-CM | POA: Diagnosis not present

## 2021-09-21 DIAGNOSIS — M545 Low back pain, unspecified: Secondary | ICD-10-CM | POA: Diagnosis not present

## 2021-09-21 DIAGNOSIS — M25551 Pain in right hip: Secondary | ICD-10-CM | POA: Diagnosis not present

## 2021-09-21 DIAGNOSIS — G8929 Other chronic pain: Secondary | ICD-10-CM | POA: Diagnosis not present

## 2021-10-31 DIAGNOSIS — Z79899 Other long term (current) drug therapy: Secondary | ICD-10-CM | POA: Diagnosis not present

## 2021-10-31 DIAGNOSIS — E782 Mixed hyperlipidemia: Secondary | ICD-10-CM | POA: Diagnosis not present

## 2021-10-31 DIAGNOSIS — E1142 Type 2 diabetes mellitus with diabetic polyneuropathy: Secondary | ICD-10-CM | POA: Diagnosis not present

## 2021-10-31 DIAGNOSIS — F4321 Adjustment disorder with depressed mood: Secondary | ICD-10-CM | POA: Diagnosis not present

## 2021-11-13 ENCOUNTER — Other Ambulatory Visit: Payer: Self-pay | Admitting: Family Medicine

## 2021-11-13 DIAGNOSIS — R1032 Left lower quadrant pain: Secondary | ICD-10-CM

## 2021-11-23 ENCOUNTER — Telehealth (INDEPENDENT_AMBULATORY_CARE_PROVIDER_SITE_OTHER): Payer: Self-pay

## 2021-11-23 NOTE — Telephone Encounter (Signed)
Pt's sister called and wanted to make our office aware that the pt is having Abd pain and he PCP has ordered a CT scan.

## 2021-11-24 NOTE — Telephone Encounter (Signed)
I spoke to Dr. Delana Meyer an he wanted me to make the Autumn Arnold;s sister aware to call our office the day she has the scan so we can look up the results. I called the Autumn Arnold's sister and made her aware.

## 2021-11-30 ENCOUNTER — Ambulatory Visit
Admission: RE | Admit: 2021-11-30 | Discharge: 2021-11-30 | Disposition: A | Discharge status: Home / Self Care | Payer: Medicare Other | Source: Ambulatory Visit | Attending: Family Medicine | Admitting: Family Medicine

## 2021-11-30 DIAGNOSIS — R1032 Left lower quadrant pain: Secondary | ICD-10-CM | POA: Diagnosis present

## 2021-11-30 MED ORDER — IOHEXOL 300 MG/ML  SOLN
100.0000 mL | Freq: Once | INTRAMUSCULAR | Status: AC | PRN
  Administered 2021-11-30: 100 mL via INTRAVENOUS

## 2022-01-26 ENCOUNTER — Other Ambulatory Visit (INDEPENDENT_AMBULATORY_CARE_PROVIDER_SITE_OTHER): Payer: Self-pay | Admitting: Vascular Surgery

## 2022-01-26 DIAGNOSIS — I739 Peripheral vascular disease, unspecified: Secondary | ICD-10-CM

## 2022-01-26 DIAGNOSIS — I6523 Occlusion and stenosis of bilateral carotid arteries: Secondary | ICD-10-CM

## 2022-01-26 DIAGNOSIS — K551 Chronic vascular disorders of intestine: Secondary | ICD-10-CM

## 2022-01-29 ENCOUNTER — Encounter (INDEPENDENT_AMBULATORY_CARE_PROVIDER_SITE_OTHER): Payer: Medicare HMO

## 2022-01-29 ENCOUNTER — Ambulatory Visit (INDEPENDENT_AMBULATORY_CARE_PROVIDER_SITE_OTHER): Payer: Medicare HMO | Admitting: Nurse Practitioner

## 2022-02-07 DIAGNOSIS — E1142 Type 2 diabetes mellitus with diabetic polyneuropathy: Secondary | ICD-10-CM | POA: Diagnosis not present

## 2022-02-07 DIAGNOSIS — E782 Mixed hyperlipidemia: Secondary | ICD-10-CM | POA: Diagnosis not present

## 2022-02-14 DIAGNOSIS — E782 Mixed hyperlipidemia: Secondary | ICD-10-CM | POA: Diagnosis not present

## 2022-02-14 DIAGNOSIS — Z1389 Encounter for screening for other disorder: Secondary | ICD-10-CM | POA: Diagnosis not present

## 2022-02-14 DIAGNOSIS — Z0000 Encounter for general adult medical examination without abnormal findings: Secondary | ICD-10-CM | POA: Diagnosis not present

## 2022-02-14 DIAGNOSIS — E1142 Type 2 diabetes mellitus with diabetic polyneuropathy: Secondary | ICD-10-CM | POA: Diagnosis not present

## 2022-02-14 DIAGNOSIS — F419 Anxiety disorder, unspecified: Secondary | ICD-10-CM | POA: Diagnosis not present

## 2022-02-14 DIAGNOSIS — R030 Elevated blood-pressure reading, without diagnosis of hypertension: Secondary | ICD-10-CM | POA: Diagnosis not present

## 2022-02-14 DIAGNOSIS — F4321 Adjustment disorder with depressed mood: Secondary | ICD-10-CM | POA: Diagnosis not present

## 2022-02-14 DIAGNOSIS — I69351 Hemiplegia and hemiparesis following cerebral infarction affecting right dominant side: Secondary | ICD-10-CM | POA: Diagnosis not present

## 2022-02-14 DIAGNOSIS — F17210 Nicotine dependence, cigarettes, uncomplicated: Secondary | ICD-10-CM | POA: Diagnosis not present

## 2022-02-26 ENCOUNTER — Emergency Department: Payer: Medicare HMO

## 2022-02-26 ENCOUNTER — Emergency Department
Admission: EM | Admit: 2022-02-26 | Discharge: 2022-02-26 | Disposition: A | Discharge status: Home / Self Care | Payer: Medicare HMO | Attending: Emergency Medicine | Admitting: Emergency Medicine

## 2022-02-26 DIAGNOSIS — J449 Chronic obstructive pulmonary disease, unspecified: Secondary | ICD-10-CM | POA: Insufficient documentation

## 2022-02-26 DIAGNOSIS — M25552 Pain in left hip: Secondary | ICD-10-CM | POA: Diagnosis not present

## 2022-02-26 DIAGNOSIS — S0990XA Unspecified injury of head, initial encounter: Secondary | ICD-10-CM | POA: Diagnosis not present

## 2022-02-26 DIAGNOSIS — I10 Essential (primary) hypertension: Secondary | ICD-10-CM | POA: Insufficient documentation

## 2022-02-26 DIAGNOSIS — J45909 Unspecified asthma, uncomplicated: Secondary | ICD-10-CM | POA: Insufficient documentation

## 2022-02-26 DIAGNOSIS — W1839XA Other fall on same level, initial encounter: Secondary | ICD-10-CM | POA: Diagnosis not present

## 2022-02-26 DIAGNOSIS — W19XXXA Unspecified fall, initial encounter: Secondary | ICD-10-CM

## 2022-02-26 DIAGNOSIS — S3993XA Unspecified injury of pelvis, initial encounter: Secondary | ICD-10-CM | POA: Diagnosis not present

## 2022-02-26 DIAGNOSIS — R4182 Altered mental status, unspecified: Secondary | ICD-10-CM | POA: Insufficient documentation

## 2022-02-26 DIAGNOSIS — R109 Unspecified abdominal pain: Secondary | ICD-10-CM | POA: Insufficient documentation

## 2022-02-26 DIAGNOSIS — R42 Dizziness and giddiness: Secondary | ICD-10-CM | POA: Diagnosis not present

## 2022-02-26 DIAGNOSIS — E119 Type 2 diabetes mellitus without complications: Secondary | ICD-10-CM | POA: Insufficient documentation

## 2022-02-26 DIAGNOSIS — I700 Atherosclerosis of aorta: Secondary | ICD-10-CM | POA: Diagnosis not present

## 2022-02-26 DIAGNOSIS — S7002XA Contusion of left hip, initial encounter: Secondary | ICD-10-CM | POA: Insufficient documentation

## 2022-02-26 DIAGNOSIS — Z90710 Acquired absence of both cervix and uterus: Secondary | ICD-10-CM | POA: Diagnosis not present

## 2022-02-26 DIAGNOSIS — S79919A Unspecified injury of unspecified hip, initial encounter: Secondary | ICD-10-CM | POA: Diagnosis not present

## 2022-02-26 DIAGNOSIS — R52 Pain, unspecified: Secondary | ICD-10-CM | POA: Diagnosis not present

## 2022-02-26 DIAGNOSIS — I959 Hypotension, unspecified: Secondary | ICD-10-CM | POA: Diagnosis not present

## 2022-02-26 DIAGNOSIS — Z043 Encounter for examination and observation following other accident: Secondary | ICD-10-CM | POA: Diagnosis not present

## 2022-02-26 DIAGNOSIS — M47812 Spondylosis without myelopathy or radiculopathy, cervical region: Secondary | ICD-10-CM | POA: Diagnosis not present

## 2022-02-26 LAB — COMPREHENSIVE METABOLIC PANEL
ALT: 15 U/L (ref 0–44)
AST: 19 U/L (ref 15–41)
Albumin: 3.8 g/dL (ref 3.5–5.0)
Alkaline Phosphatase: 67 U/L (ref 38–126)
Anion gap: 8 (ref 5–15)
BUN: 15 mg/dL (ref 8–23)
CO2: 22 mmol/L (ref 22–32)
Calcium: 8.6 mg/dL — ABNORMAL LOW (ref 8.9–10.3)
Chloride: 106 mmol/L (ref 98–111)
Creatinine, Ser: 1.03 mg/dL — ABNORMAL HIGH (ref 0.44–1.00)
GFR, Estimated: 59 mL/min — ABNORMAL LOW (ref >60)
Glucose, Bld: 220 mg/dL — ABNORMAL HIGH (ref 70–99)
Potassium: 3.9 mmol/L (ref 3.5–5.1)
Sodium: 136 mmol/L (ref 135–145)
Total Bilirubin: 0.5 mg/dL (ref 0.3–1.2)
Total Protein: 7 g/dL (ref 6.5–8.1)

## 2022-02-26 LAB — CBC WITH DIFFERENTIAL/PLATELET
Abs Immature Granulocytes: 0.06 10*3/uL (ref 0.00–0.07)
Basophils Absolute: 0 10*3/uL (ref 0.0–0.1)
Basophils Relative: 0 %
Eosinophils Absolute: 0 10*3/uL (ref 0.0–0.5)
Eosinophils Relative: 0 %
HCT: 36.4 % (ref 36.0–46.0)
Hemoglobin: 11.5 g/dL — ABNORMAL LOW (ref 12.0–15.0)
Immature Granulocytes: 1 %
Lymphocytes Relative: 9 %
Lymphs Abs: 1.1 10*3/uL (ref 0.7–4.0)
MCH: 28.3 pg (ref 26.0–34.0)
MCHC: 31.6 g/dL (ref 30.0–36.0)
MCV: 89.7 fL (ref 80.0–100.0)
Monocytes Absolute: 0.6 10*3/uL (ref 0.1–1.0)
Monocytes Relative: 5 %
Neutro Abs: 9.5 10*3/uL — ABNORMAL HIGH (ref 1.7–7.7)
Neutrophils Relative %: 85 %
Platelets: 205 10*3/uL (ref 150–400)
RBC: 4.06 MIL/uL (ref 3.87–5.11)
RDW: 15.7 % — ABNORMAL HIGH (ref 11.5–15.5)
WBC: 11.2 10*3/uL — ABNORMAL HIGH (ref 4.0–10.5)
nRBC: 0 % (ref 0.0–0.2)

## 2022-02-26 LAB — ETHANOL: Alcohol, Ethyl (B): <10 mg/dL (ref <10)

## 2022-02-26 NOTE — Discharge Instructions (Signed)
Your CAT scans and x-rays did not show any traumatic injuries from your fall.  I think that the CBD gummy made you somewhat confused and less steady.  Please avoid using the CBD Gummies in the future. ?

## 2022-02-26 NOTE — ED Provider Notes (Signed)
Patient signed out to me pending MTF.  Patient had a fall and then took a CBD gummy.  Presenting with altered mental status.  Her traumatic work-up has been nonrevealing labs are also reassuring.  Patient still not able to ambulate likely due to intoxication. ? ?7:44 AM ?I assessed the patient.  She is resting comfortably alert and oriented x2, thinks it is 2022.  No complaints at this time.  No longer slurring her speech.  We will try to ambulate soon as patient recently failed ambulation trial. ? ?8:57 AM ?I got the patient out of bed and she was able to ambulate with assistance.  Typically uses a walker.  I think she is appropriate for discharge at this point.  Lives with her husband and has her daughter who can help her home. ?  ?Rada Hay, MD ?02/26/22 [phone] ? ?

## 2022-02-26 NOTE — ED Provider Notes (Signed)
? ?[facility] ?Provider Note ? ? ? Event Date/Time  ? First MD Initiated Contact with Patient 02/26/22 0150   ?  (approximate) ? ? ?History  ? ?Fall (/) and Ingestion ? ? ?HPI ? ?Autumn Arnold is a 71 y.o. female with a history of hypertension, hyperlipidemia, diabetes, COPD, arthritis, anxiety who presents for evaluation of left hip pain.  Patient reports that she had a mechanical fall earlier today and fell onto her left hip.  She took 1 delta 8 THC gummy to help with the pain.  Upon arrival patient is intoxicated.  Does not really remember the fall.  There is no family here with her.  Her only complaint is left hip pain ? ?Level 5 caveat:  Portions of the history and physical were unable to be obtained due to ams ? ?  ? ? ?Past Medical History:  ?Diagnosis Date  ? Anxiety   ? Arthritis   ? Asthma   ? Bilateral lower extremity edema   ? COPD (chronic obstructive pulmonary disease) ([place])   ? Cough   ? CHRONIC  ? Diabetes mellitus 2008  ? Edema   ? LEGS/FEET  ? GERD (gastroesophageal reflux disease)   ? Hyperlipidemia   ? Hypertension   ? Shortness of breath dyspnea   ? DOE  ? Sleep apnea   ? CPAP at [facility], Dr. Humphrey Rolls  ? Stroke [facility]) 02/2010  ? headache, left arm numbness  ? Wheezing   ? ? ?Past Surgical History:  ?Procedure Laterality Date  ? ABDOMINAL HYSTERECTOMY    ? ANTERIOR FUSION CERVICAL SPINE    ? CARPAL TUNNEL RELEASE    ? Bilateral  ? CATARACT EXTRACTION W/PHACO Left 03/12/2016  ? Procedure: CATARACT EXTRACTION PHACO AND INTRAOCULAR LENS PLACEMENT (IOC);  Surgeon: Estill Cotta, MD;  Location: [facility] ORS;  Service: Ophthalmology;  Laterality: Left;  [place] 01:12 ?AP% 23.4 ?CDE 29.83 ?fluid pack lot # [id number]  ? CATARACT EXTRACTION W/PHACO Right 04/16/2016  ? Procedure: CATARACT EXTRACTION PHACO AND INTRAOCULAR LENS PLACEMENT (IOC);  Surgeon: Estill Cotta, MD;  Location: [facility] ORS;  Service: Ophthalmology;  Laterality: Right;  [place] 01:17 ?AP% 22.5 ?CDE 35.08 ?Fluid pack lot # [id number]  ?  CESAREAN SECTION    ? COLONOSCOPY WITH PROPOFOL N/A 08/12/2018  ? Procedure: COLONOSCOPY WITH PROPOFOL;  Surgeon: Toledo, Benay Pike, MD;  Location: [facility] ENDOSCOPY;  Service: Gastroenterology;  Laterality: N/A;  ? ELBOW SURGERY    ? Tendonitis  ? ESOPHAGOGASTRODUODENOSCOPY (EGD) WITH PROPOFOL N/A 08/12/2018  ? Procedure: ESOPHAGOGASTRODUODENOSCOPY (EGD) WITH PROPOFOL;  Surgeon: Toledo, Benay Pike, MD;  Location: [facility] ENDOSCOPY;  Service: Gastroenterology;  Laterality: N/A;  ? INCONTINENCE SURGERY    ? LOWER EXTREMITY ANGIOGRAPHY Left 06/23/2021  ? Procedure: Lower Extremity Angiography;  Surgeon: Katha Cabal, MD;  Location: [facility];  Service: Cardiovascular;  Laterality: Left;  ? TONSILLECTOMY    ? ? ? ?Physical Exam  ? ?Triage Vital Signs: ?ED Triage Vitals  ?Enc Vitals Group  ?   BP 02/26/22 0154 127/62  ?   Pulse Rate 02/26/22 0154 94  ?   Resp 02/26/22 0154 18  ?   Temp 02/26/22 0154 97.6 ?F (36.4 ?C)  ?   Temp Source 02/26/22 0154 Oral  ?   SpO2 02/26/22 0154 95 %  ?   Weight --   ?   Height --   ?   Head Circumference --   ?   Peak Flow --   ?  Pain Score 02/26/22 0155 8  ?   Pain Loc --   ?   Pain Edu? --   ?   Excl. in [place]? --   ? ? ?Most recent vital signs: ?Vitals:  ? 02/26/22 0154  ?BP: 127/62  ?Pulse: 94  ?Resp: 18  ?Temp: 97.6 ?F (36.4 ?C)  ?SpO2: 95%  ? ? ?Full spinal precautions maintained throughout the trauma exam. ?Constitutional: Alert and oriented. No acute distress. Does appear intoxicated. ?HEENT ?Head: Normocephalic and atraumatic. ?Face: No facial bony tenderness. Stable midface ?Ears: No hemotympanum bilaterally. No Battle sign ?Eyes: No eye injury. PERRL. No raccoon eyes ?Nose: Nontender. No epistaxis. No rhinorrhea ?Mouth/Throat: Mucous membranes are moist. No oropharyngeal blood. No dental injury. Airway patent without stridor. Normal voice. ?Neck: no C-collar. No midline c-spine tenderness.  ?Cardiovascular: Normal rate, regular rhythm. Normal and symmetric distal pulses  are present in all extremities. ?Pulmonary/Chest: Chest wall is stable and nontender to palpation/compression. Normal respiratory effort. Breath sounds are normal. No crepitus.  ?Abdominal: Soft, nontender, non distended. ?Musculoskeletal: Nontender with normal full range of motion in all extremities. No deformities. No thoracic or lumbar midline spinal tenderness. Pelvis is stable. ?Skin: Skin is warm, dry and intact. No abrasions or contutions. ?Psychiatric: Speech and behavior are appropriate. ?Neurological: Normal speech and language. Moves all extremities to command. No gross focal neurologic deficits are appreciated. ? ?Glascow Coma Score: ?4 - Opens eyes on own ?6 - Follows simple motor commands ?4 - Seems confused, disoriented ?GCS: 14 ? ? ?ED Results / Procedures / Treatments  ? ?Labs ?(all labs ordered are listed, but only abnormal results are displayed) ?Labs Reviewed  ?CBC WITH DIFFERENTIAL/PLATELET - Abnormal; Notable for the following components:  ?    Result Value  ? WBC 11.2 (*)   ? Hemoglobin 11.5 (*)   ? RDW 15.7 (*)   ? Neutro Abs 9.5 (*)   ? All other components within normal limits  ?COMPREHENSIVE METABOLIC PANEL - Abnormal; Notable for the following components:  ? Glucose, Bld 220 (*)   ? Creatinine, Ser 1.03 (*)   ? Calcium 8.6 (*)   ? GFR, Estimated 59 (*)   ? All other components within normal limits  ?ETHANOL  ?URINE DRUG SCREEN, QUALITATIVE ([facility] ONLY)  ? ? ? ?EKG ? ?ED ECG REPORT ?I, Rudene Re, the attending physician, personally viewed and interpreted this ECG. ? ?Sinus rhythm with a rate of 92, normal intervals, normal axis, no ST elevations or depressions. ? ?RADIOLOGY ?I, Rudene Re, attending MD, have personally viewed and interpreted the images obtained during this visit as below: ? ?X-ray of the pelvis and hip is negative ? ?CT of the pelvis negative ? ?CT head and cervical spine negative ? ? ?___________________________________________________ ?Interpretation by  Radiologist:  ?CT Head Wo Contrast ? ?Result Date: 02/26/2022 ?CLINICAL DATA:  Fall EXAM: CT HEAD WITHOUT CONTRAST CT CERVICAL SPINE WITHOUT CONTRAST TECHNIQUE: Multidetector CT imaging of the head and cervical spine was performed following the standard protocol without intravenous contrast. Multiplanar CT image reconstructions of the cervical spine were also generated. RADIATION DOSE REDUCTION: This exam was performed according to the departmental dose-optimization program which includes automated exposure control, adjustment of the mA and/or kV according to patient size and/or use of iterative reconstruction technique. COMPARISON:  CT head dated 06/24/2021. FINDINGS: CT HEAD FINDINGS Brain: No evidence of acute infarction, hemorrhage, hydrocephalus, extra-axial collection or mass lesion/mass effect. Vascular: No hyperdense vessel or unexpected calcification. Skull: Normal. Negative for fracture or  focal lesion. Sinuses/Orbits: The visualized paranasal sinuses are essentially clear. The mastoid air cells are unopacified. Other: None. CT CERVICAL SPINE FINDINGS Alignment: Straightening of the cervical spine. Skull base and vertebrae: No acute fracture. No primary bone lesion or focal pathologic process. Soft tissues and spinal canal: No prevertebral fluid or swelling. No visible canal hematoma. Disc levels: C5-6 fusion. Mild degenerative changes of the mid/lower cervical spine. Spinal canal is patent. Upper chest: Visualized lung apices are clear. Other: Visualized thyroid is unremarkable. IMPRESSION: Normal head CT. No evidence of traumatic injury to the cervical spine. Postsurgical changes at C5-6. Degenerative changes of the mid/lower cervical spine. Electronically Signed   By: Julian Hy M.D.   On: 02/26/2022 02:55  ? ?CT Cervical Spine Wo Contrast ? ?Result Date: 02/26/2022 ?CLINICAL DATA:  Fall EXAM: CT HEAD WITHOUT CONTRAST CT CERVICAL SPINE WITHOUT CONTRAST TECHNIQUE: Multidetector CT imaging of the  head and cervical spine was performed following the standard protocol without intravenous contrast. Multiplanar CT image reconstructions of the cervical spine were also generated. RADIATION DOSE REDUCTION:

## 2022-02-26 NOTE — ED Triage Notes (Signed)
Pt states she fell earlier today and complains of left hip pain. Pt reports the fall was mechanical and denies any LOC or head injury. Pt took 1 delta 8 THC gummy to help with the pain. Pt is a poor historian given current mental state. ?

## 2022-02-26 NOTE — ED Notes (Signed)
Follow up pcp info provided , all questions answered, daughter here to aid in transportation  ?

## 2022-02-28 DIAGNOSIS — M25551 Pain in right hip: Secondary | ICD-10-CM | POA: Diagnosis not present

## 2022-02-28 DIAGNOSIS — M545 Low back pain, unspecified: Secondary | ICD-10-CM | POA: Diagnosis not present

## 2022-02-28 DIAGNOSIS — M25552 Pain in left hip: Secondary | ICD-10-CM | POA: Diagnosis not present

## 2022-02-28 DIAGNOSIS — W010XXA Fall on same level from slipping, tripping and stumbling without subsequent striking against object, initial encounter: Secondary | ICD-10-CM | POA: Diagnosis not present

## 2022-02-28 DIAGNOSIS — Y92009 Unspecified place in unspecified non-institutional (private) residence as the place of occurrence of the external cause: Secondary | ICD-10-CM | POA: Diagnosis not present

## 2022-03-05 ENCOUNTER — Telehealth (INDEPENDENT_AMBULATORY_CARE_PROVIDER_SITE_OTHER): Payer: Self-pay | Admitting: Vascular Surgery

## 2022-03-05 NOTE — Telephone Encounter (Signed)
I spoke with Dr. Delana Meyer regarding the patient being seen after her fall and hurting her leg and abdomen. Per Dr. Delana Meyer the patient needs to be evaluated at the ED for the fall. I attempted to contact the patient and the voice mail box is full so I was unable to leave a message. ?

## 2022-03-05 NOTE — Telephone Encounter (Signed)
Patient called wanting an appointment stating she had a really bad fall and her leg and lower stomach is hurting her bad.  I spoke to Dr. Delana Meyer and he states she should go see her PCP or go to the ER.  She states she went to her PCP and he told her to see her doctor here.  Please advise. ?

## 2022-03-15 DIAGNOSIS — M7062 Trochanteric bursitis, left hip: Secondary | ICD-10-CM | POA: Diagnosis not present

## 2022-03-19 DIAGNOSIS — W010XXA Fall on same level from slipping, tripping and stumbling without subsequent striking against object, initial encounter: Secondary | ICD-10-CM | POA: Diagnosis not present

## 2022-03-19 DIAGNOSIS — M7062 Trochanteric bursitis, left hip: Secondary | ICD-10-CM | POA: Diagnosis not present

## 2022-03-19 DIAGNOSIS — M76892 Other specified enthesopathies of left lower limb, excluding foot: Secondary | ICD-10-CM | POA: Diagnosis not present

## 2022-03-27 DIAGNOSIS — I739 Peripheral vascular disease, unspecified: Secondary | ICD-10-CM | POA: Diagnosis not present

## 2022-03-27 DIAGNOSIS — F17200 Nicotine dependence, unspecified, uncomplicated: Secondary | ICD-10-CM | POA: Diagnosis not present

## 2022-04-03 ENCOUNTER — Emergency Department: Payer: Medicare HMO

## 2022-04-03 DIAGNOSIS — G4733 Obstructive sleep apnea (adult) (pediatric): Secondary | ICD-10-CM | POA: Diagnosis present

## 2022-04-03 DIAGNOSIS — I70201 Unspecified atherosclerosis of native arteries of extremities, right leg: Secondary | ICD-10-CM | POA: Diagnosis not present

## 2022-04-03 DIAGNOSIS — R918 Other nonspecific abnormal finding of lung field: Secondary | ICD-10-CM | POA: Diagnosis not present

## 2022-04-03 DIAGNOSIS — I70222 Atherosclerosis of native arteries of extremities with rest pain, left leg: Principal | ICD-10-CM | POA: Diagnosis present

## 2022-04-03 DIAGNOSIS — I959 Hypotension, unspecified: Secondary | ICD-10-CM | POA: Diagnosis present

## 2022-04-03 DIAGNOSIS — J449 Chronic obstructive pulmonary disease, unspecified: Secondary | ICD-10-CM | POA: Diagnosis present

## 2022-04-03 DIAGNOSIS — I745 Embolism and thrombosis of iliac artery: Secondary | ICD-10-CM | POA: Diagnosis present

## 2022-04-03 DIAGNOSIS — I739 Peripheral vascular disease, unspecified: Secondary | ICD-10-CM | POA: Diagnosis present

## 2022-04-03 DIAGNOSIS — T82858A Stenosis of vascular prosthetic devices, implants and grafts, initial encounter: Secondary | ICD-10-CM | POA: Diagnosis not present

## 2022-04-03 DIAGNOSIS — Z01810 Encounter for preprocedural cardiovascular examination: Secondary | ICD-10-CM | POA: Diagnosis not present

## 2022-04-03 DIAGNOSIS — D509 Iron deficiency anemia, unspecified: Secondary | ICD-10-CM | POA: Diagnosis present

## 2022-04-03 DIAGNOSIS — E1151 Type 2 diabetes mellitus with diabetic peripheral angiopathy without gangrene: Secondary | ICD-10-CM | POA: Diagnosis present

## 2022-04-03 DIAGNOSIS — Z8673 Personal history of transient ischemic attack (TIA), and cerebral infarction without residual deficits: Secondary | ICD-10-CM

## 2022-04-03 DIAGNOSIS — I63231 Cerebral infarction due to unspecified occlusion or stenosis of right carotid arteries: Secondary | ICD-10-CM | POA: Diagnosis present

## 2022-04-03 DIAGNOSIS — Z7982 Long term (current) use of aspirin: Secondary | ICD-10-CM

## 2022-04-03 DIAGNOSIS — E1169 Type 2 diabetes mellitus with other specified complication: Secondary | ICD-10-CM | POA: Diagnosis present

## 2022-04-03 DIAGNOSIS — Z888 Allergy status to other drugs, medicaments and biological substances status: Secondary | ICD-10-CM | POA: Diagnosis not present

## 2022-04-03 DIAGNOSIS — I708 Atherosclerosis of other arteries: Secondary | ICD-10-CM | POA: Diagnosis present

## 2022-04-03 DIAGNOSIS — E785 Hyperlipidemia, unspecified: Secondary | ICD-10-CM | POA: Diagnosis present

## 2022-04-03 DIAGNOSIS — Z833 Family history of diabetes mellitus: Secondary | ICD-10-CM

## 2022-04-03 DIAGNOSIS — I700 Atherosclerosis of aorta: Secondary | ICD-10-CM | POA: Diagnosis not present

## 2022-04-03 DIAGNOSIS — I6523 Occlusion and stenosis of bilateral carotid arteries: Secondary | ICD-10-CM | POA: Diagnosis not present

## 2022-04-03 DIAGNOSIS — I672 Cerebral atherosclerosis: Secondary | ICD-10-CM | POA: Diagnosis not present

## 2022-04-03 DIAGNOSIS — Z8249 Family history of ischemic heart disease and other diseases of the circulatory system: Secondary | ICD-10-CM | POA: Diagnosis not present

## 2022-04-03 DIAGNOSIS — Z79899 Other long term (current) drug therapy: Secondary | ICD-10-CM | POA: Diagnosis not present

## 2022-04-03 DIAGNOSIS — F32A Depression, unspecified: Secondary | ICD-10-CM | POA: Diagnosis present

## 2022-04-03 DIAGNOSIS — I2510 Atherosclerotic heart disease of native coronary artery without angina pectoris: Secondary | ICD-10-CM | POA: Diagnosis not present

## 2022-04-03 DIAGNOSIS — F17210 Nicotine dependence, cigarettes, uncomplicated: Secondary | ICD-10-CM | POA: Diagnosis present

## 2022-04-03 DIAGNOSIS — G319 Degenerative disease of nervous system, unspecified: Secondary | ICD-10-CM | POA: Diagnosis not present

## 2022-04-03 DIAGNOSIS — I63212 Cerebral infarction due to unspecified occlusion or stenosis of left vertebral arteries: Secondary | ICD-10-CM | POA: Diagnosis not present

## 2022-04-03 DIAGNOSIS — I10 Essential (primary) hypertension: Secondary | ICD-10-CM | POA: Diagnosis present

## 2022-04-03 DIAGNOSIS — Z7983 Long term (current) use of bisphosphonates: Secondary | ICD-10-CM | POA: Diagnosis not present

## 2022-04-03 DIAGNOSIS — I63233 Cerebral infarction due to unspecified occlusion or stenosis of bilateral carotid arteries: Secondary | ICD-10-CM | POA: Diagnosis not present

## 2022-04-03 DIAGNOSIS — I639 Cerebral infarction, unspecified: Secondary | ICD-10-CM | POA: Diagnosis not present

## 2022-04-03 DIAGNOSIS — Z981 Arthrodesis status: Secondary | ICD-10-CM

## 2022-04-03 DIAGNOSIS — I6389 Other cerebral infarction: Secondary | ICD-10-CM | POA: Diagnosis not present

## 2022-04-03 DIAGNOSIS — M47812 Spondylosis without myelopathy or radiculopathy, cervical region: Secondary | ICD-10-CM | POA: Diagnosis not present

## 2022-04-03 DIAGNOSIS — F17200 Nicotine dependence, unspecified, uncomplicated: Secondary | ICD-10-CM | POA: Diagnosis not present

## 2022-04-03 DIAGNOSIS — F419 Anxiety disorder, unspecified: Secondary | ICD-10-CM | POA: Diagnosis present

## 2022-04-03 DIAGNOSIS — I70211 Atherosclerosis of native arteries of extremities with intermittent claudication, right leg: Secondary | ICD-10-CM | POA: Diagnosis not present

## 2022-04-03 DIAGNOSIS — Z7984 Long term (current) use of oral hypoglycemic drugs: Secondary | ICD-10-CM

## 2022-04-03 DIAGNOSIS — G8194 Hemiplegia, unspecified affecting left nondominant side: Secondary | ICD-10-CM | POA: Diagnosis present

## 2022-04-03 DIAGNOSIS — D649 Anemia, unspecified: Secondary | ICD-10-CM | POA: Diagnosis not present

## 2022-04-03 DIAGNOSIS — M79605 Pain in left leg: Secondary | ICD-10-CM | POA: Diagnosis not present

## 2022-04-03 DIAGNOSIS — I771 Stricture of artery: Secondary | ICD-10-CM | POA: Diagnosis not present

## 2022-04-03 DIAGNOSIS — I6503 Occlusion and stenosis of bilateral vertebral arteries: Secondary | ICD-10-CM | POA: Diagnosis not present

## 2022-04-03 DIAGNOSIS — H538 Other visual disturbances: Secondary | ICD-10-CM | POA: Diagnosis not present

## 2022-04-03 DIAGNOSIS — R531 Weakness: Secondary | ICD-10-CM | POA: Diagnosis not present

## 2022-04-03 LAB — CBC WITH DIFFERENTIAL/PLATELET
Abs Immature Granulocytes: 0.05 10*3/uL (ref 0.00–0.07)
Basophils Absolute: 0.1 10*3/uL (ref 0.0–0.1)
Basophils Relative: 0 %
Eosinophils Absolute: 0.2 10*3/uL (ref 0.0–0.5)
Eosinophils Relative: 2 %
HCT: 35.5 % — ABNORMAL LOW (ref 36.0–46.0)
Hemoglobin: 11.2 g/dL — ABNORMAL LOW (ref 12.0–15.0)
Immature Granulocytes: 0 %
Lymphocytes Relative: 23 %
Lymphs Abs: 2.7 10*3/uL (ref 0.7–4.0)
MCH: 28.8 pg (ref 26.0–34.0)
MCHC: 31.5 g/dL (ref 30.0–36.0)
MCV: 91.3 fL (ref 80.0–100.0)
Monocytes Absolute: 0.8 10*3/uL (ref 0.1–1.0)
Monocytes Relative: 6 %
Neutro Abs: 8.1 10*3/uL — ABNORMAL HIGH (ref 1.7–7.7)
Neutrophils Relative %: 69 %
Platelets: 213 10*3/uL (ref 150–400)
RBC: 3.89 MIL/uL (ref 3.87–5.11)
RDW: 15.6 % — ABNORMAL HIGH (ref 11.5–15.5)
WBC: 11.8 10*3/uL — ABNORMAL HIGH (ref 4.0–10.5)
nRBC: 0 % (ref 0.0–0.2)

## 2022-04-03 LAB — COMPREHENSIVE METABOLIC PANEL
ALT: 12 U/L (ref 0–44)
AST: 15 U/L (ref 15–41)
Albumin: 3.7 g/dL (ref 3.5–5.0)
Alkaline Phosphatase: 89 U/L (ref 38–126)
Anion gap: 10 (ref 5–15)
BUN: 16 mg/dL (ref 8–23)
CO2: 24 mmol/L (ref 22–32)
Calcium: 9.6 mg/dL (ref 8.9–10.3)
Chloride: 102 mmol/L (ref 98–111)
Creatinine, Ser: 0.93 mg/dL (ref 0.44–1.00)
GFR, Estimated: >60 mL/min (ref >60)
Glucose, Bld: 116 mg/dL — ABNORMAL HIGH (ref 70–99)
Potassium: 3.9 mmol/L (ref 3.5–5.1)
Sodium: 136 mmol/L (ref 135–145)
Total Bilirubin: 0.4 mg/dL (ref 0.3–1.2)
Total Protein: 7.1 g/dL (ref 6.5–8.1)

## 2022-04-03 LAB — CBG MONITORING, ED: Glucose-Capillary: 123 mg/dL — ABNORMAL HIGH (ref 70–99)

## 2022-04-03 MED ORDER — SODIUM CHLORIDE 0.9 % IV BOLUS
1000.0000 mL | Freq: Once | INTRAVENOUS | Status: AC
  Administered 2022-04-04: 1000 mL via INTRAVENOUS

## 2022-04-03 NOTE — ED Triage Notes (Signed)
Pt arrives with c/o blurry vision and some difficulty finding words. Per pt, she has had these symptoms before with her previous stroke. Per pts husband, pt requested to come to the hospital because she did not feel right. PA in room with pt in triage. Pt denies facial droop, weakness, or headache.

## 2022-04-03 NOTE — ED Provider Triage Note (Signed)
  Emergency Medicine Provider Triage Evaluation Note  Autumn Arnold , a 72 y.o.female,  was evaluated in triage.  Pt complains of several symptoms.  Reports lower leg and hip pain following mechanical fall yesterday.  Additionally states that she has been experiencing blurred vision as well and was concerned that she may be having a stroke yesterday, though she is not experiencing any blurred vision now.  Patient states that she generally feels unwell.  Her husband states that she has not been acting herself for the past few days.   Review of Systems  Positive: Altered mental status, left leg/hip pain, blurred vision Negative: Denies fever, chest pain, vomiting  Physical Exam   Vitals:   04/03/22 1911 04/03/22 1923  BP: (!) 70/48 (!) 115/55  Pulse: (!) 111 75  Resp:  20  SpO2:  100%   Gen:   Awake, no distress   Resp:  Normal effort  MSK:   Moves extremities without difficulty  Other:  No facial droop.  No pronator drift.  No weakness or numbness in the upper or lower extremities  Medical Decision Making  Given the patient's initial medical screening exam, the following diagnostic evaluation has been ordered. The patient will be placed in the appropriate treatment space, once one is available, to complete the evaluation and treatment. I have discussed the plan of care with the patient and I have advised the patient that an ED physician or mid-level practitioner will reevaluate their condition after the test results have been received, as the results may give them additional insight into the type of treatment they may need.    Diagnostics: Labs, head CT, x-rays hip/lower leg, UA  Treatments: IV fluids.   Teodoro Spray, [place] 04/03/22 Kathyrn Drown

## 2022-04-04 ENCOUNTER — Inpatient Hospital Stay: Payer: Medicare HMO

## 2022-04-04 ENCOUNTER — Encounter (INDEPENDENT_AMBULATORY_CARE_PROVIDER_SITE_OTHER): Payer: Medicare HMO

## 2022-04-04 ENCOUNTER — Emergency Department: Payer: Medicare HMO

## 2022-04-04 ENCOUNTER — Ambulatory Visit (INDEPENDENT_AMBULATORY_CARE_PROVIDER_SITE_OTHER): Payer: Medicare HMO | Admitting: Nurse Practitioner

## 2022-04-04 ENCOUNTER — Inpatient Hospital Stay
Admission: EM | Admit: 2022-04-04 | Discharge: 2022-04-13 | DRG: 252 | Disposition: A | Discharge status: Home Health | Payer: Medicare HMO | Attending: Osteopathic Medicine | Admitting: Osteopathic Medicine

## 2022-04-04 ENCOUNTER — Other Ambulatory Visit: Payer: Self-pay

## 2022-04-04 DIAGNOSIS — I6523 Occlusion and stenosis of bilateral carotid arteries: Secondary | ICD-10-CM | POA: Diagnosis not present

## 2022-04-04 DIAGNOSIS — I708 Atherosclerosis of other arteries: Secondary | ICD-10-CM | POA: Diagnosis present

## 2022-04-04 DIAGNOSIS — F17200 Nicotine dependence, unspecified, uncomplicated: Secondary | ICD-10-CM | POA: Diagnosis present

## 2022-04-04 DIAGNOSIS — I63239 Cerebral infarction due to unspecified occlusion or stenosis of unspecified carotid arteries: Secondary | ICD-10-CM

## 2022-04-04 DIAGNOSIS — E785 Hyperlipidemia, unspecified: Secondary | ICD-10-CM

## 2022-04-04 DIAGNOSIS — F32A Depression, unspecified: Secondary | ICD-10-CM | POA: Diagnosis present

## 2022-04-04 DIAGNOSIS — D509 Iron deficiency anemia, unspecified: Secondary | ICD-10-CM | POA: Diagnosis present

## 2022-04-04 DIAGNOSIS — Z7982 Long term (current) use of aspirin: Secondary | ICD-10-CM | POA: Diagnosis not present

## 2022-04-04 DIAGNOSIS — I70211 Atherosclerosis of native arteries of extremities with intermittent claudication, right leg: Secondary | ICD-10-CM | POA: Diagnosis not present

## 2022-04-04 DIAGNOSIS — I739 Peripheral vascular disease, unspecified: Secondary | ICD-10-CM

## 2022-04-04 DIAGNOSIS — J449 Chronic obstructive pulmonary disease, unspecified: Secondary | ICD-10-CM

## 2022-04-04 DIAGNOSIS — I672 Cerebral atherosclerosis: Secondary | ICD-10-CM | POA: Diagnosis not present

## 2022-04-04 DIAGNOSIS — Z7983 Long term (current) use of bisphosphonates: Secondary | ICD-10-CM | POA: Diagnosis not present

## 2022-04-04 DIAGNOSIS — Z8673 Personal history of transient ischemic attack (TIA), and cerebral infarction without residual deficits: Secondary | ICD-10-CM | POA: Diagnosis not present

## 2022-04-04 DIAGNOSIS — Z981 Arthrodesis status: Secondary | ICD-10-CM | POA: Diagnosis not present

## 2022-04-04 DIAGNOSIS — I771 Stricture of artery: Secondary | ICD-10-CM | POA: Diagnosis not present

## 2022-04-04 DIAGNOSIS — I6503 Occlusion and stenosis of bilateral vertebral arteries: Secondary | ICD-10-CM | POA: Diagnosis not present

## 2022-04-04 DIAGNOSIS — I639 Cerebral infarction, unspecified: Secondary | ICD-10-CM | POA: Diagnosis present

## 2022-04-04 DIAGNOSIS — I70201 Unspecified atherosclerosis of native arteries of extremities, right leg: Secondary | ICD-10-CM | POA: Diagnosis not present

## 2022-04-04 DIAGNOSIS — E1169 Type 2 diabetes mellitus with other specified complication: Secondary | ICD-10-CM | POA: Diagnosis present

## 2022-04-04 DIAGNOSIS — I63233 Cerebral infarction due to unspecified occlusion or stenosis of bilateral carotid arteries: Secondary | ICD-10-CM | POA: Diagnosis not present

## 2022-04-04 DIAGNOSIS — G4733 Obstructive sleep apnea (adult) (pediatric): Secondary | ICD-10-CM | POA: Diagnosis present

## 2022-04-04 DIAGNOSIS — I959 Hypotension, unspecified: Secondary | ICD-10-CM

## 2022-04-04 DIAGNOSIS — I70222 Atherosclerosis of native arteries of extremities with rest pain, left leg: Secondary | ICD-10-CM | POA: Diagnosis present

## 2022-04-04 DIAGNOSIS — I745 Embolism and thrombosis of iliac artery: Secondary | ICD-10-CM | POA: Diagnosis present

## 2022-04-04 DIAGNOSIS — I63231 Cerebral infarction due to unspecified occlusion or stenosis of right carotid arteries: Secondary | ICD-10-CM | POA: Diagnosis present

## 2022-04-04 DIAGNOSIS — I700 Atherosclerosis of aorta: Secondary | ICD-10-CM | POA: Diagnosis not present

## 2022-04-04 DIAGNOSIS — Z8249 Family history of ischemic heart disease and other diseases of the circulatory system: Secondary | ICD-10-CM | POA: Diagnosis not present

## 2022-04-04 DIAGNOSIS — I2510 Atherosclerotic heart disease of native coronary artery without angina pectoris: Secondary | ICD-10-CM | POA: Diagnosis not present

## 2022-04-04 DIAGNOSIS — D649 Anemia, unspecified: Secondary | ICD-10-CM | POA: Diagnosis not present

## 2022-04-04 DIAGNOSIS — I6389 Other cerebral infarction: Secondary | ICD-10-CM | POA: Diagnosis not present

## 2022-04-04 DIAGNOSIS — G319 Degenerative disease of nervous system, unspecified: Secondary | ICD-10-CM | POA: Diagnosis not present

## 2022-04-04 DIAGNOSIS — R918 Other nonspecific abnormal finding of lung field: Secondary | ICD-10-CM | POA: Diagnosis not present

## 2022-04-04 DIAGNOSIS — F419 Anxiety disorder, unspecified: Secondary | ICD-10-CM | POA: Diagnosis present

## 2022-04-04 DIAGNOSIS — G8194 Hemiplegia, unspecified affecting left nondominant side: Secondary | ICD-10-CM | POA: Diagnosis present

## 2022-04-04 DIAGNOSIS — I10 Essential (primary) hypertension: Secondary | ICD-10-CM | POA: Diagnosis present

## 2022-04-04 DIAGNOSIS — Z833 Family history of diabetes mellitus: Secondary | ICD-10-CM | POA: Diagnosis not present

## 2022-04-04 DIAGNOSIS — Z79899 Other long term (current) drug therapy: Secondary | ICD-10-CM | POA: Diagnosis not present

## 2022-04-04 DIAGNOSIS — F17210 Nicotine dependence, cigarettes, uncomplicated: Secondary | ICD-10-CM | POA: Diagnosis present

## 2022-04-04 DIAGNOSIS — E1151 Type 2 diabetes mellitus with diabetic peripheral angiopathy without gangrene: Secondary | ICD-10-CM | POA: Diagnosis present

## 2022-04-04 DIAGNOSIS — Z888 Allergy status to other drugs, medicaments and biological substances status: Secondary | ICD-10-CM | POA: Diagnosis not present

## 2022-04-04 DIAGNOSIS — T82858A Stenosis of vascular prosthetic devices, implants and grafts, initial encounter: Secondary | ICD-10-CM | POA: Diagnosis not present

## 2022-04-04 DIAGNOSIS — Z01810 Encounter for preprocedural cardiovascular examination: Secondary | ICD-10-CM | POA: Diagnosis not present

## 2022-04-04 DIAGNOSIS — M47812 Spondylosis without myelopathy or radiculopathy, cervical region: Secondary | ICD-10-CM | POA: Diagnosis not present

## 2022-04-04 LAB — LIPID PANEL
Cholesterol: 149 mg/dL (ref 0–200)
HDL: 38 mg/dL — ABNORMAL LOW (ref >40)
LDL Cholesterol: 64 mg/dL (ref 0–99)
Total CHOL/HDL Ratio: 3.9 RATIO
Triglycerides: 236 mg/dL — ABNORMAL HIGH (ref <150)
VLDL: 47 mg/dL — ABNORMAL HIGH (ref 0–40)

## 2022-04-04 LAB — CBC
HCT: 32.1 % — ABNORMAL LOW (ref 36.0–46.0)
Hemoglobin: 10.1 g/dL — ABNORMAL LOW (ref 12.0–15.0)
MCH: 28.7 pg (ref 26.0–34.0)
MCHC: 31.5 g/dL (ref 30.0–36.0)
MCV: 91.2 fL (ref 80.0–100.0)
Platelets: 183 10*3/uL (ref 150–400)
RBC: 3.52 MIL/uL — ABNORMAL LOW (ref 3.87–5.11)
RDW: 15.6 % — ABNORMAL HIGH (ref 11.5–15.5)
WBC: 8.5 10*3/uL (ref 4.0–10.5)
nRBC: 0 % (ref 0.0–0.2)

## 2022-04-04 LAB — CREATININE, SERUM
Creatinine, Ser: 1.08 mg/dL — ABNORMAL HIGH (ref 0.44–1.00)
GFR, Estimated: 56 mL/min — ABNORMAL LOW (ref >60)

## 2022-04-04 LAB — HEMOGLOBIN A1C
Hgb A1c MFr Bld: 7.6 % — ABNORMAL HIGH (ref 4.8–5.6)
Mean Plasma Glucose: 171.42 mg/dL

## 2022-04-04 LAB — GLUCOSE, CAPILLARY
Glucose-Capillary: 118 mg/dL — ABNORMAL HIGH (ref 70–99)
Glucose-Capillary: 126 mg/dL — ABNORMAL HIGH (ref 70–99)

## 2022-04-04 MED ORDER — ENOXAPARIN SODIUM 40 MG/0.4ML IJ SOSY
40.0000 mg | PREFILLED_SYRINGE | INTRAMUSCULAR | Status: DC
  Administered 2022-04-04 – 2022-04-10 (×7): 40 mg via SUBCUTANEOUS
  Filled 2022-04-04 (×7): qty 0.4

## 2022-04-04 MED ORDER — NICOTINE 21 MG/24HR TD PT24
21.0000 mg | MEDICATED_PATCH | Freq: Every day | TRANSDERMAL | Status: DC
  Administered 2022-04-06 – 2022-04-13 (×8): 21 mg via TRANSDERMAL
  Filled 2022-04-04 (×11): qty 1

## 2022-04-04 MED ORDER — PANTOPRAZOLE SODIUM 40 MG PO TBEC
40.0000 mg | DELAYED_RELEASE_TABLET | Freq: Every day | ORAL | Status: DC
  Administered 2022-04-04 – 2022-04-13 (×10): 40 mg via ORAL
  Filled 2022-04-04 (×10): qty 1

## 2022-04-04 MED ORDER — METOPROLOL TARTRATE 25 MG PO TABS
12.5000 mg | ORAL_TABLET | Freq: Two times a day (BID) | ORAL | Status: DC
  Administered 2022-04-04 – 2022-04-06 (×4): 12.5 mg via ORAL
  Filled 2022-04-04 (×4): qty 1

## 2022-04-04 MED ORDER — SODIUM CHLORIDE 0.9 % IV SOLN: INTRAVENOUS | Status: DC

## 2022-04-04 MED ORDER — ACETAMINOPHEN 650 MG RE SUPP
650.0000 mg | RECTAL | Status: DC | PRN
Start: 2022-04-04 — End: 2022-04-11

## 2022-04-04 MED ORDER — SERTRALINE HCL 50 MG PO TABS
150.0000 mg | ORAL_TABLET | Freq: Every day | ORAL | Status: DC
  Administered 2022-04-04 – 2022-04-13 (×10): 150 mg via ORAL
  Filled 2022-04-04 (×10): qty 3

## 2022-04-04 MED ORDER — ACETAMINOPHEN 160 MG/5ML PO SOLN
650.0000 mg | ORAL | Status: DC | PRN
Start: 2022-04-04 — End: 2022-04-11

## 2022-04-04 MED ORDER — METOPROLOL TARTRATE 50 MG PO TABS
50.0000 mg | ORAL_TABLET | Freq: Two times a day (BID) | ORAL | Status: DC
  Administered 2022-04-04: 50 mg via ORAL
  Filled 2022-04-04: qty 2

## 2022-04-04 MED ORDER — ALBUTEROL SULFATE HFA 108 (90 BASE) MCG/ACT IN AERS: 2.0000 | INHALATION_SPRAY | RESPIRATORY_TRACT | Status: DC | PRN

## 2022-04-04 MED ORDER — INSULIN ASPART 100 UNIT/ML IJ SOLN
0.0000 [IU] | Freq: Three times a day (TID) | INTRAMUSCULAR | Status: DC
  Administered 2022-04-05 – 2022-04-07 (×4): 1 [IU] via SUBCUTANEOUS
  Administered 2022-04-07: 2 [IU] via SUBCUTANEOUS
  Administered 2022-04-07 – 2022-04-10 (×7): 1 [IU] via SUBCUTANEOUS
  Administered 2022-04-10: 2 [IU] via SUBCUTANEOUS
  Administered 2022-04-10: 1 [IU] via SUBCUTANEOUS
  Administered 2022-04-11: 2 [IU] via SUBCUTANEOUS
  Administered 2022-04-11: 1 [IU] via SUBCUTANEOUS
  Administered 2022-04-12: 5 [IU] via SUBCUTANEOUS
  Administered 2022-04-12 (×2): 1 [IU] via SUBCUTANEOUS
  Administered 2022-04-13: 2 [IU] via SUBCUTANEOUS
  Filled 2022-04-04 (×19): qty 1

## 2022-04-04 MED ORDER — ALBUTEROL SULFATE (2.5 MG/3ML) 0.083% IN NEBU: 2.5000 mg | INHALATION_SOLUTION | RESPIRATORY_TRACT | Status: DC | PRN

## 2022-04-04 MED ORDER — ASPIRIN 81 MG PO TBEC
81.0000 mg | DELAYED_RELEASE_TABLET | Freq: Every day | ORAL | Status: DC
  Administered 2022-04-04 – 2022-04-10 (×7): 81 mg via ORAL
  Filled 2022-04-04 (×7): qty 1

## 2022-04-04 MED ORDER — CLOPIDOGREL BISULFATE 75 MG PO TABS
75.0000 mg | ORAL_TABLET | Freq: Every day | ORAL | Status: DC
  Administered 2022-04-04 – 2022-04-13 (×9): 75 mg via ORAL
  Filled 2022-04-04 (×9): qty 1

## 2022-04-04 MED ORDER — ATORVASTATIN CALCIUM 20 MG PO TABS
80.0000 mg | ORAL_TABLET | Freq: Every evening | ORAL | Status: DC
  Administered 2022-04-04 – 2022-04-12 (×9): 80 mg via ORAL
  Filled 2022-04-04 (×9): qty 4

## 2022-04-04 MED ORDER — OXYCODONE-ACETAMINOPHEN 5-325 MG PO TABS
1.0000 | ORAL_TABLET | Freq: Once | ORAL | Status: AC
  Administered 2022-04-04: 1 via ORAL
  Filled 2022-04-04: qty 1

## 2022-04-04 MED ORDER — STROKE: EARLY STAGES OF RECOVERY BOOK: Freq: Once | Status: AC

## 2022-04-04 MED ORDER — SODIUM CHLORIDE 0.9 % IV SOLN: INTRAVENOUS | Status: AC

## 2022-04-04 MED ORDER — IOHEXOL 350 MG/ML SOLN
75.0000 mL | Freq: Once | INTRAVENOUS | Status: AC | PRN
  Administered 2022-04-04: 75 mL via INTRAVENOUS

## 2022-04-04 MED ORDER — INSULIN ASPART 100 UNIT/ML IJ SOLN
0.0000 [IU] | Freq: Every day | INTRAMUSCULAR | Status: DC
  Administered 2022-04-08 – 2022-04-09 (×2): 2 [IU] via SUBCUTANEOUS
  Filled 2022-04-04 (×2): qty 1

## 2022-04-04 MED ORDER — BUSPIRONE HCL 10 MG PO TABS
5.0000 mg | ORAL_TABLET | Freq: Two times a day (BID) | ORAL | Status: DC
  Administered 2022-04-04 – 2022-04-13 (×19): 5 mg via ORAL
  Filled 2022-04-04 (×20): qty 1

## 2022-04-04 MED ORDER — ACETAMINOPHEN 325 MG PO TABS
650.0000 mg | ORAL_TABLET | ORAL | Status: DC | PRN
  Administered 2022-04-04 – 2022-04-06 (×2): 650 mg via ORAL
  Filled 2022-04-04 (×3): qty 2

## 2022-04-04 MED ORDER — IOHEXOL 300 MG/ML  SOLN
75.0000 mL | Freq: Once | INTRAMUSCULAR | Status: AC | PRN
Start: 2022-04-04 — End: 2022-04-04
  Administered 2022-04-04: 75 mL via INTRAVENOUS

## 2022-04-04 NOTE — Assessment & Plan Note (Addendum)
MRI concerning for acute to early subacute right frontoparietal CVA.  She seems to have residual left lower extremity weakness which improved over hospitalization.  Per family, speech is back to baseline.  CTA head and neck with chronic right CCA occlusion.  TTE without significant finding.  LDL 64.  A1c 7.6%.   -SBP goal 120s to 140s.  Out of permissive HTN window. -Continue statin, Plavix and aspirin. -Encourage tobacco cessation. -Continue therapy

## 2022-04-04 NOTE — Consult Note (Signed)
NEURO HOSPITALIST CONSULT NOTE   Requestig physician: Dr. Cyndia Skeeters  Reason for Consult: Possible punctate stroke on MRI versus artifact  History obtained from:   Patient and Chart     HPI:                                                                                                                                          Autumn Arnold is an 71 y.o. female with a PMHx of stroke in 2011 without residual deficit, HTN, HLD, DM, anxiety, COPD, PVD (on Plavix) and status post angioplasty and stent placement in the left common iliac in August 2022 who presented to the ED on Tuesday evening with a c/c of right eye blurry vision and some difficulty with word-finding in conjunction with some left arm weakness with numbness. The symptoms lasted for about 2 hours prior to resolving spontaneously. She states that she had similar symptoms with a prior stroke in the past, so became worried and decided to be evaluated in the ED. She also endorses having a feeling of "dizziness" that she clarifies on further questioning as being a lightheaded sensation, rather than vertigo. She denied headache. Per family, she also has been found on the floor of her house for 2 nights in a row, but it is uncertain if she fell, slid or ended up there some other way.   CT head in the ED was read as normal.   Subsequent MRI revealed faint DWI signal that per Radiology could represent an acute stroke, but on review by Neurology appears most consistent with artifact versus incidental signal variation.   Past Medical History:  Diagnosis Date   Anxiety    Arthritis    Asthma    Bilateral lower extremity edema    COPD (chronic obstructive pulmonary disease) (HCC)    Cough    CHRONIC   Diabetes mellitus 2008   Edema    LEGS/FEET   GERD (gastroesophageal reflux disease)    Hyperlipidemia    Hypertension    Shortness of breath dyspnea    DOE   Sleep apnea    CPAP at [facility], Dr. Humphrey Rolls   Stroke [facility]) 02/2010    headache, left arm numbness   Wheezing     Past Surgical History:  Procedure Laterality Date   ABDOMINAL HYSTERECTOMY     ANTERIOR FUSION CERVICAL SPINE     CARPAL TUNNEL RELEASE     Bilateral   CATARACT EXTRACTION W/PHACO Left 03/12/2016   Procedure: CATARACT EXTRACTION PHACO AND INTRAOCULAR LENS PLACEMENT ([place]);  Surgeon: Estill Cotta, MD;  Location: [facility] ORS;  Service: Ophthalmology;  Laterality: Left;  [place] 01:12 AP% 23.4 CDE 29.83 fluid pack lot # [id number]   CATARACT EXTRACTION W/PHACO Right 04/16/2016   Procedure: CATARACT EXTRACTION PHACO AND  INTRAOCULAR LENS PLACEMENT ([facility]);  Surgeon: Estill Cotta, MD;  Location: [facility] ORS;  Service: Ophthalmology;  Laterality: Right;  [place] 01:17 AP% 22.5 CDE 35.08 Fluid pack lot # [id number]   CESAREAN SECTION     COLONOSCOPY WITH PROPOFOL N/A 08/12/2018   Procedure: COLONOSCOPY WITH PROPOFOL;  Surgeon: Toledo, Benay Pike, MD;  Location: [facility] ENDOSCOPY;  Service: Gastroenterology;  Laterality: N/A;   ELBOW SURGERY     Tendonitis   ESOPHAGOGASTRODUODENOSCOPY (EGD) WITH PROPOFOL N/A 08/12/2018   Procedure: ESOPHAGOGASTRODUODENOSCOPY (EGD) WITH PROPOFOL;  Surgeon: Toledo, Benay Pike, MD;  Location: [facility] ENDOSCOPY;  Service: Gastroenterology;  Laterality: N/A;   INCONTINENCE SURGERY     LOWER EXTREMITY ANGIOGRAPHY Left 06/23/2021   Procedure: Lower Extremity Angiography;  Surgeon: Katha Cabal, MD;  Location: [facility];  Service: Cardiovascular;  Laterality: Left;   TONSILLECTOMY      Family History  Problem Relation Age of Onset   CAD Mother    Hypertension Mother    Arthritis Mother    Diabetes Father    CAD Father              Social History:  reports that she has been smoking cigarettes. She has been smoking an average of 1 pack per day. She has never used smokeless tobacco. She reports current alcohol use of about 7.0 standard drinks per week. She reports that she does not use drugs.  Allergies  Allergen  Reactions   Lisinopril Other (See Comments)    Tongue burning sensation   Plavix [Clopidogrel] Other (See Comments)    MEDICATIONS:                                                                                                                     Prior to Admission:  Medications Prior to Admission  Medication Sig Dispense Refill Last Dose   alendronate (FOSAMAX) 70 MG tablet Take 70 mg by mouth once a week. Take with a full glass of water on an empty stomach.   Past Week   vitamin B-12 (CYANOCOBALAMIN) 1000 MCG tablet Take 1,000 mcg by mouth daily. Reported on 12/20/2015   Past Week   albuterol (VENTOLIN HFA) 108 (90 Base) MCG/ACT inhaler Inhale 2 puffs into the lungs as needed.   PRN at PRN   amitriptyline (ELAVIL) 150 MG tablet Take by mouth daily. Reported on 12/20/2015   04/02/2022 at NIGHT   aspirin EC 81 MG EC tablet Take 1 tablet (81 mg total) by mouth daily. 30 tablet 2 04/02/2022 at NIGHT   atorvastatin (LIPITOR) 80 MG tablet Take 80 mg by mouth daily.   04/02/2022 at NIGHT   busPIRone (BUSPAR) 5 MG tablet Take 5 mg by mouth 2 (two) times daily.      Calcium Carb-Cholecalciferol 600-400 MG-UNIT TABS Take 1 tablet by mouth 2 (two) times daily.   04/02/2022 at NIGHT   clopidogrel (PLAVIX) 75 MG tablet Take 1 tablet (75 mg total) by mouth daily. (Patient not taking: Reported on 02/26/2022)  30 tablet 2    gabapentin (NEURONTIN) 600 MG tablet Take 1,200 mg by mouth 2 (two) times daily.   04/02/2022 at NIGHT   HYDROcodone-acetaminophen (NORCO/VICODIN) 5-325 MG tablet Take 1-2 tablets by mouth every 8 (eight) hours as needed for moderate pain or severe pain. (Patient not taking: Reported on 02/26/2022) 10 tablet 0 Not Taking   metFORMIN (GLUCOPHAGE) 1000 MG tablet Take 1,000 mg by mouth 2 (two) times daily.   04/02/2022 at NIGHT   metoprolol tartrate (LOPRESSOR) 50 MG tablet Take 50 mg by mouth 2 (two) times daily.   04/02/2022 at NIGHT   montelukast (SINGULAIR) 10 MG tablet Take 10 mg by mouth daily.    04/02/2022 at NIGHT   Multiple Vitamin (MULTIVITAMIN WITH MINERALS) TABS tablet Take 1 tablet by mouth daily. 30 tablet 0 04/02/2022 at NIGHT   nicotine (NICODERM CQ - DOSED IN MG/24 HOURS) 21 mg/24hr patch Place 1 patch (21 mg total) onto the skin daily. (Patient not taking: Reported on 02/26/2022) 28 patch 0 Not Taking   pantoprazole (PROTONIX) 40 MG tablet Take 40 mg by mouth daily.   04/02/2022 at NIGHT   sertraline (ZOLOFT) 50 MG tablet Take 3 tablets (150 mg total) by mouth daily. 90 tablet 0 04/02/2022 at NIGHT   tiZANidine (ZANAFLEX) 4 MG tablet Take 4 mg by mouth 2 (two) times daily.   04/02/2022 at NIGHT   Scheduled:  aspirin EC  81 mg Oral Daily   atorvastatin  80 mg Oral QPM   busPIRone  5 mg Oral BID   clopidogrel  75 mg Oral Daily   enoxaparin (LOVENOX) injection  40 mg Subcutaneous Q24H   insulin aspart  0-5 Units Subcutaneous QHS   insulin aspart  0-9 Units Subcutaneous TID WC   metoprolol tartrate  12.5 mg Oral BID   nicotine  21 mg Transdermal Daily   pantoprazole  40 mg Oral Daily   sertraline  150 mg Oral Daily   Continuous:  sodium chloride 75 mL/hr at 04/04/22 1603     ROS:                                                                                                                                       As per HPI. Does not endorse any additional symptoms at the time of neurological evaluation.    Blood pressure 120/82, pulse 71, temperature 97.9 F (36.6 C), temperature source Oral, resp. rate 15, height 5' (1.524 m), weight 63.5 kg, SpO2 98 %.   General Examination:  Physical Exam  HEENT-  [place]/AT    Lungs- Respirations unlabored Extremities- No edema  Neurological Examination Mental Status: Awake, alert and oriented. Speech fluent with intact comprehension and naming.  Cranial Nerves: II: Temporal visual fields intact with no extinction to DSS. PERRL.    III,IV, VI: No ptosis. EOMI without nystagmus.  V: Temp sensation equal bilaterally.  VII: Smile symmetric  VIII: Hearing intact to voice IX,X: No hypophonia XI: Symmetric  XII: Midline tongue extension Motor: RUE 5/5 proximally and distally RLE 5/5 proximally and distally LUE 5/5 proximally and distally LLE 5/5 proximally and distally No pronator drift Sensory: Temp and light touch sensation intact throughout, bilaterally Deep Tendon Reflexes: 2+ and symmetric throughout Plantars: Right: downgoing  Left: downgoing Cerebellar: No ataxia with FNF bilaterally  Gait: Deferred    Lab Results: Basic Metabolic Panel: Recent Labs  Lab 04/03/22 1930 04/04/22 0614  NA 136  --   K 3.9  --   CL 102  --   CO2 24  --   GLUCOSE 116*  --   BUN 16  --   CREATININE 0.93 1.08*  CALCIUM 9.6  --     CBC: Recent Labs  Lab 04/03/22 1930 04/04/22 0614  WBC 11.8* 8.5  NEUTROABS 8.1*  --   HGB 11.2* 10.1*  HCT 35.5* 32.1*  MCV 91.3 91.2  PLT 213 183    Cardiac Enzymes: No results for input(s): CKTOTAL, CKMB, CKMBINDEX, TROPONINI in the last 168 hours.  Lipid Panel: Recent Labs  Lab 04/04/22 0614  CHOL 149  TRIG 236*  HDL 38*  CHOLHDL 3.9  VLDL 47*  LDLCALC 64    Imaging: DG Chest 2 View  Result Date: 04/03/2022 CLINICAL DATA:  Weakness EXAM: CHEST - 2 VIEW COMPARISON:  Chest x-ray 11/08/2011, CT abdomen pelvis 11/30/2021, CT abdomen pelvis 06/24/2021 FINDINGS: The heart and mediastinal contours are within normal limits. Atherosclerotic plaque. Interval development of patchy airspace opacity of the right lower lung zone. No pulmonary edema. No pleural effusion. No pneumothorax. No acute osseous abnormality. IMPRESSION: 1. Interval development of patchy airspace opacity of the right lower lung zone. Followup PA and lateral chest X-ray is recommended in 3-4 weeks following therapy to ensure resolution and exclude underlying malignancy. 2.  Aortic Atherosclerosis  (ICD10-I70.0). Electronically Signed   By: Iven Finn M.D.   On: 04/03/2022 19:58   DG Tibia/Fibula Left  Result Date: 04/03/2022 CLINICAL DATA:  Weakness EXAM: LEFT TIBIA AND FIBULA - 2 VIEW COMPARISON:  None Available. FINDINGS: There is no evidence of fracture or other focal bone lesions. Soft tissues are unremarkable. Atherosclerotic plaque. Partially visualized screw fixation of the foot. IMPRESSION: No acute displaced fracture or dislocation. Electronically Signed   By: Iven Finn M.D.   On: 04/03/2022 20:00   CT Head Wo Contrast  Result Date: 04/03/2022 CLINICAL DATA:  Blurred vision EXAM: CT HEAD WITHOUT CONTRAST TECHNIQUE: Contiguous axial images were obtained from the base of the skull through the vertex without intravenous contrast. RADIATION DOSE REDUCTION: This exam was performed according to the departmental dose-optimization program which includes automated exposure control, adjustment of the mA and/or kV according to patient size and/or use of iterative reconstruction technique. COMPARISON:  02/26/2022 FINDINGS: Brain: No evidence of acute infarction, hemorrhage, hydrocephalus, extra-axial collection or mass lesion/mass effect. Vascular: Intracranial atherosclerosis. Skull: Normal. Negative for fracture or focal lesion. Sinuses/Orbits: The visualized paranasal sinuses are essentially clear. The mastoid air cells are unopacified. Other: None. IMPRESSION: Normal head CT. Electronically Signed  By: Julian Hy M.D.   On: 04/03/2022 19:36   CT Chest W Contrast  Result Date: 04/04/2022 CLINICAL DATA:  Abnormal chest x-ray EXAM: CT CHEST WITH CONTRAST TECHNIQUE: Multidetector CT imaging of the chest was performed during intravenous contrast administration. RADIATION DOSE REDUCTION: This exam was performed according to the departmental dose-optimization program which includes automated exposure control, adjustment of the mA and/or kV according to patient size and/or use of  iterative reconstruction technique. CONTRAST:  5m OMNIPAQUE IOHEXOL 300 MG/ML  SOLN COMPARISON:  Chest x-ray 04/03/2022.  Chest CT 06/22/2021 FINDINGS: Cardiovascular: Diffuse coronary artery and scattered moderate aortic calcifications. Heart is normal size. Aorta is normal caliber. Mediastinum/Nodes: No mediastinal, hilar, or axillary adenopathy. Trachea and esophagus are unremarkable. Thyroid unremarkable. Lungs/Pleura: No confluent airspace opacities. In particular, no right lower lobe opacity as suggested on x-ray. No pleural effusions. Upper Abdomen: Imaging into the upper abdomen demonstrates no acute findings. Musculoskeletal: Chest wall soft tissues are unremarkable. No acute bony abnormality. IMPRESSION: No acute cardiopulmonary disease.  No right lower consolidation. Diffuse coronary artery disease. Aortic Atherosclerosis (ICD10-I70.0). Electronically Signed   By: KRolm BaptiseM.D.   On: 04/04/2022 01:08   MR BRAIN WO CONTRAST  Result Date: 04/04/2022 CLINICAL DATA:  Initial evaluation for neuro deficit, stroke suspected. EXAM: MRI HEAD WITHOUT CONTRAST TECHNIQUE: Multiplanar, multiecho pulse sequences of the brain and surrounding structures were obtained without intravenous contrast. COMPARISON:  Prior CT from 04/03/2022. FINDINGS: Brain: Examination degraded by motion artifact. Generalized age-related cerebral atrophy. Chronic microvascular ischemic disease noted involving the supratentorial cerebral white matter and pons. Few scattered remote lacunar infarcts present about the right thalamus, left basal ganglia, and left corona radiata. Tiny remote left cerebellar infarct. Subtle subcentimeter focus of diffusion signal abnormality seen involving the cortical/subcortical aspect of the right frontoparietal region (series 5, images 34, 33), suspicious for a tiny acute to early subacute ischemic infarct. Involvement of the pre and postcentral gyri. No associated hemorrhage or mass effect. No other  evidence for acute or subacute ischemia. Gray-white matter differentiation otherwise maintained. No areas of chronic cortical infarction. No visible acute or chronic intracranial blood products. No mass lesion, midline shift or mass effect. No hydrocephalus or extra-axial fluid collection. Vascular: Major intracranial vascular flow voids are maintained. Skull and upper cervical spine: Craniocervical junction within normal limits. Bone marrow signal intensity normal. No scalp soft tissue abnormality. Sinuses/Orbits: Prior bilateral ocular lens replacement. Paranasal sinuses are largely clear. Trace right mastoid effusion, of doubtful significance. Other: None. IMPRESSION: 1. Subtle subcentimeter focus of diffusion signal abnormality involving the cortical/subcortical aspect of the right frontoparietal region, suspicious for a tiny acute to early subacute ischemic infarct. No associated hemorrhage or mass effect. 2. No other acute intracranial abnormality. 3. Underlying age-related cerebral atrophy with chronic small vessel ischemic disease with multiple remote lacunar infarcts as above. Electronically Signed   By: BJeannine BogaM.D.   On: 04/04/2022 02:49   DG Hip Unilat With Pelvis 2-3 Views Left  Result Date: 04/03/2022 CLINICAL DATA:  Weakness EXAM: DG HIP (WITH OR WITHOUT PELVIS) 2-3V LEFT COMPARISON:  None Available. FINDINGS: Limited evaluation due to overlapping osseous structures and overlying soft tissues. There is no evidence of hip fracture or dislocation of the left hip. No acute displaced fracture or diastasis of the bones of the pelvis. Right hip grossly unremarkable. There is no evidence of arthropathy or other focal bone abnormality. Surgical tacks overlie the pelvis. Atherosclerotic plaque.  Aorto bi-iliac artery stent. IMPRESSION: 1. Negative for  acute traumatic injury. 2. Aortic Atherosclerosis (ICD10-I70.0) in a patient status post aorto bi-iliac artery stent. Electronically Signed    By: Iven Finn M.D.   On: 04/03/2022 20:00     Assessment:  71 y.o. female with a PMHx of stroke in 2011 without residual deficit, HTN, HLD, DM, anxiety, COPD, PVD (on Plavix) and status post angioplasty and stent placement in the left common iliac in August 2022 who presented to the ED on Tuesday evening with a c/c of right eye blurry vision and some difficulty with word-finding in conjunction with some left arm weakness with numbness. The symptoms lasted for about 2 hours prior to resolving spontaneously.  - Neurological exam is nonfocal.  - MRI brain:  - Official Radiology report conclusions: Subtle subcentimeter focus of diffusion signal abnormality involving the cortical/subcortical aspect of the right frontoparietal region, suspicious for a tiny acute to early subacute ischemic infarct. Also noted is underlying age-related cerebral atrophy with chronic small vessel ischemic disease and multiple remote lacunar infarcts, including an old right thalamic lacunar infarction. - Overread by Neurology: The right frontoparietal deep white matter faint/equivocal DWI hyperintensity appears most consistent with artifact versus incidental signal variation.  - CTA of head and neck: Unchanged occlusion of the right common carotid just beyond its origin to just proximal to the bifurcation. No new occlusion or hemodynamically significant stenosis. Plaque at the proximal right ICA causes less than 50% stenosis. Plaque at the proximal left ICA causes 50% stenosis.(comparison made with August 2022 study) - Overall impression: The patient's symptoms and risk factors militate in favor of a right MCA territory TIA as the etiology for her presentation. Her right monocular visual blurring suggests possible embolization to the retina of her right eye as well, most likely from a right ICA source. In this context, it is felt that the apparent right CCA occlusion on CTA may represent a severe stenosis given a degree of  patency (with narrowing by plaque) of the right ICA more distally.     Recommendations: 1. Continue ASA, Plavix and atorvastatin. No indication for emergent anticoagulation as the right CCA finding on CTA has been stable since August 2022.  2. BP management with goal SBP of 120-140. Out of the permissive HTN time window.  3. HgbA1c, fasting lipid panel 4. Carotid ultrasound with special attention to the right CCA to assess for possible trickle flow. If some CCA flow is demonstrated, then obtain Vascular Surgery consult.  5. PT consult, OT consult, Speech consult 6. Echocardiogram 7. Risk factor modification 8. Telemetry monitoring 9. Frequent neuro checks      Electronically signed: Dr. Kerney Elbe 04/04/2022, 8:40 AM

## 2022-04-04 NOTE — Assessment & Plan Note (Addendum)
Stable.  Continue buspirone, sertraline and amitriptyline

## 2022-04-04 NOTE — Progress Notes (Signed)
Admission profile updated. ?

## 2022-04-04 NOTE — Hospital Course (Signed)
71 year old F with PMH of CVA, OSA, DM-2, HTN, PVD s/p aortoiliac and SMA stent on DAPT and mild cognitive deficits, anxiety and tobacco use disorder presenting with altered mental status, blurry vision, aphasia and left arm numbness and weakness, and admitted for CVA evaluation.  She was hypotensive to 70s/40s.  MRI brain concerning for right frontoparietal acute to early subacute ischemic CVA.  Reportedly, her symptoms resolved in ED except for some residual left arm weakness and numbness.  There was also concern about critical left limb ischemia.  Neurology and vascular surgery consulted.  The next day, CTA head and neck.  Showed unchanged occlusion of right common carotid and plaque at the proximal right and left ICA causing less than 50% stenosis.  No new occlusion or hemodynamically significant stenosis noted.  TTE, carotid US and lower extremity angiography pending.  TTE with without significant finding.  Angiograph showed occluded right CCA filling through collaterals, 80 to 90% stenosis of the right subclavian artery, occluded left common iliac artery, patent right common iliac artery stent.  Vascular surgery recommended cardiology consultation for clearance before vascular intervention.  Cardiology consulted.

## 2022-04-04 NOTE — ED Notes (Signed)
Report given to the holding nurse in [place].

## 2022-04-04 NOTE — Evaluation (Signed)
Occupational Therapy Evaluation Patient Details Name: Autumn Arnold MRN: [id number] DOB: 12/30/1950 Today's Date: 04/04/2022   History of Present Illness Pt is a 71 year old female presenting to the ED with AMS, blurred vision, numbness/weakness of L arm and difficulty word finding. Pt all fell out of recliner 2x. MRI shows Subtle subcentimeter focus of diffusion signal abnormality involving the cortical/subcortical aspect of the right  frontoparietal region, suspicious for a tiny acute to early subacute  ischemic infarct. 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 Pt brother present throughout evaluation. Pt is alert, oriented x4, increased time required for processing and one step direction following. Pt brother reports since previous stroke pt has required increased time for processing in general. Pt participates in CLOX assessment, CLOX I 4/15, CLOX II 15/15. Pt presents with deficits affecting optimal and safe ADL completion including LUE strength/FMC/dexterity. Pt will continue to benefit from ongoing OT to address functional deficits.   Of note: pt brother reports potential concerns discharging home as pt husband available to provide 24/7 assist however also has recent falls. Pt reports she will have family to assist.     Recommendations for follow up therapy are one component of a multi-disciplinary discharge planning process, led by the attending physician.  Recommendations may be updated based on patient status, additional functional criteria and insurance authorization.   Follow Up Recommendations  Home health OT    Assistance Recommended at Discharge Frequent or constant Supervision/Assistance  Patient can return  home with the following A little help with walking and/or transfers;A little help with bathing/dressing/bathroom    Functional Status Assessment  Patient has had a recent decline in their functional status and demonstrates the ability to make significant improvements in function in a reasonable and predictable amount of time.  Equipment Recommendations  BSC/3in1;Tub/shower seat    Recommendations for Other Services       Precautions / Restrictions Precautions Precautions: Fall Restrictions Weight Bearing Restrictions: No      Mobility Bed Mobility Overal bed mobility: Modified Independent                  Transfers Overall transfer level: Needs assistance Equipment used: Rolling walker (2 wheels) Transfers: Sit to/from Stand Sit to Stand: Supervision           General transfer comment: vcs for safety      Balance Overall balance assessment: Needs assistance Sitting-balance support: Feet supported Sitting balance-Leahy Scale: Good     Standing balance support: During functional activity, No upper extremity supported Standing balance-Leahy Scale: Good                             ADL either performed or assessed with clinical judgement   ADL Overall ADL's : Needs assistance/impaired Eating/Feeding: Set up;Sitting   Grooming: Wash/dry hands;Sitting;Set up                   Toilet Transfer: Ambulation;BSC/3in1;Minimal assistance Toilet Transfer Details (indicate cue type and reason): HHA Toileting- Clothing Manipulation and Hygiene: Minimal assistance;Sit to/from stand       Functional mobility during ADLs: Min guard;Minimal assistance (HHA throughout room)       Vision  Patient Visual Report: Blurring of vision Vision Assessment?: Yes Ocular Range of Motion: Within Functional Limits Tracking/Visual Pursuits: Able to track stimulus in all quads without difficulty Additional Comments: limited vision assessment, pt reports  blurriness that comes and goes, able to read sign on wall;OT will continue to assess     Perception Perception Perception: Within Functional Limits   Praxis Praxis Praxis: Intact    Pertinent Vitals/Pain Pain Assessment Pain Assessment: No/denies pain     Hand Dominance Right   Extremity/Trunk Assessment Upper Extremity Assessment Upper Extremity Assessment: LUE deficits/detail LUE Deficits / Details: 4-/5 shoulder, elbow, wrist; mildly impaired grip strength LUE Sensation: WNL LUE Coordination: decreased fine motor   Lower Extremity Assessment Lower Extremity Assessment: Generalized weakness       Communication Communication Communication: No difficulties   Cognition Arousal/Alertness: Awake/alert Behavior During Therapy: WFL for tasks assessed/performed Overall Cognitive Status: Impaired/Different from baseline Area of Impairment: Problem solving, Safety/judgement, Awareness                         Safety/Judgement: Decreased awareness of deficits Awareness: Emergent Problem Solving: Slow processing, Requires verbal cues, Requires tactile cues General Comments: per brother pt appears to require increased time for processing     General Comments  vss throughout    Exercises Hand Exercises Digit Composite Flexion: AROM Composite Extension: AROM Other Exercises Other Exercises: edu re: role of OT, role of rehab, discharge recommendations, LUE HEP, home safety, falls prevention, DME use   Shoulder Instructions      Home Living Family/patient expects to be discharged to:: Private residence Living Arrangements: Spouse/significant other Available Help at Discharge: Family;Friend(s);Available PRN/intermittently Type of Home: House Home Access: Stairs to enter [employer] of Steps: 2 Entrance Stairs-Rails:  (column) Home Layout: One level               Home Equipment: Rollator (4 wheels);Rolling Walker (2 wheels);Cane - single point           Prior Functioning/Environment Prior Level of Function : Independent/Modified Independent             Mobility Comments: Pt reports amb with no AD, does not drive often ADLs Comments: pt reports MOD I with ADL/ IADL however reports husband has been driving to grocery store, doing grocery shopping        OT Problem List: Decreased strength;Impaired vision/perception;Decreased knowledge of use of DME or AE;Decreased coordination;Decreased activity tolerance;Cardiopulmonary status limiting activity;Impaired balance (sitting and/or standing);Decreased cognition;Decreased safety awareness      OT Treatment/Interventions: Self-care/ADL training;Therapeutic exercise;Neuromuscular education;DME and/or AE instruction;Cognitive remediation/compensation;Therapeutic activities;Balance training;Patient/family education;Visual/perceptual remediation/compensation    OT Goals(Current goals can be found in the care plan section) Acute Rehab OT Goals Patient Stated Goal: go home OT Goal Formulation: With patient/family Time For Goal Achievement: 04/12/22 Potential to Achieve Goals: Good ADL Goals Pt Will Perform Lower Body Dressing: with modified independence;sit to/from stand Pt Will Transfer to Toilet: with modified independence Pt Will Perform Toileting - Clothing Manipulation and hygiene: with modified independence;sit to/from stand Pt/caregiver will Perform Home Exercise Program: Left upper extremity;Increased strength;With written HEP provided  OT Frequency: Min 4X/week    Co-evaluation              AM-PAC OT "6 Clicks" Daily Activity     Outcome Measure Help from another person eating meals?: None Help from another person taking care of personal grooming?: None Help from another person toileting, which includes using toliet, bedpan, or urinal?:  A Little Help from another person bathing (including washing, rinsing, drying)?: A Little Help from another person to put on and  taking off regular upper body clothing?: A Little Help from another person to put on and taking off regular lower body clothing?: A Little 6 Click Score: 20   End of Session Equipment Utilized During Treatment: Oxygen Nurse Communication: Mobility status  Activity Tolerance: Patient tolerated treatment well Patient left: with call bell/phone within reach;with family/visitor present (at edge of bed eating lunch)  OT Visit Diagnosis: Unsteadiness on feet (R26.81);Repeated falls (R29.6);History of falling (Z91.81);Other symptoms and signs involving the nervous system (R29.898)                Time: 1349-1420 OT Time Calculation (min): 31 min Charges:  OT General Charges $OT Visit: 1 Visit OT Evaluation $OT Eval Moderate Complexity: 1 Mod OT Treatments $Self Care/Home Management : 8-22 mins  Shanon Payor, OTD OTR/L  04/04/22, 3:50 PM

## 2022-04-04 NOTE — Assessment & Plan Note (Deleted)
History of left frontal and parietal CVA 06/2021 presented with left-sided weakness.  Has no residual deficits Currently on aspirin 81 and atorvastatin 80

## 2022-04-04 NOTE — ED Notes (Signed)
Pt back in the ED.

## 2022-04-04 NOTE — ED Notes (Signed)
Pt asleep, breathing easy and unlabored, call bell at the bedside, will continue to monitor.

## 2022-04-04 NOTE — H&P (View-Only) (Signed)
[facility] Vascular Consult Note  MRN : [id number]  PRISILLA Arnold is a 71 y.o. (July 06, 1951) female who presents with chief complaint of  Chief Complaint  Patient presents with   Altered Mental Status  .   Consulting Physician: Judd Gaudier, MD Reason for consult: Leg Ischemia  History of Present Illness: Autumn Arnold is a 71 year old female with a history of peripheral arterial disease, carotid stenosis and superior mesenteric artery stenosis.  She additionally has a past medical history of diabetes mellitus, previous CVA in 2022.  The patient was originally scheduled to be seen in our office today for follow-up of her peripheral arterial disease.  She was seen by her PCP on 03/27/2022 and noted to have a cool discolored foot.  She presented to [facility] following concerns of a stroke.  She had altered mental status, blurred vision and difficulty with finding words.  She also had numbness weakness of her left arm.  It was also noted that the past 2 nights the patient woke up on the floor beside her recliner.  She notes that she has been sleeping and required for months that she has been having pain in her left lower extremity for the last 3 months.  The pain was worse anytime she would lay down in bed, which is consistent for rest pain.  Previous review of her noninvasive studies show an ABI of 0.51 in the left lower extremity on 07/27/2021.  She has had previous vascular intervention on 06/23/2021 with angioplasty and stent placement of the SMA with additional angioplasty and stent placement to the infrarenal aorta with bilateral common iliac stents.  No evidence of atrial fibrillation on the monitor.  CT chest shows no evidence of thoracic aortic aneurysm.  The patient also underwent a CT angio neck which was independently reviewed by myself which notes a 65 to 70% stenosis on the left ICA with an occlusion of the right common carotid artery.  However  it appears that the ICA is being filled by collaterals from the external carotid artery.  Right ICA has approximately an 80% stenosis.  Current Facility-Administered Medications  Medication Dose Route Frequency Provider Last Rate Last Admin    stroke: early stages of recovery book   Does not apply Once Athena Masse, MD       0.9 %  sodium chloride infusion   Intravenous Continuous Athena Masse, MD 100 mL/hr at 04/04/22 0504 New Bag at 04/04/22 0504   acetaminophen (TYLENOL) tablet 650 mg  650 mg Oral Q4H PRN Athena Masse, MD   650 mg at 04/04/22 7829   Or   acetaminophen (TYLENOL) 160 MG/5ML solution 650 mg  650 mg Per Tube Q4H PRN Athena Masse, MD       Or   acetaminophen (TYLENOL) suppository 650 mg  650 mg Rectal Q4H PRN Athena Masse, MD       albuterol (PROVENTIL) (2.5 MG/3ML) 0.083% nebulizer solution 2.5 mg  2.5 mg Nebulization Q4H PRN Renda Rolls, RPH       aspirin EC tablet 81 mg  81 mg Oral Daily Judd Gaudier V, MD   81 mg at 04/04/22 0900   atorvastatin (LIPITOR) tablet 80 mg  80 mg Oral QPM Athena Masse, MD       busPIRone (BUSPAR) tablet 5 mg  5 mg Oral BID Judd Gaudier V, MD   5 mg at 04/04/22 0900   clopidogrel (PLAVIX) tablet 75 mg  75 mg Oral Daily Judd Gaudier V, MD   75 mg at 04/04/22 0900   enoxaparin (LOVENOX) injection 40 mg  40 mg Subcutaneous Q24H Judd Gaudier V, MD   40 mg at 04/04/22 0856   metoprolol tartrate (LOPRESSOR) tablet 50 mg  50 mg Oral BID Athena Masse, MD   50 mg at 04/04/22 0900   nicotine (NICODERM CQ - dosed in mg/24 hours) patch 21 mg  21 mg Transdermal Daily Athena Masse, MD       pantoprazole (PROTONIX) EC tablet 40 mg  40 mg Oral Daily Judd Gaudier V, MD   40 mg at 04/04/22 0900   sertraline (ZOLOFT) tablet 150 mg  150 mg Oral Daily Athena Masse, MD   150 mg at 04/04/22 0900   Current Outpatient Medications  Medication Sig Dispense Refill   alendronate (FOSAMAX) 70 MG tablet Take 70 mg by mouth once a week. Take  with a full glass of water on an empty stomach.     vitamin B-12 (CYANOCOBALAMIN) 1000 MCG tablet Take 1,000 mcg by mouth daily. Reported on 12/20/2015     albuterol (VENTOLIN HFA) 108 (90 Base) MCG/ACT inhaler Inhale 2 puffs into the lungs as needed.     amitriptyline (ELAVIL) 150 MG tablet Take by mouth daily. Reported on 12/20/2015     aspirin EC 81 MG EC tablet Take 1 tablet (81 mg total) by mouth daily. 30 tablet 2   atorvastatin (LIPITOR) 80 MG tablet Take 80 mg by mouth daily.     busPIRone (BUSPAR) 5 MG tablet Take 5 mg by mouth 2 (two) times daily.     Calcium Carb-Cholecalciferol 600-400 MG-UNIT TABS Take 1 tablet by mouth 2 (two) times daily.     clopidogrel (PLAVIX) 75 MG tablet Take 1 tablet (75 mg total) by mouth daily. (Patient not taking: Reported on 02/26/2022) 30 tablet 2   gabapentin (NEURONTIN) 600 MG tablet Take 1,200 mg by mouth 2 (two) times daily.     HYDROcodone-acetaminophen (NORCO/VICODIN) 5-325 MG tablet Take 1-2 tablets by mouth every 8 (eight) hours as needed for moderate pain or severe pain. (Patient not taking: Reported on 02/26/2022) 10 tablet 0   metFORMIN (GLUCOPHAGE) 1000 MG tablet Take 1,000 mg by mouth 2 (two) times daily.     metoprolol tartrate (LOPRESSOR) 50 MG tablet Take 50 mg by mouth 2 (two) times daily.     montelukast (SINGULAIR) 10 MG tablet Take 10 mg by mouth daily.     Multiple Vitamin (MULTIVITAMIN WITH MINERALS) TABS tablet Take 1 tablet by mouth daily. 30 tablet 0   nicotine (NICODERM CQ - DOSED IN MG/24 HOURS) 21 mg/24hr patch Place 1 patch (21 mg total) onto the skin daily. (Patient not taking: Reported on 02/26/2022) 28 patch 0   pantoprazole (PROTONIX) 40 MG tablet Take 40 mg by mouth daily.     sertraline (ZOLOFT) 50 MG tablet Take 3 tablets (150 mg total) by mouth daily. 90 tablet 0   tiZANidine (ZANAFLEX) 4 MG tablet Take 4 mg by mouth 2 (two) times daily.      Past Medical History:  Diagnosis Date   Anxiety    Arthritis    Asthma     Bilateral lower extremity edema    COPD (chronic obstructive pulmonary disease) (HCC)    Cough    CHRONIC   Diabetes mellitus 2008   Edema    LEGS/FEET   GERD (gastroesophageal reflux disease)    Hyperlipidemia  Hypertension    Shortness of breath dyspnea    DOE   Sleep apnea    CPAP at [facility], Dr. Humphrey Rolls   Stroke [facility]) 02/2010   headache, left arm numbness   Wheezing     Past Surgical History:  Procedure Laterality Date   ABDOMINAL HYSTERECTOMY     ANTERIOR FUSION CERVICAL SPINE     CARPAL TUNNEL RELEASE     Bilateral   CATARACT EXTRACTION W/PHACO Left 03/12/2016   Procedure: CATARACT EXTRACTION PHACO AND INTRAOCULAR LENS PLACEMENT ([place]);  Surgeon: Estill Cotta, MD;  Location: [facility] ORS;  Service: Ophthalmology;  Laterality: Left;  [place] 01:12 AP% 23.4 CDE 29.83 fluid pack lot # [id number]   CATARACT EXTRACTION W/PHACO Right 04/16/2016   Procedure: CATARACT EXTRACTION PHACO AND INTRAOCULAR LENS PLACEMENT ([facility]);  Surgeon: Estill Cotta, MD;  Location: [facility] ORS;  Service: Ophthalmology;  Laterality: Right;  [place] 01:17 AP% 22.5 CDE 35.08 Fluid pack lot # [id number]   CESAREAN SECTION     COLONOSCOPY WITH PROPOFOL N/A 08/12/2018   Procedure: COLONOSCOPY WITH PROPOFOL;  Surgeon: Toledo, Benay Pike, MD;  Location: [facility] ENDOSCOPY;  Service: Gastroenterology;  Laterality: N/A;   ELBOW SURGERY     Tendonitis   ESOPHAGOGASTRODUODENOSCOPY (EGD) WITH PROPOFOL N/A 08/12/2018   Procedure: ESOPHAGOGASTRODUODENOSCOPY (EGD) WITH PROPOFOL;  Surgeon: Toledo, Benay Pike, MD;  Location: [facility] ENDOSCOPY;  Service: Gastroenterology;  Laterality: N/A;   INCONTINENCE SURGERY     LOWER EXTREMITY ANGIOGRAPHY Left 06/23/2021   Procedure: Lower Extremity Angiography;  Surgeon: Katha Cabal, MD;  Location: [facility];  Service: Cardiovascular;  Laterality: Left;   TONSILLECTOMY      Social History Social History   Tobacco Use   Smoking status: Every Day    Packs/day: 1.00    Types:  Cigarettes   Smokeless tobacco: Never  Substance Use Topics   Alcohol use: Yes    Alcohol/week: 7.0 standard drinks    Types: 7 Cans of beer per week   Drug use: No    Family History Family History  Problem Relation Age of Onset   CAD Mother    Hypertension Mother    Arthritis Mother    Diabetes Father    CAD Father     Allergies  Allergen Reactions   Lisinopril Other (See Comments)    Tongue burning sensation   Plavix [Clopidogrel] Other (See Comments)     REVIEW OF SYSTEMS (Negative unless checked)  Constitutional: '[]'$ Weight loss  '[]'$ Fever  '[]'$ Chills Cardiac: '[]'$ Chest pain   '[]'$ Chest pressure   '[]'$ Palpitations   '[]'$ Shortness of breath when laying flat   '[]'$ Shortness of breath at rest   '[]'$ Shortness of breath with exertion. Vascular:  '[]'$ Pain in legs with walking   '[]'$ Pain in legs at rest   '[]'$ Pain in legs when laying flat   '[]'$ Claudication   '[]'$ Pain in feet when walking  '[]'$ Pain in feet at rest  '[]'$ Pain in feet when laying flat   '[]'$ History of DVT   '[]'$ Phlebitis   '[]'$ Swelling in legs   '[]'$ Varicose veins   '[]'$ Non-healing ulcers Pulmonary:   '[]'$ Uses home oxygen   '[]'$ Productive cough   '[]'$ Hemoptysis   '[]'$ Wheeze  '[]'$ COPD   '[]'$ Asthma Neurologic:  '[]'$ Dizziness  '[]'$ Blackouts   '[]'$ Seizures   '[x]'$ History of stroke   '[]'$ History of TIA  '[]'$ Aphasia   '[]'$ Temporary blindness   '[]'$ Dysphagia   '[]'$ Weakness or numbness in arms   '[x]'$ Weakness or numbness in legs Musculoskeletal:  '[]'$ Arthritis   '[]'$ Joint swelling   '[]'$ Joint pain   '[]'$   Low back pain Hematologic:  '[]'$ Easy bruising  '[]'$ Easy bleeding   '[]'$ Hypercoagulable state   '[]'$ Anemic  '[]'$ Hepatitis Gastrointestinal:  '[]'$ Blood in stool   '[]'$ Vomiting blood  '[]'$ Gastroesophageal reflux/heartburn   '[]'$ Difficulty swallowing. Genitourinary:  '[]'$ Chronic kidney disease   '[]'$ Difficult urination  '[]'$ Frequent urination  '[]'$ Burning with urination   '[]'$ Blood in urine Skin:  '[]'$ Rashes   '[]'$ Ulcers   '[]'$ Wounds Psychological:  '[]'$ History of anxiety   '[]'$  History of major depression.  Physical Examination  Vitals:    04/04/22 0300 04/04/22 0400 04/04/22 0500 04/04/22 0548  BP: (!) 142/63 (!) 119/48 123/75 120/82  Pulse: 77 73 74 71  Resp: (!) 21 19 (!) 28 15  Temp:      TempSrc:      SpO2: 100% 95% 98% 98%  Weight:      Height:  5' (1.524 m)     Body mass index is 27.34 kg/m. Gen:  WD/WN, NAD Head: Platter/AT, No temporalis wasting. Prominent temp pulse not noted. Ear/Nose/Throat: Hearing grossly intact, nares w/o erythema or drainage, oropharynx w/o Erythema/Exudate Eyes: Sclera non-icteric, conjunctiva clear Neck: Trachea midline.  No JVD.  Pulmonary:  Good air movement, respirations not labored, equal bilaterally.  Cardiac: RRR, normal S1, S2. Vascular: Slight discoloration to left foot but warm.  Nonpalpable pulses bilaterally  Gastrointestinal: soft, non-tender/non-distended. No guarding/reflex.  Musculoskeletal: M/S 5/5 throughout.  Extremities without ischemic changes.  No deformity or atrophy. No edema. Neurologic: Sensation grossly intact in extremities.  Symmetrical.  Speech is fluent. Motor exam as listed above. Psychiatric: Judgment intact, Mood & affect appropriate for pt's clinical situation. Dermatologic: No rashes or ulcers noted.  No cellulitis or open wounds. Lymph : No Cervical, Axillary, or Inguinal lymphadenopathy.    CBC Lab Results  Component Value Date   WBC 8.5 04/04/2022   HGB 10.1 (L) 04/04/2022   HCT 32.1 (L) 04/04/2022   MCV 91.2 04/04/2022   PLT 183 04/04/2022    BMET    Component Value Date/Time   NA 136 04/03/2022 1930   K 3.9 04/03/2022 1930   CL 102 04/03/2022 1930   CO2 24 04/03/2022 1930   GLUCOSE 116 (H) 04/03/2022 1930   BUN 16 04/03/2022 1930   CREATININE 1.08 (H) 04/04/2022 0614   CALCIUM 9.6 04/03/2022 1930   GFRNONAA 56 (L) 04/04/2022 0614   GFRAA >60 11/08/2015 0419   Estimated Creatinine Clearance: 40.9 mL/min (A) (by C-G formula based on SCr of 1.08 mg/dL (H)).  COAG Lab Results  Component Value Date   INR 1.0 06/22/2021   INR  0.89 11/07/2015    Radiology DG Chest 2 View  Result Date: 04/03/2022 CLINICAL DATA:  Weakness EXAM: CHEST - 2 VIEW COMPARISON:  Chest x-ray 11/08/2011, CT abdomen pelvis 11/30/2021, CT abdomen pelvis 06/24/2021 FINDINGS: The heart and mediastinal contours are within normal limits. Atherosclerotic plaque. Interval development of patchy airspace opacity of the right lower lung zone. No pulmonary edema. No pleural effusion. No pneumothorax. No acute osseous abnormality. IMPRESSION: 1. Interval development of patchy airspace opacity of the right lower lung zone. Followup PA and lateral chest X-ray is recommended in 3-4 weeks following therapy to ensure resolution and exclude underlying malignancy. 2.  Aortic Atherosclerosis (ICD10-I70.0). Electronically Signed   By: Iven Finn M.D.   On: 04/03/2022 19:58   DG Tibia/Fibula Left  Result Date: 04/03/2022 CLINICAL DATA:  Weakness EXAM: LEFT TIBIA AND FIBULA - 2 VIEW COMPARISON:  None Available. FINDINGS: There is no evidence of fracture or other focal bone lesions. Soft  tissues are unremarkable. Atherosclerotic plaque. Partially visualized screw fixation of the foot. IMPRESSION: No acute displaced fracture or dislocation. Electronically Signed   By: Iven Finn M.D.   On: 04/03/2022 20:00   CT Head Wo Contrast  Result Date: 04/03/2022 CLINICAL DATA:  Blurred vision EXAM: CT HEAD WITHOUT CONTRAST TECHNIQUE: Contiguous axial images were obtained from the base of the skull through the vertex without intravenous contrast. RADIATION DOSE REDUCTION: This exam was performed according to the departmental dose-optimization program which includes automated exposure control, adjustment of the mA and/or kV according to patient size and/or use of iterative reconstruction technique. COMPARISON:  02/26/2022 FINDINGS: Brain: No evidence of acute infarction, hemorrhage, hydrocephalus, extra-axial collection or mass lesion/mass effect. Vascular: Intracranial  atherosclerosis. Skull: Normal. Negative for fracture or focal lesion. Sinuses/Orbits: The visualized paranasal sinuses are essentially clear. The mastoid air cells are unopacified. Other: None. IMPRESSION: Normal head CT. Electronically Signed   By: Julian Hy M.D.   On: 04/03/2022 19:36   CT Chest W Contrast  Result Date: 04/04/2022 CLINICAL DATA:  Abnormal chest x-ray EXAM: CT CHEST WITH CONTRAST TECHNIQUE: Multidetector CT imaging of the chest was performed during intravenous contrast administration. RADIATION DOSE REDUCTION: This exam was performed according to the departmental dose-optimization program which includes automated exposure control, adjustment of the mA and/or kV according to patient size and/or use of iterative reconstruction technique. CONTRAST:  14m OMNIPAQUE IOHEXOL 300 MG/ML  SOLN COMPARISON:  Chest x-ray 04/03/2022.  Chest CT 06/22/2021 FINDINGS: Cardiovascular: Diffuse coronary artery and scattered moderate aortic calcifications. Heart is normal size. Aorta is normal caliber. Mediastinum/Nodes: No mediastinal, hilar, or axillary adenopathy. Trachea and esophagus are unremarkable. Thyroid unremarkable. Lungs/Pleura: No confluent airspace opacities. In particular, no right lower lobe opacity as suggested on x-ray. No pleural effusions. Upper Abdomen: Imaging into the upper abdomen demonstrates no acute findings. Musculoskeletal: Chest wall soft tissues are unremarkable. No acute bony abnormality. IMPRESSION: No acute cardiopulmonary disease.  No right lower consolidation. Diffuse coronary artery disease. Aortic Atherosclerosis (ICD10-I70.0). Electronically Signed   By: KRolm BaptiseM.D.   On: 04/04/2022 01:08   MR BRAIN WO CONTRAST  Result Date: 04/04/2022 CLINICAL DATA:  Initial evaluation for neuro deficit, stroke suspected. EXAM: MRI HEAD WITHOUT CONTRAST TECHNIQUE: Multiplanar, multiecho pulse sequences of the brain and surrounding structures were obtained without  intravenous contrast. COMPARISON:  Prior CT from 04/03/2022. FINDINGS: Brain: Examination degraded by motion artifact. Generalized age-related cerebral atrophy. Chronic microvascular ischemic disease noted involving the supratentorial cerebral white matter and pons. Few scattered remote lacunar infarcts present about the right thalamus, left basal ganglia, and left corona radiata. Tiny remote left cerebellar infarct. Subtle subcentimeter focus of diffusion signal abnormality seen involving the cortical/subcortical aspect of the right frontoparietal region (series 5, images 34, 33), suspicious for a tiny acute to early subacute ischemic infarct. Involvement of the pre and postcentral gyri. No associated hemorrhage or mass effect. No other evidence for acute or subacute ischemia. Gray-white matter differentiation otherwise maintained. No areas of chronic cortical infarction. No visible acute or chronic intracranial blood products. No mass lesion, midline shift or mass effect. No hydrocephalus or extra-axial fluid collection. Vascular: Major intracranial vascular flow voids are maintained. Skull and upper cervical spine: Craniocervical junction within normal limits. Bone marrow signal intensity normal. No scalp soft tissue abnormality. Sinuses/Orbits: Prior bilateral ocular lens replacement. Paranasal sinuses are largely clear. Trace right mastoid effusion, of doubtful significance. Other: None. IMPRESSION: 1. Subtle subcentimeter focus of diffusion signal abnormality involving the cortical/subcortical aspect  of the right frontoparietal region, suspicious for a tiny acute to early subacute ischemic infarct. No associated hemorrhage or mass effect. 2. No other acute intracranial abnormality. 3. Underlying age-related cerebral atrophy with chronic small vessel ischemic disease with multiple remote lacunar infarcts as above. Electronically Signed   By: Jeannine Boga M.D.   On: 04/04/2022 02:49   DG Hip Unilat  With Pelvis 2-3 Views Left  Result Date: 04/03/2022 CLINICAL DATA:  Weakness EXAM: DG HIP (WITH OR WITHOUT PELVIS) 2-3V LEFT COMPARISON:  None Available. FINDINGS: Limited evaluation due to overlapping osseous structures and overlying soft tissues. There is no evidence of hip fracture or dislocation of the left hip. No acute displaced fracture or diastasis of the bones of the pelvis. Right hip grossly unremarkable. There is no evidence of arthropathy or other focal bone abnormality. Surgical tacks overlie the pelvis. Atherosclerotic plaque.  Aorto bi-iliac artery stent. IMPRESSION: 1. Negative for acute traumatic injury. 2. Aortic Atherosclerosis (ICD10-I70.0) in a patient status post aorto bi-iliac artery stent. Electronically Signed   By: Iven Finn M.D.   On: 04/03/2022 20:00      Assessment/Plan 1.  Peripheral arterial disease  In discussion with the patient and the timeline of her symptoms I suspect that this may be an acute on chronic occlusion.  It appears that she has been having rest pain for several months prior to presentation today.  Patient should undergo revascularization.  Given the patient's recent stroke, we will plan for revascularization if it is suitable from a neurology standpoint given that the patient may need tPA.  We will make patient n.p.o. for possible revascularization later today.  If today is not possible we will plan for tomorrow.  2.  Carotid stenosis  Today's CTA shows occlusion of her right common carotid artery, however the ICA appears to be filling through ECA collaterals.  The right ICA does appear to have an 80% stenosis.  The left ICA has a 65 to 70% stenosis.  Given her recent stroke it would be prudent for the patient to undergo a carotid angiogram to evaluate for possible revascularization options.  This can possibly be done at the same time as her lower extremity revascularization because there no planned interventions.  3.  Diabetes mellitus  more Recommended tight control in setting of multiple atherosclerotic risk factors   Family Communication: Family at bedside   Kris Hartmann, NP [facility] [phone] (Office Phone) [phone] (Office Fax)  04/04/2022 9:17 AM    This note was created with Dragon medical transcription system.  Any error is purely unintentional

## 2022-04-04 NOTE — Progress Notes (Signed)
PROGRESS NOTE  Autumn Arnold MCN:[id number] DOB: 1951/01/06   PCP: [name], MD  Patient is from: Home.  Lives with husband.  DOA: 04/04/2022 LOS: 0  Chief complaints Chief Complaint  Patient presents with   Altered Mental Status     Brief Narrative / Interim history: 71 year old F with PMH of CVA, OSA, DM-2, HTN, PVD s/p aortoiliac and SMA stent on DAPT and mild cognitive deficits, anxiety and tobacco use disorder presenting with altered mental status, blurry vision, aphasia and left arm numbness and weakness, and admitted for CVA evaluation.  She was hypotensive to 70s/40s.  MRI brain concerning for right frontoparietal acute to early subacute ischemic CVA.  Reportedly, her symptoms resolved in ED except for some residual left arm weakness and numbness.  There was also concern about critical left limb ischemia.  Neurology and vascular surgery consulted.  The next day, CTA head and neck.  Showed unchanged occlusion of right common carotid and plaque at the proximal right and left ICA causing less than 50% stenosis.  No new occlusion or hemodynamically significant stenosis noted.  TTE pending.   Subjective: Seen and examined earlier this morning.  No major events overnight of this morning.  Feels better.  Then this morning other than some pain and weakness in her left leg.  Husband and brother at bedside.  Per family, his speech is back to normal.  He is fairly oriented.   Objective: Vitals:   04/04/22 0548 04/04/22 1000 04/04/22 1300 04/04/22 1525  BP: 120/82 (!) 117/59 (!) 131/50   Pulse: 71 77 65   Resp: '15 20 12   '$ Temp:      TempSrc:      SpO2: 98% 99% 97% 97%  Weight:      Height:        Examination:  GENERAL: No apparent distress.  Nontoxic. HEENT: MMM.  Vision and hearing grossly intact.  NECK: Supple.  No apparent JVD.  RESP:  No IWOB.  Fair aeration bilaterally. CVS:  RRR. Heart sounds normal.  ABD/GI/GU: BS+. Abd soft, NTND.  MSK/EXT:  Moves  extremities. No apparent deformity. No edema.  SKIN: no apparent skin lesion or wound NEURO: Awake, alert and oriented to self, family, place, month and year.  Speech clear. Cranial nerves II-XII grossly, intact. Motor 4/5 with left hip flexion and left ankle dorsiflexion. 5/5 elsewhere.  Light sensation intact in all dermatomes of upper and lower ext bilaterally. Patellar reflex symmetric.  No pronator drift.  Finger to nose intact. PSYCH: Calm. Normal affect.   Procedures:  None  Microbiology summarized: None  Assessment and Plan: * Acute CVA (cerebrovascular accident) [facility]) MRI concerning for acute to early subacute right frontoparietal CVA.  She seems to have residual left lower extremity weakness.  Per family, speech is back to baseline.  CTA head and neck with chronic right common carotid artery occlusion.  She is already on DAPT for PVD -Follow echocardiogram -Permissive hypertension given right common artery or occlusion with acute CVA. -Continue statin, Plavix and aspirin. -PT/OT/SLP -Encourage tobacco cessation.    Hypotension BP was 70/48.  Now normotensive. -Permissive hypertension -Continue IV fluid -Decrease metoprolol to 12.5 mg twice daily  Critical limb ischemia of left lower extremity (HCC) PVD s/p aorto iliac and SMA stent 06/2021.  She has some left foot erythema with no palpable DP or PT pulse on my exam. -Vascular surgery following -Continue Plavix, aspirin and atorvastatin. -Encouraged smoking cessation  Chronic anemia Recent Labs    06/24/21  [place] 06/24/21 2347 06/25/21 0644 06/25/21 1550 06/26/21 0500 06/27/21 0338 06/28/21 0545 02/26/22 0200 04/03/22 1930 04/04/22 0614  HGB 8.0* 7.8* 7.3* 10.5* 9.1* 9.3* 9.5* 11.5* 11.2* 10.1*  H&H relatively stable. -Check anemia panel in the morning   Type 2 diabetes mellitus with hyperlipidemia (HCC) A1c 7.6%. Recent Labs  Lab 04/03/22 [place] 123*  Continue SSI and statin   COPD (chronic  obstructive pulmonary disease) (HCC) Stable.  Over 60-pack-year history.  Currently smokes about a pack a day.  Discussed the importance of smoking cessation.  -Nicotine patch as needed  Tobacco dependence Nicotine patch and tobacco cessation counseling  OSA (obstructive sleep apnea) Continue oxygen at night  Anxiety and depression Stable.  Continue buspirone, sertraline and amitriptyline     DVT prophylaxis:  enoxaparin (LOVENOX) injection 40 mg Start: 04/04/22 0800  Code Status: Full code Family Communication: Updated patient's husband and brother at bedside. Level of care: Telemetry Medical Status is: Inpatient Remains inpatient appropriate because: Acute CVA and critical left limb ischemia   Final disposition: TBD Consultants:  Neurology Vascular surgery  Sch Meds:  Scheduled Meds:   stroke: early stages of recovery book   Does not apply Once   aspirin EC  81 mg Oral Daily   atorvastatin  80 mg Oral QPM   busPIRone  5 mg Oral BID   clopidogrel  75 mg Oral Daily   enoxaparin (LOVENOX) injection  40 mg Subcutaneous Q24H   metoprolol tartrate  12.5 mg Oral BID   nicotine  21 mg Transdermal Daily   pantoprazole  40 mg Oral Daily   sertraline  150 mg Oral Daily   Continuous Infusions:  sodium chloride     PRN Meds:.acetaminophen **OR** acetaminophen (TYLENOL) oral liquid 160 mg/5 mL **OR** acetaminophen, albuterol  Antimicrobials: Anti-infectives (From admission, onward)    None        I have personally reviewed the following labs and images: CBC: Recent Labs  Lab 04/03/22 1930 04/04/22 0614  WBC 11.8* 8.5  NEUTROABS 8.1*  --   HGB 11.2* 10.1*  HCT 35.5* 32.1*  MCV 91.3 91.2  PLT 213 183   BMP &GFR Recent Labs  Lab 04/03/22 1930 04/04/22 0614  NA 136  --   K 3.9  --   CL 102  --   CO2 24  --   GLUCOSE 116*  --   BUN 16  --   CREATININE 0.93 1.08*  CALCIUM 9.6  --    Estimated Creatinine Clearance: 40.9 mL/min (A) (by C-G formula based  on SCr of 1.08 mg/dL (H)). Liver & Pancreas: Recent Labs  Lab 04/03/22 1930  AST 15  ALT 12  ALKPHOS 89  BILITOT 0.4  PROT 7.1  ALBUMIN 3.7   No results for input(s): LIPASE, AMYLASE in the last 168 hours. No results for input(s): AMMONIA in the last 168 hours. Diabetic: Recent Labs    04/04/22 0614  HGBA1C 7.6*   Recent Labs  Lab 04/03/22 1923  GLUCAP 123*   Cardiac Enzymes: No results for input(s): CKTOTAL, CKMB, CKMBINDEX, TROPONINI in the last 168 hours. No results for input(s): PROBNP in the last 8760 hours. Coagulation Profile: No results for input(s): INR, PROTIME in the last 168 hours. Thyroid Function Tests: No results for input(s): TSH, T4TOTAL, FREET4, T3FREE, THYROIDAB in the last 72 hours. Lipid Profile: Recent Labs    04/04/22 0614  CHOL 149  HDL 38*  LDLCALC 64  TRIG 236*  CHOLHDL 3.9  Anemia Panel: No results for input(s): VITAMINB12, FOLATE, FERRITIN, TIBC, IRON, RETICCTPCT in the last 72 hours. Urine analysis:    Component Value Date/Time   COLORURINE YELLOW (A) 06/22/2021 2052   APPEARANCEUR CLEAR (A) 06/22/2021 2052   LABSPEC 1.010 06/22/2021 2052   PHURINE 6.0 06/22/2021 2052   GLUCOSEU 50 (A) 06/22/2021 2052   HGBUR NEGATIVE 06/22/2021 2052   BILIRUBINUR NEGATIVE 06/22/2021 2052   KETONESUR 5 (A) 06/22/2021 2052   PROTEINUR NEGATIVE 06/22/2021 2052   NITRITE NEGATIVE 06/22/2021 2052   LEUKOCYTESUR NEGATIVE 06/22/2021 2052   Sepsis Labs: Invalid input(s): PROCALCITONIN, [place]  Microbiology: No results found for this or any previous visit (from the past 240 hour(s)).  Radiology Studies: CT ANGIO HEAD NECK W WO CM  Result Date: 04/04/2022 CLINICAL DATA:  Stroke/TIA, determine embolic source EXAM: CT ANGIOGRAPHY HEAD AND NECK TECHNIQUE: Multidetector CT imaging of the head and neck was performed using the standard protocol during bolus administration of intravenous contrast. Multiplanar CT image reconstructions and MIPs were  obtained to evaluate the vascular anatomy. Carotid stenosis measurements (when applicable) are obtained utilizing NASCET criteria, using the distal internal carotid diameter as the denominator. RADIATION DOSE REDUCTION: This exam was performed according to the departmental dose-optimization program which includes automated exposure control, adjustment of the mA and/or kV according to patient size and/or use of iterative reconstruction technique. CONTRAST:  103m OMNIPAQUE IOHEXOL 350 MG/ML SOLN COMPARISON:  Recent CT and MR imaging, CTA neck August 2022, MRA August 2022 FINDINGS: CT HEAD Brain: There is no acute intracranial hemorrhage, mass effect, or edema. No new loss of gray-white differentiation. Chronic right thalamic infarct. Chronic infarct of the left basal ganglia and adjacent white matter. There is no extra-axial fluid collection. Ventricles and sulci are stable in size and configuration. Vascular: No new finding. Skull: Calvarium is unremarkable. Sinuses/Orbits: No acute finding. Other: None. Review of the MIP images confirms the above findings CTA NECK Aortic arch: Mixed plaque along the arch and great vessel origins. As before, there is moderate to marked stenosis of the right subclavian proximally. Right carotid system: Common carotid remains occluded just beyond the origin. Reconstitution just below the bifurcation. There is mixed plaque extending along the proximal internal carotid with stable less than 50% stenosis. Left carotid system: Patent. Mixed plaque along the common carotid with less than 50% stenosis. Mixed plaque along the proximal internal carotid causing approximately 50% stenosis. Vertebral arteries: Patent. Plaque at the origins causing mild to moderate stenosis. Left vertebral is mildly dominant. Skeleton: Cervical spine degenerative changes are similar in appearance. Other neck: Unremarkable. Upper chest: Refer to same day chest CT Review of the MIP images confirms the above findings  CTA HEAD Anterior circulation: Intracranial internal carotid arteries are patent with calcified plaque. There is moderate stenosis of the supraclinoid right ICA. Posterior circulation: Intracranial vertebral arteries are patent with plaque causing mild stenosis. Basilar artery is patent. Posterior cerebral arteries are patent. A right posterior communicating artery is identified. Venous sinuses: Patent as allowed by contrast bolus timing. Review of the MIP images confirms the above findings IMPRESSION: No acute intracranial abnormality. Unchanged occlusion of the right common carotid just beyond its origin to just proximal to the bifurcation. No new occlusion or hemodynamically significant stenosis. Plaque at the proximal right ICA causes less than 50% stenosis. Plaque at the proximal left ICA causes 50% stenosis. Electronically Signed   By: PMacy MisM.D.   On: 04/04/2022 10:37   DG Chest 2 View  Result Date:  04/03/2022 CLINICAL DATA:  Weakness EXAM: CHEST - 2 VIEW COMPARISON:  Chest x-ray 11/08/2011, CT abdomen pelvis 11/30/2021, CT abdomen pelvis 06/24/2021 FINDINGS: The heart and mediastinal contours are within normal limits. Atherosclerotic plaque. Interval development of patchy airspace opacity of the right lower lung zone. No pulmonary edema. No pleural effusion. No pneumothorax. No acute osseous abnormality. IMPRESSION: 1. Interval development of patchy airspace opacity of the right lower lung zone. Followup PA and lateral chest X-ray is recommended in 3-4 weeks following therapy to ensure resolution and exclude underlying malignancy. 2.  Aortic Atherosclerosis (ICD10-I70.0). Electronically Signed   By: Iven Finn M.D.   On: 04/03/2022 19:58   DG Tibia/Fibula Left  Result Date: 04/03/2022 CLINICAL DATA:  Weakness EXAM: LEFT TIBIA AND FIBULA - 2 VIEW COMPARISON:  None Available. FINDINGS: There is no evidence of fracture or other focal bone lesions. Soft tissues are unremarkable.  Atherosclerotic plaque. Partially visualized screw fixation of the foot. IMPRESSION: No acute displaced fracture or dislocation. Electronically Signed   By: Iven Finn M.D.   On: 04/03/2022 20:00   CT Head Wo Contrast  Result Date: 04/03/2022 CLINICAL DATA:  Blurred vision EXAM: CT HEAD WITHOUT CONTRAST TECHNIQUE: Contiguous axial images were obtained from the base of the skull through the vertex without intravenous contrast. RADIATION DOSE REDUCTION: This exam was performed according to the departmental dose-optimization program which includes automated exposure control, adjustment of the mA and/or kV according to patient size and/or use of iterative reconstruction technique. COMPARISON:  02/26/2022 FINDINGS: Brain: No evidence of acute infarction, hemorrhage, hydrocephalus, extra-axial collection or mass lesion/mass effect. Vascular: Intracranial atherosclerosis. Skull: Normal. Negative for fracture or focal lesion. Sinuses/Orbits: The visualized paranasal sinuses are essentially clear. The mastoid air cells are unopacified. Other: None. IMPRESSION: Normal head CT. Electronically Signed   By: Julian Hy M.D.   On: 04/03/2022 19:36   CT Chest W Contrast  Result Date: 04/04/2022 CLINICAL DATA:  Abnormal chest x-ray EXAM: CT CHEST WITH CONTRAST TECHNIQUE: Multidetector CT imaging of the chest was performed during intravenous contrast administration. RADIATION DOSE REDUCTION: This exam was performed according to the departmental dose-optimization program which includes automated exposure control, adjustment of the mA and/or kV according to patient size and/or use of iterative reconstruction technique. CONTRAST:  74m OMNIPAQUE IOHEXOL 300 MG/ML  SOLN COMPARISON:  Chest x-ray 04/03/2022.  Chest CT 06/22/2021 FINDINGS: Cardiovascular: Diffuse coronary artery and scattered moderate aortic calcifications. Heart is normal size. Aorta is normal caliber. Mediastinum/Nodes: No mediastinal, hilar, or  axillary adenopathy. Trachea and esophagus are unremarkable. Thyroid unremarkable. Lungs/Pleura: No confluent airspace opacities. In particular, no right lower lobe opacity as suggested on x-ray. No pleural effusions. Upper Abdomen: Imaging into the upper abdomen demonstrates no acute findings. Musculoskeletal: Chest wall soft tissues are unremarkable. No acute bony abnormality. IMPRESSION: No acute cardiopulmonary disease.  No right lower consolidation. Diffuse coronary artery disease. Aortic Atherosclerosis (ICD10-I70.0). Electronically Signed   By: KRolm BaptiseM.D.   On: 04/04/2022 01:08   MR BRAIN WO CONTRAST  Result Date: 04/04/2022 CLINICAL DATA:  Initial evaluation for neuro deficit, stroke suspected. EXAM: MRI HEAD WITHOUT CONTRAST TECHNIQUE: Multiplanar, multiecho pulse sequences of the brain and surrounding structures were obtained without intravenous contrast. COMPARISON:  Prior CT from 04/03/2022. FINDINGS: Brain: Examination degraded by motion artifact. Generalized age-related cerebral atrophy. Chronic microvascular ischemic disease noted involving the supratentorial cerebral white matter and pons. Few scattered remote lacunar infarcts present about the right thalamus, left basal ganglia, and left corona radiata. Tiny remote  left cerebellar infarct. Subtle subcentimeter focus of diffusion signal abnormality seen involving the cortical/subcortical aspect of the right frontoparietal region (series 5, images 34, 33), suspicious for a tiny acute to early subacute ischemic infarct. Involvement of the pre and postcentral gyri. No associated hemorrhage or mass effect. No other evidence for acute or subacute ischemia. Gray-white matter differentiation otherwise maintained. No areas of chronic cortical infarction. No visible acute or chronic intracranial blood products. No mass lesion, midline shift or mass effect. No hydrocephalus or extra-axial fluid collection. Vascular: Major intracranial vascular flow  voids are maintained. Skull and upper cervical spine: Craniocervical junction within normal limits. Bone marrow signal intensity normal. No scalp soft tissue abnormality. Sinuses/Orbits: Prior bilateral ocular lens replacement. Paranasal sinuses are largely clear. Trace right mastoid effusion, of doubtful significance. Other: None. IMPRESSION: 1. Subtle subcentimeter focus of diffusion signal abnormality involving the cortical/subcortical aspect of the right frontoparietal region, suspicious for a tiny acute to early subacute ischemic infarct. No associated hemorrhage or mass effect. 2. No other acute intracranial abnormality. 3. Underlying age-related cerebral atrophy with chronic small vessel ischemic disease with multiple remote lacunar infarcts as above. Electronically Signed   By: Jeannine Boga M.D.   On: 04/04/2022 02:49   DG Hip Unilat With Pelvis 2-3 Views Left  Result Date: 04/03/2022 CLINICAL DATA:  Weakness EXAM: DG HIP (WITH OR WITHOUT PELVIS) 2-3V LEFT COMPARISON:  None Available. FINDINGS: Limited evaluation due to overlapping osseous structures and overlying soft tissues. There is no evidence of hip fracture or dislocation of the left hip. No acute displaced fracture or diastasis of the bones of the pelvis. Right hip grossly unremarkable. There is no evidence of arthropathy or other focal bone abnormality. Surgical tacks overlie the pelvis. Atherosclerotic plaque.  Aorto bi-iliac artery stent. IMPRESSION: 1. Negative for acute traumatic injury. 2. Aortic Atherosclerosis (ICD10-I70.0) in a patient status post aorto bi-iliac artery stent. Electronically Signed   By: Iven Finn M.D.   On: 04/03/2022 20:00      Dannah Ryles T. Cobb  If 7PM-7AM, please contact night-coverage [email or web address] 04/04/2022, 4:00 PM

## 2022-04-04 NOTE — Assessment & Plan Note (Addendum)
PVD s/p aorto iliac and SMA stent 06/2021.  She has some left foot erythema with no palpable DP or PT pulse on my exam. Angiograph showed occluded right CCA filling through collaterals, 80 to 90% stenosis of the right subclavian artery, occluded left common iliac artery, patent right common iliac artery stent. -Continue Plavix, aspirin and atorvastatin. -Pain control with as needed Tylenol, tramadol and oxycodone -Encouraged smoking cessation -stenting and angioplasty 04/11/22, follow as directed w/ vascular

## 2022-04-04 NOTE — Assessment & Plan Note (Signed)
Continue oxygen at night.   

## 2022-04-04 NOTE — Assessment & Plan Note (Addendum)
H&H relatively stable.  No report of melena or hematochezia.   Anemia panel suggests iron deficiency. -IV ferric gluconate 250 mg x 1 -Monitor H&H -slightly decreased H/H likely d/t surgery, no bleeding, follow outpatient

## 2022-04-04 NOTE — ED Provider Notes (Addendum)
[facility] Provider Note    Event Date/Time   First MD Initiated Contact with Patient 04/04/22 0033     (approximate)   History   Altered Mental Status   HPI  Autumn Arnold is a 71 y.o. female with a history of CVA in 2011 with no deficits, hypertension, hyperlipidemia, diabetes, anxiety, peripheral vascular disease on Plavix who presents for evaluation of difficulty with her speech.  Patient reports that this evening she was having difficulty finding words and difficulty moving her left arm due to numbness and weakness.  The episode lasted a couple of hours but has now resolved.  She also reports blurry vision on the right eye.  She denies headache.  According to her sister patient was found on the floor of her house 2 nights in a row.  She usually sleeps in the recliner.  It is unclear if she had a fall or how she ended up on the floor.  She denies headache, there was no facial droop, no weakness of the lower extremities.  Patient does have chronic left lower extremity pain from peripheral vascular disease.  Patient is status post angioplasty and stent placement in the left common iliac in August 2022.  She has had chronic pain since then which has been worse over the last several weeks. She has an appointment with vascular in the morning.       Past Medical History:  Diagnosis Date   Anxiety    Arthritis    Asthma    Bilateral lower extremity edema    COPD (chronic obstructive pulmonary disease) (HCC)    Cough    CHRONIC   Diabetes mellitus 2008   Edema    LEGS/FEET   GERD (gastroesophageal reflux disease)    Hyperlipidemia    Hypertension    Shortness of breath dyspnea    DOE   Sleep apnea    CPAP at [facility], Dr. Humphrey Rolls   Stroke [facility]) 02/2010   headache, left arm numbness   Wheezing     Past Surgical History:  Procedure Laterality Date   ABDOMINAL HYSTERECTOMY     ANTERIOR FUSION CERVICAL SPINE     CARPAL TUNNEL RELEASE     Bilateral    CATARACT EXTRACTION W/PHACO Left 03/12/2016   Procedure: CATARACT EXTRACTION PHACO AND INTRAOCULAR LENS PLACEMENT ([place]);  Surgeon: Estill Cotta, MD;  Location: [facility] ORS;  Service: Ophthalmology;  Laterality: Left;  [place] 01:12 AP% 23.4 CDE 29.83 fluid pack lot # [id number]   CATARACT EXTRACTION W/PHACO Right 04/16/2016   Procedure: CATARACT EXTRACTION PHACO AND INTRAOCULAR LENS PLACEMENT (IOC);  Surgeon: Estill Cotta, MD;  Location: [facility] ORS;  Service: Ophthalmology;  Laterality: Right;  [place] 01:17 AP% 22.5 CDE 35.08 Fluid pack lot # [id number]   CESAREAN SECTION     COLONOSCOPY WITH PROPOFOL N/A 08/12/2018   Procedure: COLONOSCOPY WITH PROPOFOL;  Surgeon: Toledo, Benay Pike, MD;  Location: [facility] ENDOSCOPY;  Service: Gastroenterology;  Laterality: N/A;   ELBOW SURGERY     Tendonitis   ESOPHAGOGASTRODUODENOSCOPY (EGD) WITH PROPOFOL N/A 08/12/2018   Procedure: ESOPHAGOGASTRODUODENOSCOPY (EGD) WITH PROPOFOL;  Surgeon: Toledo, Benay Pike, MD;  Location: [facility] ENDOSCOPY;  Service: Gastroenterology;  Laterality: N/A;   INCONTINENCE SURGERY     LOWER EXTREMITY ANGIOGRAPHY Left 06/23/2021   Procedure: Lower Extremity Angiography;  Surgeon: Katha Cabal, MD;  Location: [place] [facility];  Service: Cardiovascular;  Laterality: Left;   TONSILLECTOMY       Physical Exam  Triage Vital Signs: ED Triage Vitals  Enc Vitals Group     BP 04/03/22 1911 (!) 70/48     Pulse Rate 04/03/22 1911 (!) 111     Resp 04/03/22 1923 20     Temp 04/03/22 1923 97.9 F (36.6 C)     Temp Source 04/03/22 1923 Oral     SpO2 04/03/22 1923 100 %     Weight 04/03/22 1929 140 lb (63.5 kg)     Height --      Head Circumference --      Peak Flow --      Pain Score 04/03/22 1929 5     Pain Loc --      Pain Edu? --      Excl. in [place]? --     Most recent vital signs: Vitals:   04/03/22 1923 04/04/22 0041  BP: (!) 115/55 (!) 110/51  Pulse: 75 73  Resp: 20 (!) 24  Temp: 97.9 F (36.6 C)   SpO2: 100% 100%      Constitutional: Alert and oriented. Well appearing and in no apparent distress. HEENT:      Head: Normocephalic and atraumatic.         Eyes: Conjunctivae are normal. Sclera is non-icteric.       Mouth/Throat: Mucous membranes are moist.       Neck: Supple with no signs of meningismus. Cardiovascular: Regular rate and rhythm. No murmurs, gallops, or rubs. 2+ symmetrical distal pulses are present in all extremities.  Respiratory: Normal respiratory effort. Lungs are clear to auscultation bilaterally.  Gastrointestinal: Soft, non tender, and non distended with positive bowel sounds. No rebound or guarding. Genitourinary: No CVA tenderness. Musculoskeletal:  No palpable or dopplerable DP and PT pulses on b/l feet. Palpable popliteal and femoral pulses bilaterally.  Both feet are cool to the touch, right foot has normal cap refill, left foot has delayed cap refill greater than 5 seconds and slightly pale. Neurologic: Normal speech and language. Face is symmetric.  Intact strength and sensation x4, no pronator drift, no dysmetria. skin: Skin is warm, dry and intact. No rash noted. Psychiatric: Mood and affect are normal. Speech and behavior are normal.  ED Results / Procedures / Treatments   Labs (all labs ordered are listed, but only abnormal results are displayed) Labs Reviewed  CBC WITH DIFFERENTIAL/PLATELET - Abnormal; Notable for the following components:      Result Value   WBC 11.8 (*)    Hemoglobin 11.2 (*)    HCT 35.5 (*)    RDW 15.6 (*)    Neutro Abs 8.1 (*)    All other components within normal limits  COMPREHENSIVE METABOLIC PANEL - Abnormal; Notable for the following components:   Glucose, Bld 116 (*)    All other components within normal limits  CBG MONITORING, ED - Abnormal; Notable for the following components:   Glucose-Capillary 123 (*)    All other components within normal limits  URINALYSIS, ROUTINE W REFLEX MICROSCOPIC     EKG  ED ECG REPORT I,  Rudene Re, the attending physician, personally viewed and interpreted this ECG.  Normal sinus rhythm with a rate of 80, normal intervals, normal axis, no ST elevations or depressions  RADIOLOGY I, Rudene Re, attending MD, have personally viewed and interpreted the images obtained during this visit as below:  CT head negative for acute pathology  Chest x-ray was concerning to radiology for a possible right lower quadrant infiltrate  CT of the chest is unremarkable  X-ray of the left lower extremity with no acute findings   ___________________________________________________ Interpretation by Radiologist:  DG Chest 2 View  Result Date: 04/03/2022 CLINICAL DATA:  Weakness EXAM: CHEST - 2 VIEW COMPARISON:  Chest x-ray 11/08/2011, CT abdomen pelvis 11/30/2021, CT abdomen pelvis 06/24/2021 FINDINGS: The heart and mediastinal contours are within normal limits. Atherosclerotic plaque. Interval development of patchy airspace opacity of the right lower lung zone. No pulmonary edema. No pleural effusion. No pneumothorax. No acute osseous abnormality. IMPRESSION: 1. Interval development of patchy airspace opacity of the right lower lung zone. Followup PA and lateral chest X-ray is recommended in 3-4 weeks following therapy to ensure resolution and exclude underlying malignancy. 2.  Aortic Atherosclerosis (ICD10-I70.0). Electronically Signed   By: Iven Finn M.D.   On: 04/03/2022 19:58   DG Tibia/Fibula Left  Result Date: 04/03/2022 CLINICAL DATA:  Weakness EXAM: LEFT TIBIA AND FIBULA - 2 VIEW COMPARISON:  None Available. FINDINGS: There is no evidence of fracture or other focal bone lesions. Soft tissues are unremarkable. Atherosclerotic plaque. Partially visualized screw fixation of the foot. IMPRESSION: No acute displaced fracture or dislocation. Electronically Signed   By: Iven Finn M.D.   On: 04/03/2022 20:00   CT Head Wo Contrast  Result Date: 04/03/2022 CLINICAL DATA:   Blurred vision EXAM: CT HEAD WITHOUT CONTRAST TECHNIQUE: Contiguous axial images were obtained from the base of the skull through the vertex without intravenous contrast. RADIATION DOSE REDUCTION: This exam was performed according to the departmental dose-optimization program which includes automated exposure control, adjustment of the mA and/or kV according to patient size and/or use of iterative reconstruction technique. COMPARISON:  02/26/2022 FINDINGS: Brain: No evidence of acute infarction, hemorrhage, hydrocephalus, extra-axial collection or mass lesion/mass effect. Vascular: Intracranial atherosclerosis. Skull: Normal. Negative for fracture or focal lesion. Sinuses/Orbits: The visualized paranasal sinuses are essentially clear. The mastoid air cells are unopacified. Other: None. IMPRESSION: Normal head CT. Electronically Signed   By: Julian Hy M.D.   On: 04/03/2022 19:36   CT Chest W Contrast  Result Date: 04/04/2022 CLINICAL DATA:  Abnormal chest x-ray EXAM: CT CHEST WITH CONTRAST TECHNIQUE: Multidetector CT imaging of the chest was performed during intravenous contrast administration. RADIATION DOSE REDUCTION: This exam was performed according to the departmental dose-optimization program which includes automated exposure control, adjustment of the mA and/or kV according to patient size and/or use of iterative reconstruction technique. CONTRAST:  50m OMNIPAQUE IOHEXOL 300 MG/ML  SOLN COMPARISON:  Chest x-ray 04/03/2022.  Chest CT 06/22/2021 FINDINGS: Cardiovascular: Diffuse coronary artery and scattered moderate aortic calcifications. Heart is normal size. Aorta is normal caliber. Mediastinum/Nodes: No mediastinal, hilar, or axillary adenopathy. Trachea and esophagus are unremarkable. Thyroid unremarkable. Lungs/Pleura: No confluent airspace opacities. In particular, no right lower lobe opacity as suggested on x-ray. No pleural effusions. Upper Abdomen: Imaging into the upper abdomen  demonstrates no acute findings. Musculoskeletal: Chest wall soft tissues are unremarkable. No acute bony abnormality. IMPRESSION: No acute cardiopulmonary disease.  No right lower consolidation. Diffuse coronary artery disease. Aortic Atherosclerosis (ICD10-I70.0). Electronically Signed   By: KRolm BaptiseM.D.   On: 04/04/2022 01:08   MR BRAIN WO CONTRAST  Result Date: 04/04/2022 CLINICAL DATA:  Initial evaluation for neuro deficit, stroke suspected. EXAM: MRI HEAD WITHOUT CONTRAST TECHNIQUE: Multiplanar, multiecho pulse sequences of the brain and surrounding structures were obtained without intravenous contrast. COMPARISON:  Prior CT from 04/03/2022. FINDINGS: Brain: Examination degraded by motion artifact. Generalized age-related cerebral atrophy. Chronic microvascular ischemic disease noted involving the supratentorial  cerebral white matter and pons. Few scattered remote lacunar infarcts present about the right thalamus, left basal ganglia, and left corona radiata. Tiny remote left cerebellar infarct. Subtle subcentimeter focus of diffusion signal abnormality seen involving the cortical/subcortical aspect of the right frontoparietal region (series 5, images 34, 33), suspicious for a tiny acute to early subacute ischemic infarct. Involvement of the pre and postcentral gyri. No associated hemorrhage or mass effect. No other evidence for acute or subacute ischemia. Gray-white matter differentiation otherwise maintained. No areas of chronic cortical infarction. No visible acute or chronic intracranial blood products. No mass lesion, midline shift or mass effect. No hydrocephalus or extra-axial fluid collection. Vascular: Major intracranial vascular flow voids are maintained. Skull and upper cervical spine: Craniocervical junction within normal limits. Bone marrow signal intensity normal. No scalp soft tissue abnormality. Sinuses/Orbits: Prior bilateral ocular lens replacement. Paranasal sinuses are largely clear.  Trace right mastoid effusion, of doubtful significance. Other: None. IMPRESSION: 1. Subtle subcentimeter focus of diffusion signal abnormality involving the cortical/subcortical aspect of the right frontoparietal region, suspicious for a tiny acute to early subacute ischemic infarct. No associated hemorrhage or mass effect. 2. No other acute intracranial abnormality. 3. Underlying age-related cerebral atrophy with chronic small vessel ischemic disease with multiple remote lacunar infarcts as above. Electronically Signed   By: Jeannine Boga M.D.   On: 04/04/2022 02:49   DG Hip Unilat With Pelvis 2-3 Views Left  Result Date: 04/03/2022 CLINICAL DATA:  Weakness EXAM: DG HIP (WITH OR WITHOUT PELVIS) 2-3V LEFT COMPARISON:  None Available. FINDINGS: Limited evaluation due to overlapping osseous structures and overlying soft tissues. There is no evidence of hip fracture or dislocation of the left hip. No acute displaced fracture or diastasis of the bones of the pelvis. Right hip grossly unremarkable. There is no evidence of arthropathy or other focal bone abnormality. Surgical tacks overlie the pelvis. Atherosclerotic plaque.  Aorto bi-iliac artery stent. IMPRESSION: 1. Negative for acute traumatic injury. 2. Aortic Atherosclerosis (ICD10-I70.0) in a patient status post aorto bi-iliac artery stent. Electronically Signed   By: Iven Finn M.D.   On: 04/03/2022 20:00       PROCEDURES:  Critical Care performed: yes  .Critical Care Performed by: Rudene Re, MD Authorized by: Rudene Re, MD   Critical care provider statement:    Critical care time (minutes):  30   Critical care time was exclusive of:  Separately billable procedures and treating other patients   Critical care was necessary to treat or prevent imminent or life-threatening deterioration of the following conditions:  Shock, CNS failure or compromise and circulatory failure   Critical care was time spent personally by  me on the following activities:  Development of treatment plan with patient or surrogate, discussions with consultants, evaluation of patient's response to treatment, examination of patient, ordering and review of laboratory studies, ordering and review of radiographic studies, ordering and performing treatments and interventions, pulse oximetry, re-evaluation of patient's condition and review of old charts   I assumed direction of critical care for this patient from another provider in my specialty: no     Care discussed with: admitting provider      IMPRESSION / MDM / [place] / ED COURSE  I reviewed the triage vital signs and the nursing notes.  71 y.o. female with a history of CVA in 2011 with no deficits, hypertension, hyperlipidemia, diabetes, anxiety, peripheral vascular disease on Plavix who presents for evaluation of difficulty with her speech and left upper extremity  weakness and numbness.  Symptoms have now resolved.  Patient also complaining of progressively worsening left lower extremity pain which has been ongoing for several weeks.  On exam she is currently neurologically intact.  She does have decreased cap refill and no palpable or dopplerable PT or DP pulses on LLE.  She also has no palpable or dopplerable PT and DP pulses on the right but her cap refill was normal on that foot.  She has palpable popliteal pulses on both extremities.  According to her and her sister who are at bedside the paleness of the foot is been chronic for several weeks.  Ddx: TIA versus CVA versus reactivation of old stroke symptoms.  Also concerning for worsening peripheral vascular disease.   Plan: EKG, CT of the head, MRI of CT negative, basic labs.  Patient placed on telemetry for monitoring of cardiorespiratory status.   MEDICATIONS GIVEN IN ED: Medications  sodium chloride 0.9 % bolus 1,000 mL (1,000 mLs Intravenous New Bag/Given 04/04/22 0215)  iohexol (OMNIPAQUE) 300 MG/ML solution 75 mL  (75 mLs Intravenous Contrast Given 04/04/22 0055)  oxyCODONE-acetaminophen (PERCOCET/ROXICET) 5-325 MG per tablet 1 tablet (1 tablet Oral Given 04/04/22 0215)     ED COURSE: CT of the head negative for stroke.  MRI is pending.  Chest x-ray initially concerning for possible airspace opacity on the right lower lobe.  Patient with no signs of pneumonia therefore CT was done and that is negative for any acute pathology in the lung.  EKG with no signs of ischemia or dysrhythmias.  We will start patient on heparin for her feet and consult hospitalist for admission.  After discussion patient has been accepted to their service.  I have reviewed the note from patient's visit with her primary care doctor from a week ago and it seems like the exam of her foot at that time was similar to my exam currently.  _________________________ 2:59 AM on 04/04/2022 ----------------------------------------- MRI is positive for an acute stroke.  We will hold off heparin at this time due to concerns for possible hemorrhagic conversion especially since exam of her foot seems to be stable   Consults: Hospitalist   EMR reviewed including records from her last visit with vascular from October 2022    FINAL CLINICAL IMPRESSION(S) / ED DIAGNOSES   Final diagnoses:  PAD (peripheral artery disease) ([place])  Cerebrovascular accident (CVA), unspecified mechanism ([place])     Rx / DC Orders   ED Discharge Orders     None        Note:  This document was prepared using Dragon voice recognition software and may include unintentional dictation errors.   Please note:  Patient was evaluated in Emergency Department today for the symptoms described in the history of present illness. Patient was evaluated in the context of the global COVID-19 pandemic, which necessitated consideration that the patient might be at risk for infection with the SARS-CoV-2 virus that causes COVID-19. Institutional protocols and algorithms that  pertain to the evaluation of patients at risk for COVID-19 are in a state of rapid change based on information released by regulatory bodies including the CDC and federal and state organizations. These policies and algorithms were followed during the patient's care in the ED.  Some ED evaluations and interventions may be delayed as a result of limited staffing during the pandemic.       Alfred Levins, [place], MD 04/04/22 [place], [place], MD 04/04/22 [place], [place], MD 04/04/22  0259  

## 2022-04-04 NOTE — Assessment & Plan Note (Signed)
Nicotine patch and tobacco cessation counseling

## 2022-04-04 NOTE — ED Notes (Signed)
Pt sent to CT

## 2022-04-04 NOTE — Assessment & Plan Note (Addendum)
Stable.  Over 60-pack-year history.  Currently smokes about a pack a day.  Discussed the importance of smoking cessation.  -Nicotine patch as needed

## 2022-04-04 NOTE — Progress Notes (Signed)
SLP Cancellation Note  Patient Details Name: Autumn Arnold MRN: [id number] DOB: 25-Dec-1950   Cancelled treatment:       Reason Eval/Treat Not Completed: SLP screened, no needs identified, will sign off (chart reviewed; consulted NSG then met w/ pt in her room during breakfast meal) Pt denied any difficulty swallowing and is currently on a regular diet; tolerates swallowing pills w/ water per NSG report. [facility] swallow screen passed last shift. Pt consumed most of breakfast tray during this visit w/ No overt s/s of aspiration noted.  Pt conversed in conversation re: her children/family/herself w/ No New expressive/receptive deficits nor fluency changes noted; pt denied any New speech-language deficits. Speech intelligible in general conversation. She endorsed mild changes in her speech last night, but stated "it sounds like my usual speech at home" currently. Pt does have baseline speech-language deficits s/p previous CVA per chart/husband/pt report -- "left frontal and parietal CVA 06/2021 with mild cognitive deficits". Also, pt conversed w/ Vascular who arrived to room answering all questions adequately. No further skilled ST services indicated as pt appears at her baseline. Recommended to pt to f/u w/ PCP for referral to outpatient ST services if she felt a need to once back in home setting performing her ADLs. Pt agreed. NSG updated and will reconsult if any new change in status while admitted.         Orinda Kenner, MS, CCC-SLP Speech Language Pathologist Rehab Services; [place] [phone] (ascom) Addalyn Speedy 04/04/2022, 8:27 AM

## 2022-04-04 NOTE — Evaluation (Signed)
Physical Therapy Evaluation Patient Details Name: Autumn Arnold MRN: [id number] DOB: [birth year] Today's Date: 04/04/2022  History of Present Illness  71 y.o. 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  She also had numbness and weakness of the left arm - imaging revealed small acute/sub-acute CVA.  Clinical Impression  Pt reports symptoms have largely resolved to her baseline, but still with some mild L side weakness/coordination issues along with generally feeling weaker than normal.  She ultimately did quite well with mobility tasks and was ab;e to ambulate 175 ft with safe and confident cadence and only minimal c/o fatigue.  She had decreased stance time on L but even with light HHA was able to maintain nearly communitty appropriate speed and cadence.  Good effort, son present and reports that family and friends can be around initially to help with transition back home.  Pt feels good about going home with their assist and HHPT, is safe to do so from PT perspective.  Encouraged AD (walker then [facility]) initially as she transitions home.      Recommendations for follow up therapy are one component of a multi-disciplinary discharge planning process, led by the attending physician.  Recommendations may be updated based on patient status, additional functional criteria and insurance authorization.  Follow Up Recommendations Home health PT    Assistance Recommended at Discharge PRN  Patient can return home with the following       Equipment Recommendations None recommended by PT   Recommendations for Other Services       Functional Status Assessment Patient has had a recent decline in their functional status and demonstrates the ability to make significant improvements in function in a reasonable and predictable amount of time.     Precautions / Restrictions Precautions Precautions: Fall Restrictions Weight Bearing Restrictions: No      Mobility  Bed Mobility Overal bed mobility: Modified Independent                  Transfers Overall transfer level: Modified independent Equipment used: Rolling walker (2 wheels)               General transfer comment: Pt able to rise to standing safely and with relative ease, minimal reliance on walker    Ambulation/Gait Ambulation/Gait assistance: Supervision Gait Distance (Feet): 175 Feet Assistive device: Rolling walker (2 wheels), 1 person hand held assist         General Gait Details: Pt able to assume safe, consistent and confident cadence, first with walker (~100 ft) and then with light single UE HHA (~75 ft).  She had some minimal foot drag that she reports as near baseline - ultimately she is safe and not far from her baseline.  Stairs            Wheelchair Mobility    Modified Rankin (Stroke Patients Only)       Balance Overall balance assessment: Modified Independent  Pertinent Vitals/Pain Pain Assessment Pain Assessment: 0-10 Pain Score: 4  Pain Location: L foot, reports she dropped something on it when symptoms started, also has some circulatory issues (though not cold today)    Home Living Family/patient expects to be discharged to:: Private residence Living Arrangements: Spouse/significant other Available Help at Discharge: Family;Friend(s);Available PRN/intermittently Type of Home: House Home Access: Stairs to enter Entrance Stairs-Rails:  (column) Entrance Stairs-Number of Steps: 2   Home Layout: One  level Home Equipment: Rollator (4 wheels);Rolling Walker (2 wheels);Cane - single point      Prior Function Prior Level of Function : Independent/Modified Independent             Mobility Comments: Pt reports that she drives and gets out of the home, able to stay relatively active       Hand Dominance        Extremity/Trunk Assessment   Upper Extremity Assessment Upper Extremity Assessment: Overall WFL for tasks assessed (R grossly 4/5, L 4-/5)    Lower Extremity Assessment Lower Extremity Assessment: Generalized weakness (R grossly 4/5, L 4-/5)       Communication   Communication: No difficulties  Cognition Arousal/Alertness: Awake/alert Behavior During Therapy: WFL for tasks assessed/performed Overall Cognitive Status: Within Functional Limits for tasks assessed                                          General Comments General comments (skin integrity, edema, etc.): Pt with some minimal L UE weakness but displayed near baseline functional strength and coordination.    Exercises     Assessment/Plan    PT Assessment Patient needs continued PT services  PT Problem List Decreased strength;Decreased range of motion;Decreased activity tolerance;Decreased balance;Decreased mobility;Decreased coordination;Decreased safety awareness;Decreased knowledge of use of DME;Decreased knowledge of precautions       PT Treatment Interventions DME instruction;Gait training;Stair training;Functional mobility training;Therapeutic activities;Therapeutic exercise;Balance training;Neuromuscular re-education;Patient/family education    PT Goals (Current goals can be found in the Care Plan section)  Acute Rehab PT Goals Patient Stated Goal: go home PT Goal Formulation: With patient Time For Goal Achievement: 04/18/22 Potential to Achieve Goals: Good    Frequency Min 2X/week     Co-evaluation               AM-PAC PT "6 Clicks" Mobility  Outcome Measure  Help needed turning from your back to your side while in a flat bed without using bedrails?: None Help needed moving from lying on your back to sitting on the side of a flat bed without using bedrails?: None Help needed moving to and from a bed to a chair (including a wheelchair)?: None Help needed standing up from a chair using your arms (e.g., wheelchair or bedside chair)?: None Help needed to walk in hospital room?: A Little Help needed climbing 3-5 steps with a railing? : A Little 6 Click Score: 22    End of Session Equipment Utilized During Treatment: Gait belt Activity Tolerance: Patient tolerated treatment well;Patient limited by fatigue Patient left: in bed;with family/visitor present;with call bell/phone within reach Nurse Communication: Mobility status PT Visit Diagnosis: Muscle weakness (generalized) (M62.81);Difficulty in walking, not elsewhere classified (R26.2);Unsteadiness on feet (R26.81);Other symptoms and signs involving the nervous system (R29.898)    Time: 1040-1103 PT Time Calculation (min) (ACUTE ONLY): 23 min   Charges:   PT Evaluation $PT Eval Low Complexity: 1 Low PT  Treatments $Gait Training: 8-22 mins        Kreg Shropshire, DPT 04/04/2022, 1:42 PM

## 2022-04-04 NOTE — Assessment & Plan Note (Addendum)
A1c 7.6%. Recent Labs  Lab 04/07/22 1156 04/07/22 1541 04/07/22 2154 04/08/22 0811 04/08/22 1230  GLUCAP 146* 194* 129* 128* 88  Continue SSI and statin

## 2022-04-04 NOTE — Assessment & Plan Note (Addendum)
BP lowest was 70/48.  Resolved. -Continue reduced dose of metoprolol -have d/c IV fluid, pt tolerating po and BP have improved

## 2022-04-04 NOTE — Consult Note (Signed)
[facility] Vascular Consult Note  MRN : [id number]  Autumn Arnold is a 72 y.o. (1950-12-19) female who presents with chief complaint of  Chief Complaint  Patient presents with   Altered Mental Status  .   Consulting Physician: Judd Gaudier, MD Reason for consult: Leg Ischemia  History of Present Illness: Autumn Arnold is a 71 year old female with a history of peripheral arterial disease, carotid stenosis and superior mesenteric artery stenosis.  She additionally has a past medical history of diabetes mellitus, previous CVA in 2022.  The patient was originally scheduled to be seen in our office today for follow-up of her peripheral arterial disease.  She was seen by her PCP on 03/27/2022 and noted to have a cool discolored foot.  She presented to [facility] following concerns of a stroke.  She had altered mental status, blurred vision and difficulty with finding words.  She also had numbness weakness of her left arm.  It was also noted that the past 2 nights the patient woke up on the floor beside her recliner.  She notes that she has been sleeping and required for months that she has been having pain in her left lower extremity for the last 3 months.  The pain was worse anytime she would lay down in bed, which is consistent for rest pain.  Previous review of her noninvasive studies show an ABI of 0.51 in the left lower extremity on 07/27/2021.  She has had previous vascular intervention on 06/23/2021 with angioplasty and stent placement of the SMA with additional angioplasty and stent placement to the infrarenal aorta with bilateral common iliac stents.  No evidence of atrial fibrillation on the monitor.  CT chest shows no evidence of thoracic aortic aneurysm.  The patient also underwent a CT angio neck which was independently reviewed by myself which notes a 65 to 70% stenosis on the left ICA with an occlusion of the right common carotid artery.  However  it appears that the ICA is being filled by collaterals from the external carotid artery.  Right ICA has approximately an 80% stenosis.  Current Facility-Administered Medications  Medication Dose Route Frequency Provider Last Rate Last Admin    stroke: early stages of recovery book   Does not apply Once Athena Masse, MD       0.9 %  sodium chloride infusion   Intravenous Continuous Athena Masse, MD 100 mL/hr at 04/04/22 0504 New Bag at 04/04/22 0504   acetaminophen (TYLENOL) tablet 650 mg  650 mg Oral Q4H PRN Athena Masse, MD   650 mg at 04/04/22 9702   Or   acetaminophen (TYLENOL) 160 MG/5ML solution 650 mg  650 mg Per Tube Q4H PRN Athena Masse, MD       Or   acetaminophen (TYLENOL) suppository 650 mg  650 mg Rectal Q4H PRN Athena Masse, MD       albuterol (PROVENTIL) (2.5 MG/3ML) 0.083% nebulizer solution 2.5 mg  2.5 mg Nebulization Q4H PRN Renda Rolls, RPH       aspirin EC tablet 81 mg  81 mg Oral Daily Judd Gaudier V, MD   81 mg at 04/04/22 0900   atorvastatin (LIPITOR) tablet 80 mg  80 mg Oral QPM Athena Masse, MD       busPIRone (BUSPAR) tablet 5 mg  5 mg Oral BID Judd Gaudier V, MD   5 mg at 04/04/22 0900   clopidogrel (PLAVIX) tablet 75 mg  75 mg Oral Daily Judd Gaudier V, MD   75 mg at 04/04/22 0900   enoxaparin (LOVENOX) injection 40 mg  40 mg Subcutaneous Q24H Judd Gaudier V, MD   40 mg at 04/04/22 0856   metoprolol tartrate (LOPRESSOR) tablet 50 mg  50 mg Oral BID Athena Masse, MD   50 mg at 04/04/22 0900   nicotine (NICODERM CQ - dosed in mg/24 hours) patch 21 mg  21 mg Transdermal Daily Athena Masse, MD       pantoprazole (PROTONIX) EC tablet 40 mg  40 mg Oral Daily Judd Gaudier V, MD   40 mg at 04/04/22 0900   sertraline (ZOLOFT) tablet 150 mg  150 mg Oral Daily Athena Masse, MD   150 mg at 04/04/22 0900   Current Outpatient Medications  Medication Sig Dispense Refill   alendronate (FOSAMAX) 70 MG tablet Take 70 mg by mouth once a week. Take  with a full glass of water on an empty stomach.     vitamin B-12 (CYANOCOBALAMIN) 1000 MCG tablet Take 1,000 mcg by mouth daily. Reported on 12/20/2015     albuterol (VENTOLIN HFA) 108 (90 Base) MCG/ACT inhaler Inhale 2 puffs into the lungs as needed.     amitriptyline (ELAVIL) 150 MG tablet Take by mouth daily. Reported on 12/20/2015     aspirin EC 81 MG EC tablet Take 1 tablet (81 mg total) by mouth daily. 30 tablet 2   atorvastatin (LIPITOR) 80 MG tablet Take 80 mg by mouth daily.     busPIRone (BUSPAR) 5 MG tablet Take 5 mg by mouth 2 (two) times daily.     Calcium Carb-Cholecalciferol 600-400 MG-UNIT TABS Take 1 tablet by mouth 2 (two) times daily.     clopidogrel (PLAVIX) 75 MG tablet Take 1 tablet (75 mg total) by mouth daily. (Patient not taking: Reported on 02/26/2022) 30 tablet 2   gabapentin (NEURONTIN) 600 MG tablet Take 1,200 mg by mouth 2 (two) times daily.     HYDROcodone-acetaminophen (NORCO/VICODIN) 5-325 MG tablet Take 1-2 tablets by mouth every 8 (eight) hours as needed for moderate pain or severe pain. (Patient not taking: Reported on 02/26/2022) 10 tablet 0   metFORMIN (GLUCOPHAGE) 1000 MG tablet Take 1,000 mg by mouth 2 (two) times daily.     metoprolol tartrate (LOPRESSOR) 50 MG tablet Take 50 mg by mouth 2 (two) times daily.     montelukast (SINGULAIR) 10 MG tablet Take 10 mg by mouth daily.     Multiple Vitamin (MULTIVITAMIN WITH MINERALS) TABS tablet Take 1 tablet by mouth daily. 30 tablet 0   nicotine (NICODERM CQ - DOSED IN MG/24 HOURS) 21 mg/24hr patch Place 1 patch (21 mg total) onto the skin daily. (Patient not taking: Reported on 02/26/2022) 28 patch 0   pantoprazole (PROTONIX) 40 MG tablet Take 40 mg by mouth daily.     sertraline (ZOLOFT) 50 MG tablet Take 3 tablets (150 mg total) by mouth daily. 90 tablet 0   tiZANidine (ZANAFLEX) 4 MG tablet Take 4 mg by mouth 2 (two) times daily.      Past Medical History:  Diagnosis Date   Anxiety    Arthritis    Asthma     Bilateral lower extremity edema    COPD (chronic obstructive pulmonary disease) (HCC)    Cough    CHRONIC   Diabetes mellitus 2008   Edema    LEGS/FEET   GERD (gastroesophageal reflux disease)    Hyperlipidemia  Hypertension    Shortness of breath dyspnea    DOE   Sleep apnea    CPAP at [facility], Dr. Humphrey Rolls   Stroke [facility]) 02/2010   headache, left arm numbness   Wheezing     Past Surgical History:  Procedure Laterality Date   ABDOMINAL HYSTERECTOMY     ANTERIOR FUSION CERVICAL SPINE     CARPAL TUNNEL RELEASE     Bilateral   CATARACT EXTRACTION W/PHACO Left 03/12/2016   Procedure: CATARACT EXTRACTION PHACO AND INTRAOCULAR LENS PLACEMENT ([place]);  Surgeon: Estill Cotta, MD;  Location: [facility] ORS;  Service: Ophthalmology;  Laterality: Left;  [place] 01:12 AP% 23.4 CDE 29.83 fluid pack lot # [id number]   CATARACT EXTRACTION W/PHACO Right 04/16/2016   Procedure: CATARACT EXTRACTION PHACO AND INTRAOCULAR LENS PLACEMENT ([facility]);  Surgeon: Estill Cotta, MD;  Location: [facility] ORS;  Service: Ophthalmology;  Laterality: Right;  [place] 01:17 AP% 22.5 CDE 35.08 Fluid pack lot # [id number]   CESAREAN SECTION     COLONOSCOPY WITH PROPOFOL N/A 08/12/2018   Procedure: COLONOSCOPY WITH PROPOFOL;  Surgeon: Toledo, Benay Pike, MD;  Location: [facility] ENDOSCOPY;  Service: Gastroenterology;  Laterality: N/A;   ELBOW SURGERY     Tendonitis   ESOPHAGOGASTRODUODENOSCOPY (EGD) WITH PROPOFOL N/A 08/12/2018   Procedure: ESOPHAGOGASTRODUODENOSCOPY (EGD) WITH PROPOFOL;  Surgeon: Toledo, Benay Pike, MD;  Location: [facility] ENDOSCOPY;  Service: Gastroenterology;  Laterality: N/A;   INCONTINENCE SURGERY     LOWER EXTREMITY ANGIOGRAPHY Left 06/23/2021   Procedure: Lower Extremity Angiography;  Surgeon: Katha Cabal, MD;  Location: [facility];  Service: Cardiovascular;  Laterality: Left;   TONSILLECTOMY      Social History Social History   Tobacco Use   Smoking status: Every Day    Packs/day: 1.00    Types:  Cigarettes   Smokeless tobacco: Never  Substance Use Topics   Alcohol use: Yes    Alcohol/week: 7.0 standard drinks    Types: 7 Cans of beer per week   Drug use: No    Family History Family History  Problem Relation Age of Onset   CAD Mother    Hypertension Mother    Arthritis Mother    Diabetes Father    CAD Father     Allergies  Allergen Reactions   Lisinopril Other (See Comments)    Tongue burning sensation   Plavix [Clopidogrel] Other (See Comments)     REVIEW OF SYSTEMS (Negative unless checked)  Constitutional: '[]'$ Weight loss  '[]'$ Fever  '[]'$ Chills Cardiac: '[]'$ Chest pain   '[]'$ Chest pressure   '[]'$ Palpitations   '[]'$ Shortness of breath when laying flat   '[]'$ Shortness of breath at rest   '[]'$ Shortness of breath with exertion. Vascular:  '[]'$ Pain in legs with walking   '[]'$ Pain in legs at rest   '[]'$ Pain in legs when laying flat   '[]'$ Claudication   '[]'$ Pain in feet when walking  '[]'$ Pain in feet at rest  '[]'$ Pain in feet when laying flat   '[]'$ History of DVT   '[]'$ Phlebitis   '[]'$ Swelling in legs   '[]'$ Varicose veins   '[]'$ Non-healing ulcers Pulmonary:   '[]'$ Uses home oxygen   '[]'$ Productive cough   '[]'$ Hemoptysis   '[]'$ Wheeze  '[]'$ COPD   '[]'$ Asthma Neurologic:  '[]'$ Dizziness  '[]'$ Blackouts   '[]'$ Seizures   '[x]'$ History of stroke   '[]'$ History of TIA  '[]'$ Aphasia   '[]'$ Temporary blindness   '[]'$ Dysphagia   '[]'$ Weakness or numbness in arms   '[x]'$ Weakness or numbness in legs Musculoskeletal:  '[]'$ Arthritis   '[]'$ Joint swelling   '[]'$ Joint pain   '[]'$   Low back pain Hematologic:  '[]'$ Easy bruising  '[]'$ Easy bleeding   '[]'$ Hypercoagulable state   '[]'$ Anemic  '[]'$ Hepatitis Gastrointestinal:  '[]'$ Blood in stool   '[]'$ Vomiting blood  '[]'$ Gastroesophageal reflux/heartburn   '[]'$ Difficulty swallowing. Genitourinary:  '[]'$ Chronic kidney disease   '[]'$ Difficult urination  '[]'$ Frequent urination  '[]'$ Burning with urination   '[]'$ Blood in urine Skin:  '[]'$ Rashes   '[]'$ Ulcers   '[]'$ Wounds Psychological:  '[]'$ History of anxiety   '[]'$  History of major depression.  Physical Examination  Vitals:    04/04/22 0300 04/04/22 0400 04/04/22 0500 04/04/22 0548  BP: (!) 142/63 (!) 119/48 123/75 120/82  Pulse: 77 73 74 71  Resp: (!) 21 19 (!) 28 15  Temp:      TempSrc:      SpO2: 100% 95% 98% 98%  Weight:      Height:  5' (1.524 m)     Body mass index is 27.34 kg/m. Gen:  WD/WN, NAD Head: [place]/AT, No temporalis wasting. Prominent temp pulse not noted. Ear/Nose/Throat: Hearing grossly intact, nares w/o erythema or drainage, oropharynx w/o Erythema/Exudate Eyes: Sclera non-icteric, conjunctiva clear Neck: Trachea midline.  No JVD.  Pulmonary:  Good air movement, respirations not labored, equal bilaterally.  Cardiac: RRR, normal S1, S2. Vascular: Slight discoloration to left foot but warm.  Nonpalpable pulses bilaterally  Gastrointestinal: soft, non-tender/non-distended. No guarding/reflex.  Musculoskeletal: M/S 5/5 throughout.  Extremities without ischemic changes.  No deformity or atrophy. No edema. Neurologic: Sensation grossly intact in extremities.  Symmetrical.  Speech is fluent. Motor exam as listed above. Psychiatric: Judgment intact, Mood & affect appropriate for pt's clinical situation. Dermatologic: No rashes or ulcers noted.  No cellulitis or open wounds. Lymph : No Cervical, Axillary, or Inguinal lymphadenopathy.    CBC Lab Results  Component Value Date   WBC 8.5 04/04/2022   HGB 10.1 (L) 04/04/2022   HCT 32.1 (L) 04/04/2022   MCV 91.2 04/04/2022   PLT 183 04/04/2022    BMET    Component Value Date/Time   NA 136 04/03/2022 1930   K 3.9 04/03/2022 1930   CL 102 04/03/2022 1930   CO2 24 04/03/2022 1930   GLUCOSE 116 (H) 04/03/2022 1930   BUN 16 04/03/2022 1930   CREATININE 1.08 (H) 04/04/2022 0614   CALCIUM 9.6 04/03/2022 1930   GFRNONAA 56 (L) 04/04/2022 0614   GFRAA >60 11/08/2015 0419   Estimated Creatinine Clearance: 40.9 mL/min (A) (by C-G formula based on SCr of 1.08 mg/dL (H)).  COAG Lab Results  Component Value Date   INR 1.0 06/22/2021   INR  0.89 11/07/2015    Radiology DG Chest 2 View  Result Date: 04/03/2022 CLINICAL DATA:  Weakness EXAM: CHEST - 2 VIEW COMPARISON:  Chest x-ray 11/08/2011, CT abdomen pelvis 11/30/2021, CT abdomen pelvis 06/24/2021 FINDINGS: The heart and mediastinal contours are within normal limits. Atherosclerotic plaque. Interval development of patchy airspace opacity of the right lower lung zone. No pulmonary edema. No pleural effusion. No pneumothorax. No acute osseous abnormality. IMPRESSION: 1. Interval development of patchy airspace opacity of the right lower lung zone. Followup PA and lateral chest X-ray is recommended in 3-4 weeks following therapy to ensure resolution and exclude underlying malignancy. 2.  Aortic Atherosclerosis (ICD10-I70.0). Electronically Signed   By: Iven Finn M.D.   On: 04/03/2022 19:58   DG Tibia/Fibula Left  Result Date: 04/03/2022 CLINICAL DATA:  Weakness EXAM: LEFT TIBIA AND FIBULA - 2 VIEW COMPARISON:  None Available. FINDINGS: There is no evidence of fracture or other focal bone lesions. Soft  tissues are unremarkable. Atherosclerotic plaque. Partially visualized screw fixation of the foot. IMPRESSION: No acute displaced fracture or dislocation. Electronically Signed   By: Iven Finn M.D.   On: 04/03/2022 20:00   CT Head Wo Contrast  Result Date: 04/03/2022 CLINICAL DATA:  Blurred vision EXAM: CT HEAD WITHOUT CONTRAST TECHNIQUE: Contiguous axial images were obtained from the base of the skull through the vertex without intravenous contrast. RADIATION DOSE REDUCTION: This exam was performed according to the departmental dose-optimization program which includes automated exposure control, adjustment of the mA and/or kV according to patient size and/or use of iterative reconstruction technique. COMPARISON:  02/26/2022 FINDINGS: Brain: No evidence of acute infarction, hemorrhage, hydrocephalus, extra-axial collection or mass lesion/mass effect. Vascular: Intracranial  atherosclerosis. Skull: Normal. Negative for fracture or focal lesion. Sinuses/Orbits: The visualized paranasal sinuses are essentially clear. The mastoid air cells are unopacified. Other: None. IMPRESSION: Normal head CT. Electronically Signed   By: Julian Hy M.D.   On: 04/03/2022 19:36   CT Chest W Contrast  Result Date: 04/04/2022 CLINICAL DATA:  Abnormal chest x-ray EXAM: CT CHEST WITH CONTRAST TECHNIQUE: Multidetector CT imaging of the chest was performed during intravenous contrast administration. RADIATION DOSE REDUCTION: This exam was performed according to the departmental dose-optimization program which includes automated exposure control, adjustment of the mA and/or kV according to patient size and/or use of iterative reconstruction technique. CONTRAST:  71m OMNIPAQUE IOHEXOL 300 MG/ML  SOLN COMPARISON:  Chest x-ray 04/03/2022.  Chest CT 06/22/2021 FINDINGS: Cardiovascular: Diffuse coronary artery and scattered moderate aortic calcifications. Heart is normal size. Aorta is normal caliber. Mediastinum/Nodes: No mediastinal, hilar, or axillary adenopathy. Trachea and esophagus are unremarkable. Thyroid unremarkable. Lungs/Pleura: No confluent airspace opacities. In particular, no right lower lobe opacity as suggested on x-ray. No pleural effusions. Upper Abdomen: Imaging into the upper abdomen demonstrates no acute findings. Musculoskeletal: Chest wall soft tissues are unremarkable. No acute bony abnormality. IMPRESSION: No acute cardiopulmonary disease.  No right lower consolidation. Diffuse coronary artery disease. Aortic Atherosclerosis (ICD10-I70.0). Electronically Signed   By: KRolm BaptiseM.D.   On: 04/04/2022 01:08   MR BRAIN WO CONTRAST  Result Date: 04/04/2022 CLINICAL DATA:  Initial evaluation for neuro deficit, stroke suspected. EXAM: MRI HEAD WITHOUT CONTRAST TECHNIQUE: Multiplanar, multiecho pulse sequences of the brain and surrounding structures were obtained without  intravenous contrast. COMPARISON:  Prior CT from 04/03/2022. FINDINGS: Brain: Examination degraded by motion artifact. Generalized age-related cerebral atrophy. Chronic microvascular ischemic disease noted involving the supratentorial cerebral white matter and pons. Few scattered remote lacunar infarcts present about the right thalamus, left basal ganglia, and left corona radiata. Tiny remote left cerebellar infarct. Subtle subcentimeter focus of diffusion signal abnormality seen involving the cortical/subcortical aspect of the right frontoparietal region (series 5, images 34, 33), suspicious for a tiny acute to early subacute ischemic infarct. Involvement of the pre and postcentral gyri. No associated hemorrhage or mass effect. No other evidence for acute or subacute ischemia. Gray-white matter differentiation otherwise maintained. No areas of chronic cortical infarction. No visible acute or chronic intracranial blood products. No mass lesion, midline shift or mass effect. No hydrocephalus or extra-axial fluid collection. Vascular: Major intracranial vascular flow voids are maintained. Skull and upper cervical spine: Craniocervical junction within normal limits. Bone marrow signal intensity normal. No scalp soft tissue abnormality. Sinuses/Orbits: Prior bilateral ocular lens replacement. Paranasal sinuses are largely clear. Trace right mastoid effusion, of doubtful significance. Other: None. IMPRESSION: 1. Subtle subcentimeter focus of diffusion signal abnormality involving the cortical/subcortical aspect  of the right frontoparietal region, suspicious for a tiny acute to early subacute ischemic infarct. No associated hemorrhage or mass effect. 2. No other acute intracranial abnormality. 3. Underlying age-related cerebral atrophy with chronic small vessel ischemic disease with multiple remote lacunar infarcts as above. Electronically Signed   By: Jeannine Boga M.D.   On: 04/04/2022 02:49   DG Hip Unilat  With Pelvis 2-3 Views Left  Result Date: 04/03/2022 CLINICAL DATA:  Weakness EXAM: DG HIP (WITH OR WITHOUT PELVIS) 2-3V LEFT COMPARISON:  None Available. FINDINGS: Limited evaluation due to overlapping osseous structures and overlying soft tissues. There is no evidence of hip fracture or dislocation of the left hip. No acute displaced fracture or diastasis of the bones of the pelvis. Right hip grossly unremarkable. There is no evidence of arthropathy or other focal bone abnormality. Surgical tacks overlie the pelvis. Atherosclerotic plaque.  Aorto bi-iliac artery stent. IMPRESSION: 1. Negative for acute traumatic injury. 2. Aortic Atherosclerosis (ICD10-I70.0) in a patient status post aorto bi-iliac artery stent. Electronically Signed   By: Iven Finn M.D.   On: 04/03/2022 20:00      Assessment/Plan 1.  Peripheral arterial disease  In discussion with the patient and the timeline of her symptoms I suspect that this may be an acute on chronic occlusion.  It appears that she has been having rest pain for several months prior to presentation today.  Patient should undergo revascularization.  Given the patient's recent stroke, we will plan for revascularization if it is suitable from a neurology standpoint given that the patient may need tPA.  We will make patient n.p.o. for possible revascularization later today.  If today is not possible we will plan for tomorrow.  2.  Carotid stenosis  Today's CTA shows occlusion of her right common carotid artery, however the ICA appears to be filling through ECA collaterals.  The right ICA does appear to have an 80% stenosis.  The left ICA has a 65 to 70% stenosis.  Given her recent stroke it would be prudent for the patient to undergo a carotid angiogram to evaluate for possible revascularization options.  This can possibly be done at the same time as her lower extremity revascularization because there no planned interventions.  3.  Diabetes mellitus  more Recommended tight control in setting of multiple atherosclerotic risk factors   Family Communication: Family at bedside   Kris Hartmann, NP [facility] [phone] (Office Phone) [phone] (Office Fax)  04/04/2022 9:17 AM    This note was created with Dragon medical transcription system.  Any error is purely unintentional

## 2022-04-04 NOTE — H&P (Addendum)
History and Physical    Patient: Autumn Arnold DOB: [birth year] DOA: 04/04/2022 DOS: the patient was seen and examined on 04/04/2022 PCP: [name], MD  Patient coming from: Home  Chief Complaint:  Chief Complaint  Patient presents with   Altered Mental Status    HPI: Autumn Arnold is a 71 y.o. 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  She also had numbness and weakness of the left arm.  Her sister reported to the ED provider that for the past 2 nights she woke up on the floor beside her recliner.  Patient states she did not fall but does not know how she ended up on the floor.  Patient stayed for several hours in the waiting room and by the time of evaluation her symptoms had for the most part resolved but she still felt some weakness in the left arm. ED course and data review: On arrival BP 70/48 with pulse 111 and otherwise normal vitals.  BP improved to 110/51 following a 1 L fluid bolus.  EKG, personally viewed and interpreted, NSR at 90 with no acute ST-T wave changes.  Labs unremarkable except for slightly elevated WBC of 11,800.  Hemoglobin 11.2 which is at baseline.  Troponin not done.  CT head nonacute,.  Chest x-ray showed interval development of patchy airspace opacity of the right lower lung zone with recommendation for 3 to 4-week follow-up to ensure resolution and exclude underlying malignancy she also had an x-ray of the hip and left tib-fib that were negative for acute fracture.  CT chest with contrast showed no acute cardiopulmonary disease.  MRI  brain: IMPRESSION: 1. Subtle subcentimeter focus of diffusion signal abnormality involving the cortical/subcortical aspect of the right frontoparietal region, suspicious for a tiny acute to early subacute ischemic infarct. No associated hemorrhage or mass effect. 2. No other acute intracranial abnormality. 3. Underlying age-related cerebral atrophy with chronic small vessel ischemic disease with multiple remote lacunar infarcts as above     Review of Systems: As mentioned in the history of present illness. All other systems reviewed and are negative.  Past Medical History:  Diagnosis Date   Anxiety    Arthritis    Asthma    Bilateral lower extremity edema    COPD (chronic obstructive pulmonary disease) (HCC)    Cough    CHRONIC   Diabetes mellitus 2008   Edema    LEGS/FEET   GERD (gastroesophageal reflux disease)    Hyperlipidemia    Hypertension    Shortness of breath dyspnea    DOE   Sleep apnea    CPAP at [facility], Dr. Humphrey Rolls   Stroke [facility]) 02/2010   headache, left arm numbness   Wheezing    Past Surgical History:  Procedure Laterality Date   ABDOMINAL HYSTERECTOMY     ANTERIOR FUSION CERVICAL SPINE     CARPAL TUNNEL RELEASE     Bilateral   CATARACT EXTRACTION W/PHACO Left 03/12/2016   Procedure: CATARACT EXTRACTION PHACO AND INTRAOCULAR LENS PLACEMENT (Page);  Surgeon: Estill Cotta, MD;  Location: [facility] ORS;  Service: Ophthalmology;  Laterality: Left;  [place] 01:12 AP% 23.4 CDE 29.83 fluid pack lot # [id number]   CATARACT EXTRACTION W/PHACO Right 04/16/2016   Procedure: CATARACT EXTRACTION PHACO AND INTRAOCULAR LENS PLACEMENT (IOC);  Surgeon: Estill Cotta, MD;  Location: [facility] ORS;  Service: Ophthalmology;  Laterality: Right;  [place] 01:17 AP% 22.5 CDE 35.08 Fluid pack lot # [id number]   CESAREAN SECTION     COLONOSCOPY WITH PROPOFOL N/A 08/12/2018   Procedure: COLONOSCOPY WITH PROPOFOL;  Surgeon: Toledo, Benay Pike, MD;  Location: [facility] ENDOSCOPY;  Service:  Gastroenterology;  Laterality: N/A;   ELBOW SURGERY     Tendonitis   ESOPHAGOGASTRODUODENOSCOPY (EGD) WITH PROPOFOL N/A 08/12/2018   Procedure: ESOPHAGOGASTRODUODENOSCOPY (EGD) WITH PROPOFOL;  Surgeon: Toledo, Benay Pike, MD;  Location: [facility] ENDOSCOPY;  Service: Gastroenterology;  Laterality: N/A;   INCONTINENCE SURGERY     LOWER EXTREMITY ANGIOGRAPHY Left 06/23/2021   Procedure: Lower Extremity Angiography;  Surgeon: Katha Cabal, MD;  Location: [facility];  Service: Cardiovascular;  Laterality: Left;   TONSILLECTOMY     Social History:  reports that she has been smoking cigarettes. She has been smoking an average of 1 pack per day. She has never used smokeless tobacco. She reports current alcohol use of about 7.0 standard drinks per week. She reports that she does not use drugs.  Allergies  Allergen Reactions   Lisinopril Other (See Comments)    Tongue burning sensation   Plavix [Clopidogrel] Other (See Comments)    Family History  Problem Relation Age of Onset   CAD Mother    Hypertension Mother    Arthritis Mother    Diabetes Father    CAD Father     Prior to Admission medications   Medication Sig Start Date End Date Taking? Authorizing Provider  albuterol (VENTOLIN HFA) 108 (90 Base) MCG/ACT inhaler Inhale 2 puffs into the lungs as needed.    [provider]  alendronate (FOSAMAX) 70 MG tablet Take 70 mg by mouth once a week. Take with a full glass of water on an empty stomach.    [provider]  amitriptyline (ELAVIL) 150 MG tablet Take by mouth daily. Reported on 12/20/2015    [provider]  aspirin EC 81 MG EC tablet Take 1 tablet (81 mg total) by mouth daily. 11/08/15   Gladstone Lighter, MD  atorvastatin (LIPITOR) 80 MG tablet Take 80 mg by mouth daily. 06/13/21   [provider]  busPIRone (BUSPAR) 5 MG tablet Take 5 mg by mouth 2 (two) times daily. 04/18/21   [provider]  Calcium Carb-Cholecalciferol 600-400  MG-UNIT TABS Take 1 tablet by mouth 2 (two) times daily.    [provider]  clopidogrel (PLAVIX) 75 MG tablet Take 1 tablet (75 mg total) by mouth daily. Patient not taking: Reported on 02/26/2022 06/28/21   Fritzi Mandes, MD  gabapentin (NEURONTIN) 600 MG tablet Take 1,200 mg by mouth 2 (two) times daily. 04/18/21   [provider]  HYDROcodone-acetaminophen (NORCO/VICODIN) 5-325 MG tablet Take 1-2 tablets by mouth every 8 (eight) hours as needed for moderate pain or severe pain. Patient not taking: Reported on 02/26/2022 06/28/21   Fritzi Mandes, MD  metFORMIN (GLUCOPHAGE) 1000 MG tablet Take 1,000 mg by mouth 2 (two) times daily. 06/13/21   [provider]  metoprolol tartrate (LOPRESSOR) 50 MG tablet Take 50 mg by mouth 2 (two) times daily. 04/18/21   [provider]  montelukast (SINGULAIR) 10 MG tablet Take 10 mg by mouth daily. 06/13/21  [provider]  Multiple Vitamin (MULTIVITAMIN WITH MINERALS) TABS tablet Take 1 tablet by mouth daily. Patient not taking: Reported on 02/26/2022 06/29/21   Fritzi Mandes, MD  nicotine (NICODERM CQ - DOSED IN MG/24 HOURS) 21 mg/24hr patch Place 1 patch (21 mg total) onto the skin daily. Patient not taking: Reported on 02/26/2022 06/29/21   Fritzi Mandes, MD  pantoprazole (PROTONIX) 40 MG tablet Take 40 mg by mouth daily. 01/30/21   [provider]  sertraline (ZOLOFT) 50 MG tablet Take 3 tablets (150 mg total) by mouth daily. 06/28/21   Fritzi Mandes, MD  tiZANidine (ZANAFLEX) 4 MG tablet Take 4 mg by mouth 2 (two) times daily. 05/22/21   [provider]  vitamin B-12 (CYANOCOBALAMIN) 1000 MCG tablet Take 1,000 mcg by mouth daily. Reported on 12/20/2015    [provider]    Physical Exam: Vitals:   04/03/22 1911 04/03/22 1923 04/03/22 1929 04/04/22 0041  BP: (!) 70/48 (!) 115/55  (!) 110/51  Pulse: (!) 111 75  73  Resp:  20  (!) 24  Temp:  97.9 F (36.6 C)    TempSrc:  Oral    SpO2:  100%  100%   Weight:   63.5 kg    Physical Exam Vitals and nursing note reviewed.  Constitutional:      General: She is not in acute distress. HENT:     Head: Normocephalic and atraumatic.  Cardiovascular:     Rate and Rhythm: Normal rate and regular rhythm.     Pulses:          Femoral pulses are 2+ on the left side.      Popliteal pulses are 1+ on the left side.       Dorsalis pedis pulses are 0 on the left side.       Posterior tibial pulses are 0 on the left side.     Heart sounds: Normal heart sounds.  Pulmonary:     Effort: Pulmonary effort is normal.     Breath sounds: Normal breath sounds.  Abdominal:     Palpations: Abdomen is soft.     Tenderness: There is no abdominal tenderness.  Neurological:     General: No focal deficit present.     Mental Status: Mental status is at baseline.     Cranial Nerves: No cranial nerve deficit.     Motor: No weakness.    Labs on Admission: I have personally reviewed following labs and imaging studies  CBC: Recent Labs  Lab 04/03/22 1930  WBC 11.8*  NEUTROABS 8.1*  HGB 11.2*  HCT 35.5*  MCV 91.3  PLT 564   Basic Metabolic Panel: Recent Labs  Lab 04/03/22 1930  NA 136  K 3.9  CL 102  CO2 24  GLUCOSE 116*  BUN 16  CREATININE 0.93  CALCIUM 9.6   GFR: CrCl cannot be calculated (Unknown ideal weight.). Liver Function Tests: Recent Labs  Lab 04/03/22 1930  AST 15  ALT 12  ALKPHOS 89  BILITOT 0.4  PROT 7.1  ALBUMIN 3.7   No results for input(s): LIPASE, AMYLASE in the last 168 hours. No results for input(s): AMMONIA in the last 168 hours. Coagulation Profile: No results for input(s): INR, PROTIME in the last 168 hours. Cardiac Enzymes: No results for input(s): CKTOTAL, CKMB, CKMBINDEX, TROPONINI in the last 168 hours. BNP (last 3 results) No results for input(s): PROBNP in the last 8760 hours. HbA1C: No results for input(s): HGBA1C in the last 72  hours. CBG: Recent Labs  Lab 04/03/22 1923  GLUCAP 123*   Lipid  Profile: No results for input(s): CHOL, HDL, LDLCALC, TRIG, CHOLHDL, LDLDIRECT in the last 72 hours. Thyroid Function Tests: No results for input(s): TSH, T4TOTAL, FREET4, T3FREE, THYROIDAB in the last 72 hours. Anemia Panel: No results for input(s): VITAMINB12, FOLATE, FERRITIN, TIBC, IRON, RETICCTPCT in the last 72 hours. Urine analysis:    Component Value Date/Time   COLORURINE YELLOW (A) 06/22/2021 2052   APPEARANCEUR CLEAR (A) 06/22/2021 2052   LABSPEC 1.010 06/22/2021 2052   PHURINE 6.0 06/22/2021 2052   GLUCOSEU 50 (A) 06/22/2021 2052   HGBUR NEGATIVE 06/22/2021 2052   BILIRUBINUR NEGATIVE 06/22/2021 2052   KETONESUR 5 (A) 06/22/2021 2052   PROTEINUR NEGATIVE 06/22/2021 2052   NITRITE NEGATIVE 06/22/2021 2052   LEUKOCYTESUR NEGATIVE 06/22/2021 2052    Radiological Exams on Admission: DG Chest 2 View  Result Date: 04/03/2022 CLINICAL DATA:  Weakness EXAM: CHEST - 2 VIEW COMPARISON:  Chest x-ray 11/08/2011, CT abdomen pelvis 11/30/2021, CT abdomen pelvis 06/24/2021 FINDINGS: The heart and mediastinal contours are within normal limits. Atherosclerotic plaque. Interval development of patchy airspace opacity of the right lower lung zone. No pulmonary edema. No pleural effusion. No pneumothorax. No acute osseous abnormality. IMPRESSION: 1. Interval development of patchy airspace opacity of the right lower lung zone. Followup PA and lateral chest X-ray is recommended in 3-4 weeks following therapy to ensure resolution and exclude underlying malignancy. 2.  Aortic Atherosclerosis (ICD10-I70.0). Electronically Signed   By: Iven Finn M.D.   On: 04/03/2022 19:58   DG Tibia/Fibula Left  Result Date: 04/03/2022 CLINICAL DATA:  Weakness EXAM: LEFT TIBIA AND FIBULA - 2 VIEW COMPARISON:  None Available. FINDINGS: There is no evidence of fracture or other focal bone lesions. Soft tissues are unremarkable. Atherosclerotic plaque. Partially visualized screw fixation of the foot. IMPRESSION: No  acute displaced fracture or dislocation. Electronically Signed   By: Iven Finn M.D.   On: 04/03/2022 20:00   CT Head Wo Contrast  Result Date: 04/03/2022 CLINICAL DATA:  Blurred vision EXAM: CT HEAD WITHOUT CONTRAST TECHNIQUE: Contiguous axial images were obtained from the base of the skull through the vertex without intravenous contrast. RADIATION DOSE REDUCTION: This exam was performed according to the departmental dose-optimization program which includes automated exposure control, adjustment of the mA and/or kV according to patient size and/or use of iterative reconstruction technique. COMPARISON:  02/26/2022 FINDINGS: Brain: No evidence of acute infarction, hemorrhage, hydrocephalus, extra-axial collection or mass lesion/mass effect. Vascular: Intracranial atherosclerosis. Skull: Normal. Negative for fracture or focal lesion. Sinuses/Orbits: The visualized paranasal sinuses are essentially clear. The mastoid air cells are unopacified. Other: None. IMPRESSION: Normal head CT. Electronically Signed   By: Julian Hy M.D.   On: 04/03/2022 19:36   CT Chest W Contrast  Result Date: 04/04/2022 CLINICAL DATA:  Abnormal chest x-ray EXAM: CT CHEST WITH CONTRAST TECHNIQUE: Multidetector CT imaging of the chest was performed during intravenous contrast administration. RADIATION DOSE REDUCTION: This exam was performed according to the departmental dose-optimization program which includes automated exposure control, adjustment of the mA and/or kV according to patient size and/or use of iterative reconstruction technique. CONTRAST:  76m OMNIPAQUE IOHEXOL 300 MG/ML  SOLN COMPARISON:  Chest x-ray 04/03/2022.  Chest CT 06/22/2021 FINDINGS: Cardiovascular: Diffuse coronary artery and scattered moderate aortic calcifications. Heart is normal size. Aorta is normal caliber. Mediastinum/Nodes: No mediastinal, hilar, or axillary adenopathy. Trachea and esophagus are unremarkable. Thyroid unremarkable.  Lungs/Pleura: No confluent airspace opacities.  In particular, no right lower lobe opacity as suggested on x-ray. No pleural effusions. Upper Abdomen: Imaging into the upper abdomen demonstrates no acute findings. Musculoskeletal: Chest wall soft tissues are unremarkable. No acute bony abnormality. IMPRESSION: No acute cardiopulmonary disease.  No right lower consolidation. Diffuse coronary artery disease. Aortic Atherosclerosis (ICD10-I70.0). Electronically Signed   By: Rolm Baptise M.D.   On: 04/04/2022 01:08   MR BRAIN WO CONTRAST  Result Date: 04/04/2022 CLINICAL DATA:  Initial evaluation for neuro deficit, stroke suspected. EXAM: MRI HEAD WITHOUT CONTRAST TECHNIQUE: Multiplanar, multiecho pulse sequences of the brain and surrounding structures were obtained without intravenous contrast. COMPARISON:  Prior CT from 04/03/2022. FINDINGS: Brain: Examination degraded by motion artifact. Generalized age-related cerebral atrophy. Chronic microvascular ischemic disease noted involving the supratentorial cerebral white matter and pons. Few scattered remote lacunar infarcts present about the right thalamus, left basal ganglia, and left corona radiata. Tiny remote left cerebellar infarct. Subtle subcentimeter focus of diffusion signal abnormality seen involving the cortical/subcortical aspect of the right frontoparietal region (series 5, images 34, 33), suspicious for a tiny acute to early subacute ischemic infarct. Involvement of the pre and postcentral gyri. No associated hemorrhage or mass effect. No other evidence for acute or subacute ischemia. Gray-white matter differentiation otherwise maintained. No areas of chronic cortical infarction. No visible acute or chronic intracranial blood products. No mass lesion, midline shift or mass effect. No hydrocephalus or extra-axial fluid collection. Vascular: Major intracranial vascular flow voids are maintained. Skull and upper cervical spine: Craniocervical junction  within normal limits. Bone marrow signal intensity normal. No scalp soft tissue abnormality. Sinuses/Orbits: Prior bilateral ocular lens replacement. Paranasal sinuses are largely clear. Trace right mastoid effusion, of doubtful significance. Other: None. IMPRESSION: 1. Subtle subcentimeter focus of diffusion signal abnormality involving the cortical/subcortical aspect of the right frontoparietal region, suspicious for a tiny acute to early subacute ischemic infarct. No associated hemorrhage or mass effect. 2. No other acute intracranial abnormality. 3. Underlying age-related cerebral atrophy with chronic small vessel ischemic disease with multiple remote lacunar infarcts as above. Electronically Signed   By: Jeannine Boga M.D.   On: 04/04/2022 02:49   DG Hip Unilat With Pelvis 2-3 Views Left  Result Date: 04/03/2022 CLINICAL DATA:  Weakness EXAM: DG HIP (WITH OR WITHOUT PELVIS) 2-3V LEFT COMPARISON:  None Available. FINDINGS: Limited evaluation due to overlapping osseous structures and overlying soft tissues. There is no evidence of hip fracture or dislocation of the left hip. No acute displaced fracture or diastasis of the bones of the pelvis. Right hip grossly unremarkable. There is no evidence of arthropathy or other focal bone abnormality. Surgical tacks overlie the pelvis. Atherosclerotic plaque.  Aorto bi-iliac artery stent. IMPRESSION: 1. Negative for acute traumatic injury. 2. Aortic Atherosclerosis (ICD10-I70.0) in a patient status post aorto bi-iliac artery stent. Electronically Signed   By: Iven Finn M.D.   On: 04/03/2022 20:00     Data Reviewed: Relevant notes from primary care and specialist visits, past discharge summaries as available in EHR, including Care Everywhere. Prior diagnostic testing as pertinent to current admission diagnoses Updated medications and problem lists for reconciliation ED course, including vitals, labs, imaging, treatment and response to  treatment Triage notes, nursing and pharmacy notes and ED provider's notes Notable results as noted in HPI   Assessment and Plan: * Acute CVA (cerebrovascular accident) ([place]) History of left frontal and parietal CVA 06/2021 with residual mild cognitive deficit Currently on aspirin and atorvastatin 80 Permissive hypertension for first 24-48 hrs  post stroke onset: Prn Labetalol IV or Vasotec IV If BP greater than 220/120  Statins for LDL goal less than 70 ASA '81mg'$  daily, Plavix '75mg'$  daily pending further neurology recommendations Telemetry, and echocardiogram Avoid dextrose containing fluids, Maintain euglycemia, euthermia Neuro checks q4 hrs x 24 hrs and then per shift Head of bed 30 degrees Physical therapy/Occupational therapy/Speech therapy if failed dysphagia screen Neurology consult to follow   Hypotension BP was 70/48 on arrival improving to 110/51 after 1 liter fluid bolus Etiology uncertain Hold metoprolol IV hydration and monitor  Critical limb ischemia of left lower extremity (HCC) PVD s/p aorto iliac and SMA stent 06/2021 Currently on aspirin, and atorvastatin.  Had Plavix We will hold off on heparin due to acute CVA Vascular consult  Chronic anemia Hemoglobin stable at baseline of around 11.2  COPD (chronic obstructive pulmonary disease) (HCC) Not acutely exacerbated Albuterol as needed  Type 2 diabetes mellitus with hyperlipidemia (HCC) Sliding scale insulin coverage  Tobacco dependence Nicotine patch and tobacco cessation counseling  Anxiety and depression Continue buspirone, sertraline and amitriptyline   DVT prophylaxis: Will be on heparin infusion  Consults: Vascular, Dr. Lucky Cowboy  Advance Care Planning:   Code Status: Prior   Family Communication: Sister at bedside  Disposition Plan: Back to previous home environment  Severity of Illness: The appropriate patient status for this patient is INPATIENT. Inpatient status is judged to be reasonable and  necessary in order to provide the required intensity of service to ensure the patient's safety. The patient's presenting symptoms, physical exam findings, and initial radiographic and laboratory data in the context of their chronic comorbidities is felt to place them at high risk for further clinical deterioration. Furthermore, it is not anticipated that the patient will be medically stable for discharge from the hospital within 2 midnights of admission.   * I certify that at the point of admission it is my clinical judgment that the patient will require inpatient hospital care spanning beyond 2 midnights from the point of admission due to high intensity of service, high risk for further deterioration and high frequency of surveillance required.*  Author: Athena Masse, MD 04/04/2022 2:08 AM  For on call review [email or web address].

## 2022-04-05 ENCOUNTER — Inpatient Hospital Stay: Payer: Medicare HMO

## 2022-04-05 ENCOUNTER — Encounter
Admission: EM | Disposition: A | Discharge status: Home Health | Payer: Self-pay | Source: Home / Self Care | Attending: Student

## 2022-04-05 ENCOUNTER — Inpatient Hospital Stay
Admit: 2022-04-05 | Discharge: 2022-04-05 | Disposition: A | Discharge status: Home / Self Care | Payer: Medicare HMO | Attending: Internal Medicine | Admitting: Internal Medicine

## 2022-04-05 DIAGNOSIS — I6523 Occlusion and stenosis of bilateral carotid arteries: Secondary | ICD-10-CM

## 2022-04-05 DIAGNOSIS — T82858A Stenosis of vascular prosthetic devices, implants and grafts, initial encounter: Secondary | ICD-10-CM

## 2022-04-05 DIAGNOSIS — I959 Hypotension, unspecified: Secondary | ICD-10-CM | POA: Diagnosis not present

## 2022-04-05 DIAGNOSIS — D649 Anemia, unspecified: Secondary | ICD-10-CM | POA: Diagnosis not present

## 2022-04-05 DIAGNOSIS — I639 Cerebral infarction, unspecified: Secondary | ICD-10-CM | POA: Diagnosis not present

## 2022-04-05 DIAGNOSIS — I70201 Unspecified atherosclerosis of native arteries of extremities, right leg: Secondary | ICD-10-CM

## 2022-04-05 DIAGNOSIS — I70222 Atherosclerosis of native arteries of extremities with rest pain, left leg: Secondary | ICD-10-CM | POA: Diagnosis not present

## 2022-04-05 DIAGNOSIS — I771 Stricture of artery: Secondary | ICD-10-CM

## 2022-04-05 HISTORY — PX: LOWER EXTREMITY ANGIOGRAPHY: CATH118251

## 2022-04-05 LAB — ECHOCARDIOGRAM COMPLETE
AR max vel: 1.3 cm2
AV Area VTI: 1.17 cm2
AV Area mean vel: 1.09 cm2
AV Mean grad: 3 mmHg
AV Peak grad: 4.9 mmHg
Ao pk vel: 1.11 m/s
Area-P 1/2: 7.09 cm2
Height: 60 in
MV VTI: 0.95 cm2
P 1/2 time: 532 msec
S' Lateral: 2.46 cm

## 2022-04-05 LAB — CBC
HCT: 31.6 % — ABNORMAL LOW (ref 36.0–46.0)
Hemoglobin: 10.2 g/dL — ABNORMAL LOW (ref 12.0–15.0)
MCH: 28.7 pg (ref 26.0–34.0)
MCHC: 32.3 g/dL (ref 30.0–36.0)
MCV: 88.8 fL (ref 80.0–100.0)
Platelets: 194 10*3/uL (ref 150–400)
RBC: 3.56 MIL/uL — ABNORMAL LOW (ref 3.87–5.11)
RDW: 15 % (ref 11.5–15.5)
WBC: 6.7 10*3/uL (ref 4.0–10.5)
nRBC: 0 % (ref 0.0–0.2)

## 2022-04-05 LAB — RENAL FUNCTION PANEL
Albumin: 3.1 g/dL — ABNORMAL LOW (ref 3.5–5.0)
Anion gap: 5 (ref 5–15)
BUN: 12 mg/dL (ref 8–23)
CO2: 26 mmol/L (ref 22–32)
Calcium: 8.8 mg/dL — ABNORMAL LOW (ref 8.9–10.3)
Chloride: 108 mmol/L (ref 98–111)
Creatinine, Ser: 0.74 mg/dL (ref 0.44–1.00)
GFR, Estimated: >60 mL/min (ref >60)
Glucose, Bld: 131 mg/dL — ABNORMAL HIGH (ref 70–99)
Phosphorus: 3.3 mg/dL (ref 2.5–4.6)
Potassium: 4.6 mmol/L (ref 3.5–5.1)
Sodium: 139 mmol/L (ref 135–145)

## 2022-04-05 LAB — GLUCOSE, CAPILLARY
Glucose-Capillary: 138 mg/dL — ABNORMAL HIGH (ref 70–99)
Glucose-Capillary: 118 mg/dL — ABNORMAL HIGH (ref 70–99)
Glucose-Capillary: 104 mg/dL — ABNORMAL HIGH (ref 70–99)
Glucose-Capillary: 84 mg/dL (ref 70–99)

## 2022-04-05 LAB — MAGNESIUM: Magnesium: 1.6 mg/dL — ABNORMAL LOW (ref 1.7–2.4)

## 2022-04-05 SURGERY — LOWER EXTREMITY ANGIOGRAPHY
Anesthesia: Moderate Sedation | Laterality: Left

## 2022-04-05 MED ORDER — MIDAZOLAM HCL 2 MG/ML PO SYRP: 8.0000 mg | ORAL_SOLUTION | Freq: Once | ORAL | Status: DC | PRN

## 2022-04-05 MED ORDER — DIPHENHYDRAMINE HCL 50 MG/ML IJ SOLN: 50.0000 mg | Freq: Once | INTRAMUSCULAR | Status: DC | PRN

## 2022-04-05 MED ORDER — ONDANSETRON HCL 4 MG/2ML IJ SOLN: 4.0000 mg | Freq: Four times a day (QID) | INTRAMUSCULAR | Status: DC | PRN

## 2022-04-05 MED ORDER — FENTANYL CITRATE (PF) 100 MCG/2ML IJ SOLN
INTRAMUSCULAR | Status: AC
  Filled 2022-04-05: qty 2

## 2022-04-05 MED ORDER — FENTANYL CITRATE (PF) 100 MCG/2ML IJ SOLN
INTRAMUSCULAR | Status: DC | PRN
  Administered 2022-04-05: 50 ug via INTRAVENOUS
  Administered 2022-04-05: 25 ug via INTRAVENOUS

## 2022-04-05 MED ORDER — SODIUM CHLORIDE 0.9 % IV SOLN: INTRAVENOUS | Status: DC

## 2022-04-05 MED ORDER — CEFAZOLIN SODIUM-DEXTROSE 2-4 GM/100ML-% IV SOLN: 2.0000 g | INTRAVENOUS | Status: AC

## 2022-04-05 MED ORDER — HEPARIN SODIUM (PORCINE) 1000 UNIT/ML IJ SOLN
INTRAMUSCULAR | Status: AC
  Filled 2022-04-05: qty 10

## 2022-04-05 MED ORDER — HEPARIN SODIUM (PORCINE) 1000 UNIT/ML IJ SOLN
INTRAMUSCULAR | Status: DC | PRN
  Administered 2022-04-05: 5000 [IU] via INTRAVENOUS

## 2022-04-05 MED ORDER — MIDAZOLAM HCL 2 MG/2ML IJ SOLN
INTRAMUSCULAR | Status: DC | PRN
  Administered 2022-04-05 (×2): .5 mg via INTRAVENOUS
  Administered 2022-04-05: 1 mg via INTRAVENOUS

## 2022-04-05 MED ORDER — IODIXANOL 320 MG/ML IV SOLN
INTRAVENOUS | Status: DC | PRN
  Administered 2022-04-05: 65 mL

## 2022-04-05 MED ORDER — METHYLPREDNISOLONE SODIUM SUCC 125 MG IJ SOLR: 125.0000 mg | Freq: Once | INTRAMUSCULAR | Status: DC | PRN

## 2022-04-05 MED ORDER — MAGNESIUM SULFATE 2 GM/50ML IV SOLN
2.0000 g | Freq: Once | INTRAVENOUS | Status: AC
  Administered 2022-04-05: 2 g via INTRAVENOUS
  Filled 2022-04-05: qty 50

## 2022-04-05 MED ORDER — CEFAZOLIN SODIUM-DEXTROSE 2-4 GM/100ML-% IV SOLN
INTRAVENOUS | Status: AC
  Administered 2022-04-05: 2 g via INTRAVENOUS
  Filled 2022-04-05: qty 100

## 2022-04-05 MED ORDER — FAMOTIDINE 20 MG PO TABS: 40.0000 mg | ORAL_TABLET | Freq: Once | ORAL | Status: DC | PRN

## 2022-04-05 MED ORDER — HYDROMORPHONE HCL 1 MG/ML IJ SOLN
1.0000 mg | Freq: Once | INTRAMUSCULAR | Status: AC | PRN
  Administered 2022-04-12: 1 mg via INTRAVENOUS
  Filled 2022-04-05: qty 1

## 2022-04-05 MED ORDER — OXYCODONE HCL 5 MG PO TABS
5.0000 mg | ORAL_TABLET | Freq: Three times a day (TID) | ORAL | Status: DC | PRN
  Administered 2022-04-05 – 2022-04-11 (×10): 5 mg via ORAL
  Filled 2022-04-05 (×10): qty 1

## 2022-04-05 MED ORDER — TRAMADOL HCL 50 MG PO TABS
50.0000 mg | ORAL_TABLET | Freq: Three times a day (TID) | ORAL | Status: DC | PRN
  Administered 2022-04-05 – 2022-04-09 (×9): 50 mg via ORAL
  Filled 2022-04-05 (×9): qty 1

## 2022-04-05 MED ORDER — MIDAZOLAM HCL 5 MG/5ML IJ SOLN
INTRAMUSCULAR | Status: AC
  Filled 2022-04-05: qty 5

## 2022-04-05 SURGICAL SUPPLY — 13 items
CATH ANGIO 5F PIGTAIL 100CM (CATHETERS) ×1 IMPLANT
CATH ANGIO 5F PIGTAIL 65CM (CATHETERS) ×1 IMPLANT
CATH BEACON 5 .035 100 H1 TIP (CATHETERS) ×1 IMPLANT
CATH BEACON 5 .035 100 JB2 TIP (CATHETERS) ×1 IMPLANT
COVER PROBE U/S 5X48 (MISCELLANEOUS) ×1 IMPLANT
DEVICE STARCLOSE SE CLOSURE (Vascular Products) ×1 IMPLANT
DEVICE TORQUE .025-.038 (MISCELLANEOUS) ×1 IMPLANT
GLIDEWIRE ANGLED SS 035X260CM (WIRE) ×1 IMPLANT
PACK ANGIOGRAPHY (CUSTOM PROCEDURE TRAY) ×3 IMPLANT
SHEATH BRITE TIP 5FRX11 (SHEATH) ×1 IMPLANT
SYR MEDRAD MARK 7 150ML (SYRINGE) ×1 IMPLANT
TUBING CONTRAST HIGH PRESS 72 (TUBING) ×1 IMPLANT
WIRE GUIDERIGHT .035X150 (WIRE) ×1 IMPLANT

## 2022-04-05 NOTE — Op Note (Signed)
[facility]   OPERATIVE NOTE  DATE: 04/05/2022  PRE-OPERATIVE DIAGNOSIS: 1.  Bilateral carotid artery stenosis 2.  CT scan suggesting right common carotid artery occlusion as well as right subclavian artery stenosis 3.  Left lower extremity rest pain from atherosclerotic occlusive disease  POST-OPERATIVE DIAGNOSIS: Same as above  PROCEDURE: 1.   Ultrasound Guidance for vascular access right femoral artery 2.   Catheter placement into innominate artery and into left common carotid artery from right femoral approach 3.   Thoracic aortogram 4.   Cervical and cerebral left carotid angiograms 5.   Right subclavian and carotid angiograms in the cervical portion 6.   Catheter placement to the aorta from right femoral approach 7.   Aortogram and iliofemoral angiograms bilaterally  SURGEON: [name], MD  ASSISTANT(S): None  ANESTHESIA: Moderate conscious sedation  ESTIMATED BLOOD LOSS: 5 cc  FLUORO TIME: 5.1 minutes  CONTRAST: 65 cc  MODERATE CONSCIOUS SEDATION TIME: Approximately 31 minutes using 2 mg of Versed and 75 mcg of Fentanyl  FINDING(S): 1.  Left cervical carotid artery stenosis of approximately 50% with brisk intracranial flow and significant left to right intracranial filling. 2.  occlusion of the right common carotid artery with filling through collaterals from the external carotid artery into the internal carotid artery was heavily calcified and diseased.  When went to an RAO projection I evaluated the right subclavian artery for possible inflow for a right subclavian to carotid bypass, but there was an 80 to 90% stenosis in the right subclavian artery and treatment of this area would potentially compromise the carotid subclavian bypass as the disease extended into this area. 3.  This showed a stent in the infrarenal aorta which was small but not necessarily diseased.  The left common iliac artery was occluded including the previously placed stent.   The previously placed stent in the right common iliac artery was patent.  The right external iliac artery and common femoral artery appear to be patent with only mild disease.  There was reconstitution through the left hypogastric artery into the left external iliac artery which became heavily diseased distally in the left common femoral artery in the proximal portions of the SFA and profunda femoris arteries were occluded.  This would not be amenable to endovascular revascularization and the patient actually does not have enough flow to expect to heal an above-knee amputation at this point.  She will need a left femoral endarterectomy as well as at least treatment of her inflow disease with recanalization of her left common iliac artery or a femoral to femoral bypass.  She may still require major amputation on the left even with this procedure.  SPECIMEN(S):  None  INDICATIONS:   Patient is a 71 y.o.female who presents with multiple vascular issues including an acute right hemispheric stroke as well as significant rest pain for several weeks in the left lower extremity. The patient's renal function precluded CT angiogram. Catheter-based angiogram is performed for further evaluation. Risks and benefits are discussed and informed consent was obtained.  DESCRIPTION: After obtaining full informed written consent, the patient was brought back to the operating room and placed supine upon the vascular suite table.  After obtaining adequate anesthesia, the patient was prepped and draped in the standard fashion.  Moderate conscious sedation was administered during a face to face encounter with the patient throughout the procedure with my supervision of the RN administering medicines and monitoring the patients vital signs and mental status throughout from the  start of the procedure until the patient was taken to the recovery room. The right femoral artery was visualized with ultrasound and found to be calcific but  patent. It was then accessed under direct ultrasound guidance without difficulty with a Seldinger needle. A J-wire and 5 French sheath were placed and a permanent image was recorded. The patient was given 5000 units of intravenous heparin. A pigtail catheter was placed into the ascending aorta and an LAO projection thoracic aortogram was performed. This showed and from the thoracic aortogram the right common carotid artery appeared to be occluded but filled through collaterals through the external carotid artery and the internal carotid artery.  I then selected a headhunter catheter and cannulated the innominate artery.  Imaging was then performed which confirmed the occlusion of the right common carotid artery with filling through collaterals from the external carotid artery into the internal carotid artery was heavily calcified and diseased.  When went to an RAO projection I evaluated the right subclavian artery for possible inflow for a right subclavian to carotid bypass, but there was an 80 to 90% stenosis in the right subclavian artery and treatment of this area would potentially compromise the carotid subclavian bypass as the disease extended into this area.  I then turned my attention back to the thoracic aorta and removed the catheter from the right side. I selectively cannulated the left common carotid artery without difficulty with a JB2 catheter and advanced into the mid left common carotid artery. Selective imaging was then performed of the cervical and cerebral carotid artery on the left. Intracranial filling was brisk with significant left-to-right cross-filling and no intracranial deficit. The cervical carotid artery was calcified and demonstrated an approximately 50% stenosis. Multiple views were taken in the cervical carotid artery bilaterally. I then turned my attention to her lower extremity ischemic issues.  A pigtail catheter was placed in the aorta at the L1 level and an AP aortogram was  performed.  This showed a stent in the infrarenal aorta which was small but not necessarily diseased.  The left common iliac artery was occluded including the previously placed stent.  The previously placed stent in the right common iliac artery was patent.  I then pulled down to the distal aorta and performed pelvic obliques to evaluate the iliacs and femoral arteries on each side.  The right external iliac artery and common femoral artery appear to be patent with only mild disease.  There was reconstitution through the left hypogastric artery into the left external iliac artery which became heavily diseased distally in the left common femoral artery in the proximal portions of the SFA and profunda femoris arteries were occluded.  This would not be amenable to endovascular revascularization and the patient actually does not have enough flow to expect a healed above-knee amputation at this point.  She will need a left femoral endarterectomy as well as at least treatment of her inflow disease with recanalization of her left common iliac artery or a femoral to femoral bypass.  At this point, we had imaging to plan our treatment and we elected to terminate the procedure. The diagnostic catheter was removed. Oblique arteriogram was performed of the right femoral artery and StarClose closure device was deployed in usual fashion with excellent hemostatic result. The patient tolerated the procedure well and was taken to the recovery room in stable condition.  COMPLICATIONS: None  CONDITION: Stable   [name] 04/05/2022 4:45 PM   This note was created with [facility]  transcription system. Any errors in dictation are purely unintentional.  

## 2022-04-05 NOTE — Progress Notes (Signed)
*  PRELIMINARY RESULTS* Echocardiogram 2D Echocardiogram has been performed.  Autumn Arnold 04/05/2022, 10:49 AM

## 2022-04-05 NOTE — Interval H&P Note (Signed)
History and Physical Interval Note:  04/05/2022 2:42 PM  Autumn Arnold  has presented today for surgery, with the diagnosis of Left lower extremity angiogram with possible intervention and diagnostic carotid angiogram.  The various methods of treatment have been discussed with the patient and family. After consideration of risks, benefits and other options for treatment, the patient has consented to  Procedure(s): Lower Extremity Angiography (Left) as a surgical intervention.  The patient's history has been reviewed, patient examined, no change in status, stable for surgery.  I have reviewed the patient's chart and labs.  Questions were answered to the patient's satisfaction.     [name]

## 2022-04-05 NOTE — Progress Notes (Signed)
PT Cancellation Note  Patient Details Name: Autumn Arnold MRN: [id number] DOB: 1951-01-03   Cancelled Treatment:    Reason Eval/Treat Not Completed: Patient at procedure or test/unavailable Pt off floor for angiogram, will maintain on caseload and continue to see her as appropriate.  Kreg Shropshire, DPT 04/05/2022, 4:26 PM

## 2022-04-05 NOTE — Progress Notes (Signed)
OT Cancellation Note  Patient Details Name: SAACHI ZALE MRN: [id number] DOB: [birth year]   Cancelled Treatment:    Reason Eval/Treat Not Completed: Other (comment) (Pt off floor, OT will reattempt as able)  Shanon Payor, OTD OTR/L  04/05/22, 3:24 PM

## 2022-04-05 NOTE — Progress Notes (Signed)
PROGRESS NOTE  Autumn Arnold KZS:[id number] DOB: 26-Mar-1951   PCP: [name], MD  Patient is from: Home.  Lives with husband.  DOA: 04/04/2022 LOS: 1  Chief complaints Chief Complaint  Patient presents with   Altered Mental Status     Brief Narrative / Interim history: 71 year old F with PMH of CVA, OSA, DM-2, HTN, PVD s/p aortoiliac and SMA stent on DAPT and mild cognitive deficits, anxiety and tobacco use disorder presenting with altered mental status, blurry vision, aphasia and left arm numbness and weakness, and admitted for CVA evaluation.  She was hypotensive to 70s/40s.  MRI brain concerning for right frontoparietal acute to early subacute ischemic CVA.  Reportedly, her symptoms resolved in ED except for some residual left arm weakness and numbness.  There was also concern about critical left limb ischemia.  Neurology and vascular surgery consulted.  The next day, CTA head and neck.  Showed unchanged occlusion of right common carotid and plaque at the proximal right and left ICA causing less than 50% stenosis.  No new occlusion or hemodynamically significant stenosis noted.  TTE, carotid US and lower extremity angiography pending.  Subjective: Seen and examined earlier this morning.  No major events overnight of this morning.  She is complaining of left leg cramping.  Family member at bedside.  Objective: Vitals:   04/04/22 2346 04/05/22 0515 04/05/22 0745 04/05/22 1153  BP: 127/74 134/68 (!) 155/53 (!) 137/44  Pulse: 74 80 82 75  Resp: '16 16 17 16  '$ Temp: 98.1 F (36.7 C) 97.9 F (36.6 C) 98.7 F (37.1 C) 97.9 F (36.6 C)  TempSrc: Oral Oral Oral Oral  SpO2: 97% 99% 96%   Weight:      Height:        Examination:  GENERAL: No apparent distress.  Nontoxic. HEENT: MMM.  Vision and hearing grossly intact.  NECK: Supple.  No apparent JVD.  RESP:  No IWOB.  Fair aeration bilaterally. CVS:  RRR. Heart sounds normal.  ABD/GI/GU: BS+. Abd soft, NTND.  MSK/EXT:   Moves extremities. No apparent deformity. No edema.  No palpable DP pulse SKIN: no apparent skin lesion or wound NEURO: Awake and alert. Oriented appropriately.  No apparent focal neuro deficit except for mild LLE weakness. PSYCH: Calm. Normal affect.  Procedures:  None  Microbiology summarized: None  Assessment and Plan: * Acute CVA (cerebrovascular accident) [facility]) MRI concerning for acute to early subacute right frontoparietal CVA.  She seems to have residual left lower extremity weakness.  Per family, speech is back to baseline.  CTA head and neck with chronic right common carotid artery occlusion.  LDL 64.  A1c 7.6%.  She is already on DAPT for PVD.  -Neurology recommends carotid US with a special attention to right CCA to assess for possible trickle flow -Follow echocardiogram and carotid ultrasound. -SBP goal 120s to 140s.  Out of permissive HTN window. -Continue statin, Plavix and aspirin. -PT/OT/SLP -Encourage tobacco cessation.    Hypotension BP was 70/48.  Now normotensive. -Normotensive for low-dose metoprolol.  Critical limb ischemia of left lower extremity (HCC) PVD s/p aorto iliac and SMA stent 06/2021.  She has some left foot erythema with no palpable DP or PT pulse on my exam.  She complains cramping pain likely claudication -Plan for lower extremity angiography this afternoon -Continue Plavix, aspirin and atorvastatin. -Pain control with as needed Tylenol, tramadol and oxycodone -Encouraged smoking cessation  Chronic anemia Recent Labs    06/24/21 2347 06/25/21 0644 06/25/21 1550 06/26/21  0500 06/27/21 0338 06/28/21 0545 02/26/22 0200 04/03/22 1930 04/04/22 0614 04/05/22 0553  HGB 7.8* 7.3* 10.5* 9.1* 9.3* 9.5* 11.5* 11.2* 10.1* 10.2*  H&H relatively stable. -Check anemia panel in the morning   Type 2 diabetes mellitus with hyperlipidemia (HCC) A1c 7.6%. Recent Labs  Lab 04/03/22 1923 04/04/22 1637 04/04/22 2104 04/05/22 0840 04/05/22 1158   GLUCAP 123* 118* 126* 138* 118*  Continue SSI and statin   Hypomagnesemia Mg 1.6.  IV magnesium sulfate 2 g x 1  COPD (chronic obstructive pulmonary disease) (HCC) Stable.  Over 60-pack-year history.  Currently smokes about a pack a day.  Discussed the importance of smoking cessation.  -Nicotine patch as needed  Tobacco dependence Nicotine patch and tobacco cessation counseling  OSA (obstructive sleep apnea) Continue oxygen at night  Anxiety and depression Stable.  Continue buspirone, sertraline and amitriptyline     DVT prophylaxis:  enoxaparin (LOVENOX) injection 40 mg Start: 04/04/22 0800  Code Status: Full code Family Communication: Updated patient's husband and brother at bedside. Level of care: Telemetry Medical Status is: Inpatient Remains inpatient appropriate because: Acute CVA and critical left limb ischemia   Final disposition: Home with home health once medically cleared Consultants:  Neurology Vascular surgery  Sch Meds:  Scheduled Meds:  aspirin EC  81 mg Oral Daily   atorvastatin  80 mg Oral QPM   busPIRone  5 mg Oral BID   clopidogrel  75 mg Oral Daily   enoxaparin (LOVENOX) injection  40 mg Subcutaneous Q24H   insulin aspart  0-5 Units Subcutaneous QHS   insulin aspart  0-9 Units Subcutaneous TID WC   metoprolol tartrate  12.5 mg Oral BID   nicotine  21 mg Transdermal Daily   pantoprazole  40 mg Oral Daily   sertraline  150 mg Oral Daily   Continuous Infusions:  sodium chloride 75 mL/hr at 04/05/22 0543   PRN Meds:.acetaminophen **OR** acetaminophen (TYLENOL) oral liquid 160 mg/5 mL **OR** acetaminophen, albuterol, oxyCODONE, traMADol  Antimicrobials: Anti-infectives (From admission, onward)    None        I have personally reviewed the following labs and images: CBC: Recent Labs  Lab 04/03/22 1930 04/04/22 0614 04/05/22 0553  WBC 11.8* 8.5 6.7  NEUTROABS 8.1*  --   --   HGB 11.2* 10.1* 10.2*  HCT 35.5* 32.1* 31.6*  MCV  91.3 91.2 88.8  PLT 213 183 194   BMP &GFR Recent Labs  Lab 04/03/22 1930 04/04/22 0614 04/05/22 0553  NA 136  --  139  K 3.9  --  4.6  CL 102  --  108  CO2 24  --  26  GLUCOSE 116*  --  131*  BUN 16  --  12  CREATININE 0.93 1.08* 0.74  CALCIUM 9.6  --  8.8*  MG  --   --  1.6*  PHOS  --   --  3.3   Estimated Creatinine Clearance: 55.2 mL/min (by C-G formula based on SCr of 0.74 mg/dL). Liver & Pancreas: Recent Labs  Lab 04/03/22 1930 04/05/22 0553  AST 15  --   ALT 12  --   ALKPHOS 89  --   BILITOT 0.4  --   PROT 7.1  --   ALBUMIN 3.7 3.1*   No results for input(s): LIPASE, AMYLASE in the last 168 hours. No results for input(s): AMMONIA in the last 168 hours. Diabetic: Recent Labs    04/04/22 0614  HGBA1C 7.6*   Recent Labs  Lab 04/03/22  1923 04/04/22 1637 04/04/22 2104 04/05/22 0840 04/05/22 1158  GLUCAP 123* 118* 126* 138* 118*   Cardiac Enzymes: No results for input(s): CKTOTAL, CKMB, CKMBINDEX, TROPONINI in the last 168 hours. No results for input(s): PROBNP in the last 8760 hours. Coagulation Profile: No results for input(s): INR, PROTIME in the last 168 hours. Thyroid Function Tests: No results for input(s): TSH, T4TOTAL, FREET4, T3FREE, THYROIDAB in the last 72 hours. Lipid Profile: Recent Labs    04/04/22 0614  CHOL 149  HDL 38*  LDLCALC 64  TRIG 236*  CHOLHDL 3.9   Anemia Panel: No results for input(s): VITAMINB12, FOLATE, FERRITIN, TIBC, IRON, RETICCTPCT in the last 72 hours. Urine analysis:    Component Value Date/Time   COLORURINE YELLOW (A) 06/22/2021 2052   APPEARANCEUR CLEAR (A) 06/22/2021 2052   LABSPEC 1.010 06/22/2021 2052   PHURINE 6.0 06/22/2021 2052   GLUCOSEU 50 (A) 06/22/2021 2052   HGBUR NEGATIVE 06/22/2021 2052   BILIRUBINUR NEGATIVE 06/22/2021 2052   KETONESUR 5 (A) 06/22/2021 2052   PROTEINUR NEGATIVE 06/22/2021 2052   NITRITE NEGATIVE 06/22/2021 2052   LEUKOCYTESUR NEGATIVE 06/22/2021 2052   Sepsis  Labs: Invalid input(s): PROCALCITONIN, [place]  Microbiology: No results found for this or any previous visit (from the past 240 hour(s)).  Radiology Studies: No results found.    Christyanna Mckeon T. [place]  If 7PM-7AM, please contact night-coverage [email or web address] 04/05/2022, 12:36 PM

## 2022-04-05 NOTE — Assessment & Plan Note (Addendum)
Monitor replenish as appropriate

## 2022-04-06 ENCOUNTER — Encounter: Payer: Self-pay | Admitting: Vascular Surgery

## 2022-04-06 DIAGNOSIS — I639 Cerebral infarction, unspecified: Secondary | ICD-10-CM | POA: Diagnosis not present

## 2022-04-06 DIAGNOSIS — D649 Anemia, unspecified: Secondary | ICD-10-CM | POA: Diagnosis not present

## 2022-04-06 DIAGNOSIS — I63239 Cerebral infarction due to unspecified occlusion or stenosis of unspecified carotid arteries: Secondary | ICD-10-CM

## 2022-04-06 DIAGNOSIS — I959 Hypotension, unspecified: Secondary | ICD-10-CM | POA: Diagnosis not present

## 2022-04-06 DIAGNOSIS — I63231 Cerebral infarction due to unspecified occlusion or stenosis of right carotid arteries: Secondary | ICD-10-CM

## 2022-04-06 DIAGNOSIS — I70222 Atherosclerosis of native arteries of extremities with rest pain, left leg: Secondary | ICD-10-CM | POA: Diagnosis not present

## 2022-04-06 LAB — MAGNESIUM: Magnesium: 1.7 mg/dL (ref 1.7–2.4)

## 2022-04-06 LAB — CBC
HCT: 33.2 % — ABNORMAL LOW (ref 36.0–46.0)
Hemoglobin: 10.8 g/dL — ABNORMAL LOW (ref 12.0–15.0)
MCH: 28.6 pg (ref 26.0–34.0)
MCHC: 32.5 g/dL (ref 30.0–36.0)
MCV: 87.8 fL (ref 80.0–100.0)
Platelets: 177 10*3/uL (ref 150–400)
RBC: 3.78 MIL/uL — ABNORMAL LOW (ref 3.87–5.11)
RDW: 14.9 % (ref 11.5–15.5)
WBC: 7.1 10*3/uL (ref 4.0–10.5)
nRBC: 0 % (ref 0.0–0.2)

## 2022-04-06 LAB — RENAL FUNCTION PANEL
Albumin: 3.2 g/dL — ABNORMAL LOW (ref 3.5–5.0)
Anion gap: 5 (ref 5–15)
BUN: 9 mg/dL (ref 8–23)
CO2: 26 mmol/L (ref 22–32)
Calcium: 8.2 mg/dL — ABNORMAL LOW (ref 8.9–10.3)
Chloride: 105 mmol/L (ref 98–111)
Creatinine, Ser: 0.7 mg/dL (ref 0.44–1.00)
GFR, Estimated: >60 mL/min (ref >60)
Glucose, Bld: 122 mg/dL — ABNORMAL HIGH (ref 70–99)
Phosphorus: 3.2 mg/dL (ref 2.5–4.6)
Potassium: 4.1 mmol/L (ref 3.5–5.1)
Sodium: 136 mmol/L (ref 135–145)

## 2022-04-06 LAB — GLUCOSE, CAPILLARY
Glucose-Capillary: 110 mg/dL — ABNORMAL HIGH (ref 70–99)
Glucose-Capillary: 144 mg/dL — ABNORMAL HIGH (ref 70–99)
Glucose-Capillary: 121 mg/dL — ABNORMAL HIGH (ref 70–99)
Glucose-Capillary: 121 mg/dL — ABNORMAL HIGH (ref 70–99)

## 2022-04-06 MED ORDER — MAGNESIUM SULFATE 2 GM/50ML IV SOLN
2.0000 g | Freq: Once | INTRAVENOUS | Status: AC
  Administered 2022-04-06: 2 g via INTRAVENOUS
  Filled 2022-04-06: qty 50

## 2022-04-06 NOTE — TOC Progression Note (Signed)
Transition of Care [facility]) - Progression Note    Patient Details  Name: Autumn Arnold MRN: [id number] Date of Birth: [birth year]  [facility] CM/SW [place], RN Phone Number: 04/06/2022, 11:36 AM  Clinical Narrative:   [place] notified of [place] needs.         Expected Discharge Plan and Services                                                 Social Determinants of Health (SDOH) Interventions    Readmission Risk Interventions    06/23/2021    2:20 PM  Readmission Risk Prevention Plan  Post Dischage Appt Complete  Medication Screening Complete  Transportation Screening Complete

## 2022-04-06 NOTE — Assessment & Plan Note (Addendum)
Has occluded right common carotid artery with collaterals from external carotid artery. -Continue DAPT and statin. -On IV fluid to avoid hypotension -Vascular surgery on board.

## 2022-04-06 NOTE — Progress Notes (Signed)
Physical Therapy Treatment Patient Details Name: Autumn Arnold MRN: [id number] DOB: 16-Feb-1951 Today's Date: 04/06/2022   History of Present Illness 71 y.o. 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 She also had numbness and weakness of the left arm - imaging revealed small acute/sub-acute CVA.    PT Comments    Pt agreeable to therapy and overall mobility is SBA to CGA. Pt ambulated 50 ft with RW and CGA. Initial plan to ambulate using SPC, but pt pain in L foot 10/10 NPS at start of session (9/10 NPS at end of treatment today). Nurse provided pt with pain medication at start of session. Obtained a youth-sized RW sized appropriately to pts height at end of session. Pt education provided to remain close to walker and push down with UEs to off-weight L foot when standing/walking.    Recommendations for follow up therapy are one component of a multi-disciplinary discharge planning process, led by the attending physician.  Recommendations may be updated based on patient status, additional functional criteria and insurance authorization.  Follow Up Recommendations  Home health PT     Assistance Recommended at Discharge PRN  Patient can return home with the following A little help with walking and/or transfers;Help with stairs or ramp for entrance   Equipment Recommendations  None recommended by PT    Recommendations for Other Services       Precautions / Restrictions Precautions Precautions: Fall Restrictions Weight Bearing Restrictions: No     Mobility  Bed Mobility                     Transfers Overall transfer level: Needs assistance Equipment used: Rolling walker (2 wheels) Transfers: Sit to/from Stand Sit to Stand: Min guard           General transfer comment: Verbal cues for scooting to EOB with feet flat on floor, hand placement    Ambulation/Gait Ambulation/Gait assistance: Min guard Gait Distance (Feet): 50 Feet Assistive device: Rolling walker (2 wheels) Gait Pattern/deviations: Antalgic, Decreased stance time - left, Decreased weight shift to left       General Gait Details: Was going to attempt gait with SPC, but pts pain in L foot 10/10 NPS at start. Initially avoided WB on L LE, but did weight shift with utilization of UE support on RW after the first few steps. (Provided pt with youth-sized RW which was sized appropriately at end of session and left in pt room.)   Stairs             Wheelchair Mobility    Modified Rankin (Stroke Patients Only)       Balance Overall balance assessment: Needs assistance Sitting-balance support: Feet supported, No upper extremity supported Sitting balance-Leahy Scale: Good Sitting balance - Comments: Able to sit without UE support as demonstrated when pt reached fwd for RW prior to standing up.   Standing balance support: Bilateral upper extremity supported Standing balance-Leahy Scale: Good Standing balance comment: Supported by RW in standing due to pain in L foot. No observed LOB in standing.  Cognition Arousal/Alertness: Awake/alert Behavior During Therapy: WFL for tasks assessed/performed                                   General Comments: Pt oriented to name and date of birth        Exercises      General Comments        Pertinent Vitals/Pain Pain Assessment Pain Assessment: 0-10 Pain Score: 10-Worst pain ever Pain Location: L foot Pain Descriptors / Indicators: Throbbing Pain Intervention(s): Limited  activity within patient's tolerance, Monitored during session, Repositioned    Home Living                          Prior Function            PT Goals (current goals can now be found in the care plan section) Acute Rehab PT Goals Patient Stated Goal: To go home. PT Goal Formulation: With patient Time For Goal Achievement: 04/18/22 Potential to Achieve Goals: Good Progress towards PT goals: Progressing toward goals    Frequency    Min 2X/week      PT Plan Current plan remains appropriate    Co-evaluation              AM-PAC PT "6 Clicks" Mobility   Outcome Measure  Help needed turning from your back to your side while in a flat bed without using bedrails?: None Help needed moving from lying on your back to sitting on the side of a flat bed without using bedrails?: None Help needed moving to and from a bed to a chair (including a wheelchair)?: None Help needed standing up from a chair using your arms (e.g., wheelchair or bedside chair)?: None Help needed to walk in hospital room?: A Little Help needed climbing 3-5 steps with a railing? : A Little 6 Click Score: 22    End of Session Equipment Utilized During Treatment: Gait belt Activity Tolerance: Patient tolerated treatment well;Patient limited by pain Patient left: in bed;with call bell/phone within reach;with bed alarm set;with family/visitor present;Other (comment) (pillow under LEs to float heels and blanket draped over foot board to avoid excess pressure on L foot) Nurse Communication: Mobility status PT Visit Diagnosis: Muscle weakness (generalized) (M62.81);Difficulty in walking, not elsewhere classified (R26.2);Unsteadiness on feet (R26.81);Other symptoms and signs involving the nervous system (R29.898)     Time: 5883-2549 PT Time Calculation (min) (ACUTE ONLY): 20 min  Charges:                        Rella Larve, SPT   Melessa Cowell 04/06/2022, 1:37 PM

## 2022-04-06 NOTE — Progress Notes (Signed)
Occupational Therapy Treatment Patient Details Name: Autumn Arnold MRN: [id number] DOB: 03-14-1951 Today's Date: 04/06/2022   History of present illness 71 y.o. 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 She also had numbness and weakness of the left arm - imaging revealed small acute/sub-acute CVA.   OT comments  Chart reviewed, RN cleared pt for participation in OT tx session. Pt alert and agreeable to tx session. Tx session targeted improving activity tolerance and safety during ADL completion. Improvements noted in bed mobility requiring MOD I, functional mobility with STS with CGA, amb toilet transfer with CGA with RW on this date. Sister endorses concerns for med management at home, pt reports she sorts pills in her own system and does not use a pill organizer. OT will continue to address, discharge recommendation remains appropriate.    Recommendations for follow up therapy are one component of a multi-disciplinary discharge planning process, led by the attending physician.  Recommendations may be updated based on patient status, additional functional criteria and insurance authorization.    Follow Up Recommendations  Home health OT    Assistance Recommended at Discharge Frequent or constant Supervision/Assistance  Patient can return home with the following  A little help with walking and/or transfers;A little help with bathing/dressing/bathroom   Equipment Recommendations  BSC/3in1;Tub/shower seat    Recommendations for Other Services      Precautions / Restrictions  Precautions Precautions: Fall Restrictions Weight Bearing Restrictions: No       Mobility Bed Mobility Overal bed mobility: Modified Independent                  Transfers Overall transfer level: Needs assistance Equipment used: Rolling walker (2 wheels) Transfers: Sit to/from Stand Sit to Stand: Min guard                 Balance Overall balance assessment: Needs assistance Sitting-balance support: Feet supported, No upper extremity supported Sitting balance-Leahy Scale: Good     Standing balance support: Bilateral upper extremity supported Standing balance-Leahy Scale: Good                             ADL either performed or assessed with clinical judgement   ADL       Grooming: Wash/dry hands;Sitting;Set up                   Toilet Transfer: Ambulation;Min guard;Rolling walker (2 wheels);Regular Glass blower/designer Details (indicate cue type and reason): simulated with RW         Functional mobility during ADLs: Min guard;Rolling walker (2 wheels) (approx 100')      Extremity/Trunk Assessment              Vision       Perception     Praxis      Cognition Arousal/Alertness: Awake/alert   Overall Cognitive Status: Impaired/Different from baseline Area of Impairment: Problem solving, Safety/judgement, Awareness  Safety/Judgement: Decreased awareness of deficits Awareness: Emergent Problem Solving: Slow processing, Requires verbal cues, Requires tactile cues          Exercises      Shoulder Instructions       General Comments vss throughout    Pertinent Vitals/ Pain       Pain Assessment Pain Assessment: Faces Faces Pain Scale: Hurts even more Pain Location: LLE Pain Descriptors / Indicators: Throbbing Pain Intervention(s): Limited activity within patient's tolerance, Monitored during session, Repositioned  Home Living                                           Prior Functioning/Environment              Frequency  Min 4X/week        Progress Toward Goals  OT Goals(current goals can now be found in the care plan section)  Progress towards OT goals: Progressing toward goals     Plan Discharge plan remains appropriate    Co-evaluation                 AM-PAC OT "6 Clicks" Daily Activity     Outcome Measure   Help from another person eating meals?: None Help from another person taking care of personal grooming?: None Help from another person toileting, which includes using toliet, bedpan, or urinal?: A Little Help from another person bathing (including washing, rinsing, drying)?: A Little Help from another person to put on and taking off regular upper body clothing?: A Little Help from another person to put on and taking off regular lower body clothing?: A Little 6 Click Score: 20    End of Session Equipment Utilized During Treatment: Rolling walker (2 wheels)  OT Visit Diagnosis: Unsteadiness on feet (R26.81);Repeated falls (R29.6);History of falling (Z91.81);Other symptoms and signs involving the nervous system (R29.898)   Activity Tolerance Patient tolerated treatment well   Patient Left with call bell/phone within reach;with family/visitor present;in bed   Nurse Communication Mobility status        Time: 5993-5701 OT Time Calculation (min): 14 min  Charges: OT General Charges $OT Visit: 1 Visit OT Treatments $Therapeutic Activity: 8-22 mins  Shanon Payor, OTD OTR/L  04/06/22, 3:53 PM

## 2022-04-06 NOTE — Care Management Important Message (Signed)
Important Message  Patient Details  Name: Autumn Arnold MRN: [id number] Date of Birth: 1951/08/26   Medicare Important Message Given:  Yes     Juliann Pulse A Kaydn Kumpf 04/06/2022, 10:07 AM

## 2022-04-06 NOTE — Consult Note (Signed)
[facility] Cardiology  CARDIOLOGY CONSULT NOTE  Patient ID: Autumn Arnold MRN: [id number] DOB/AGE: 1951/09/15 71 y.o.  Admit date: 04/04/2022 Referring Physician Cyndia Skeeters Primary Physician [place] Primary Cardiologist [facility] Reason for Consultation preoperative cardiovascular evaluation  HPI: 72 year old female referred for preoperative cardiovascular evaluation.  She admitted 04/04/2022 altered mental status, blurred vision, left arm paresthesias and weakness, and left leg pain.  The patient was evaluated by neurology, who felt symptoms were consistent with TIA and MCA territory with recommendations to continue aspirin clopidogrel and atorvastatin.  The patient was seen by Dr. Lucky Cowboy, lower extremity and carotid angiogram revealed the patient will require left femoral endarterectomy, left iliac stent, and right carotid endarterectomy and an extensive surgery tentatively scheduled for early next week.  The patient has a history of multiple CVAs, most recently left frontal and parietal CVA 06/2021, and iliac and SMA stent 06/2021.  The patient is lucid, correctly answering questions, reports doing well with the exception of left lower extremity discomfort.  She denies chest pain or shortness of breath.  She denies palpitations or heart racing.  2D echocardiogram 04/05/2022 revealed normal left ventricular function, with LVEF 60 to 65% with mild aortic insufficiency.  ECG reveals normal sinus rhythm at 60 bpm.  Telemetry reveals sinus rhythm at 76 bpm.  The patient does have no prior history of myocardial infarction, congestive heart failure, chronic kidney disease.  She does have type 2 diabetes, on insulin.  Review of systems complete and found to be negative unless listed above     Past Medical History:  Diagnosis Date   Anxiety    Arthritis    Asthma    Bilateral lower extremity edema    COPD (chronic obstructive pulmonary disease) (HCC)    Cough    CHRONIC   Diabetes mellitus 2008   Edema     LEGS/FEET   GERD (gastroesophageal reflux disease)    Hyperlipidemia    Hypertension    Shortness of breath dyspnea    DOE   Sleep apnea    CPAP at [facility], Dr. Humphrey Rolls   Stroke [facility]) 02/2010   headache, left arm numbness   Wheezing     Past Surgical History:  Procedure Laterality Date   ABDOMINAL HYSTERECTOMY     ANTERIOR FUSION CERVICAL SPINE     CARPAL TUNNEL RELEASE     Bilateral   CATARACT EXTRACTION W/PHACO Left 03/12/2016   Procedure: CATARACT EXTRACTION PHACO AND INTRAOCULAR LENS PLACEMENT ([place]);  Surgeon: Estill Cotta, MD;  Location: [facility] ORS;  Service: Ophthalmology;  Laterality: Left;  [place] 01:12 AP% 23.4 CDE 29.83 fluid pack lot # [id number]   CATARACT EXTRACTION W/PHACO Right 04/16/2016   Procedure: CATARACT EXTRACTION PHACO AND INTRAOCULAR LENS PLACEMENT (I[facility]);  Surgeon: Estill Cotta, MD;  Location: [facility] ORS;  Service: Ophthalmology;  Laterality: Right;  [place] 01:17 AP% 22.5 CDE 35.08 Fluid pack lot # [id number]   CESAREAN SECTION     COLONOSCOPY WITH PROPOFOL N/A 08/12/2018   Procedure: COLONOSCOPY WITH PROPOFOL;  Surgeon: Toledo, Benay Pike, MD;  Location: [facility] ENDOSCOPY;  Service: Gastroenterology;  Laterality: N/A;   ELBOW SURGERY     Tendonitis   ESOPHAGOGASTRODUODENOSCOPY (EGD) WITH PROPOFOL N/A 08/12/2018   Procedure: ESOPHAGOGASTRODUODENOSCOPY (EGD) WITH PROPOFOL;  Surgeon: Toledo, Benay Pike, MD;  Location: [facility] ENDOSCOPY;  Service: Gastroenterology;  Laterality: N/A;   INCONTINENCE SURGERY     LOWER EXTREMITY ANGIOGRAPHY Left 06/23/2021   Procedure: Lower Extremity Angiography;  Surgeon: Katha Cabal, MD;  Location: [facility] [facility];  Service: Cardiovascular;  Laterality: Left;   TONSILLECTOMY      Medications Prior to Admission  Medication Sig Dispense Refill Last Dose   alendronate (FOSAMAX) 70 MG tablet Take 70 mg by mouth once a week. Take with a full glass of water on an empty stomach.   Past Week   vitamin B-12 (CYANOCOBALAMIN) 1000 MCG tablet  Take 1,000 mcg by mouth daily. Reported on 12/20/2015   Past Week   albuterol (VENTOLIN HFA) 108 (90 Base) MCG/ACT inhaler Inhale 2 puffs into the lungs as needed.   PRN at PRN   amitriptyline (ELAVIL) 150 MG tablet Take by mouth daily. Reported on 12/20/2015   04/02/2022 at NIGHT   aspirin EC 81 MG EC tablet Take 1 tablet (81 mg total) by mouth daily. 30 tablet 2 04/02/2022 at NIGHT   atorvastatin (LIPITOR) 80 MG tablet Take 80 mg by mouth daily.   04/02/2022 at NIGHT   busPIRone (BUSPAR) 5 MG tablet Take 5 mg by mouth 2 (two) times daily.      Calcium Carb-Cholecalciferol 600-400 MG-UNIT TABS Take 1 tablet by mouth 2 (two) times daily.   04/02/2022 at NIGHT   clopidogrel (PLAVIX) 75 MG tablet Take 1 tablet (75 mg total) by mouth daily. (Patient not taking: Reported on 02/26/2022) 30 tablet 2    gabapentin (NEURONTIN) 600 MG tablet Take 1,200 mg by mouth 2 (two) times daily.   04/02/2022 at NIGHT   HYDROcodone-acetaminophen (NORCO/VICODIN) 5-325 MG tablet Take 1-2 tablets by mouth every 8 (eight) hours as needed for moderate pain or severe pain. (Patient not taking: Reported on 02/26/2022) 10 tablet 0 Not Taking   metFORMIN (GLUCOPHAGE) 1000 MG tablet Take 1,000 mg by mouth 2 (two) times daily.   04/02/2022 at NIGHT   metoprolol tartrate (LOPRESSOR) 50 MG tablet Take 50 mg by mouth 2 (two) times daily.   04/02/2022 at NIGHT   montelukast (SINGULAIR) 10 MG tablet Take 10 mg by mouth daily.   04/02/2022 at NIGHT   Multiple Vitamin (MULTIVITAMIN WITH MINERALS) TABS tablet Take 1 tablet by mouth daily. 30 tablet 0 04/02/2022 at NIGHT   nicotine (NICODERM CQ - DOSED IN MG/24 HOURS) 21 mg/24hr patch Place 1 patch (21 mg total) onto the skin daily. (Patient not taking: Reported on 02/26/2022) 28 patch 0 Not Taking   pantoprazole (PROTONIX) 40 MG tablet Take 40 mg by mouth daily.   04/02/2022 at NIGHT   sertraline (ZOLOFT) 50 MG tablet Take 3 tablets (150 mg total) by mouth daily. 90 tablet 0 04/02/2022 at NIGHT    tiZANidine (ZANAFLEX) 4 MG tablet Take 4 mg by mouth 2 (two) times daily.   04/02/2022 at [place] History   Socioeconomic History   Marital status: Married    Spouse name: Not on file   Number of children: Not on file   Years of education: Not on file   Highest education level: Not on file  Occupational History   Not on file  Tobacco Use   Smoking status: Every Day    Packs/day: 1.00    Types: Cigarettes   Smokeless tobacco: Never  Substance and Sexual Activity   Alcohol use: Yes    Alcohol/week: 7.0 standard drinks    Types: 7 Cans of beer per week   Drug use: No   Sexual activity: Not on file  Other Topics Concern   Not on file  Social History Narrative   Not on file   Social Determinants of Health  Financial Resource Strain: Not on file  Food Insecurity: Not on file  Transportation Needs: Not on file  Physical Activity: Not on file  Stress: Not on file  Social Connections: Not on file  Intimate Partner Violence: Not on file    Family History  Problem Relation Age of Onset   CAD Mother    Hypertension Mother    Arthritis Mother    Diabetes Father    CAD Father       Review of systems complete and found to be negative unless listed above      PHYSICAL EXAM  General: Well developed, well nourished, in no acute distress HEENT:  Normocephalic and atramatic Neck:  No JVD.  Lungs: Clear bilaterally to auscultation and percussion. Heart: HRRR . Normal S1 and S2 without gallops or murmurs.  Abdomen: Bowel sounds are positive, abdomen soft and non-tender  Msk:  Back normal, normal gait. Normal strength and tone for age. Extremities: No clubbing, cyanosis or edema.   Neuro: Alert and oriented X 3. Psych:  Good affect, responds appropriately  Labs:   Lab Results  Component Value Date   WBC 7.1 04/06/2022   HGB 10.8 (L) 04/06/2022   HCT 33.2 (L) 04/06/2022   MCV 87.8 04/06/2022   PLT 177 04/06/2022    Recent Labs  Lab 04/03/22 1930  04/04/22 0614 04/06/22 0612  NA 136   < > 136  K 3.9   < > 4.1  CL 102   < > 105  CO2 24   < > 26  BUN 16   < > 9  CREATININE 0.93   < > 0.70  CALCIUM 9.6   < > 8.2*  PROT 7.1  --   --   BILITOT 0.4  --   --   ALKPHOS 89  --   --   ALT 12  --   --   AST 15  --   --   GLUCOSE 116*   < > 122*   < > = values in this interval not displayed.   Lab Results  Component Value Date   TROPONINI 0.03 11/07/2015    Lab Results  Component Value Date   CHOL 149 04/04/2022   CHOL 250 (H) 06/23/2021   CHOL 305 (H) 11/08/2015   Lab Results  Component Value Date   HDL 38 (L) 04/04/2022   HDL 38 (L) 06/23/2021   HDL 36 (L) 11/08/2015   Lab Results  Component Value Date   LDLCALC 64 04/04/2022   LDLCALC 164 (H) 06/23/2021   LDLCALC 206 (H) 11/08/2015   Lab Results  Component Value Date   TRIG 236 (H) 04/04/2022   TRIG 242 (H) 06/23/2021   TRIG 314 (H) 11/08/2015   Lab Results  Component Value Date   CHOLHDL 3.9 04/04/2022   CHOLHDL 6.6 06/23/2021   CHOLHDL 8.5 11/08/2015   No results found for: LDLDIRECT    Radiology: CT ANGIO HEAD NECK W WO CM  Result Date: 04/04/2022 CLINICAL DATA:  Stroke/TIA, determine embolic source EXAM: CT ANGIOGRAPHY HEAD AND NECK TECHNIQUE: Multidetector CT imaging of the head and neck was performed using the standard protocol during bolus administration of intravenous contrast. Multiplanar CT image reconstructions and MIPs were obtained to evaluate the vascular anatomy. Carotid stenosis measurements (when applicable) are obtained utilizing NASCET criteria, using the distal internal carotid diameter as the denominator. RADIATION DOSE REDUCTION: This exam was performed according to the departmental dose-optimization program which includes automated exposure control, adjustment  of the mA and/or kV according to patient size and/or use of iterative reconstruction technique. CONTRAST:  30m OMNIPAQUE IOHEXOL 350 MG/ML SOLN COMPARISON:  Recent CT and MR imaging,  CTA neck August 2022, MRA August 2022 FINDINGS: CT HEAD Brain: There is no acute intracranial hemorrhage, mass effect, or edema. No new loss of gray-white differentiation. Chronic right thalamic infarct. Chronic infarct of the left basal ganglia and adjacent white matter. There is no extra-axial fluid collection. Ventricles and sulci are stable in size and configuration. Vascular: No new finding. Skull: Calvarium is unremarkable. Sinuses/Orbits: No acute finding. Other: None. Review of the MIP images confirms the above findings CTA NECK Aortic arch: Mixed plaque along the arch and great vessel origins. As before, there is moderate to marked stenosis of the right subclavian proximally. Right carotid system: Common carotid remains occluded just beyond the origin. Reconstitution just below the bifurcation. There is mixed plaque extending along the proximal internal carotid with stable less than 50% stenosis. Left carotid system: Patent. Mixed plaque along the common carotid with less than 50% stenosis. Mixed plaque along the proximal internal carotid causing approximately 50% stenosis. Vertebral arteries: Patent. Plaque at the origins causing mild to moderate stenosis. Left vertebral is mildly dominant. Skeleton: Cervical spine degenerative changes are similar in appearance. Other neck: Unremarkable. Upper chest: Refer to same day chest CT Review of the MIP images confirms the above findings CTA HEAD Anterior circulation: Intracranial internal carotid arteries are patent with calcified plaque. There is moderate stenosis of the supraclinoid right ICA. Posterior circulation: Intracranial vertebral arteries are patent with plaque causing mild stenosis. Basilar artery is patent. Posterior cerebral arteries are patent. A right posterior communicating artery is identified. Venous sinuses: Patent as allowed by contrast bolus timing. Review of the MIP images confirms the above findings IMPRESSION: No acute intracranial  abnormality. Unchanged occlusion of the right common carotid just beyond its origin to just proximal to the bifurcation. No new occlusion or hemodynamically significant stenosis. Plaque at the proximal right ICA causes less than 50% stenosis. Plaque at the proximal left ICA causes 50% stenosis. Electronically Signed   By: PMacy MisM.D.   On: 04/04/2022 10:37   DG Chest 2 View  Result Date: 04/03/2022 CLINICAL DATA:  Weakness EXAM: CHEST - 2 VIEW COMPARISON:  Chest x-ray 11/08/2011, CT abdomen pelvis 11/30/2021, CT abdomen pelvis 06/24/2021 FINDINGS: The heart and mediastinal contours are within normal limits. Atherosclerotic plaque. Interval development of patchy airspace opacity of the right lower lung zone. No pulmonary edema. No pleural effusion. No pneumothorax. No acute osseous abnormality. IMPRESSION: 1. Interval development of patchy airspace opacity of the right lower lung zone. Followup PA and lateral chest X-ray is recommended in 3-4 weeks following therapy to ensure resolution and exclude underlying malignancy. 2.  Aortic Atherosclerosis (ICD10-I70.0). Electronically Signed   By: MIven FinnM.D.   On: 04/03/2022 19:58   DG Tibia/Fibula Left  Result Date: 04/03/2022 CLINICAL DATA:  Weakness EXAM: LEFT TIBIA AND FIBULA - 2 VIEW COMPARISON:  None Available. FINDINGS: There is no evidence of fracture or other focal bone lesions. Soft tissues are unremarkable. Atherosclerotic plaque. Partially visualized screw fixation of the foot. IMPRESSION: No acute displaced fracture or dislocation. Electronically Signed   By: MIven FinnM.D.   On: 04/03/2022 20:00   CT Head Wo Contrast  Result Date: 04/03/2022 CLINICAL DATA:  Blurred vision EXAM: CT HEAD WITHOUT CONTRAST TECHNIQUE: Contiguous axial images were obtained from the base of the skull through the vertex  without intravenous contrast. RADIATION DOSE REDUCTION: This exam was performed according to the departmental dose-optimization  program which includes automated exposure control, adjustment of the mA and/or kV according to patient size and/or use of iterative reconstruction technique. COMPARISON:  02/26/2022 FINDINGS: Brain: No evidence of acute infarction, hemorrhage, hydrocephalus, extra-axial collection or mass lesion/mass effect. Vascular: Intracranial atherosclerosis. Skull: Normal. Negative for fracture or focal lesion. Sinuses/Orbits: The visualized paranasal sinuses are essentially clear. The mastoid air cells are unopacified. Other: None. IMPRESSION: Normal head CT. Electronically Signed   By: Julian Hy M.D.   On: 04/03/2022 19:36   CT Chest W Contrast  Result Date: 04/04/2022 CLINICAL DATA:  Abnormal chest x-ray EXAM: CT CHEST WITH CONTRAST TECHNIQUE: Multidetector CT imaging of the chest was performed during intravenous contrast administration. RADIATION DOSE REDUCTION: This exam was performed according to the departmental dose-optimization program which includes automated exposure control, adjustment of the mA and/or kV according to patient size and/or use of iterative reconstruction technique. CONTRAST:  17m OMNIPAQUE IOHEXOL 300 MG/ML  SOLN COMPARISON:  Chest x-ray 04/03/2022.  Chest CT 06/22/2021 FINDINGS: Cardiovascular: Diffuse coronary artery and scattered moderate aortic calcifications. Heart is normal size. Aorta is normal caliber. Mediastinum/Nodes: No mediastinal, hilar, or axillary adenopathy. Trachea and esophagus are unremarkable. Thyroid unremarkable. Lungs/Pleura: No confluent airspace opacities. In particular, no right lower lobe opacity as suggested on x-ray. No pleural effusions. Upper Abdomen: Imaging into the upper abdomen demonstrates no acute findings. Musculoskeletal: Chest wall soft tissues are unremarkable. No acute bony abnormality. IMPRESSION: No acute cardiopulmonary disease.  No right lower consolidation. Diffuse coronary artery disease. Aortic Atherosclerosis (ICD10-I70.0).  Electronically Signed   By: KRolm BaptiseM.D.   On: 04/04/2022 01:08   MR BRAIN WO CONTRAST  Result Date: 04/04/2022 CLINICAL DATA:  Initial evaluation for neuro deficit, stroke suspected. EXAM: MRI HEAD WITHOUT CONTRAST TECHNIQUE: Multiplanar, multiecho pulse sequences of the brain and surrounding structures were obtained without intravenous contrast. COMPARISON:  Prior CT from 04/03/2022. FINDINGS: Brain: Examination degraded by motion artifact. Generalized age-related cerebral atrophy. Chronic microvascular ischemic disease noted involving the supratentorial cerebral white matter and pons. Few scattered remote lacunar infarcts present about the right thalamus, left basal ganglia, and left corona radiata. Tiny remote left cerebellar infarct. Subtle subcentimeter focus of diffusion signal abnormality seen involving the cortical/subcortical aspect of the right frontoparietal region (series 5, images 34, 33), suspicious for a tiny acute to early subacute ischemic infarct. Involvement of the pre and postcentral gyri. No associated hemorrhage or mass effect. No other evidence for acute or subacute ischemia. Gray-white matter differentiation otherwise maintained. No areas of chronic cortical infarction. No visible acute or chronic intracranial blood products. No mass lesion, midline shift or mass effect. No hydrocephalus or extra-axial fluid collection. Vascular: Major intracranial vascular flow voids are maintained. Skull and upper cervical spine: Craniocervical junction within normal limits. Bone marrow signal intensity normal. No scalp soft tissue abnormality. Sinuses/Orbits: Prior bilateral ocular lens replacement. Paranasal sinuses are largely clear. Trace right mastoid effusion, of doubtful significance. Other: None. IMPRESSION: 1. Subtle subcentimeter focus of diffusion signal abnormality involving the cortical/subcortical aspect of the right frontoparietal region, suspicious for a tiny acute to early  subacute ischemic infarct. No associated hemorrhage or mass effect. 2. No other acute intracranial abnormality. 3. Underlying age-related cerebral atrophy with chronic small vessel ischemic disease with multiple remote lacunar infarcts as above. Electronically Signed   By: BJeannine BogaM.D.   On: 04/04/2022 02:49   [place]oreaCarotid Bilateral  Result Date:  04/05/2022 CLINICAL DATA:  Cerebrovascular accident EXAM: BILATERAL CAROTID DUPLEX ULTRASOUND TECHNIQUE: Pearline Cables scale imaging, color Doppler and duplex ultrasound were performed of bilateral carotid and vertebral arteries in the neck. COMPARISON:  None Available. FINDINGS: Criteria: Quantification of carotid stenosis is based on velocity parameters that correlate the residual internal carotid diameter with NASCET-based stenosis levels, using the diameter of the distal internal carotid lumen as the denominator for stenosis measurement. The following velocity measurements were obtained: RIGHT ICA: 139/43 cm/sec CCA: 67/2 cm/sec SYSTOLIC ICA/CCA RATIO:  2.1 ECA:  114 cm/sec LEFT ICA: 208/31 cm/sec CCA: 094/70 cm/sec SYSTOLIC ICA/CCA RATIO:  1.5 ECA:  190 cm/sec RIGHT CAROTID ARTERY: No flow visualized within the right proximal and mid common carotid artery. Flow is visualized distally in the common carotid artery just proximal to the bifurcation. The external carotid artery flow is reversed. Abnormal arterial waveform in the internal carotid artery. Modest heterogeneous atherosclerotic plaque in the proximal internal carotid artery. Evaluation of peak systolic velocity criteria is limited in the setting of proximal common carotid occlusion. RIGHT VERTEBRAL ARTERY: Retrograde flow in the right vertebral artery. LEFT CAROTID ARTERY: Heterogeneous and irregular/ulcerated atherosclerotic plaque in the left internal carotid artery. Peak systolic velocity is likely spuriously elevated secondary to contralateral occlusion. LEFT VERTEBRAL ARTERY:  Patent with normal  antegrade flow. IMPRESSION: 1. Right common carotid artery occlusion with distal reconstitution via retrograde flow through the external carotid artery. 2. Retrograde right vertebral artery flow. This may be due to concurrent stenosis or occlusion in the right brachiocephalic artery. 3. Moderate heterogeneous and irregular/ulcerated atherosclerotic plaque in the proximal left internal carotid artery. Elevation of the peak systolic velocity is likely greater than expected for the degree of stenosis secondary to the contralateral common carotid artery occlusion. Please refer to recent CT arteriogram of the neck for degree of stenosis. 4. Left vertebral artery flow is antegrade. Electronically Signed   By: Jacqulynn Cadet M.D.   On: 04/05/2022 15:56   PERIPHERAL VASCULAR CATHETERIZATION  Result Date: 04/05/2022 See surgical note for result.  ECHOCARDIOGRAM COMPLETE  Result Date: 04/05/2022    ECHOCARDIOGRAM REPORT   Patient Name:   AMAR KEENUM Date of Exam: 04/05/2022 Medical Rec #:  [id number]      Height:       60.0 in Accession #:    [id number]     Weight:       140.0 lb Date of Birth:  25-Oct-1951       BSA:          1.604 m Patient Age:    71 years       BP:           155/53 mmHg Patient Gender: F              HR:           82 bpm. Exam Location:  [facility] Procedure: 2D Echo, Color Doppler and Cardiac Doppler Indications:     I63.9 Stroke  History:         Patient has prior history of Echocardiogram examinations, most                  recent 06/24/2021. COPD; Risk Factors:Hypertension, Diabetes,                  Dyslipidemia and Sleep Apnea.  Sonographer:     Charmayne Sheer Referring Phys:  [id number] Athena Masse Diagnosing Phys: Isaias Cowman MD IMPRESSIONS  1. Left ventricular ejection fraction, by estimation,  is 60 to 65%. The left ventricle has normal function. The left ventricle has no regional wall motion abnormalities. Left ventricular diastolic parameters were normal.  2. Right ventricular systolic  function is normal. The right ventricular size is normal.  3. The mitral valve is normal in structure. Moderate mitral valve regurgitation. No evidence of mitral stenosis.  4. The aortic valve is normal in structure. Aortic valve regurgitation is mild. No aortic stenosis is present.  5. The inferior vena cava is normal in size with greater than 50% respiratory variability, suggesting right atrial pressure of 3 mmHg. FINDINGS  Left Ventricle: Left ventricular ejection fraction, by estimation, is 60 to 65%. The left ventricle has normal function. The left ventricle has no regional wall motion abnormalities. The left ventricular internal cavity size was normal in size. There is  no left ventricular hypertrophy. Left ventricular diastolic parameters were normal. Right Ventricle: The right ventricular size is normal. No increase in right ventricular wall thickness. Right ventricular systolic function is normal. Left Atrium: Left atrial size was normal in size. Right Atrium: Right atrial size was normal in size. Pericardium: There is no evidence of pericardial effusion. Mitral Valve: The mitral valve is normal in structure. Moderate mitral valve regurgitation. No evidence of mitral valve stenosis. MV peak gradient, 8.0 mmHg. The mean mitral valve gradient is 4.0 mmHg. Tricuspid Valve: The tricuspid valve is normal in structure. Tricuspid valve regurgitation is mild . No evidence of tricuspid stenosis. Aortic Valve: The aortic valve is normal in structure. Aortic valve regurgitation is mild. Aortic regurgitation PHT measures 532 msec. No aortic stenosis is present. Aortic valve mean gradient measures 3.0 mmHg. Aortic valve peak gradient measures 4.9 mmHg. Aortic valve area, by VTI measures 1.17 cm. Pulmonic Valve: The pulmonic valve was normal in structure. Pulmonic valve regurgitation is not visualized. No evidence of pulmonic stenosis. Aorta: The aortic root is normal in size and structure. Venous: The inferior vena  cava is normal in size with greater than 50% respiratory variability, suggesting right atrial pressure of 3 mmHg. IAS/Shunts: No atrial level shunt detected by color flow Doppler.  LEFT VENTRICLE PLAX 2D LVIDd:         4.06 cm   Diastology LVIDs:         2.46 cm   LV e' medial:    5.98 cm/s LV PW:         0.97 cm   LV E/e' medial:  16.4 LV IVS:        0.86 cm   LV e' lateral:   7.40 cm/s LVOT diam:     1.40 cm   LV E/e' lateral: 13.3 LV SV:         30 LV SV Index:   19 LVOT Area:     1.54 cm  RIGHT VENTRICLE RV Basal diam:  2.54 cm RV S prime:     10.20 cm/s LEFT ATRIUM             Index        RIGHT ATRIUM           Index LA diam:        3.50 cm 2.18 cm/m   RA Area:     12.30 cm LA Vol (A2C):   45.0 ml 28.05 ml/m  RA Volume:   25.20 ml  15.71 ml/m LA Vol (A4C):   37.1 ml 23.13 ml/m LA Biplane Vol: 42.6 ml 26.56 ml/m  AORTIC VALVE  PULMONIC VALVE AV Area (Vmax):    1.30 cm     PV Vmax:       0.77 m/s AV Area (Vmean):   1.09 cm     PV Vmean:      52.200 cm/s AV Area (VTI):     1.17 cm     PV VTI:        0.158 m AV Vmax:           111.00 cm/s  PV Peak grad:  2.4 mmHg AV Vmean:          85.400 cm/s  PV Mean grad:  1.0 mmHg AV VTI:            0.260 m AV Peak Grad:      4.9 mmHg AV Mean Grad:      3.0 mmHg LVOT Vmax:         94.00 cm/s LVOT Vmean:        60.700 cm/s LVOT VTI:          0.197 m LVOT/AV VTI ratio: 0.76 AI PHT:            532 msec  AORTA Ao Root diam: 2.80 cm MITRAL VALVE MV Area (PHT): 7.09 cm    SHUNTS MV Area VTI:   0.95 cm    Systemic VTI:  0.20 m MV Peak grad:  8.0 mmHg    Systemic Diam: 1.40 cm MV Mean grad:  4.0 mmHg MV Vmax:       1.41 m/s MV Vmean:      91.5 cm/s MV Decel Time: 107 msec MV E velocity: 98.10 cm/s MV A velocity: 99.00 cm/s MV E/A ratio:  0.99 Isaias Cowman MD Electronically signed by Isaias Cowman MD Signature Date/Time: 04/05/2022/1:24:54 PM    Final    DG Hip Unilat With Pelvis 2-3 Views Left  Result Date: 04/03/2022 CLINICAL DATA:   Weakness EXAM: DG HIP (WITH OR WITHOUT PELVIS) 2-3V LEFT COMPARISON:  None Available. FINDINGS: Limited evaluation due to overlapping osseous structures and overlying soft tissues. There is no evidence of hip fracture or dislocation of the left hip. No acute displaced fracture or diastasis of the bones of the pelvis. Right hip grossly unremarkable. There is no evidence of arthropathy or other focal bone abnormality. Surgical tacks overlie the pelvis. Atherosclerotic plaque.  Aorto bi-iliac artery stent. IMPRESSION: 1. Negative for acute traumatic injury. 2. Aortic Atherosclerosis (ICD10-I70.0) in a patient status post aorto bi-iliac artery stent. Electronically Signed   By: Iven Finn M.D.   On: 04/03/2022 20:00    EKG: Normal sinus rhythm with LVH and repolarization change  ASSESSMENT AND PLAN:   1.  Preoperative cardiovascular evaluation.  The patient has profound vascular and cerebrovascular disease, awaiting left femoral endarterectomy, left iliac stent, and right carotid endarterectomy, currently chest pain-free, with normal ECG, without prior history of myocardial infarction or congestive heart failure, with 2D echocardiogram revealing normal left ventricular function.  The patient is at mild to moderate risk for serious cardiovascular complication primarily due to her comorbidities.  The patient appears to be medically optimized, currently on aspirin, clopidogrel, atorvastatin and metoprolol tartrate. 2.  Critical limb ischemia, awaiting left normal endarterectomy and left iliac stent 3.  Bilateral carotid disease, multiple prior CVAs, awaiting left carotid endarterectomy 4.  Essential hypertension, blood pressure well controlled, on metoprolol tartrate  Recommendations  1.  Agree with overall current therapy 2.  Proceed with planned surgery as scheduled 3.  Defer further cardiac diagnostics 4.  Continue metoprolol to tartrate pre-, peri and postoperatively 5.  Obtain postoperative  ECG  Signed: Isaias Cowman MD,PhD, [facility] 04/06/2022, 8:30 AM

## 2022-04-06 NOTE — Progress Notes (Signed)
PROGRESS NOTE  Autumn Arnold NFA:[id number] DOB: 10-26-1951   PCP: [name], MD  Patient is from: Home.  Lives with husband.  DOA: 04/04/2022 LOS: 2  Chief complaints Chief Complaint  Patient presents with   Altered Mental Status     Brief Narrative / Interim history: 71 year old F with PMH of CVA, OSA, DM-2, HTN, PVD s/p aortoiliac and SMA stent on DAPT and mild cognitive deficits, anxiety and tobacco use disorder presenting with altered mental status, blurry vision, aphasia and left arm numbness and weakness, and admitted for CVA evaluation.  She was hypotensive to 70s/40s.  MRI brain concerning for right frontoparietal acute to early subacute ischemic CVA.  Reportedly, her symptoms resolved in ED except for some residual left arm weakness and numbness.  There was also concern about critical left limb ischemia.  Neurology and vascular surgery consulted.  The next day, CTA head and neck.  Showed unchanged occlusion of right common carotid and plaque at the proximal right and left ICA causing less than 50% stenosis.  No new occlusion or hemodynamically significant stenosis noted.  TTE, carotid US and lower extremity angiography pending.  TTE with without significant finding.  Angiograph showed occluded right CCA filling through collaterals, 80 to 90% stenosis of the right subclavian artery, occluded left common iliac artery, patent right common iliac artery stent.  Vascular surgery recommended cardiology consultation for clearance before vascular intervention.  Cardiology consulted.  Subjective: Seen and examined earlier this morning.  No major events overnight of this morning.  She is complaining of left leg cramping.  Family member at bedside.  Objective: Vitals:   04/06/22 0052 04/06/22 0539 04/06/22 0810 04/06/22 1231  BP: (!) 121/56 118/61 (!) 126/50 (!) 105/49  Pulse: 74 74 77 73  Resp: '18 17 18 18  '$ Temp: 98.4 F (36.9 C) 98.8 F (37.1 C) 98.1 F (36.7 C) 97.9 F  (36.6 C)  TempSrc:   Oral Oral  SpO2: 94% 98% 94% 95%  Weight:      Height:        Examination:  GENERAL: No apparent distress.  Nontoxic. HEENT: MMM.  Vision and hearing grossly intact.  NECK: Supple.  No apparent JVD.  RESP:  No IWOB.  Fair aeration bilaterally. CVS:  RRR. Heart sounds normal.  ABD/GI/GU: BS+. Abd soft, NTND.  MSK/EXT:  Moves extremities. No apparent deformity. No edema.  No palpable DP pulses. SKIN: no apparent skin lesion or wound NEURO: Awake and alert. Oriented appropriately.  No apparent focal neuro deficit other than LLE weakness PSYCH: Calm. Normal affect.   Procedures:  6/1-angiography by vascular surgery.  Microbiology summarized: None  Assessment and Plan: * Acute CVA (cerebrovascular accident) [facility]) MRI concerning for acute to early subacute right frontoparietal CVA.  She seems to have residual left lower extremity weakness.  Per family, speech is back to baseline.  CTA head and neck with chronic right CCA occlusion.  TTE without significant finding.  LDL 64.  A1c 7.6%.   -SBP goal 120s to 140s.  Out of permissive HTN window. -Continue statin, Plavix and aspirin. -Encourage tobacco cessation. -Continue therapy    Carotid artery occlusion with infarction ([place]) Has occluded right common carotid artery with collaterals from external carotid artery. -Continue DAPT and statin. -On IV fluid to avoid hypotension  Hypotension BP was 70/48.  Improved but soft. -Discontinue metoprolol. -IV fluid  Critical limb ischemia of left lower extremity (HCC) PVD s/p aorto iliac and SMA stent 06/2021.  She has some left  foot erythema with no palpable DP or PT pulse on my exam.  She complains cramping pain likely claudication -Plan for lower extremity angiography this afternoon -Continue Plavix, aspirin and atorvastatin. -Pain control with as needed Tylenol, tramadol and oxycodone -Encouraged smoking cessation  Chronic anemia Recent Labs     06/25/21 0644 06/25/21 1550 06/26/21 0500 06/27/21 0338 06/28/21 0545 02/26/22 0200 04/03/22 1930 04/04/22 0614 04/05/22 0553 04/06/22 0612  HGB 7.3* 10.5* 9.1* 9.3* 9.5* 11.5* 11.2* 10.1* 10.2* 10.8*  H&H relatively stable. -Check anemia panel in the morning   Type 2 diabetes mellitus with hyperlipidemia (HCC) A1c 7.6%. Recent Labs  Lab 04/05/22 1158 04/05/22 1515 04/05/22 1844 04/06/22 0800 04/06/22 1204  GLUCAP 118* 104* 84 110* 144*  Continue SSI and statin   Hypomagnesemia Mg 1.7.  IV magnesium sulfate 2 g x 1  COPD (chronic obstructive pulmonary disease) (HCC) Stable.  Over 60-pack-year history.  Currently smokes about a pack a day.  Discussed the importance of smoking cessation.  -Nicotine patch as needed  Tobacco dependence Nicotine patch and tobacco cessation counseling  OSA (obstructive sleep apnea) Continue oxygen at night  Anxiety and depression Stable.  Continue buspirone, sertraline and amitriptyline     DVT prophylaxis:  enoxaparin (LOVENOX) injection 40 mg Start: 04/04/22 0800  Code Status: Full code Family Communication: Updated patient's brother and sister-in-law at bedside. Level of care: Telemetry Medical Status is: Inpatient Remains inpatient appropriate because: Acute CVA and critical left limb ischemia   Final disposition: Home with home health once medically cleared Consultants:  Neurology Vascular surgery Cardiology  Sch Meds:  Scheduled Meds:  aspirin EC  81 mg Oral Daily   atorvastatin  80 mg Oral QPM   busPIRone  5 mg Oral BID   clopidogrel  75 mg Oral Daily   enoxaparin (LOVENOX) injection  40 mg Subcutaneous Q24H   insulin aspart  0-5 Units Subcutaneous QHS   insulin aspart  0-9 Units Subcutaneous TID WC   nicotine  21 mg Transdermal Daily   pantoprazole  40 mg Oral Daily   sertraline  150 mg Oral Daily   Continuous Infusions:  sodium chloride 75 mL/hr at 04/06/22 0653   PRN Meds:.acetaminophen **OR**  acetaminophen (TYLENOL) oral liquid 160 mg/5 mL **OR** acetaminophen, albuterol, HYDROmorphone (DILAUDID) injection, ondansetron (ZOFRAN) IV, oxyCODONE, traMADol  Antimicrobials: Anti-infectives (From admission, onward)    Start     Dose/Rate Route Frequency Ordered Stop   04/05/22 1408  ceFAZolin (ANCEF) IVPB 2g/100 mL premix        2 g 200 mL/hr over 30 Minutes Intravenous 30 min pre-op 04/05/22 1408 04/05/22 1615        I have personally reviewed the following labs and images: CBC: Recent Labs  Lab 04/03/22 1930 04/04/22 0614 04/05/22 0553 04/06/22 0612  WBC 11.8* 8.5 6.7 7.1  NEUTROABS 8.1*  --   --   --   HGB 11.2* 10.1* 10.2* 10.8*  HCT 35.5* 32.1* 31.6* 33.2*  MCV 91.3 91.2 88.8 87.8  PLT 213 183 194 177   BMP &GFR Recent Labs  Lab 04/03/22 1930 04/04/22 0614 04/05/22 0553 04/06/22 0612  NA 136  --  139 136  K 3.9  --  4.6 4.1  CL 102  --  108 105  CO2 24  --  26 26  GLUCOSE 116*  --  131* 122*  BUN 16  --  12 9  CREATININE 0.93 1.08* 0.74 0.70  CALCIUM 9.6  --  8.8* 8.2*  MG  --   --  1.6* 1.7  PHOS  --   --  3.3 3.2   Estimated Creatinine Clearance: 55.2 mL/min (by C-G formula based on SCr of 0.7 mg/dL). Liver & Pancreas: Recent Labs  Lab 04/03/22 1930 04/05/22 0553 04/06/22 0612  AST 15  --   --   ALT 12  --   --   ALKPHOS 89  --   --   BILITOT 0.4  --   --   PROT 7.1  --   --   ALBUMIN 3.7 3.1* 3.2*   No results for input(s): LIPASE, AMYLASE in the last 168 hours. No results for input(s): AMMONIA in the last 168 hours. Diabetic: Recent Labs    04/04/22 0614  HGBA1C 7.6*   Recent Labs  Lab 04/05/22 1158 04/05/22 1515 04/05/22 1844 04/06/22 0800 04/06/22 1204  GLUCAP 118* 104* 84 110* 144*   Cardiac Enzymes: No results for input(s): CKTOTAL, CKMB, CKMBINDEX, TROPONINI in the last 168 hours. No results for input(s): PROBNP in the last 8760 hours. Coagulation Profile: No results for input(s): INR, PROTIME in the last 168  hours. Thyroid Function Tests: No results for input(s): TSH, T4TOTAL, FREET4, T3FREE, THYROIDAB in the last 72 hours. Lipid Profile: Recent Labs    04/04/22 0614  CHOL 149  HDL 38*  LDLCALC 64  TRIG 236*  CHOLHDL 3.9   Anemia Panel: No results for input(s): VITAMINB12, FOLATE, FERRITIN, TIBC, IRON, RETICCTPCT in the last 72 hours. Urine analysis:    Component Value Date/Time   COLORURINE YELLOW (A) 06/22/2021 2052   APPEARANCEUR CLEAR (A) 06/22/2021 2052   LABSPEC 1.010 06/22/2021 2052   PHURINE 6.0 06/22/2021 2052   GLUCOSEU 50 (A) 06/22/2021 2052   HGBUR NEGATIVE 06/22/2021 2052   BILIRUBINUR NEGATIVE 06/22/2021 2052   KETONESUR 5 (A) 06/22/2021 2052   PROTEINUR NEGATIVE 06/22/2021 2052   NITRITE NEGATIVE 06/22/2021 2052   LEUKOCYTESUR NEGATIVE 06/22/2021 2052   Sepsis Labs: Invalid input(s): PROCALCITONIN, [place]  Microbiology: No results found for this or any previous visit (from the past 240 hour(s)).  Radiology Studies: PERIPHERAL VASCULAR CATHETERIZATION  Result Date: 04/05/2022 See surgical note for result.     Isais Klipfel T. [place]  If 7PM-7AM, please contact night-coverage [email or web address] 04/06/2022, 3:41 PM

## 2022-04-07 DIAGNOSIS — I639 Cerebral infarction, unspecified: Secondary | ICD-10-CM | POA: Diagnosis not present

## 2022-04-07 DIAGNOSIS — D509 Iron deficiency anemia, unspecified: Secondary | ICD-10-CM

## 2022-04-07 DIAGNOSIS — I959 Hypotension, unspecified: Secondary | ICD-10-CM | POA: Diagnosis not present

## 2022-04-07 DIAGNOSIS — D649 Anemia, unspecified: Secondary | ICD-10-CM | POA: Diagnosis not present

## 2022-04-07 DIAGNOSIS — I70222 Atherosclerosis of native arteries of extremities with rest pain, left leg: Secondary | ICD-10-CM | POA: Diagnosis not present

## 2022-04-07 LAB — RENAL FUNCTION PANEL
Albumin: 3.1 g/dL — ABNORMAL LOW (ref 3.5–5.0)
Anion gap: 6 (ref 5–15)
BUN: 14 mg/dL (ref 8–23)
CO2: 26 mmol/L (ref 22–32)
Calcium: 8.6 mg/dL — ABNORMAL LOW (ref 8.9–10.3)
Chloride: 104 mmol/L (ref 98–111)
Creatinine, Ser: 0.87 mg/dL (ref 0.44–1.00)
GFR, Estimated: >60 mL/min (ref >60)
Glucose, Bld: 143 mg/dL — ABNORMAL HIGH (ref 70–99)
Phosphorus: 3.9 mg/dL (ref 2.5–4.6)
Potassium: 4.4 mmol/L (ref 3.5–5.1)
Sodium: 136 mmol/L (ref 135–145)

## 2022-04-07 LAB — GLUCOSE, CAPILLARY
Glucose-Capillary: 146 mg/dL — ABNORMAL HIGH (ref 70–99)
Glucose-Capillary: 146 mg/dL — ABNORMAL HIGH (ref 70–99)
Glucose-Capillary: 194 mg/dL — ABNORMAL HIGH (ref 70–99)
Glucose-Capillary: 129 mg/dL — ABNORMAL HIGH (ref 70–99)

## 2022-04-07 LAB — FERRITIN: Ferritin: 40 ng/mL (ref 11–307)

## 2022-04-07 LAB — CBC
HCT: 32.3 % — ABNORMAL LOW (ref 36.0–46.0)
Hemoglobin: 10.7 g/dL — ABNORMAL LOW (ref 12.0–15.0)
MCH: 29.2 pg (ref 26.0–34.0)
MCHC: 33.1 g/dL (ref 30.0–36.0)
MCV: 88 fL (ref 80.0–100.0)
Platelets: 167 10*3/uL (ref 150–400)
RBC: 3.67 MIL/uL — ABNORMAL LOW (ref 3.87–5.11)
RDW: 14.7 % (ref 11.5–15.5)
WBC: 6.9 10*3/uL (ref 4.0–10.5)
nRBC: 0 % (ref 0.0–0.2)

## 2022-04-07 LAB — RETICULOCYTES
Immature Retic Fract: 12.6 % (ref 2.3–15.9)
RBC.: 3.65 MIL/uL — ABNORMAL LOW (ref 3.87–5.11)
Retic Count, Absolute: 53.7 10*3/uL (ref 19.0–186.0)
Retic Ct Pct: 1.5 % (ref 0.4–3.1)

## 2022-04-07 LAB — FOLATE: Folate: 9.2 ng/mL (ref >5.9)

## 2022-04-07 LAB — MAGNESIUM: Magnesium: 1.8 mg/dL (ref 1.7–2.4)

## 2022-04-07 LAB — IRON AND TIBC
Iron: 29 ug/dL (ref 28–170)
Saturation Ratios: 9 % — ABNORMAL LOW (ref 10.4–31.8)
TIBC: 322 ug/dL (ref 250–450)
UIBC: 293 ug/dL

## 2022-04-07 LAB — VITAMIN B12: Vitamin B-12: 292 pg/mL (ref 180–914)

## 2022-04-07 MED ORDER — SODIUM CHLORIDE 0.9 % IV SOLN
250.0000 mg | Freq: Once | INTRAVENOUS | Status: AC
  Administered 2022-04-07: 250 mg via INTRAVENOUS
  Filled 2022-04-07: qty 15

## 2022-04-07 MED ORDER — MAGNESIUM SULFATE 2 GM/50ML IV SOLN
2.0000 g | Freq: Once | INTRAVENOUS | Status: AC
  Administered 2022-04-07: 2 g via INTRAVENOUS
  Filled 2022-04-07: qty 50

## 2022-04-07 MED ORDER — METOPROLOL TARTRATE 25 MG PO TABS
12.5000 mg | ORAL_TABLET | Freq: Two times a day (BID) | ORAL | Status: DC
  Administered 2022-04-07 – 2022-04-13 (×12): 12.5 mg via ORAL
  Filled 2022-04-07 (×14): qty 1

## 2022-04-07 NOTE — Progress Notes (Signed)
PROGRESS NOTE  Autumn Arnold TKP:[id number] DOB: September 20, 1951   PCP: [name], MD  Patient is from: Home.  Lives with husband.  DOA: 04/04/2022 LOS: 3  Chief complaints Chief Complaint  Patient presents with   Altered Mental Status     Brief Narrative / Interim history: 71 year old F with PMH of CVA, OSA, DM-2, HTN, PVD s/p aortoiliac and SMA stent on DAPT and mild cognitive deficits, anxiety and tobacco use disorder presenting with altered mental status, blurry vision, aphasia and left arm numbness and weakness, and admitted for CVA evaluation.  She was hypotensive to 70s/40s.  MRI brain concerning for right frontoparietal acute to early subacute ischemic CVA.  Reportedly, her symptoms resolved in ED except for some residual left arm weakness and numbness.  There was also concern about critical left limb ischemia.  Neurology and vascular surgery consulted.  The next day, CTA head and neck.  Showed unchanged occlusion of right common carotid and plaque at the proximal right and left ICA causing less than 50% stenosis.  No new occlusion or hemodynamically significant stenosis noted.  TTE, carotid US and lower extremity angiography pending.  TTE with without significant finding.  Angiograph showed occluded right CCA filling through collaterals, 80 to 90% stenosis of the right subclavian artery, occluded left common iliac artery, patent right common iliac artery stent.  Cardiology cleared patient for revascularization.  Vascular surgery following.  Subjective: Seen and examined earlier this morning.  No major events overnight of this morning.  No complaints other than some pain in left leg.  Objective: Vitals:   04/06/22 2014 04/07/22 0016 04/07/22 0328 04/07/22 0745  BP: (!) 146/58 (!) 154/56 (!) 127/55 (!) 128/55  Pulse: 85 88 87 91  Resp: '16 16 16 16  '$ Temp: 97.9 F (36.6 C) 98.9 F (37.2 C) 98.1 F (36.7 C) 98.5 F (36.9 C)  TempSrc: Oral   Oral  SpO2: 94% 92% 95% 96%   Weight:      Height:        Examination:  GENERAL: No apparent distress.  Nontoxic. HEENT: MMM.  Vision and hearing grossly intact.  NECK: Supple.  No apparent JVD.  RESP:  No IWOB.  Fair aeration bilaterally. CVS:  RRR. Heart sounds normal.  ABD/GI/GU: BS+. Abd soft, NTND.  MSK/EXT:  Moves extremities. No apparent deformity. No edema.  No palpable DP pulses. SKIN: no apparent skin lesion or wound NEURO: Awake and alert. Oriented appropriately.  No apparent focal neuro deficit other than some LLE weakness. PSYCH: Calm. Normal affect.   Procedures:  6/1-angiography by vascular surgery.  Microbiology summarized: None  Assessment and Plan: * Acute CVA (cerebrovascular accident) [facility]) MRI concerning for acute to early subacute right frontoparietal CVA.  She seems to have residual left lower extremity weakness.  Per family, speech is back to baseline.  CTA head and neck with chronic right CCA occlusion.  TTE without significant finding.  LDL 64.  A1c 7.6%.   -SBP goal 120s to 140s.  Out of permissive HTN window. -Continue statin, Plavix and aspirin. -Encourage tobacco cessation. -Continue therapy    Carotid artery occlusion with infarction ([place]) Has occluded right common carotid artery with collaterals from external carotid artery. -Continue DAPT and statin. -On IV fluid to avoid hypotension -Vascular surgery on board.  Hypotension BP was 70/48.  Resolved. -Continue reduced dose of metoprolol -Continue IV fluid  Critical limb ischemia of left lower extremity (HCC) PVD s/p aorto iliac and SMA stent 06/2021.  She has some  left foot erythema with no palpable DP or PT pulse on my exam. Angiograph showed occluded right CCA filling through collaterals, 80 to 90% stenosis of the right subclavian artery, occluded left common iliac artery, patent right common iliac artery stent. -Continue Plavix, aspirin and atorvastatin. -Pain control with as needed Tylenol, tramadol and  oxycodone -Encouraged smoking cessation -Cardiology cleared patient for revascularization.  Vascular surgery to decide timing  Iron deficiency anemia Recent Labs    06/25/21 1550 06/26/21 0500 06/27/21 0338 06/28/21 0545 02/26/22 0200 04/03/22 1930 04/04/22 0614 04/05/22 0553 04/06/22 0612 04/07/22 0447  HGB 10.5* 9.1* 9.3* 9.5* 11.5* 11.2* 10.1* 10.2* 10.8* 10.7*  H&H relatively stable.  No report of melena or hematochezia.  Anemia panel suggests iron deficiency. -IV ferric gluconate 250 mg x 1 -Monitor H&H   Type 2 diabetes mellitus with hyperlipidemia (HCC) A1c 7.6%. Recent Labs  Lab 04/06/22 1204 04/06/22 1658 04/06/22 2016 04/07/22 0746 04/07/22 1156  GLUCAP 144* 121* 121* 146* 146*  Continue SSI and statin   Hypomagnesemia Mg 1.8.  IV magnesium sulfate 2 g x 1  COPD (chronic obstructive pulmonary disease) (HCC) Stable.  Over 60-pack-year history.  Currently smokes about a pack a day.  Discussed the importance of smoking cessation.  -Nicotine patch as needed  Tobacco dependence Nicotine patch and tobacco cessation counseling  OSA (obstructive sleep apnea) Continue oxygen at night  Anxiety and depression Stable.  Continue buspirone, sertraline and amitriptyline     DVT prophylaxis:  enoxaparin (LOVENOX) injection 40 mg Start: 04/04/22 0800  Code Status: Full code Family Communication: None at bedside. Level of care: Telemetry Medical Status is: Inpatient Remains inpatient appropriate because: Acute CVA and critical left limb ischemia   Final disposition: Home with home health once medically cleared Consultants:  Neurology-signed off Vascular surgery-following Cardiology-signed off  Sch Meds:  Scheduled Meds:  aspirin EC  81 mg Oral Daily   atorvastatin  80 mg Oral QPM   busPIRone  5 mg Oral BID   clopidogrel  75 mg Oral Daily   enoxaparin (LOVENOX) injection  40 mg Subcutaneous Q24H   insulin aspart  0-5 Units Subcutaneous QHS    insulin aspart  0-9 Units Subcutaneous TID WC   metoprolol tartrate  12.5 mg Oral BID   nicotine  21 mg Transdermal Daily   pantoprazole  40 mg Oral Daily   sertraline  150 mg Oral Daily   Continuous Infusions:  sodium chloride 75 mL/hr at 04/07/22 0639   magnesium sulfate bolus IVPB     PRN Meds:.acetaminophen **OR** acetaminophen (TYLENOL) oral liquid 160 mg/5 mL **OR** acetaminophen, albuterol, HYDROmorphone (DILAUDID) injection, ondansetron (ZOFRAN) IV, oxyCODONE, traMADol  Antimicrobials: Anti-infectives (From admission, onward)    Start     Dose/Rate Route Frequency Ordered Stop   04/05/22 1408  ceFAZolin (ANCEF) IVPB 2g/100 mL premix        2 g 200 mL/hr over 30 Minutes Intravenous 30 min pre-op 04/05/22 1408 04/05/22 1615        I have personally reviewed the following labs and images: CBC: Recent Labs  Lab 04/03/22 1930 04/04/22 0614 04/05/22 0553 04/06/22 0612 04/07/22 0447  WBC 11.8* 8.5 6.7 7.1 6.9  NEUTROABS 8.1*  --   --   --   --   HGB 11.2* 10.1* 10.2* 10.8* 10.7*  HCT 35.5* 32.1* 31.6* 33.2* 32.3*  MCV 91.3 91.2 88.8 87.8 88.0  PLT 213 183 194 177 167   BMP &GFR Recent Labs  Lab 04/03/22 1930 04/04/22 9509  04/05/22 0553 04/06/22 0612 04/07/22 0447  NA 136  --  139 136 136  K 3.9  --  4.6 4.1 4.4  CL 102  --  108 105 104  CO2 24  --  '26 26 26  '$ GLUCOSE 116*  --  131* 122* 143*  BUN 16  --  '12 9 14  '$ CREATININE 0.93 1.08* 0.74 0.70 0.87  CALCIUM 9.6  --  8.8* 8.2* 8.6*  MG  --   --  1.6* 1.7 1.8  PHOS  --   --  3.3 3.2 3.9   Estimated Creatinine Clearance: 50.8 mL/min (by C-G formula based on SCr of 0.87 mg/dL). Liver & Pancreas: Recent Labs  Lab 04/03/22 1930 04/05/22 0553 04/06/22 0612 04/07/22 0447  AST 15  --   --   --   ALT 12  --   --   --   ALKPHOS 89  --   --   --   BILITOT 0.4  --   --   --   PROT 7.1  --   --   --   ALBUMIN 3.7 3.1* 3.2* 3.1*   No results for input(s): LIPASE, AMYLASE in the last 168 hours. No results  for input(s): AMMONIA in the last 168 hours. Diabetic: No results for input(s): HGBA1C in the last 72 hours.  Recent Labs  Lab 04/06/22 1204 04/06/22 1658 04/06/22 2016 04/07/22 0746 04/07/22 1156  GLUCAP 144* 121* 121* 146* 146*   Cardiac Enzymes: No results for input(s): CKTOTAL, CKMB, CKMBINDEX, TROPONINI in the last 168 hours. No results for input(s): PROBNP in the last 8760 hours. Coagulation Profile: No results for input(s): INR, PROTIME in the last 168 hours. Thyroid Function Tests: No results for input(s): TSH, T4TOTAL, FREET4, T3FREE, THYROIDAB in the last 72 hours. Lipid Profile: No results for input(s): CHOL, HDL, LDLCALC, TRIG, CHOLHDL, LDLDIRECT in the last 72 hours.  Anemia Panel: Recent Labs    04/07/22 0447  VITAMINB12 292  FOLATE 9.2  FERRITIN 40  TIBC 322  IRON 29  RETICCTPCT 1.5   Urine analysis:    Component Value Date/Time   COLORURINE YELLOW (A) 06/22/2021 2052   APPEARANCEUR CLEAR (A) 06/22/2021 2052   LABSPEC 1.010 06/22/2021 2052   PHURINE 6.0 06/22/2021 2052   GLUCOSEU 50 (A) 06/22/2021 2052   HGBUR NEGATIVE 06/22/2021 2052   BILIRUBINUR NEGATIVE 06/22/2021 2052   KETONESUR 5 (A) 06/22/2021 2052   PROTEINUR NEGATIVE 06/22/2021 2052   NITRITE NEGATIVE 06/22/2021 2052   LEUKOCYTESUR NEGATIVE 06/22/2021 2052   Sepsis Labs: Invalid input(s): PROCALCITONIN, [place]  Microbiology: No results found for this or any previous visit (from the past 240 hour(s)).  Radiology Studies: No results found.    Kaylon Hitz T. [place]  If 7PM-7AM, please contact night-coverage [email or web address] 04/07/2022, 12:48 PM

## 2022-04-08 ENCOUNTER — Encounter: Payer: Self-pay | Admitting: Student

## 2022-04-08 DIAGNOSIS — D649 Anemia, unspecified: Secondary | ICD-10-CM | POA: Diagnosis not present

## 2022-04-08 DIAGNOSIS — I959 Hypotension, unspecified: Secondary | ICD-10-CM | POA: Diagnosis not present

## 2022-04-08 DIAGNOSIS — I639 Cerebral infarction, unspecified: Secondary | ICD-10-CM | POA: Diagnosis not present

## 2022-04-08 DIAGNOSIS — I70222 Atherosclerosis of native arteries of extremities with rest pain, left leg: Secondary | ICD-10-CM | POA: Diagnosis not present

## 2022-04-08 LAB — GLUCOSE, CAPILLARY
Glucose-Capillary: 128 mg/dL — ABNORMAL HIGH (ref 70–99)
Glucose-Capillary: 88 mg/dL (ref 70–99)
Glucose-Capillary: 150 mg/dL — ABNORMAL HIGH (ref 70–99)
Glucose-Capillary: 223 mg/dL — ABNORMAL HIGH (ref 70–99)

## 2022-04-08 NOTE — Plan of Care (Signed)
TTE revealed LVEF of 60 to 65%. No mural thrombus or valvular vegetation mentioned in the report.   Carotid ultrasound revealed the following: 1. Right common carotid artery occlusion with distal reconstitution via retrograde flow through the external carotid artery. 2. Retrograde right vertebral artery flow. This may be due to concurrent stenosis or occlusion in the right brachiocephalic artery. 3. Moderate heterogeneous and irregular/ulcerated atherosclerotic plaque in the proximal left internal carotid artery. Elevation of the peak systolic velocity is likely greater than expected for the degree of stenosis secondary to the contralateral common carotid artery occlusion. Please refer to recent CT arteriogram of the neck for degree of stenosis. 4. Left vertebral artery flow is antegrade.  A/R: 71 y.o. female with a PMHx of stroke in 2011 without residual deficit, HTN, HLD, DM, anxiety, COPD, PVD (on Plavix) and status post angioplasty and stent placement in the left common iliac in August 2022 who presented to the ED on Tuesday evening with transient right eye blurred vision and some difficulty with word-finding in conjunction with some left arm weakness with numbness, lasting for 2 hours prior to resolving spontaneously. The patient's symptoms and risk factors militate in favor of a right MCA territory TIA as the etiology for her presentation.  - Her right monocular visual blurring suggests possible embolization to the retina of her right eye, most likely from a right ICA source.  - CTA and carotid ultrasound studies demonstrate right CCA occlusion with distal reconstitution via retrograde flow through the external carotid artery.  She is a vasculopath with complicated patterns of arterial supply to the brain due to atherosclerotic disease and the chronic right CCA occlusion as well as retrograde right vertebral artery flow demonstrated on carotid ultrasound.  - Will need to continue ASA,  Plavix and atorvastatin indefinietly.  - Will defer to Vascular Surgery regarding diagnosis and management of possible right subclavian steal syndrome given the retrograde right vertebral artery flow noted on ultrasound.  - Will need close outpatient Neurology follow up.  - Keep well-hydrated. Avoid hypotension.  - Please call the Neurohospitalist service with any additional questions.   Electronically signed: Dr. Kerney Elbe

## 2022-04-08 NOTE — Progress Notes (Signed)
PROGRESS NOTE  Autumn Arnold OXB:[id number] DOB: 05-08-1951   PCP: [name], MD  Patient is from: Home.  Lives with husband.  DOA: 04/04/2022 LOS: 4  Chief complaints Chief Complaint  Patient presents with   Altered Mental Status     Brief Narrative / Interim history: 71 year old F with PMH of CVA, OSA, DM-2, HTN, PVD s/p aortoiliac and SMA stent on DAPT and mild cognitive deficits, anxiety and tobacco use disorder presenting with altered mental status, blurry vision, aphasia and left arm numbness and weakness, and admitted for CVA evaluation.  She was hypotensive to 70s/40s.  MRI brain concerning for right frontoparietal acute to early subacute ischemic CVA.  Reportedly, her symptoms resolved in ED except for some residual left arm weakness and numbness.  There was also concern about critical left limb ischemia.  Neurology and vascular surgery consulted.  The next day, CTA head and neck.  Showed unchanged occlusion of right common carotid and plaque at the proximal right and left ICA causing less than 50% stenosis.  No new occlusion or hemodynamically significant stenosis noted.  TTE, carotid US and lower extremity angiography pending.  TTE with without significant finding.  Angiograph showed occluded right CCA filling through collaterals, 80 to 90% stenosis of the right subclavian artery, occluded left common iliac artery, patent right common iliac artery stent.  Cardiology cleared patient for revascularization.  Vascular surgery following.  Subjective: Seen and examined earlier this morning.  No major events overnight of this morning.  No complaints other than LLE pain with weightbearing.  Sister and niece at bedside.  Objective: Vitals:   04/08/22 0004 04/08/22 0452 04/08/22 0810 04/08/22 1231  BP: 135/65 (!) 150/84 (!) 144/55 (!) 113/48  Pulse: 77 78 76 77  Resp: '18 15 16 16  '$ Temp:  97.6 F (36.4 C) 98.5 F (36.9 C) 98.2 F (36.8 C)  TempSrc:  Oral    SpO2: 94% 99%  96% 98%  Weight:      Height:        Examination:  GENERAL: No apparent distress.  Nontoxic. HEENT: MMM.  Vision and hearing grossly intact.  NECK: Supple.  No apparent JVD.  RESP:  No IWOB.  Fair aeration bilaterally. CVS:  RRR. Heart sounds normal.  ABD/GI/GU: BS+. Abd soft, NTND.  MSK/EXT:  Moves extremities. No apparent deformity. No edema.  No palpable DP pulses. SKIN: Slight erythema in left toes NEURO: Awake and alert. Oriented appropriately.  No apparent focal neuro deficit other than some LLE weakness. PSYCH: Calm. Normal affect.   Procedures:  6/1-angiography by vascular surgery.  Microbiology summarized: None  Assessment and Plan: * Acute CVA (cerebrovascular accident) [facility]) MRI concerning for acute to early subacute right frontoparietal CVA.  She seems to have residual left lower extremity weakness.  Per family, speech is back to baseline.  CTA head and neck with chronic right CCA occlusion.  TTE without significant finding.  LDL 64.  A1c 7.6%.   -SBP goal 120s to 140s.  Out of permissive HTN window. -Continue statin, Plavix and aspirin. -Encourage tobacco cessation. -Continue therapy    Carotid artery occlusion with infarction ([place]) Has occluded right common carotid artery with collaterals from external carotid artery. -Continue DAPT and statin. -On IV fluid to avoid hypotension -Vascular surgery on board.  Hypotension BP was 70/48.  Resolved. -Continue reduced dose of metoprolol -Continue IV fluid  Critical limb ischemia of left lower extremity (HCC) PVD s/p aorto iliac and SMA stent 06/2021.  She has some  left foot erythema with no palpable DP or PT pulse on my exam. Angiograph showed occluded right CCA filling through collaterals, 80 to 90% stenosis of the right subclavian artery, occluded left common iliac artery, patent right common iliac artery stent. -Continue Plavix, aspirin and atorvastatin. -Pain control with as needed Tylenol, tramadol and  oxycodone -Encouraged smoking cessation -Cardiology cleared patient for revascularization.  Vascular surgery to decide timing  Iron deficiency anemia Recent Labs    06/25/21 1550 06/26/21 0500 06/27/21 0338 06/28/21 0545 02/26/22 0200 04/03/22 1930 04/04/22 0614 04/05/22 0553 04/06/22 0612 04/07/22 0447  HGB 10.5* 9.1* 9.3* 9.5* 11.5* 11.2* 10.1* 10.2* 10.8* 10.7*  H&H relatively stable.  No report of melena or hematochezia.  Anemia panel suggests iron deficiency. -IV ferric gluconate 250 mg x 1 -Monitor H&H   Type 2 diabetes mellitus with hyperlipidemia (HCC) A1c 7.6%. Recent Labs  Lab 04/07/22 1156 04/07/22 1541 04/07/22 2154 04/08/22 0811 04/08/22 1230  GLUCAP 146* 194* 129* 128* 88  Continue SSI and statin   Hypomagnesemia Monitor replenish as appropriate  COPD (chronic obstructive pulmonary disease) (HCC) Stable.  Over 60-pack-year history.  Currently smokes about a pack a day.  Discussed the importance of smoking cessation.  -Nicotine patch as needed  Tobacco dependence Nicotine patch and tobacco cessation counseling  OSA (obstructive sleep apnea) Continue oxygen at night  Anxiety and depression Stable.  Continue buspirone, sertraline and amitriptyline     DVT prophylaxis:  enoxaparin (LOVENOX) injection 40 mg Start: 04/04/22 0800  Code Status: Full code Family Communication: None at bedside. Level of care: Telemetry Medical Status is: Inpatient Remains inpatient appropriate because: Acute CVA and critical left limb ischemia   Final disposition: Home with home health once medically cleared Consultants:  Neurology-signed off Cardiology-signed off Vascular surgery-following  Sch Meds:  Scheduled Meds:  aspirin EC  81 mg Oral Daily   atorvastatin  80 mg Oral QPM   busPIRone  5 mg Oral BID   clopidogrel  75 mg Oral Daily   enoxaparin (LOVENOX) injection  40 mg Subcutaneous Q24H   insulin aspart  0-5 Units Subcutaneous QHS   insulin  aspart  0-9 Units Subcutaneous TID WC   metoprolol tartrate  12.5 mg Oral BID   nicotine  21 mg Transdermal Daily   pantoprazole  40 mg Oral Daily   sertraline  150 mg Oral Daily   Continuous Infusions:  sodium chloride 75 mL/hr at 04/07/22 1637   PRN Meds:.acetaminophen **OR** acetaminophen (TYLENOL) oral liquid 160 mg/5 mL **OR** acetaminophen, albuterol, HYDROmorphone (DILAUDID) injection, ondansetron (ZOFRAN) IV, oxyCODONE, traMADol  Antimicrobials: Anti-infectives (From admission, onward)    Start     Dose/Rate Route Frequency Ordered Stop   04/05/22 1408  ceFAZolin (ANCEF) IVPB 2g/100 mL premix        2 g 200 mL/hr over 30 Minutes Intravenous 30 min pre-op 04/05/22 1408 04/05/22 1615        I have personally reviewed the following labs and images: CBC: Recent Labs  Lab 04/03/22 1930 04/04/22 0614 04/05/22 0553 04/06/22 0612 04/07/22 0447  WBC 11.8* 8.5 6.7 7.1 6.9  NEUTROABS 8.1*  --   --   --   --   HGB 11.2* 10.1* 10.2* 10.8* 10.7*  HCT 35.5* 32.1* 31.6* 33.2* 32.3*  MCV 91.3 91.2 88.8 87.8 88.0  PLT 213 183 194 177 167   BMP &GFR Recent Labs  Lab 04/03/22 1930 04/04/22 0614 04/05/22 0553 04/06/22 0612 04/07/22 0447  NA 136  --  139 136  136  K 3.9  --  4.6 4.1 4.4  CL 102  --  108 105 104  CO2 24  --  '26 26 26  '$ GLUCOSE 116*  --  131* 122* 143*  BUN 16  --  '12 9 14  '$ CREATININE 0.93 1.08* 0.74 0.70 0.87  CALCIUM 9.6  --  8.8* 8.2* 8.6*  MG  --   --  1.6* 1.7 1.8  PHOS  --   --  3.3 3.2 3.9   Estimated Creatinine Clearance: 50.8 mL/min (by C-G formula based on SCr of 0.87 mg/dL). Liver & Pancreas: Recent Labs  Lab 04/03/22 1930 04/05/22 0553 04/06/22 0612 04/07/22 0447  AST 15  --   --   --   ALT 12  --   --   --   ALKPHOS 89  --   --   --   BILITOT 0.4  --   --   --   PROT 7.1  --   --   --   ALBUMIN 3.7 3.1* 3.2* 3.1*   No results for input(s): LIPASE, AMYLASE in the last 168 hours. No results for input(s): AMMONIA in the last 168  hours. Diabetic: No results for input(s): HGBA1C in the last 72 hours.  Recent Labs  Lab 04/07/22 1156 04/07/22 1541 04/07/22 2154 04/08/22 0811 04/08/22 1230  GLUCAP 146* 194* 129* 128* 88   Cardiac Enzymes: No results for input(s): CKTOTAL, CKMB, CKMBINDEX, TROPONINI in the last 168 hours. No results for input(s): PROBNP in the last 8760 hours. Coagulation Profile: No results for input(s): INR, PROTIME in the last 168 hours. Thyroid Function Tests: No results for input(s): TSH, T4TOTAL, FREET4, T3FREE, THYROIDAB in the last 72 hours. Lipid Profile: No results for input(s): CHOL, HDL, LDLCALC, TRIG, CHOLHDL, LDLDIRECT in the last 72 hours.  Anemia Panel: Recent Labs    04/07/22 0447  VITAMINB12 292  FOLATE 9.2  FERRITIN 40  TIBC 322  IRON 29  RETICCTPCT 1.5   Urine analysis:    Component Value Date/Time   COLORURINE YELLOW (A) 06/22/2021 2052   APPEARANCEUR CLEAR (A) 06/22/2021 2052   LABSPEC 1.010 06/22/2021 2052   PHURINE 6.0 06/22/2021 2052   GLUCOSEU 50 (A) 06/22/2021 2052   HGBUR NEGATIVE 06/22/2021 2052   BILIRUBINUR NEGATIVE 06/22/2021 2052   KETONESUR 5 (A) 06/22/2021 2052   PROTEINUR NEGATIVE 06/22/2021 2052   NITRITE NEGATIVE 06/22/2021 2052   LEUKOCYTESUR NEGATIVE 06/22/2021 2052   Sepsis Labs: Invalid input(s): PROCALCITONIN, [place]  Microbiology: No results found for this or any previous visit (from the past 240 hour(s)).  Radiology Studies: No results found.    Alyssamarie Mounsey T. [place]  If 7PM-7AM, please contact night-coverage [email or web address] 04/08/2022, 2:43 PM

## 2022-04-09 DIAGNOSIS — I639 Cerebral infarction, unspecified: Secondary | ICD-10-CM | POA: Diagnosis not present

## 2022-04-09 LAB — CBC
HCT: 36.9 % (ref 36.0–46.0)
Hemoglobin: 11.9 g/dL — ABNORMAL LOW (ref 12.0–15.0)
MCH: 28.5 pg (ref 26.0–34.0)
MCHC: 32.2 g/dL (ref 30.0–36.0)
MCV: 88.3 fL (ref 80.0–100.0)
Platelets: 190 10*3/uL (ref 150–400)
RBC: 4.18 MIL/uL (ref 3.87–5.11)
RDW: 14.6 % (ref 11.5–15.5)
WBC: 7.2 10*3/uL (ref 4.0–10.5)
nRBC: 0 % (ref 0.0–0.2)

## 2022-04-09 LAB — RENAL FUNCTION PANEL
Albumin: 3.2 g/dL — ABNORMAL LOW (ref 3.5–5.0)
Anion gap: 8 (ref 5–15)
BUN: 12 mg/dL (ref 8–23)
CO2: 23 mmol/L (ref 22–32)
Calcium: 8.7 mg/dL — ABNORMAL LOW (ref 8.9–10.3)
Chloride: 108 mmol/L (ref 98–111)
Creatinine, Ser: 0.7 mg/dL (ref 0.44–1.00)
GFR, Estimated: >60 mL/min (ref >60)
Glucose, Bld: 140 mg/dL — ABNORMAL HIGH (ref 70–99)
Phosphorus: 3.8 mg/dL (ref 2.5–4.6)
Potassium: 4.5 mmol/L (ref 3.5–5.1)
Sodium: 139 mmol/L (ref 135–145)

## 2022-04-09 LAB — GLUCOSE, CAPILLARY
Glucose-Capillary: 150 mg/dL — ABNORMAL HIGH (ref 70–99)
Glucose-Capillary: 158 mg/dL — ABNORMAL HIGH (ref 70–99)
Glucose-Capillary: 133 mg/dL — ABNORMAL HIGH (ref 70–99)
Glucose-Capillary: 210 mg/dL — ABNORMAL HIGH (ref 70–99)

## 2022-04-09 LAB — MAGNESIUM: Magnesium: 1.7 mg/dL (ref 1.7–2.4)

## 2022-04-09 MED ORDER — GLYCERIN (LAXATIVE) 2 G RE SUPP: 1.0000 | Freq: Every day | RECTAL | Status: DC | PRN

## 2022-04-09 MED ORDER — POLYETHYLENE GLYCOL 3350 17 G PO PACK
17.0000 g | PACK | Freq: Every day | ORAL | Status: DC
  Administered 2022-04-09 – 2022-04-13 (×4): 17 g via ORAL
  Filled 2022-04-09 (×4): qty 1

## 2022-04-09 NOTE — Progress Notes (Signed)
Occupational Therapy Treatment Patient Details Name: Autumn Arnold MRN: [id number] DOB: [birth year] Today's Date: 04/09/2022   History of present illness 71 y.o. 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 She also had numbness and weakness of the left arm - imaging revealed small acute/sub-acute CVA.   OT comments  Upon entering the room, pt supine in bed and sleeping soundly with family present in the room. Pt is agreeable to OT intervention with focus on medication management. Pt performs bed mobility without physical assistance but increased time to sit on EOB. Pt given pill box test during session which allows pt's 5 minutes to placed medications into a pill box for the week. Pt taking ~ 15 minutes to complete the task with several errors in which pt missed several days of medications, places them in the wrong spots, and then became confused when looking a medication she had already placed into pillbox but was unsure if she needed to distribute more. Family present to observe and OT recommended family assist with medications at discharge and they agreed. Pt declined toileting needs and requests to remain seated on EOB to visit with family.    Recommendations for follow up therapy are one component of a multi-disciplinary discharge planning process, led by the attending physician.  Recommendations may be updated based on patient status, additional functional criteria and insurance authorization.    Follow Up Recommendations  Home health OT    Assistance Recommended at Discharge Frequent or  constant Supervision/Assistance  Patient can return home with the following  A little help with walking and/or transfers;A little help with bathing/dressing/bathroom;Direct supervision/assist for medications management;Direct supervision/assist for financial management;Assistance with cooking/housework   Equipment Recommendations  BSC/3in1;Tub/shower seat       Precautions / Restrictions Precautions Precautions: Fall Restrictions Weight Bearing Restrictions: No       Mobility Bed Mobility Overal bed mobility: Modified Independent             General bed mobility comments: Increased time    Transfers                   General transfer comment: Pt declined ambulation     Balance Overall balance assessment: Needs assistance Sitting-balance support: Feet supported, No upper extremity supported Sitting balance-Leahy Scale: Good                                     ADL either performed or assessed with clinical judgement      Cognition Arousal/Alertness: Awake/alert Behavior During Therapy: WFL for tasks assessed/performed Overall Cognitive Status: Within Functional Limits for tasks assessed Area of Impairment: Problem solving, Safety/judgement, Awareness                         Safety/Judgement: Decreased awareness of deficits Awareness: Emergent Problem Solving: Slow processing, Requires verbal cues, Requires tactile cues General Comments: Oriented to self, location, and year. Pt is pleasant and agreeable  Pertinent Vitals/ Pain       Pain Assessment Pain Assessment: No/denies pain         Frequency  Min 4X/week        Progress Toward Goals  OT Goals(current goals can now be found in the care plan section)  Progress towards OT goals: Progressing toward goals  Acute Rehab OT Goals Patient Stated Goal: to go home OT Goal Formulation: With patient/family Time For Goal Achievement:  04/12/22 Potential to Achieve Goals: Good  Plan Discharge plan remains appropriate;Frequency remains appropriate       AM-PAC OT "6 Clicks" Daily Activity     Outcome Measure   Help from another person eating meals?: None Help from another person taking care of personal grooming?: None Help from another person toileting, which includes using toliet, bedpan, or urinal?: A Little Help from another person bathing (including washing, rinsing, drying)?: A Little Help from another person to put on and taking off regular upper body clothing?: A Little Help from another person to put on and taking off regular lower body clothing?: A Little 6 Click Score: 20    End of Session Equipment Utilized During Treatment: Rolling walker (2 wheels)  OT Visit Diagnosis: Unsteadiness on feet (R26.81);Repeated falls (R29.6);History of falling (Z91.81);Other symptoms and signs involving the nervous system (R29.898)   Activity Tolerance Patient tolerated treatment well   Patient Left with call bell/phone within reach;with family/visitor present;in bed   Nurse Communication Mobility status        Time: 1432-1500 OT Time Calculation (min): 28 min  Charges: OT General Charges $OT Visit: 1 Visit OT Treatments $Therapeutic Activity: 23-37 mins  Darleen Crocker, MS, OTR/L , CBIS ascom [phone]  04/09/22, 4:48 PM

## 2022-04-09 NOTE — Progress Notes (Signed)
Physical Therapy Treatment Patient Details Name: Autumn Arnold MRN: [id number] DOB: 1951/07/10 Today's Date: 04/09/2022   History of Present Illness 71 y.o. 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 She also had numbness and weakness of the left arm - imaging revealed small acute/sub-acute CVA.    PT Comments    Pt pleasant and agreeable to therapy with family present in room. Pt performed well with mobility tasks during today's session with CGA for transfers and ambulation. Pt successfully transferred from bed to bathroom to void with CGA and independence with hygiene tasks. Pt ambulated 400 feet with RW and CGA. Pt reported pain at 8/10 in L foot which remained consistent from start to end of session. Recommend home health PT at discharge.    Recommendations for follow up therapy are one component of a multi-disciplinary discharge planning process, led by the attending physician.  Recommendations may be updated based on patient status, additional functional criteria and insurance authorization.  Follow Up Recommendations  Home health PT     Assistance Recommended at Discharge PRN  Patient can return home with the following A little help with walking and/or transfers;Help with stairs or ramp for entrance   Equipment Recommendations  None recommended by PT    Recommendations for Other Services       Precautions / Restrictions Precautions Precautions: Fall Restrictions Weight Bearing Restrictions: No     Mobility  Bed Mobility Overal bed mobility: Modified Independent              General bed mobility comments: Increased time    Transfers Overall transfer level: Needs assistance Equipment used: Rolling walker (2 wheels) Transfers: Sit to/from Stand, Bed to chair/wheelchair/BSC Sit to Stand: Min guard           General transfer comment: Verbal cues for scooting to EOB with feet flat on floor, hand placement    Ambulation/Gait Ambulation/Gait assistance: Min guard Gait Distance (Feet): 400 Feet Assistive device: Rolling walker (2 wheels) Gait Pattern/deviations: Antalgic, Decreased stance time - left, Step-through pattern       General Gait Details: Pt able to talk while walking with no apparent endurance concerns   Stairs             Wheelchair Mobility    Modified Rankin (Stroke Patients Only)       Balance Overall balance assessment: Needs assistance Sitting-balance support: Feet supported, No upper extremity supported Sitting balance-Leahy Scale: Good Sitting balance - Comments: Able to sit without UE support as demonstrated when pt reached fwd to pick up kit kat bar off of the floor next to the recliner she was seated in   Standing balance support: Bilateral upper extremity supported Standing balance-Leahy Scale: Good Standing balance comment: Supported by RW in standing due to pain in L foot. No observed LOB in standing.                            Cognition Arousal/Alertness: Awake/alert Behavior During Therapy: WFL for tasks assessed/performed Overall Cognitive Status: Within Functional Limits for tasks  assessed                                          Exercises Other Exercises Other Exercises: Pt transferred and ambulated into/out of bathroom to void with CGA. Pt independent in standing while handwashing.    General Comments        Pertinent Vitals/Pain Pain Assessment Pain Assessment: 0-10 Pain Score: 8  Pain Location: LLE Pain Descriptors / Indicators: Throbbing Pain  Intervention(s): Limited activity within patient's tolerance, Monitored during session, Repositioned    Home Living                          Prior Function            PT Goals (current goals can now be found in the care plan section) Acute Rehab PT Goals Patient Stated Goal: To go home. PT Goal Formulation: With patient Time For Goal Achievement: 04/18/22 Potential to Achieve Goals: Good Progress towards PT goals: Progressing toward goals    Frequency    Min 2X/week      PT Plan Current plan remains appropriate    Co-evaluation              AM-PAC PT "6 Clicks" Mobility   Outcome Measure  Help needed turning from your back to your side while in a flat bed without using bedrails?: None Help needed moving from lying on your back to sitting on the side of a flat bed without using bedrails?: None Help needed moving to and from a bed to a chair (including a wheelchair)?: None Help needed standing up from a chair using your arms (e.g., wheelchair or bedside chair)?: None Help needed to walk in hospital room?: A Little Help needed climbing 3-5 steps with a railing? : A Little 6 Click Score: 22    End of Session Equipment Utilized During Treatment: Gait belt Activity Tolerance: Patient tolerated treatment well Patient left: with call bell/phone within reach;with family/visitor present;Other (comment);in chair;with chair alarm set (pillow under LEs to float heels) Nurse Communication: Mobility status PT Visit Diagnosis: Muscle weakness (generalized) (M62.81);Difficulty in walking, not elsewhere classified (R26.2);Unsteadiness on feet (R26.81);Other symptoms and signs involving the nervous system (R29.898)     Time: 5520-8022 PT Time Calculation (min) (ACUTE ONLY): 24 min  Charges:                        Autumn Arnold, SPT    Athea Haley 04/09/2022, 1:11 PM

## 2022-04-09 NOTE — H&P (View-Only) (Signed)
[facility]  Daily Progress Note   Subjective  -   No major complaints today.  Left leg still painful.  No new neurologic symptoms  Objective Vitals:   04/08/22 2007 04/08/22 2355 04/09/22 0605 04/09/22 0736  BP: (!) 134/56 (!) 118/51 (!) 131/52 (!) 112/57  Pulse: 90 83 80 81  Resp: '20 16 20 15  '$ Temp: 98.5 F (36.9 C) 98.1 F (36.7 C) 98.3 F (36.8 C) 97.7 F (36.5 C)  TempSrc: Oral Oral Oral Oral  SpO2: 95% 98% 95% 97%  Weight:      Height:        Intake/Output Summary (Last 24 hours) at 04/09/2022 1611 Last data filed at 04/09/2022 1457 Gross per 24 hour  Intake 916.33 ml  Output --  Net 916.33 ml    PULM  CTAB CV  RRR VASC  weak pedal pulses on the right, no pedal pulses on the left  Laboratory CBC    Component Value Date/Time   WBC 7.2 04/09/2022 0424   HGB 11.9 (L) 04/09/2022 0424   HCT 36.9 04/09/2022 0424   PLT 190 04/09/2022 0424    BMET    Component Value Date/Time   NA 139 04/09/2022 0424   K 4.5 04/09/2022 0424   CL 108 04/09/2022 0424   CO2 23 04/09/2022 0424   GLUCOSE 140 (H) 04/09/2022 0424   BUN 12 04/09/2022 0424   CREATININE 0.70 04/09/2022 0424   CALCIUM 8.7 (L) 04/09/2022 0424   GFRNONAA >60 04/09/2022 0424   GFRAA >60 11/08/2015 0419    Assessment/Planning:   Severe left lower extremity ischemia with rest pain Plan for left femoral endarterectomy and left iliac and potentially infrainguinal intervention on Wednesday Risks and benefits are discussed and she is agreeable to proceed   Also has right common carotid artery occlusion and right subclavian artery stenosis with right internal carotid stenosis filling through external collaterals.  In 1 to 2 weeks, will likely plan right carotid endarterectomy with possible stent placement in the right common carotid artery proximally  May also benefit from right subclavian intervention either at that time or at a later date   [name]  04/09/2022, 4:11  PM

## 2022-04-09 NOTE — Progress Notes (Signed)
[facility]  Daily Progress Note   Subjective  -   No major complaints today.  Left leg still painful.  No new neurologic symptoms  Objective Vitals:   04/08/22 2007 04/08/22 2355 04/09/22 0605 04/09/22 0736  BP: (!) 134/56 (!) 118/51 (!) 131/52 (!) 112/57  Pulse: 90 83 80 81  Resp: '20 16 20 15  '$ Temp: 98.5 F (36.9 C) 98.1 F (36.7 C) 98.3 F (36.8 C) 97.7 F (36.5 C)  TempSrc: Oral Oral Oral Oral  SpO2: 95% 98% 95% 97%  Weight:      Height:        Intake/Output Summary (Last 24 hours) at 04/09/2022 1611 Last data filed at 04/09/2022 1457 Gross per 24 hour  Intake 916.33 ml  Output --  Net 916.33 ml    PULM  CTAB CV  RRR VASC  weak pedal pulses on the right, no pedal pulses on the left  Laboratory CBC    Component Value Date/Time   WBC 7.2 04/09/2022 0424   HGB 11.9 (L) 04/09/2022 0424   HCT 36.9 04/09/2022 0424   PLT 190 04/09/2022 0424    BMET    Component Value Date/Time   NA 139 04/09/2022 0424   K 4.5 04/09/2022 0424   CL 108 04/09/2022 0424   CO2 23 04/09/2022 0424   GLUCOSE 140 (H) 04/09/2022 0424   BUN 12 04/09/2022 0424   CREATININE 0.70 04/09/2022 0424   CALCIUM 8.7 (L) 04/09/2022 0424   GFRNONAA >60 04/09/2022 0424   GFRAA >60 11/08/2015 0419    Assessment/Planning:   Severe left lower extremity ischemia with rest pain Plan for left femoral endarterectomy and left iliac and potentially infrainguinal intervention on Wednesday Risks and benefits are discussed and she is agreeable to proceed   Also has right common carotid artery occlusion and right subclavian artery stenosis with right internal carotid stenosis filling through external collaterals.  In 1 to 2 weeks, will likely plan right carotid endarterectomy with possible stent placement in the right common carotid artery proximally  May also benefit from right subclavian intervention either at that time or at a later date   [name]  04/09/2022, 4:11  PM

## 2022-04-09 NOTE — Progress Notes (Signed)
PROGRESS NOTE  Autumn Arnold MLY:[id number] DOB: Apr 19, 1951   PCP: [name], MD  Patient is from: Home.  Lives with husband.  DOA: 04/04/2022 LOS: 5  Chief complaints Chief Complaint  Patient presents with   Altered Mental Status     Brief Narrative / Interim history: 71 year old F with PMH of CVA, OSA, DM-2, HTN, PVD s/p aortoiliac and SMA stent on DAPT and mild cognitive deficits, anxiety and tobacco use disorder presenting with altered mental status, blurry vision, aphasia and left arm numbness and weakness, and admitted for CVA evaluation.  She was hypotensive to 70s/40s.  MRI brain concerning for right frontoparietal acute to early subacute ischemic CVA.  Reportedly, her symptoms resolved in ED except for some residual left arm weakness and numbness.  There was also concern about critical left limb ischemia.  Neurology and vascular surgery consulted.   The next day, CTA head and neck.  Showed unchanged occlusion of right common carotid and plaque at the proximal right and left ICA causing less than 50% stenosis.  No new occlusion or hemodynamically significant stenosis noted.  TTE, carotid US and lower extremity angiography pending.   TTE with without significant finding.  Angiograph showed occluded right CCA filling through collaterals, 80 to 90% stenosis of the right subclavian artery, occluded left common iliac artery, patent right common iliac artery stent.  Cardiology cleared patient for revascularization.  Vascular surgery following.  Subjective: Seen and examined earlier this morning.  No major events overnight of this morning.  Mild LLE pain. Tolerating diet. Worked w/ PT this morning  Objective: Vitals:   04/08/22 2007 04/08/22 2355 04/09/22 0605 04/09/22 0736  BP: (!) 134/56 (!) 118/51 (!) 131/52 (!) 112/57  Pulse: 90 83 80 81  Resp: '20 16 20 15  '$ Temp: 98.5 F (36.9 C) 98.1 F (36.7 C) 98.3 F (36.8 C) 97.7 F (36.5 C)  TempSrc: Oral Oral Oral Oral  SpO2:  95% 98% 95% 97%  Weight:      Height:        Examination:  GENERAL: No apparent distress.  Nontoxic. RESP:  No IWOB.  Fair aeration bilaterally. CVS:  RRR. Heart sounds normal.  ABD/GI/GU: BS+. Abd soft, NTND.  MSK/EXT:  Moves extremities. No apparent deformity. No edema.  No palpable DP pulses. SKIN: left foot cool NEURO: Awake and alert. Oriented appropriately.  No apparent focal neuro deficit other than some LLE weakness. PSYCH: Calm. Normal affect.   Procedures:  6/1-angiography by vascular surgery.  Microbiology summarized: None  Assessment and Plan: *Acute CVA (cerebrovascular accident) [facility]) MRI concerning for acute to early subacute right frontoparietal CVA.  She seems to have residual left lower extremity weakness.  Per family, speech is back to baseline.  CTA head and neck with chronic right CCA occlusion.  TTE without significant finding.  LDL 64.  A1c 7.6%.   -SBP goal 120s to 140s.  Out of permissive HTN window. -Continue statin, Plavix and aspirin indefinitely -Encourage tobacco cessation. -Continue therapy - neurology has signed off  Carotid artery occlusion with infarction ([place]) Has occluded right common carotid artery with collaterals from external carotid artery. -Continue DAPT and statin. -On IV fluid to avoid hypotension -Vascular surgery on board.  Subclavian steal? Vascular plans to address this at outpatient f/u  Hypotension BP was 70/48.  Resolved. -Continue reduced dose of metoprolol  Critical limb ischemia of left lower extremity (HCC) PVD s/p aorto iliac and SMA stent 06/2021.  She has some left foot erythema with no  palpable DP or PT pulse on my exam. Angiograph showed occluded right CCA filling through collaterals, 80 to 90% stenosis of the right subclavian artery, occluded left common iliac artery, patent right common iliac artery stent. -Continue Plavix, aspirin and atorvastatin. -Pain control with as needed Tylenol, tramadol and  oxycodone -Encouraged smoking cessation -Cardiology cleared patient for revascularization.  Vascular surgery to proceed on 6/7  Iron deficiency anemia Recent Labs    06/25/21 1550 06/26/21 0500 06/27/21 0338 06/28/21 0545 02/26/22 0200 04/03/22 1930 04/04/22 0614 04/05/22 0553 04/06/22 0612 04/07/22 0447  HGB 10.5* 9.1* 9.3* 9.5* 11.5* 11.2* 10.1* 10.2* 10.8* 10.7*  H&H relatively stable.  No report of melena or hematochezia.  Anemia panel suggests iron deficiency. - s/p IV ferric gluconate 250 mg x 1 -Monitor H&H   Type 2 diabetes mellitus with hyperlipidemia (HCC) A1c 7.6%. Recent Labs  Lab 04/07/22 1156 04/07/22 1541 04/07/22 2154 04/08/22 0811 04/08/22 1230  GLUCAP 146* 194* 129* 128* 88  Continue SSI and statin   Hypomagnesemia Monitor replenish as appropriate  COPD (chronic obstructive pulmonary disease) (HCC) Stable.  Over 60-pack-year history.  Currently smokes about a pack a day.  Discussed the importance of smoking cessation.  -Nicotine patch as needed  Tobacco dependence Nicotine patch and tobacco cessation counseling  OSA (obstructive sleep apnea) Continue oxygen at night  Anxiety and depression Stable.  Continue buspirone, sertraline and amitriptyline     DVT prophylaxis:  enoxaparin (LOVENOX) injection 40 mg Start: 04/04/22 0800  Code Status: Full code Family Communication: sister updated @ bedside 6/5 Level of care: Telemetry Medical Status is: Inpatient Remains inpatient appropriate because: need for procedure 6/7   Final disposition: Home with home health once medically cleared Consultants:  Neurology-signed off Cardiology-signed off Vascular surgery-following  Sch Meds:  Scheduled Meds:  aspirin EC  81 mg Oral Daily   atorvastatin  80 mg Oral QPM   busPIRone  5 mg Oral BID   clopidogrel  75 mg Oral Daily   enoxaparin (LOVENOX) injection  40 mg Subcutaneous Q24H   insulin aspart  0-5 Units Subcutaneous QHS   insulin aspart   0-9 Units Subcutaneous TID WC   metoprolol tartrate  12.5 mg Oral BID   nicotine  21 mg Transdermal Daily   pantoprazole  40 mg Oral Daily   sertraline  150 mg Oral Daily   Continuous Infusions:  sodium chloride Stopped (04/08/22 1634)   PRN Meds:.acetaminophen **OR** acetaminophen (TYLENOL) oral liquid 160 mg/5 mL **OR** acetaminophen, albuterol, HYDROmorphone (DILAUDID) injection, ondansetron (ZOFRAN) IV, oxyCODONE, traMADol  Antimicrobials: Anti-infectives (From admission, onward)    Start     Dose/Rate Route Frequency Ordered Stop   04/05/22 1408  ceFAZolin (ANCEF) IVPB 2g/100 mL premix        2 g 200 mL/hr over 30 Minutes Intravenous 30 min pre-op 04/05/22 1408 04/05/22 1615        I have personally reviewed the following labs and images: CBC: Recent Labs  Lab 04/03/22 1930 04/04/22 0614 04/05/22 0553 04/06/22 0612 04/07/22 0447 04/09/22 0424  WBC 11.8* 8.5 6.7 7.1 6.9 7.2  NEUTROABS 8.1*  --   --   --   --   --   HGB 11.2* 10.1* 10.2* 10.8* 10.7* 11.9*  HCT 35.5* 32.1* 31.6* 33.2* 32.3* 36.9  MCV 91.3 91.2 88.8 87.8 88.0 88.3  PLT 213 183 194 177 167 190   BMP &GFR Recent Labs  Lab 04/03/22 1930 04/04/22 9509 04/05/22 0553 04/06/22 0612 04/07/22 0447 04/09/22 0424  NA 136  --  139 136 136 139  K 3.9  --  4.6 4.1 4.4 4.5  CL 102  --  108 105 104 108  CO2 24  --  '26 26 26 23  '$ GLUCOSE 116*  --  131* 122* 143* 140*  BUN 16  --  '12 9 14 12  '$ CREATININE 0.93 1.08* 0.74 0.70 0.87 0.70  CALCIUM 9.6  --  8.8* 8.2* 8.6* 8.7*  MG  --   --  1.6* 1.7 1.8 1.7  PHOS  --   --  3.3 3.2 3.9 3.8   Estimated Creatinine Clearance: 55.2 mL/min (by C-G formula based on SCr of 0.7 mg/dL). Liver & Pancreas: Recent Labs  Lab 04/03/22 1930 04/05/22 0553 04/06/22 0612 04/07/22 0447 04/09/22 0424  AST 15  --   --   --   --   ALT 12  --   --   --   --   ALKPHOS 89  --   --   --   --   BILITOT 0.4  --   --   --   --   PROT 7.1  --   --   --   --   ALBUMIN 3.7 3.1*  3.2* 3.1* 3.2*   No results for input(s): LIPASE, AMYLASE in the last 168 hours. No results for input(s): AMMONIA in the last 168 hours. Diabetic: No results for input(s): HGBA1C in the last 72 hours.  Recent Labs  Lab 04/08/22 0811 04/08/22 1230 04/08/22 1615 04/08/22 1959 04/09/22 0801  GLUCAP 128* 88 150* 223* 150*   Cardiac Enzymes: No results for input(s): CKTOTAL, CKMB, CKMBINDEX, TROPONINI in the last 168 hours. No results for input(s): PROBNP in the last 8760 hours. Coagulation Profile: No results for input(s): INR, PROTIME in the last 168 hours. Thyroid Function Tests: No results for input(s): TSH, T4TOTAL, FREET4, T3FREE, THYROIDAB in the last 72 hours. Lipid Profile: No results for input(s): CHOL, HDL, LDLCALC, TRIG, CHOLHDL, LDLDIRECT in the last 72 hours.  Anemia Panel: Recent Labs    04/07/22 0447  VITAMINB12 292  FOLATE 9.2  FERRITIN 40  TIBC 322  IRON 29  RETICCTPCT 1.5   Urine analysis:    Component Value Date/Time   COLORURINE YELLOW (A) 06/22/2021 2052   APPEARANCEUR CLEAR (A) 06/22/2021 2052   LABSPEC 1.010 06/22/2021 2052   PHURINE 6.0 06/22/2021 2052   GLUCOSEU 50 (A) 06/22/2021 2052   HGBUR NEGATIVE 06/22/2021 2052   BILIRUBINUR NEGATIVE 06/22/2021 2052   KETONESUR 5 (A) 06/22/2021 2052   PROTEINUR NEGATIVE 06/22/2021 2052   NITRITE NEGATIVE 06/22/2021 2052   LEUKOCYTESUR NEGATIVE 06/22/2021 2052   Sepsis Labs: Invalid input(s): PROCALCITONIN, [place]  Microbiology: No results found for this or any previous visit (from the past 240 hour(s)).  Radiology Studies: No results found.    Laurey Arrow, MD [facility]  If 7PM-7AM, please contact night-coverage [email or web address] 04/09/2022, 12:11 PM

## 2022-04-10 DIAGNOSIS — I639 Cerebral infarction, unspecified: Secondary | ICD-10-CM | POA: Diagnosis not present

## 2022-04-10 LAB — GLUCOSE, CAPILLARY
Glucose-Capillary: 132 mg/dL — ABNORMAL HIGH (ref 70–99)
Glucose-Capillary: 149 mg/dL — ABNORMAL HIGH (ref 70–99)
Glucose-Capillary: 190 mg/dL — ABNORMAL HIGH (ref 70–99)
Glucose-Capillary: 187 mg/dL — ABNORMAL HIGH (ref 70–99)

## 2022-04-10 NOTE — TOC Progression Note (Signed)
Transition of Care [facility]) - Progression Note    Patient Details  Name: Autumn Arnold MRN: [id number] Date of Birth: 11/22/1950  Transition of Care [facility]) CM/SW [place], RN Phone Number: 04/10/2022, 11:10 AM  Clinical Narrative:   Awaiting vascular surgery tomorrow, and then re evaluation.  TOC to follow.         Expected Discharge Plan and Services                                                 Social Determinants of Health (SDOH) Interventions    Readmission Risk Interventions    06/23/2021    2:20 PM  Readmission Risk Prevention Plan  Post Dischage Appt Complete  Medication Screening Complete  Transportation Screening Complete

## 2022-04-10 NOTE — Progress Notes (Signed)
PROGRESS NOTE  Autumn Arnold JHE:[id number] DOB: [birth year]   PCP: [name], MD  Patient is from: Home.  Lives with husband.  DOA: 04/04/2022 LOS: 6  Chief complaints Chief Complaint  Patient presents with   Altered Mental Status     Brief Narrative / Interim history: 71 year old F with PMH of CVA, OSA, DM-2, HTN, PVD s/p aortoiliac and SMA stent on DAPT and mild cognitive deficits, anxiety and tobacco use disorder presenting with altered mental status, blurry vision, aphasia and left arm numbness and weakness, and admitted for CVA evaluation.  She was hypotensive to 70s/40s.  MRI brain concerning for right frontoparietal acute to early subacute ischemic CVA.  Reportedly, her symptoms resolved in ED except for some residual left arm weakness and numbness.  There was also concern about critical left limb ischemia.  Neurology and vascular surgery consulted.   The next day, CTA head and neck.  Showed unchanged occlusion of right common carotid and plaque at the proximal right and left ICA causing less than 50% stenosis.  No new occlusion or hemodynamically significant stenosis noted.  TTE, carotid US and lower extremity angiography pending.   TTE with without significant finding.  Angiograph showed occluded right CCA filling through collaterals, 80 to 90% stenosis of the right subclavian artery, occluded left common iliac artery, patent right common iliac artery stent.  Cardiology cleared patient for revascularization.  Vascular surgery following.  Subjective: Seen and examined earlier this morning.  No major events overnight of this morning.  Mild LLE pain. Tolerating diet. Worked w/ PT this morning  Objective: Vitals:   04/10/22 0022 04/10/22 0508 04/10/22 0742 04/10/22 0743  BP: (!) 148/47 134/63 (!) 164/147 (!) 145/55  Pulse: 80 79 80 80  Resp: '19 17 17   '$ Temp: 98.1 F (36.7 C) 98 F (36.7 C) 98 F (36.7 C)   TempSrc:   Oral   SpO2: 99% 96% 96%   Weight:      Height:         Examination:  GENERAL: No apparent distress.  Nontoxic. RESP:  No IWOB.  Fair aeration bilaterally. CVS:  RRR. Heart sounds normal.  ABD/GI/GU: BS+. Abd soft, NTND.  MSK/EXT:  Moves extremities. No apparent deformity. No edema.  No palpable DP pulses. SKIN: left foot cool NEURO: Awake and alert. Oriented appropriately.  No apparent focal neuro deficit other than some LLE weakness. PSYCH: Calm. Normal affect.   Procedures:  6/1-angiography by vascular surgery.  Microbiology summarized: None  Assessment and Plan: *Acute CVA (cerebrovascular accident) [facility]) MRI concerning for acute to early subacute right frontoparietal CVA.  She seems to have residual left lower extremity weakness.  Per family, speech is back to baseline.  CTA head and neck with chronic right CCA occlusion.  TTE without significant finding.  LDL 64.  A1c 7.6%.   -SBP goal 120s to 140s.  Out of permissive HTN window. -Continue statin, Plavix and aspirin indefinitely -Encourage tobacco cessation. -Continue therapy - neurology has signed off  Carotid artery occlusion with infarction ([place]) Has occluded right common carotid artery with collaterals from external carotid artery. -Continue DAPT and statin. -On IV fluid to avoid hypotension -Vascular surgery on board.  Subclavian steal? Vascular plans to address this at outpatient f/u  Hypotension Resolved -Continue reduced dose of metoprolol  Critical limb ischemia of left lower extremity (HCC) PVD s/p aorto iliac and SMA stent 06/2021.  She has some left foot erythema with no palpable DP or PT pulse on my exam.  Angiograph showed occluded right CCA filling through collaterals, 80 to 90% stenosis of the right subclavian artery, occluded left common iliac artery, patent right common iliac artery stent. -Continue Plavix, aspirin and atorvastatin. -Pain control with as needed Tylenol, tramadol and oxycodone -Encouraged smoking cessation -Cardiology cleared  patient for revascularization.  Vascular surgery to proceed on 6/7  Iron deficiency anemia Recent Labs    06/25/21 1550 06/26/21 0500 06/27/21 0338 06/28/21 0545 02/26/22 0200 04/03/22 1930 04/04/22 0614 04/05/22 0553 04/06/22 0612 04/07/22 0447  HGB 10.5* 9.1* 9.3* 9.5* 11.5* 11.2* 10.1* 10.2* 10.8* 10.7*  H&H relatively stable.  No report of melena or hematochezia.  Anemia panel suggests iron deficiency. - s/p IV ferric gluconate 250 mg x 1 -Monitor H&H   Type 2 diabetes mellitus with hyperlipidemia (HCC) A1c 7.6%. Recent Labs  Lab 04/07/22 1156 04/07/22 1541 04/07/22 2154 04/08/22 0811 04/08/22 1230  GLUCAP 146* 194* 129* 128* 88  Continue SSI and statin   Hypomagnesemia Monitor replenish as appropriate  COPD (chronic obstructive pulmonary disease) (HCC) Stable.  Over 60-pack-year history.  Currently smokes about a pack a day.  Discussed the importance of smoking cessation.  -Nicotine patch as needed  Tobacco dependence Nicotine patch and tobacco cessation counseling  OSA (obstructive sleep apnea) Continue oxygen at night  Anxiety and depression Stable.  Continue buspirone, sertraline and amitriptyline     DVT prophylaxis:  enoxaparin (LOVENOX) injection 40 mg Start: 04/04/22 0800  Code Status: Full code Family Communication: husband and other family members updated @ bedside 6/6 Level of care: Telemetry Medical Status is: Inpatient Remains inpatient appropriate because: need for procedure 6/7   Final disposition: Home with home health once medically cleared Consultants:  Neurology-signed off Cardiology-signed off Vascular surgery-following  Sch Meds:  Scheduled Meds:  aspirin EC  81 mg Oral Daily   atorvastatin  80 mg Oral QPM   busPIRone  5 mg Oral BID   clopidogrel  75 mg Oral Daily   enoxaparin (LOVENOX) injection  40 mg Subcutaneous Q24H   insulin aspart  0-5 Units Subcutaneous QHS   insulin aspart  0-9 Units Subcutaneous TID WC    metoprolol tartrate  12.5 mg Oral BID   nicotine  21 mg Transdermal Daily   pantoprazole  40 mg Oral Daily   polyethylene glycol  17 g Oral Daily   sertraline  150 mg Oral Daily   Continuous Infusions:   PRN Meds:.acetaminophen **OR** acetaminophen (TYLENOL) oral liquid 160 mg/5 mL **OR** acetaminophen, albuterol, Glycerin (Adult), HYDROmorphone (DILAUDID) injection, ondansetron (ZOFRAN) IV, oxyCODONE, traMADol  Antimicrobials: Anti-infectives (From admission, onward)    Start     Dose/Rate Route Frequency Ordered Stop   04/05/22 1408  ceFAZolin (ANCEF) IVPB 2g/100 mL premix        2 g 200 mL/hr over 30 Minutes Intravenous 30 min pre-op 04/05/22 1408 04/05/22 1615        I have personally reviewed the following labs and images: CBC: Recent Labs  Lab 04/03/22 1930 04/04/22 0614 04/05/22 0553 04/06/22 0612 04/07/22 0447 04/09/22 0424  WBC 11.8* 8.5 6.7 7.1 6.9 7.2  NEUTROABS 8.1*  --   --   --   --   --   HGB 11.2* 10.1* 10.2* 10.8* 10.7* 11.9*  HCT 35.5* 32.1* 31.6* 33.2* 32.3* 36.9  MCV 91.3 91.2 88.8 87.8 88.0 88.3  PLT 213 183 194 177 167 190   BMP &GFR Recent Labs  Lab 04/03/22 1930 04/04/22 1275 04/05/22 0553 04/06/22 0612 04/07/22 0447 04/09/22 0424  NA 136  --  139 136 136 139  K 3.9  --  4.6 4.1 4.4 4.5  CL 102  --  108 105 104 108  CO2 24  --  '26 26 26 23  '$ GLUCOSE 116*  --  131* 122* 143* 140*  BUN 16  --  '12 9 14 12  '$ CREATININE 0.93 1.08* 0.74 0.70 0.87 0.70  CALCIUM 9.6  --  8.8* 8.2* 8.6* 8.7*  MG  --   --  1.6* 1.7 1.8 1.7  PHOS  --   --  3.3 3.2 3.9 3.8   Estimated Creatinine Clearance: 55.2 mL/min (by C-G formula based on SCr of 0.7 mg/dL). Liver & Pancreas: Recent Labs  Lab 04/03/22 1930 04/05/22 0553 04/06/22 0612 04/07/22 0447 04/09/22 0424  AST 15  --   --   --   --   ALT 12  --   --   --   --   ALKPHOS 89  --   --   --   --   BILITOT 0.4  --   --   --   --   PROT 7.1  --   --   --   --   ALBUMIN 3.7 3.1* 3.2* 3.1* 3.2*    No results for input(s): LIPASE, AMYLASE in the last 168 hours. No results for input(s): AMMONIA in the last 168 hours. Diabetic: No results for input(s): HGBA1C in the last 72 hours.  Recent Labs  Lab 04/09/22 1214 04/09/22 1638 04/09/22 2206 04/10/22 0751 04/10/22 1154  GLUCAP 158* 133* 210* 132* 149*   Cardiac Enzymes: No results for input(s): CKTOTAL, CKMB, CKMBINDEX, TROPONINI in the last 168 hours. No results for input(s): PROBNP in the last 8760 hours. Coagulation Profile: No results for input(s): INR, PROTIME in the last 168 hours. Thyroid Function Tests: No results for input(s): TSH, T4TOTAL, FREET4, T3FREE, THYROIDAB in the last 72 hours. Lipid Profile: No results for input(s): CHOL, HDL, LDLCALC, TRIG, CHOLHDL, LDLDIRECT in the last 72 hours.  Anemia Panel: No results for input(s): VITAMINB12, FOLATE, FERRITIN, TIBC, IRON, RETICCTPCT in the last 72 hours.  Urine analysis:    Component Value Date/Time   COLORURINE YELLOW (A) 06/22/2021 2052   APPEARANCEUR CLEAR (A) 06/22/2021 2052   LABSPEC 1.010 06/22/2021 2052   PHURINE 6.0 06/22/2021 2052   GLUCOSEU 50 (A) 06/22/2021 2052   HGBUR NEGATIVE 06/22/2021 2052   BILIRUBINUR NEGATIVE 06/22/2021 2052   KETONESUR 5 (A) 06/22/2021 2052   PROTEINUR NEGATIVE 06/22/2021 2052   NITRITE NEGATIVE 06/22/2021 2052   LEUKOCYTESUR NEGATIVE 06/22/2021 2052   Sepsis Labs: Invalid input(s): PROCALCITONIN, [place]  Microbiology: No results found for this or any previous visit (from the past 240 hour(s)).  Radiology Studies: No results found.    Laurey Arrow, MD [facility]  If 7PM-7AM, please contact night-coverage [email or web address] 04/10/2022, 2:49 PM

## 2022-04-10 NOTE — Progress Notes (Signed)
Occupational Therapy Treatment Patient Details Name: Autumn Arnold MRN: [id number] DOB: 03/27/1951 Today's Date: 04/10/2022   History of present illness 71 y.o. 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 She also had numbness and weakness of the left arm - imaging revealed small acute/sub-acute CVA.   OT comments  Pt in bed upon OT arrival, agreeable to OT session.  OT issued pink theraputty and instructed pt in exercises for L hand strengthening and coordination training.  Written HEP issued; pt required min vc for technique and attention to task.  See note for exercise details.  Pt initially declined OOB activity but end of session reporting need to void.  Pt ambulated with initial min guard, progressing quickly to supv with use of RW to amb from bedside to bathroom toilet.  Pt managed all clothing in standing, toileting and hygiene with supv.  Stood at sink for Comptroller also with supv.  Pt requested back to bed vs chair d/t L ankle pain.  All items within reach.  Encouraged completion of putty exercises at least 1-2 x daily and left putty and handout on bedside tray.  Will continue to follow to work towards OT goals and poc.    Recommendations for follow up therapy are one component of a multi-disciplinary discharge planning process, led by the attending physician.  Recommendations may be updated based on patient status, additional functional criteria and insurance authorization.    Follow Up Recommendations  Home health OT    Assistance Recommended at Discharge Frequent or constant  Supervision/Assistance  Patient can return home with the following  A little help with walking and/or transfers;A little help with bathing/dressing/bathroom;Direct supervision/assist for medications management;Direct supervision/assist for financial management;Assistance with cooking/housework   Equipment Recommendations  BSC/3in1;Tub/shower seat    Recommendations for Other Services      Precautions / Restrictions Precautions Precautions: Fall Restrictions Weight Bearing Restrictions: No       Mobility Bed Mobility Overal bed mobility: Modified Independent               Patient Response: Cooperative  Transfers Overall transfer level: Needs assistance Equipment used: Rolling walker (2 wheels) Transfers: Sit to/from Stand Sit to Stand: Min guard                 Balance Overall balance assessment: Needs assistance Sitting-balance support: Feet supported, No upper extremity supported Sitting balance-Leahy Scale: Good     Standing balance support: During functional activity, Reliant on assistive device for balance Standing balance-Leahy Scale: Good Standing balance comment: Able to stand with wide BOS to manage clothing without LOB; able to reach within BOS for paper towel to complete hand hygiene                           ADL either performed or assessed with clinical judgement   ADL Overall ADL's : Needs assistance/impaired     Grooming: Wash/dry Runner, broadcasting/film/video: Supervision/safety;Ambulation;Rolling walker (2 wheels);Grab  bars   Toileting- Clothing Manipulation and Hygiene: Supervision/safety;Sit to/from stand Toileting - Clothing Manipulation Details (indicate cue type and reason): managed hospital gown and lowered/hiked panties in standing, wider BOS     Functional mobility during ADLs: Min guard;Rolling walker (2 wheels) General ADL Comments: initial min guard upon standing at bedside, progressed  to close supv with mobility in bathroom.    Extremity/Trunk Assessment Upper Extremity Assessment Upper Extremity Assessment: LUE deficits/detail LUE Coordination: decreased fine motor   Lower Extremity Assessment Lower Extremity Assessment: Generalized weakness   Cervical / Trunk Assessment Cervical / Trunk Assessment: Normal    Vision Baseline Vision/History: 1 Wears glasses Patient Visual Report: Blurring of vision Tracking/Visual Pursuits: Able to track stimulus in all quads without difficulty Visual Fields: No apparent deficits   Perception Perception Perception: Within Functional Limits   Praxis      Cognition Arousal/Alertness: Awake/alert Behavior During Therapy: WFL for tasks assessed/performed Overall Cognitive Status: Within Functional Limits for tasks assessed                               Problem Solving: Slow processing, Requires verbal cues, Requires tactile cues General Comments: 0x4, pleasant, cooperative        Exercises Other Exercises Other Exercises: Instructed in and completed pink theraputty exercises for L hand strengthening and coordination training.  Pt completed gross grasping, lateral and 3 point pinching, digit opposition, and digit abd/add.  Handout issued.  Pt able to perform with min vc for technique.  Intermittent min vc to stay on task with specific exercises, as pt occasionally alternated between various.           General Comments      Pertinent Vitals/ Pain       Pain Assessment Pain Assessment: 0-10 Pain Score: 5  Pain Location: L ankle Pain Descriptors / Indicators: Throbbing, Aching Pain Intervention(s): Limited activity within patient's tolerance  Home Living                                          Prior Functioning/Environment              Frequency  Min 4X/week        Progress Toward Goals  OT Goals(current goals can now be found in the care plan section)  Progress  towards OT goals: Progressing toward goals  Acute Rehab OT Goals Patient Stated Goal: to go home OT Goal Formulation: With patient/family Time For Goal Achievement: 04/12/22 Potential to Achieve Goals: Good  Plan Discharge plan remains appropriate;Frequency remains appropriate    Co-evaluation                 AM-PAC OT "6 Clicks" Daily Activity     Outcome Measure   Help from another person eating meals?: None Help from another person taking care of personal grooming?: None Help from another person toileting, which includes using toliet, bedpan, or urinal?: A Little Help from another person bathing (including washing, rinsing, drying)?: A Little Help from another person to put on and taking off regular upper body clothing?: A Little Help from another person to put on and taking off regular lower body clothing?: A Little 6 Click Score: 20    End of Session Equipment Utilized During Treatment: Rolling walker (2 wheels)  OT Visit Diagnosis: Unsteadiness on feet (R26.81);Repeated falls (  R29.6);History of falling (Z91.81);Other symptoms and signs involving the nervous system (R29.898)   Activity Tolerance Patient tolerated treatment well   Patient Left with call bell/phone within reach;with family/visitor present;in bed   Nurse Communication          Time: 4584-8350 OT Time Calculation (min): 32 min  Charges: OT General Charges $OT Visit: 1 Visit OT Treatments $Self Care/Home Management : 8-22 mins $Therapeutic Exercise: 8-22 mins  Leta Speller, MS, OTR/L   Darleene Cleaver 04/10/2022, 11:58 AM

## 2022-04-11 ENCOUNTER — Inpatient Hospital Stay: Payer: Medicare HMO

## 2022-04-11 ENCOUNTER — Other Ambulatory Visit: Payer: Self-pay

## 2022-04-11 ENCOUNTER — Encounter
Admission: EM | Disposition: A | Discharge status: Home Health | Payer: Self-pay | Source: Home / Self Care | Attending: Student

## 2022-04-11 DIAGNOSIS — I700 Atherosclerosis of aorta: Secondary | ICD-10-CM

## 2022-04-11 DIAGNOSIS — I70211 Atherosclerosis of native arteries of extremities with intermittent claudication, right leg: Secondary | ICD-10-CM

## 2022-04-11 HISTORY — PX: ENDARTERECTOMY FEMORAL: SHX5804

## 2022-04-11 HISTORY — PX: INSERTION OF ILIAC STENT: SHX6256

## 2022-04-11 LAB — BASIC METABOLIC PANEL
Anion gap: 5 (ref 5–15)
Anion gap: 6 (ref 5–15)
BUN: 11 mg/dL (ref 8–23)
BUN: 14 mg/dL (ref 8–23)
CO2: 22 mmol/L (ref 22–32)
CO2: 26 mmol/L (ref 22–32)
Calcium: 8.3 mg/dL — ABNORMAL LOW (ref 8.9–10.3)
Calcium: 8.9 mg/dL (ref 8.9–10.3)
Chloride: 103 mmol/L (ref 98–111)
Chloride: 109 mmol/L (ref 98–111)
Creatinine, Ser: 0.76 mg/dL (ref 0.44–1.00)
Creatinine, Ser: 0.78 mg/dL (ref 0.44–1.00)
GFR, Estimated: >60 mL/min (ref >60)
GFR, Estimated: >60 mL/min (ref >60)
Glucose, Bld: 141 mg/dL — ABNORMAL HIGH (ref 70–99)
Glucose, Bld: 264 mg/dL — ABNORMAL HIGH (ref 70–99)
Potassium: 4 mmol/L (ref 3.5–5.1)
Potassium: 4.3 mmol/L (ref 3.5–5.1)
Sodium: 135 mmol/L (ref 135–145)
Sodium: 136 mmol/L (ref 135–145)

## 2022-04-11 LAB — CBC
HCT: 30.2 % — ABNORMAL LOW (ref 36.0–46.0)
Hemoglobin: 9.8 g/dL — ABNORMAL LOW (ref 12.0–15.0)
MCH: 29.1 pg (ref 26.0–34.0)
MCHC: 32.5 g/dL (ref 30.0–36.0)
MCV: 89.6 fL (ref 80.0–100.0)
Platelets: 189 10*3/uL (ref 150–400)
RBC: 3.37 MIL/uL — ABNORMAL LOW (ref 3.87–5.11)
RDW: 14.7 % (ref 11.5–15.5)
WBC: 11.4 10*3/uL — ABNORMAL HIGH (ref 4.0–10.5)
nRBC: 0 % (ref 0.0–0.2)

## 2022-04-11 LAB — GLUCOSE, CAPILLARY
Glucose-Capillary: 148 mg/dL — ABNORMAL HIGH (ref 70–99)
Glucose-Capillary: 201 mg/dL — ABNORMAL HIGH (ref 70–99)
Glucose-Capillary: 203 mg/dL — ABNORMAL HIGH (ref 70–99)
Glucose-Capillary: 194 mg/dL — ABNORMAL HIGH (ref 70–99)
Glucose-Capillary: 177 mg/dL — ABNORMAL HIGH (ref 70–99)

## 2022-04-11 LAB — CBC WITH DIFFERENTIAL/PLATELET
Abs Immature Granulocytes: 0.05 10*3/uL (ref 0.00–0.07)
Basophils Absolute: 0 10*3/uL (ref 0.0–0.1)
Basophils Relative: 1 %
Eosinophils Absolute: 0.1 10*3/uL (ref 0.0–0.5)
Eosinophils Relative: 2 %
HCT: 36.3 % (ref 36.0–46.0)
Hemoglobin: 11.8 g/dL — ABNORMAL LOW (ref 12.0–15.0)
Immature Granulocytes: 1 %
Lymphocytes Relative: 26 %
Lymphs Abs: 1.9 10*3/uL (ref 0.7–4.0)
MCH: 28.9 pg (ref 26.0–34.0)
MCHC: 32.5 g/dL (ref 30.0–36.0)
MCV: 89 fL (ref 80.0–100.0)
Monocytes Absolute: 0.7 10*3/uL (ref 0.1–1.0)
Monocytes Relative: 10 %
Neutro Abs: 4.4 10*3/uL (ref 1.7–7.7)
Neutrophils Relative %: 60 %
Platelets: 206 10*3/uL (ref 150–400)
RBC: 4.08 MIL/uL (ref 3.87–5.11)
RDW: 14.6 % (ref 11.5–15.5)
WBC: 7.3 10*3/uL (ref 4.0–10.5)
nRBC: 0 % (ref 0.0–0.2)

## 2022-04-11 LAB — TYPE AND SCREEN
ABO/RH(D): O NEG
Antibody Screen: NEGATIVE

## 2022-04-11 LAB — MRSA NEXT GEN BY PCR, NASAL: MRSA by PCR Next Gen: NOT DETECTED

## 2022-04-11 SURGERY — ENDARTERECTOMY, FEMORAL
Anesthesia: General

## 2022-04-11 MED ORDER — FAMOTIDINE IN NACL 20-0.9 MG/50ML-% IV SOLN
20.0000 mg | Freq: Two times a day (BID) | INTRAVENOUS | Status: DC
Start: 2022-04-11 — End: 2022-04-12
  Administered 2022-04-11 (×2): 20 mg via INTRAVENOUS
  Filled 2022-04-11 (×2): qty 50

## 2022-04-11 MED ORDER — HYDROMORPHONE HCL 1 MG/ML IJ SOLN
INTRAMUSCULAR | Status: DC | PRN
  Administered 2022-04-11: .25 mg via INTRAVENOUS
  Administered 2022-04-11: .5 mg via INTRAVENOUS
  Administered 2022-04-11: .25 mg via INTRAVENOUS

## 2022-04-11 MED ORDER — LABETALOL HCL 5 MG/ML IV SOLN
10.0000 mg | INTRAVENOUS | Status: DC | PRN
  Administered 2022-04-12: 10 mg via INTRAVENOUS
  Filled 2022-04-11: qty 4

## 2022-04-11 MED ORDER — OXYCODONE HCL 5 MG PO TABS: 5.0000 mg | ORAL_TABLET | Freq: Once | ORAL | Status: DC | PRN

## 2022-04-11 MED ORDER — HYDRALAZINE HCL 20 MG/ML IJ SOLN: 5.0000 mg | INTRAMUSCULAR | Status: DC | PRN

## 2022-04-11 MED ORDER — SENNOSIDES-DOCUSATE SODIUM 8.6-50 MG PO TABS: 1.0000 | ORAL_TABLET | Freq: Every evening | ORAL | Status: DC | PRN

## 2022-04-11 MED ORDER — PROPOFOL 10 MG/ML IV BOLUS
INTRAVENOUS | Status: DC | PRN
  Administered 2022-04-11: 100 mg via INTRAVENOUS

## 2022-04-11 MED ORDER — ALBUTEROL SULFATE HFA 108 (90 BASE) MCG/ACT IN AERS
INHALATION_SPRAY | RESPIRATORY_TRACT | Status: DC | PRN
  Administered 2022-04-11: 2 via RESPIRATORY_TRACT

## 2022-04-11 MED ORDER — HEPARIN SODIUM (PORCINE) 1000 UNIT/ML IJ SOLN
INTRAMUSCULAR | Status: AC
  Filled 2022-04-11: qty 10

## 2022-04-11 MED ORDER — FENTANYL CITRATE (PF) 100 MCG/2ML IJ SOLN: 25.0000 ug | INTRAMUSCULAR | Status: DC | PRN

## 2022-04-11 MED ORDER — DOCUSATE SODIUM 100 MG PO CAPS
100.0000 mg | ORAL_CAPSULE | Freq: Every day | ORAL | Status: DC
  Administered 2022-04-12 – 2022-04-13 (×2): 100 mg via ORAL
  Filled 2022-04-11 (×2): qty 1

## 2022-04-11 MED ORDER — DEXAMETHASONE SODIUM PHOSPHATE 10 MG/ML IJ SOLN
INTRAMUSCULAR | Status: DC | PRN
  Administered 2022-04-11: 5 mg via INTRAVENOUS

## 2022-04-11 MED ORDER — PHENYLEPHRINE HCL-NACL 20-0.9 MG/250ML-% IV SOLN
INTRAVENOUS | Status: DC | PRN
  Administered 2022-04-11: 20 ug/min via INTRAVENOUS

## 2022-04-11 MED ORDER — SUGAMMADEX SODIUM 200 MG/2ML IV SOLN
INTRAVENOUS | Status: DC | PRN
  Administered 2022-04-11: 200 mg via INTRAVENOUS

## 2022-04-11 MED ORDER — GUAIFENESIN-DM 100-10 MG/5ML PO SYRP
15.0000 mL | ORAL_SOLUTION | ORAL | Status: DC | PRN
Start: 2022-04-11 — End: 2022-04-13

## 2022-04-11 MED ORDER — ONDANSETRON HCL 4 MG/2ML IJ SOLN
4.0000 mg | Freq: Four times a day (QID) | INTRAMUSCULAR | Status: DC | PRN
Start: 2022-04-11 — End: 2022-04-11

## 2022-04-11 MED ORDER — ROCURONIUM BROMIDE 100 MG/10ML IV SOLN
INTRAVENOUS | Status: DC | PRN
  Administered 2022-04-11 (×2): 30 mg via INTRAVENOUS
  Administered 2022-04-11: 40 mg via INTRAVENOUS
  Administered 2022-04-11: 30 mg via INTRAVENOUS

## 2022-04-11 MED ORDER — HEPARIN 30,000 UNITS/1000 ML (OHS) CELLSAVER SOLUTION
Status: AC
  Filled 2022-04-11: qty 1000

## 2022-04-11 MED ORDER — MIDAZOLAM HCL 2 MG/2ML IJ SOLN
INTRAMUSCULAR | Status: AC
  Filled 2022-04-11: qty 2

## 2022-04-11 MED ORDER — PHENYLEPHRINE HCL (PRESSORS) 10 MG/ML IV SOLN
INTRAVENOUS | Status: DC | PRN
  Administered 2022-04-11 (×5): 80 ug via INTRAVENOUS
  Administered 2022-04-11: 160 ug via INTRAVENOUS

## 2022-04-11 MED ORDER — LACTATED RINGERS IV SOLN: INTRAVENOUS | Status: DC

## 2022-04-11 MED ORDER — FENTANYL CITRATE (PF) 100 MCG/2ML IJ SOLN
INTRAMUSCULAR | Status: DC | PRN
  Administered 2022-04-11 (×2): 25 ug via INTRAVENOUS
  Administered 2022-04-11: 50 ug via INTRAVENOUS

## 2022-04-11 MED ORDER — LACTATED RINGERS IV SOLN: INTRAVENOUS | Status: DC | PRN

## 2022-04-11 MED ORDER — PHENOL 1.4 % MT LIQD: 1.0000 | OROMUCOSAL | Status: DC | PRN

## 2022-04-11 MED ORDER — LABETALOL HCL 5 MG/ML IV SOLN
INTRAVENOUS | Status: AC
Start: 2022-04-11 — End: ?
  Filled 2022-04-11: qty 4

## 2022-04-11 MED ORDER — ONDANSETRON HCL 4 MG/2ML IJ SOLN
4.0000 mg | Freq: Four times a day (QID) | INTRAMUSCULAR | Status: DC | PRN
  Administered 2022-04-12: 4 mg via INTRAVENOUS
  Filled 2022-04-11: qty 2

## 2022-04-11 MED ORDER — ADULT MULTIVITAMIN W/MINERALS CH
1.0000 | ORAL_TABLET | Freq: Every day | ORAL | Status: DC
  Administered 2022-04-12 – 2022-04-13 (×2): 1 via ORAL
  Filled 2022-04-11 (×2): qty 1

## 2022-04-11 MED ORDER — OXYCODONE-ACETAMINOPHEN 5-325 MG PO TABS: 1.0000 | ORAL_TABLET | ORAL | Status: DC | PRN

## 2022-04-11 MED ORDER — FENTANYL CITRATE (PF) 100 MCG/2ML IJ SOLN
INTRAMUSCULAR | Status: AC
  Filled 2022-04-11: qty 2

## 2022-04-11 MED ORDER — ROCURONIUM BROMIDE 10 MG/ML (PF) SYRINGE
PREFILLED_SYRINGE | INTRAVENOUS | Status: AC
  Filled 2022-04-11: qty 10

## 2022-04-11 MED ORDER — NITROGLYCERIN IN D5W 200-5 MCG/ML-% IV SOLN: 5.0000 ug/min | INTRAVENOUS | Status: DC

## 2022-04-11 MED ORDER — ACETAMINOPHEN 650 MG RE SUPP: 325.0000 mg | RECTAL | Status: DC | PRN

## 2022-04-11 MED ORDER — ALBUMIN HUMAN 5 % IV SOLN: INTRAVENOUS | Status: DC | PRN

## 2022-04-11 MED ORDER — MAGNESIUM SULFATE 2 GM/50ML IV SOLN: 2.0000 g | Freq: Every day | INTRAVENOUS | Status: DC | PRN

## 2022-04-11 MED ORDER — ASPIRIN 81 MG PO TBEC
81.0000 mg | DELAYED_RELEASE_TABLET | Freq: Every day | ORAL | Status: DC
  Administered 2022-04-12 – 2022-04-13 (×2): 81 mg via ORAL
  Filled 2022-04-11 (×2): qty 1

## 2022-04-11 MED ORDER — MORPHINE SULFATE (PF) 2 MG/ML IV SOLN
2.0000 mg | INTRAVENOUS | Status: DC | PRN
  Administered 2022-04-12 (×2): 2 mg via INTRAVENOUS
  Filled 2022-04-11 (×2): qty 1

## 2022-04-11 MED ORDER — HEPARIN 5000 UNITS IN NS 1000 ML (FLUSH)
INTRAMUSCULAR | Status: DC | PRN
  Administered 2022-04-11: 1 via INTRAMUSCULAR

## 2022-04-11 MED ORDER — BOOST / RESOURCE BREEZE PO LIQD CUSTOM
1.0000 | Freq: Three times a day (TID) | ORAL | Status: DC
  Administered 2022-04-11 – 2022-04-13 (×5): 1 via ORAL

## 2022-04-11 MED ORDER — LIDOCAINE HCL (CARDIAC) PF 100 MG/5ML IV SOSY
PREFILLED_SYRINGE | INTRAVENOUS | Status: DC | PRN
  Administered 2022-04-11: 60 mg via INTRAVENOUS

## 2022-04-11 MED ORDER — HEPARIN SODIUM (PORCINE) 5000 UNIT/ML IJ SOLN
INTRAMUSCULAR | Status: AC
  Filled 2022-04-11: qty 1

## 2022-04-11 MED ORDER — LABETALOL HCL 5 MG/ML IV SOLN
INTRAVENOUS | Status: DC | PRN
  Administered 2022-04-11: 5 mg via INTRAVENOUS
  Administered 2022-04-11: 10 mg via INTRAVENOUS

## 2022-04-11 MED ORDER — CHLORHEXIDINE GLUCONATE CLOTH 2 % EX PADS
6.0000 | MEDICATED_PAD | Freq: Once | CUTANEOUS | Status: AC
  Administered 2022-04-11: 6 via TOPICAL

## 2022-04-11 MED ORDER — SODIUM CHLORIDE 0.9 % IV SOLN: INTRAVENOUS | Status: DC

## 2022-04-11 MED ORDER — ONDANSETRON HCL 4 MG/2ML IJ SOLN
INTRAMUSCULAR | Status: DC | PRN
  Administered 2022-04-11: 4 mg via INTRAVENOUS

## 2022-04-11 MED ORDER — CEFAZOLIN SODIUM-DEXTROSE 2-4 GM/100ML-% IV SOLN
2.0000 g | INTRAVENOUS | Status: AC
  Administered 2022-04-11: 2 g via INTRAVENOUS
  Filled 2022-04-11: qty 100

## 2022-04-11 MED ORDER — HEPARIN SODIUM (PORCINE) 1000 UNIT/ML IJ SOLN
INTRAMUSCULAR | Status: DC | PRN
  Administered 2022-04-11: 6000 [IU] via INTRAVENOUS
  Administered 2022-04-11: 1000 [IU] via INTRAVENOUS

## 2022-04-11 MED ORDER — ACETAMINOPHEN 325 MG PO TABS: 325.0000 mg | ORAL_TABLET | ORAL | Status: DC | PRN

## 2022-04-11 MED ORDER — SODIUM CHLORIDE FLUSH 0.9 % IV SOLN
INTRAVENOUS | Status: AC
  Filled 2022-04-11: qty 10

## 2022-04-11 MED ORDER — OXYCODONE HCL 5 MG/5ML PO SOLN: 5.0000 mg | Freq: Once | ORAL | Status: DC | PRN

## 2022-04-11 MED ORDER — SODIUM CHLORIDE 0.9 % IV SOLN: 500.0000 mL | Freq: Once | INTRAVENOUS | Status: DC | PRN

## 2022-04-11 MED ORDER — POTASSIUM CHLORIDE CRYS ER 20 MEQ PO TBCR: 20.0000 meq | EXTENDED_RELEASE_TABLET | Freq: Every day | ORAL | Status: DC | PRN

## 2022-04-11 MED ORDER — SODIUM CHLORIDE 0.9 % IV SOLN: INTRAVENOUS | Status: DC | PRN

## 2022-04-11 MED ORDER — HYDROMORPHONE HCL 1 MG/ML IJ SOLN: 1.0000 mg | Freq: Once | INTRAMUSCULAR | Status: DC | PRN

## 2022-04-11 MED ORDER — SORBITOL 70 % SOLN
30.0000 mL | Freq: Every day | Status: DC | PRN
Start: 2022-04-11 — End: 2022-04-13

## 2022-04-11 MED ORDER — "VISTASEAL 4 ML SINGLE DOSE KIT "
PACK | CUTANEOUS | Status: DC | PRN
  Administered 2022-04-11: 4 mL via TOPICAL

## 2022-04-11 MED ORDER — ALUM & MAG HYDROXIDE-SIMETH 200-200-20 MG/5ML PO SUSP: 15.0000 mL | ORAL | Status: DC | PRN

## 2022-04-11 MED ORDER — ONDANSETRON HCL 4 MG/2ML IJ SOLN: 4.0000 mg | Freq: Once | INTRAMUSCULAR | Status: DC | PRN

## 2022-04-11 MED ORDER — 0.9 % SODIUM CHLORIDE (POUR BTL) OPTIME
TOPICAL | Status: DC | PRN
  Administered 2022-04-11: 500 mL

## 2022-04-11 MED ORDER — MIDAZOLAM HCL 2 MG/2ML IJ SOLN
INTRAMUSCULAR | Status: DC | PRN
  Administered 2022-04-11: 1 mg via INTRAVENOUS

## 2022-04-11 MED ORDER — LIDOCAINE HCL (PF) 2 % IJ SOLN
INTRAMUSCULAR | Status: AC
  Filled 2022-04-11: qty 5

## 2022-04-11 MED ORDER — CEFAZOLIN SODIUM-DEXTROSE 2-4 GM/100ML-% IV SOLN
2.0000 g | Freq: Three times a day (TID) | INTRAVENOUS | Status: AC
  Administered 2022-04-11 – 2022-04-12 (×2): 2 g via INTRAVENOUS
  Filled 2022-04-11 (×2): qty 100

## 2022-04-11 MED ORDER — HYDROMORPHONE HCL 1 MG/ML IJ SOLN
INTRAMUSCULAR | Status: AC
  Filled 2022-04-11: qty 1

## 2022-04-11 MED ORDER — DOPAMINE-DEXTROSE 3.2-5 MG/ML-% IV SOLN: 3.0000 ug/kg/min | INTRAVENOUS | Status: DC

## 2022-04-11 MED ORDER — CEFAZOLIN SODIUM-DEXTROSE 2-4 GM/100ML-% IV SOLN
INTRAVENOUS | Status: AC
  Filled 2022-04-11: qty 100

## 2022-04-11 MED ORDER — ALBUTEROL SULFATE HFA 108 (90 BASE) MCG/ACT IN AERS
INHALATION_SPRAY | RESPIRATORY_TRACT | Status: AC
Start: 2022-04-11 — End: ?
  Filled 2022-04-11: qty 6.7

## 2022-04-11 MED ORDER — METOPROLOL TARTRATE 5 MG/5ML IV SOLN: 2.0000 mg | INTRAVENOUS | Status: DC | PRN

## 2022-04-11 MED ORDER — ACETAMINOPHEN 10 MG/ML IV SOLN: 1000.0000 mg | Freq: Once | INTRAVENOUS | Status: DC | PRN

## 2022-04-11 MED ORDER — CHLORHEXIDINE GLUCONATE CLOTH 2 % EX PADS
6.0000 | MEDICATED_PAD | Freq: Every day | CUTANEOUS | Status: DC
  Administered 2022-04-11 – 2022-04-12 (×2): 6 via TOPICAL

## 2022-04-11 MED ORDER — ENOXAPARIN SODIUM 40 MG/0.4ML IJ SOSY
40.0000 mg | PREFILLED_SYRINGE | INTRAMUSCULAR | Status: DC
  Administered 2022-04-13: 40 mg via SUBCUTANEOUS
  Filled 2022-04-11 (×2): qty 0.4

## 2022-04-11 SURGICAL SUPPLY — 93 items
"PENCIL ELECTRO HAND CTR " (MISCELLANEOUS) IMPLANT
ADH SKN CLS APL DERMABOND .7 (GAUZE/BANDAGES/DRESSINGS) ×2
APL PRP STRL LF DISP 70% ISPRP (MISCELLANEOUS) ×2
BAG DECANTER FOR FLEXI CONT (MISCELLANEOUS) ×4 IMPLANT
BAG ISL LRG 20X20 DRWSTRG (DRAPES)
BAG ISOLATATION DRAPE 20X20 ST (DRAPES) IMPLANT
BLADE SURG 15 STRL LF DISP TIS (BLADE) ×3 IMPLANT
BLADE SURG 15 STRL SS (BLADE) ×3
BLADE SURG SZ11 CARB STEEL (BLADE) ×4 IMPLANT
BOOT SUTURE AID YELLOW STND (SUTURE) ×4 IMPLANT
BRUSH SCRUB EZ  4% CHG (MISCELLANEOUS) ×3
BRUSH SCRUB EZ 4% CHG (MISCELLANEOUS) ×3 IMPLANT
CANNULA 5F STIFF (CANNULA) ×1 IMPLANT
CATH ANGIO 5F PIGTAIL 65CM (CATHETERS) ×1 IMPLANT
CATH BEACON 5 .035 40 KMP TP (CATHETERS) IMPLANT
CATH BEACON 5 .038 40 KMP TP (CATHETERS) ×3
CHLORAPREP W/TINT 26 (MISCELLANEOUS) ×4 IMPLANT
DERMABOND ADVANCED (GAUZE/BANDAGES/DRESSINGS) ×1
DERMABOND ADVANCED .7 DNX12 (GAUZE/BANDAGES/DRESSINGS) ×3 IMPLANT
DEVICE STARCLOSE SE CLOSURE (Vascular Products) ×1 IMPLANT
DRAPE C-ARM XRAY 36X54 (DRAPES) ×4 IMPLANT
DRAPE INCISE IOBAN 66X45 STRL (DRAPES) ×4 IMPLANT
DRAPE ISOLATE BAG 20X20 STRL (DRAPES)
DRSG OPSITE POSTOP 4X6 (GAUZE/BANDAGES/DRESSINGS) IMPLANT
ELECT CAUTERY BLADE 6.4 (BLADE) ×4 IMPLANT
ELECT REM PT RETURN 9FT ADLT (ELECTROSURGICAL) ×3
ELECTRODE REM PT RTRN 9FT ADLT (ELECTROSURGICAL) ×3 IMPLANT
GAUZE 4X4 16PLY ~~LOC~~+RFID DBL (SPONGE) ×4 IMPLANT
GLIDEWIRE ADV .035X180CM (WIRE) ×1 IMPLANT
GLOVE BIO SURGEON STRL SZ7 (GLOVE) ×8 IMPLANT
GOWN STRL REUS W/ TWL LRG LVL3 (GOWN DISPOSABLE) ×3 IMPLANT
GOWN STRL REUS W/ TWL XL LVL3 (GOWN DISPOSABLE) ×6 IMPLANT
GOWN STRL REUS W/TWL LRG LVL3 (GOWN DISPOSABLE) ×3
GOWN STRL REUS W/TWL XL LVL3 (GOWN DISPOSABLE) ×6
HEMOSTAT SURGICEL 2X3 (HEMOSTASIS) ×4 IMPLANT
INTRODUCER 7FR 23CM (INTRODUCER) ×1 IMPLANT
IV NS 500ML (IV SOLUTION) ×3
IV NS 500ML BAXH (IV SOLUTION) ×3 IMPLANT
KIT ENCORE 26 ADVANTAGE (KITS) ×2 IMPLANT
KIT TURNOVER KIT A (KITS) ×4 IMPLANT
LABEL OR SOLS (LABEL) ×4 IMPLANT
LOOP RED MAXI  1X406MM (MISCELLANEOUS) ×3
LOOP VESSEL MAXI  1X406 RED (MISCELLANEOUS) ×6
LOOP VESSEL MAXI 1X406 RED (MISCELLANEOUS) ×6 IMPLANT
LOOP VESSEL MINI 0.8X406 BLUE (MISCELLANEOUS) ×6 IMPLANT
LOOPS BLUE MINI 0.8X406MM (MISCELLANEOUS) ×2
MANIFOLD NEPTUNE II (INSTRUMENTS) ×4 IMPLANT
NDL ENTRY 21GA 7CM ECHOTIP (NEEDLE) IMPLANT
NDL SAFETY ECLIPSE 18X1.5 (NEEDLE) ×3 IMPLANT
NEEDLE ENTRY 21GA 7CM ECHOTIP (NEEDLE) ×3 IMPLANT
NEEDLE HYPO 18GX1.5 SHARP (NEEDLE) ×3
NS IRRIG 500ML POUR BTL (IV SOLUTION) ×3 IMPLANT
PACK ANGIOGRAPHY (CUSTOM PROCEDURE TRAY) ×1 IMPLANT
PACK BASIN MAJOR ARMC (MISCELLANEOUS) ×4 IMPLANT
PACK UNIVERSAL (MISCELLANEOUS) ×4 IMPLANT
PATCH CAROTID ECM VASC 1X10 (Prosthesis & Implant Heart) ×1 IMPLANT
PENCIL ELECTRO HAND CTR (MISCELLANEOUS) ×1 IMPLANT
RETRACTOR TRAXI PANNICULUS (MISCELLANEOUS) IMPLANT
SET INTRO CAPELLA COAXIAL (SET/KITS/TRAYS/PACK) ×1 IMPLANT
SET WALTER ACTIVATION W/DRAPE (SET/KITS/TRAYS/PACK) ×4 IMPLANT
SHEATH BRITE TIP 6FRX11 (SHEATH) ×2 IMPLANT
SPONGE T-LAP 18X18 ~~LOC~~+RFID (SPONGE) ×8 IMPLANT
STENT LIFESTREAM 6X37X80 (Permanent Stent) ×1 IMPLANT
STENT LIFESTREAM 6X58X135 (Permanent Stent) ×1 IMPLANT
STENT LIFESTREAM 6X58X80 (Permanent Stent) ×2 IMPLANT
STENT VIABAHN 7X50X120 (Permanent Stent) ×3 IMPLANT
STENT VIABAHN 7X5X120 7FR (Permanent Stent) IMPLANT
SUT MNCRL 4-0 (SUTURE) ×3
SUT MNCRL 4-0 27XMFL (SUTURE) ×2
SUT PROLENE 5 0 RB 1 DA (SUTURE) ×8 IMPLANT
SUT PROLENE 6 0 BV (SUTURE) ×16 IMPLANT
SUT PROLENE 7 0 BV 1 (SUTURE) ×11 IMPLANT
SUT SILK 2 0 (SUTURE) ×3
SUT SILK 2-0 18XBRD TIE 12 (SUTURE) ×3 IMPLANT
SUT SILK 3 0 (SUTURE) ×3
SUT SILK 3-0 18XBRD TIE 12 (SUTURE) ×3 IMPLANT
SUT SILK 4 0 (SUTURE) ×3
SUT SILK 4-0 18XBRD TIE 12 (SUTURE) ×3 IMPLANT
SUT VIC AB 2-0 CT1 27 (SUTURE) ×6
SUT VIC AB 2-0 CT1 TAPERPNT 27 (SUTURE) ×6 IMPLANT
SUT VIC AB 3-0 SH 27 (SUTURE) ×3
SUT VIC AB 3-0 SH 27X BRD (SUTURE) ×3 IMPLANT
SUT VIC AB 4-0 PS2 27 (SUTURE) ×1 IMPLANT
SUT VICRYL+ 3-0 36IN CT-1 (SUTURE) ×8 IMPLANT
SUTURE MNCRL 4-0 27XMF (SUTURE) ×3 IMPLANT
SYR 20ML LL LF (SYRINGE) ×4 IMPLANT
SYR 5ML LL (SYRINGE) ×4 IMPLANT
TRAXI PANNICULUS RETRACTOR (MISCELLANEOUS)
TRAY FOLEY MTR SLVR 16FR STAT (SET/KITS/TRAYS/PACK) ×4 IMPLANT
WATER STERILE IRR 500ML POUR (IV SOLUTION) ×3 IMPLANT
WIRE G V18X300CM (WIRE) ×1 IMPLANT
WIRE GUIDERIGHT .035X150 (WIRE) ×2 IMPLANT
WIRE MAGIC TOR.035 180C (WIRE) ×1 IMPLANT

## 2022-04-11 NOTE — Op Note (Addendum)
OPERATIVE NOTE   PROCEDURE: 1.   Left common femoral, profunda femoris, and superficial femoral artery endarterectomies and patch angioplasty 2.   Catheter placement into aorta from bilateral femoral approaches 3.  Ultrasound guidance for vascular access right femoral artery 4.  Stent placement to bilateral common iliac arteries with 6 mm diameter by 37 mm length lifestream stent on the right and 6 mm diameter by 58 mm length lifestream stent on the left 5.  Angioplasty of the infrarenal abdominal aorta with 6 mm balloons from both sides 6.  Additional stent placement to the left external iliac artery with a pair of 6 mm diameter by 58 mm length lifestream stents and a 7 mm diameter by 50 mm length Viabahn stent    PRE-OPERATIVE DIAGNOSIS: 1.Atherosclerotic occlusive disease left lower extremities with rest pain on the left and claudication on the right 2.  Recent stroke with severe extracranial cerebrovascular disease  POST-OPERATIVE DIAGNOSIS: Same  SURGEON: [name], MD  CO-surgeon: Hortencia Pilar, MD  ANESTHESIA:  general  ESTIMATED BLOOD LOSS: 100 cc  FINDING(S): 1.  significant plaque in left common femoral, profunda femoris, and superficial femoral arteries 2.  Moderate stenosis at the proximal edge of the previously placed right common iliac stent in the distal aorta with occlusion of the left common and external iliac arteries.  SPECIMEN(S):  Left common femoral, profunda femoris, and superficial femoral artery plaque.  INDICATIONS:    Patient presents with severe rest pain symptoms of the left lower extremity with occlusion of her previous iliac intervention and occlusion of the left common femoral, profunda femoris, and superficial femoral artery.  Left femoral endarterectomy is planned to try to improve perfusion.  This is going to be done in combination with aortoiliac intervention.  The risks and benefits as well as alternative therapies including intervention were  reviewed in detail all questions were answered the patient agrees to proceed with surgery.  DESCRIPTION: After obtaining full informed written consent, the patient was brought back to the operating room and placed supine upon the operating table.  The patient received IV antibiotics prior to induction.  After obtaining adequate anesthesia, the patient was prepped and draped in the standard fashion appropriate time out is called.    Vertical incision was created overlying the left femoral arteries. The common femoral artery proximally, and superficial femoral artery, and primary profunda femoris artery branches were encircled with vessel loops and prepared for control. The left femoral arteries were found to have significant plaque from the common femoral artery into the profunda and superficial femoral arteries.   6000 units of heparin was given and allowed circulate for 5 minutes.   Attention is then turned to the left femoral artery.  An arteriotomy is made with 11 blade and extended with Potts scissors in the common femoral artery and carried down onto the first 3-4 cm of the profunda femoris artery artery.  There was extensive disease that tracked down beyond the primary branch of the profunda femoris artery into a secondary profunda femoris artery branch and the endarterectomy had to be carried down to that point as did the arteriotomy.  An endarterectomy was then performed. The [facility] was used to create a plane. The proximal endpoint was cut flush with tenotomy scissors. This was in the proximal common femoral artery. An eversion endarterectomy was then performed for the first 2-3 cm of the superficial femoral artery. The distal endpoint of the profunda femoris artery endarterectomy was created with gentle traction and the  distal endpoint was tacked down with a pair of 7-0 Prolene sutures.  The Cormatrix patcth is then selected and prepared for a patch angioplasty.  It is cut and beveled  and started at the proximal endpoint with a 6-0 Prolene suture.  Approximately one half of the suture line is run medially and laterally and the distal end point was cut and bevelled to match the arteriotomy.  A second 6-0 Prolene was started at the distal end point and run to the mid portion to complete the arteriotomy.  The vessel was flushed prior to release of control and completion of the anastomosis.  At this point, flow was established first to the profunda femoris artery and then to the superficial femoral artery.  We then turned our attention to the aortoiliac disease to be addressed.  Dr. Delana Meyer access the right femoral artery under direct ultrasound guidance and placed a 6 French sheath on the right.  I accessed the patch on the left side with a Seldinger needle and placed a 7 French sheath up the left side.  Was able to easily cross the occlusion of the left external and common iliac arteries with a Kumpe catheter and advantage wire and confirm intraluminal flow in the aorta.  Dr. Delana Meyer put a pigtail catheter up into the aorta from the right side and perform selective imaging.  There appeared to be a moderate lesion at the proximal edge of the right iliac stent but the left common and external iliac arteries were occluded.  There is about a 2 cm span between the iliac stents and the aortic stents and there is narrowing of the aorta within the previously placed stent that appeared significant.  From the right side, Dr. Delana Meyer placed a 6 mm diameter by 37 mm Lifestream stent in the right common iliac artery up to the distal aorta to breach the previously placed stent in the aorta.  I used a 6 mm diameter by 58 mm length lifestream stent on the left and these were deployed with the same proximal edge into the distal aorta and the distal portion of the previously placed stent and down into the common iliac arteries.  We then took the 6 mm balloon simultaneously up into the aorta to perform balloon  angioplasty of the aorta to stretch the narrowed previous aortic stent.  Both are 6 mm balloons were inflated simultaneously to dilate the area to 12 mm.  On the right side, there was less than 10% residual stenosis in the right common iliac artery and the aorta had less than 20% residual stenosis after we ballooned it and StarClose closure device could be deployed on the right.  On the left side, several additional stents need to be placed to address the residual occlusion of the left external iliac artery down to the top of the left femoral head at the proximal portion of our endarterectomy site.  A pair of additional 6 mm diameter by 58 mm length lifestream stents were taken first across the iliac bifurcation into the proximal left external iliac artery and then the more distal end was entirely in the left external iliac artery down to the mid to distal segment.  We then exchanged for a 0.018 wire and finally placed a 7 mm diameter by 50 mm length Viabahn stent in the left external iliac artery down to the top of the left femoral head.  This was postdilated with a 6 mm balloon with excellent angiographic completion result and less than 10%  residual stenosis in the left iliac system after stent placement. The sheath was then removed from the left femoral patch with a 5-0 Prolene suture pursestring placed for hemostasis.  Several 6-0 Prolene patch sutures were used for hemostasis as needed and there was an excellent pulse in the profunda femoris artery beyond the endarterectomy site.  Surgicel and Evicel topical hemostatic agents were placed in the femoral incision and hemostasis was complete. The femoral incision was then closed in a layered fashion with 2 layers of 2-0 Vicryl, 2 layers of 3-0 Vicryl, and staples with a Prevena VAC for the skin closure.   The patient was then awakened from anesthesia and taken to the recovery room in stable condition having tolerated the procedure well.  COMPLICATIONS:  None  CONDITION: Stable     [name] 04/11/2022 1:28 PM   This note was created with Dragon Medical transcription system. Any errors in dictation are purely unintentional.

## 2022-04-11 NOTE — Transfer of Care (Signed)
Immediate Anesthesia Transfer of Care Note  Patient: Autumn Arnold  Procedure(s) Performed: ENDARTERECTOMY FEMORAL (Left) INSERTION OF ILIAC STENT (Left) APPLICATION OF CELL SAVER  Patient Location: PACU  Anesthesia Type:General  Level of Consciousness: awake, alert  and oriented  Airway & Oxygen Therapy: Patient Spontanous Breathing and Patient connected to face mask oxygen  Post-op Assessment: Report given to RN, Post -op Vital signs reviewed and stable and Patient moving all extremities  Post vital signs: Reviewed and stable  Last Vitals:  Vitals Value Taken Time  BP 158/55 04/11/22 1329  Temp    Pulse 87 04/11/22 1329  Resp 12 04/11/22 1329  SpO2 100 % 04/11/22 1329  Vitals shown include unvalidated device data.  Last Pain:  Vitals:   04/11/22 0942  TempSrc:   PainSc: 0-No pain         Complications: No notable events documented.

## 2022-04-11 NOTE — Anesthesia Procedure Notes (Signed)
Procedure Name: Intubation Date/Time: 04/11/2022 10:15 AM Performed by: Esaw Grandchild, CRNA Pre-anesthesia Checklist: Patient identified, Emergency Drugs available, Suction available and Patient being monitored Patient Re-evaluated:Patient Re-evaluated prior to induction Oxygen Delivery Method: Circle system utilized Preoxygenation: Pre-oxygenation with 100% oxygen Induction Type: IV induction Ventilation: Mask ventilation without difficulty Laryngoscope Size: McGraph and 3 Grade View: Grade I Tube type: Oral Number of attempts: 1 Airway Equipment and Method: Stylet and Oral airway Placement Confirmation: ETT inserted through vocal cords under direct vision, positive ETCO2 and breath sounds checked- equal and bilateral Secured at: 22 cm Tube secured with: Tape Dental Injury: Teeth and Oropharynx as per pre-operative assessment  Comments: Intubated by Everlene Other SRNA

## 2022-04-11 NOTE — Interval H&P Note (Signed)
History and Physical Interval Note:  04/11/2022 9:21 AM  Autumn Arnold  has presented today for surgery, with the diagnosis of Left.  The various methods of treatment have been discussed with the patient and family. After consideration of risks, benefits and other options for treatment, the patient has consented to  Procedure(s): ENDARTERECTOMY FEMORAL (Left) INSERTION OF ILIAC STENT (Left) APPLICATION OF CELL SAVER (N/A) as a surgical intervention.  The patient's history has been reviewed, patient examined, no change in status, stable for surgery.  I have reviewed the patient's chart and labs.  Questions were answered to the patient's satisfaction.     [name]

## 2022-04-11 NOTE — Op Note (Signed)
OPERATIVE NOTE   PROCEDURE: Left common femoral, superficial femoral and profunda femoris endarterectomy with Cormatrix patch angioplasty. Stent placement bilateral common iliac arteries. Stent placement left external iliac artery Angioplasty of the infrarenal abdominal aorta Ultrasound-guided access to the right common femoral artery   PRE-OPERATIVE DIAGNOSIS: Atherosclerotic occlusive disease bilateral lower extremity with lifestyle limiting claudication bilaterally and left sided rest pain symptoms  POST-OPERATIVE DIAGNOSIS: Same  CO-SURGEON: Katha Cabal, MD and Algernon Huxley, M.D.  ASSISTANT(S): None  ANESTHESIA: general  ESTIMATED BLOOD LOSS: 100 cc  FINDING(S): Profound calcific plaque noted of the left common femoral extending past the initial bifurcation of the profunda femoris arteries as well as down the extensive length of the SFA  SPECIMEN(S):  Calcific plaque from the common femoral, superficial femoral and the profunda femoris artery  INDICATIONS:   Marylou Mccoy 71 y.o. y.o.female who presents with complaints of lifestyle limiting claudication bilaterally and rest pain continuously in the left lower extremity. The patient has documented severe atherosclerotic occlusive disease and has undergone minimally invasive treatments in the past. However, at this point his primary area of stricture stenosis resides in the common femoral and origins of the superficial femoral and profunda femoris extending into these arteries and therefore this is not amenable to intervention. The patient is therefore undergoing open endarterectomy. The risks and benefits of surgery have been reviewed with the patient, all questions have answered; alternative therapies have been reviewed as well and the patient has agreed to proceed with surgical open repair.  DESCRIPTION: After obtaining full informed written consent, the patient was brought  back to the operating room and placed supine upon the operating table.  The patient received IV antibiotics prior to induction.  After obtaining adequate anesthesia, the patient was prepped and draped in the standard fashion for left femoral exposure.    Co-surgeons are required because this is a complicated procedure with work being performed simultaneously from both the patient's right left sides.  This also expedites the procedure making a shorter operative time reducing complications and improving patient safety.  Attention was turned to the left groin with Dr. Lucky Cowboy working on the patient's left and myself working on the right of the patient.  Vertical  Incision was made over the left common femoral artery and dissection carried down to the common femoral artery with electrocautery.  I dissected out the common femoral artery from the distal external iliac artery (identified by the superficial circumflex vessels) down to the femoral bifurcation.  On initial inspection, the common femoral artery was: densely calcified and there was no palpable pulse noted.    Subsequently the dissection was continued to include all circumflex branches and the profunda femoral artery and superficial femoral artery. The superficial femoral artery was dissected circumferentially for a distance of approximately 3-4 cm and the profunda femoris was dissected circumferentially out to the fourth order branches individual vessel loops were placed around each branch.  Control of all branches was obtained with vessel loops.  A softer area in the distal external iliac artery amendable to clamping was identified.    The patient was given 5000 units of Heparin intravenously, which was a therapeutic bolus.   After waiting 3 minutes, the distal external iliac artery was clamped and all of the vessel loops were placed under tension.  Arteriotomy was made in the common femoral artery with a 11-blade and extended it with a Potts scissor  proximally and distally extending the distal end down the profunda femoris  for approximately 3 cm distal into the main trunk past the fourth order branches.   Endarterectomy was then performed under direct visualization using a freer elevator and a right angle from the mid common femoral extending up both proximally and distally. Proximally the endarterectomy was brought up to the level of the clamp where a clean edge was obtained. Distally the endarterectomy was carried down to a soft spot in the profunda femoris where a feathered edge would was obtained.  7-0 Prolene interrupted tacking sutures were placed to secure the leading edge of the plaque in the profunda femoris.  The SFA was treated with an eversion technique extending endarterectomy approximately 2 cm distally again obtaining a featheredge on both sides right and left.   At this point, a corematrix patch was fashioned for the geometry of the arteriotomy.  The patch was sewn to the artery with 2 running stitches of 6-0 Prolene, running from each end.  Prior to completing the patch angioplasty, the profunda femoral artery was flushed as was the superficial femoral artery. The system was then forward flushed. The endarterectomy site was then irrigated copiously with heparinized saline. The patch angioplasty was completed in the usual fashion.  Flow was then reestablished first to the profunda femoris and then the superficial femoral artery. Any gaps or bleeding sites in the suture line were easily controlled with a 6-0 Prolene suture.   Attention was then turned to the endovascular portion of the case.  Ultrasound was brought to the field in a sterile sleeve.  The right common femoral artery was assessed and was echolucent and pulsatile indicating patency.  Images recorded for the permanent record.  A microneedle was inserted under direct ultrasound visualization followed by microwire micro sheath J-wire and then a 6 French sheath.  At the same time  a Seldinger needle was used to access the patch on the left common femoral and an advantage wire was advanced under fluoroscopic guidance after which a 6 French sheath was then placed.  This left-sided stent was then upsized to a 7 [place] sheath when we began our intervention.  Pigtail catheter was advanced up the right side and contrast was used to image the aorta bilateral common iliacs.  This image demonstrated severe greater than 60% stenosis within the aortic stent in its midportion.  There was greater than 80% stenosis in the right common iliac artery.  The left common and external iliac arteries appeared occluded throughout their entirety.  These findings were all consistent with a diagnostic angiogram performed April 05, 2022.  A Magic torque wire was then advanced up the right side and the advantage wire was used to cross the occlusion on the left.  Kumpe catheter was advanced up the left verifying intraluminal positioning with hand-injection of contrast.  Working over these 2 wires initially we treated the common iliac stenoses.  A 6 mm x 37 mm lifestream stent was advanced up both the right and a 6 mm x 58 mm lifestream stent was advanced up the left side positioning the proximal edges proximal to the previous stents.  They were deployed subcutaneously to approximately 10 atm each.  Balloons were deflated and then repositioned to treat a aortic lesion which was a separate and distinct lesion.  Simultaneous inflation using the 6 mm balloons was performed again to approximately 10 atm for approximately 1 minute.  Follow-up imaging through the right sheath now demonstrated that the right common iliac artery was well treated with less than 10% residual stenosis.  There is less than 10% residual stenosis in the infrarenal abdominal aorta as well.  Attention was then turned to completing the intervention on the left side.  An additional two 6 mm x 58 mm lifestream stents were deployed followed by a 7 mm x 50  mm Viabahn stent which extended down so that the distal edge of the Viabahn was at the level of the left ilioinguinal ligament.  Inflations for the lifestream stents were to approximately 10 atm for approximately 1 minute.  Viabahn stent was deployed and then postdilated with one of the 6 mm balloons from the Lifestream stent.  Follow-up imaging from the left femoral approach now demonstrated wide patency of the common iliac and external iliac arteries.  There is less than 10% residual stenosis.  It also demonstrated wide patency of the profunda femoris reconstruction.  At this point we decided to end the case.  A oblique view of the right groin was obtained and a Star close device deployed light pressure was held.  The left groin was then irrigated copiously with sterile saline and subsequently Evicel and Surgicel were placed in the wound. The incision was repaired with a double layer of 2-0 Vicryl, a double layer of 3-0 Vicryl, and staples were utilized with a Prevena VAC incisional dressing.  COMPLICATIONS: None  CONDITION: Carlynn Purl, M.D. [facility] Office: [phone]  04/11/2022, 1:23 PM

## 2022-04-11 NOTE — Care Management (Deleted)
Pre-rounds chart review and preliminary plan / goals for today: Significant overnight events per chart: none Significant new findings on labs/other diagnostics: none, no concerns on VS or AM labs  Dispo plan: TOC following. Will need HH PT/OT Pending/Plan: vascular procedure today, if vascular clears her for discharge may be appropriate to d/c home this afternoon if appropriate services in place, appreciate vascular recs and will also follow w/ TOC   Please feel free to reach out via secure chat in Epic for non-urgent issues. Please page for urgent matters!  This note will be updated after rounds and pending the above recommendations, will enter full progress note / discharge note as appropriate

## 2022-04-11 NOTE — Progress Notes (Signed)
PT Cancellation Note  Patient Details Name: Autumn Arnold MRN: [id number] DOB: 22-Jan-1951   Cancelled Treatment:    Reason Eval/Treat Not Completed: Other (comment). Pt currently out of room for procedure, not available for therapy at this time. Due to change in status, will need new orders for therapy to resume treatment. Will cancel current order at this time. Please re-order when medically stable and able to participate.   Somya Jauregui 04/11/2022, 10:36 AM Greggory Stallion, PT, DPT, GCS [phone]

## 2022-04-11 NOTE — Anesthesia Procedure Notes (Signed)
Arterial Line Insertion Start/End6/05/2022 10:25 AM, 04/11/2022 10:33 AM Performed by: Darrin Nipper, MD, anesthesiologist  Patient location: Pre-op. Preanesthetic checklist: patient identified, IV checked, site marked, risks and benefits discussed, surgical consent, monitors and equipment checked, pre-op evaluation, timeout performed and anesthesia consent Patient sedated Right, radial was placed Catheter size: 20 G Hand hygiene performed   Attempts: 2 Procedure performed using ultrasound guided technique. Ultrasound Notes:anatomy identified, needle tip was noted to be adjacent to the nerve/plexus identified and no ultrasound evidence of intravascular and/or intraneural injection Following insertion, dressing applied and Biopatch. Post procedure assessment: normal and unchanged  Patient tolerated the procedure well with no immediate complications.

## 2022-04-11 NOTE — Progress Notes (Signed)
PROGRESS NOTE    ETSUKO DIEROLF  TZG:[id number] DOB: May 26, 1951  DOA: 04/04/2022 Date of Service: 04/11/22 PCP: [name], MD     Brief Narrative / Hospital Course:  71 year old F with PMH of CVA, OSA, DM-2, HTN, PVD s/p aortoiliac and SMA stent on DAPT and mild cognitive deficits, anxiety and tobacco use disorder presenting with altered mental status, blurry vision, aphasia and left arm numbness and weakness, and admitted 05/31/230for CVA evaluation.  She was hypotensive to 70s/40s.  MRI brain concerning for right frontoparietal acute to early subacute ischemic CVA.  Reportedly, her symptoms resolved in ED except for some residual left arm weakness and numbness.  There was also concern about critical left limb ischemia.  Neurology and vascular surgery consulted.  The next day, CTA head and neck showed unchanged occlusion of right common carotid and plaque at the proximal right and left ICA causing less than 50% stenosis.  No new occlusion or hemodynamically significant stenosis noted.  TTE, carotid US and lower extremity angiography ordered. TTE with without significant finding.  Angiograph showed occluded right CCA filling through collaterals, 80 to 90% stenosis of the right subclavian artery, occluded left common iliac artery, patent right common iliac artery stent.  Cardiology cleared patient for revascularization.  Vascular surgery performed endarterectomy today 04/11/22.  Consultants:  Neurology Vascular surgery Cardiology   Procedures: 04/11/22: Left common femoral, profunda femoris, and superficial femoral artery endarterectomies and patch angioplasty, Stent placement to bilateral common iliac arteries and left external iliac artery, balloon angioplasty of the infrarenal abdominal aorta    Subjective: Patient reports feeling well after her endarterectomy earlier today, denies CP/SOB, no HA/VC, no dizziness, no severe pain or temperature changes in lower extremities.       ASSESSMENT & PLAN:   Principal Problem:   Acute CVA (cerebrovascular accident) ([place]) Active Problems:   Critical limb ischemia of left lower extremity (HCC)   Hypotension   Carotid artery occlusion with infarction ([place])   Type 2 diabetes mellitus with hyperlipidemia (HCC)   Iron deficiency anemia   Anxiety and depression   OSA (obstructive sleep apnea)   Tobacco dependence   COPD (chronic obstructive pulmonary disease) (HCC)   Hypomagnesemia   COPD (chronic obstructive pulmonary disease) (HCC) Stable.  Over 60-pack-year history.  Currently smokes about a pack a day.  Discussed the importance of smoking cessation.  -Nicotine patch as needed  History of CVA (cerebrovascular accident)    TIA (transient ischemic attack)    Type 2 diabetes mellitus with hyperlipidemia (HCC) A1c 7.6%. Continue SSI and statin   Tobacco dependence Nicotine patch and tobacco cessation counseling  Iron deficiency anemia H&H relatively stable.  No report of melena or hematochezia.  Anemia panel suggests iron deficiency. -IV ferric gluconate 250 mg x 1 -Monitor H&H   Anxiety and depression Stable.  Continue buspirone, sertraline and amitriptyline  Critical limb ischemia of left lower extremity (HCC) PVD s/p aorto iliac and SMA stent 06/2021.  She has some left foot erythema with no palpable DP or PT pulse on my exam. Angiograph showed occluded right CCA filling through collaterals, 80 to 90% stenosis of the right subclavian artery, occluded left common iliac artery, patent right common iliac artery stent. -Continue Plavix, aspirin and atorvastatin. -Pain control with as needed Tylenol, tramadol and oxycodone -Encouraged smoking cessation -Cardiology cleared patient for revascularization.   -stenting and angioplasty today 04/11/22   Hypotension BP was 70/48.  Resolved. -Continue reduced dose of metoprolol -Continue IV fluid  Acute CVA (  cerebrovascular accident) [facility]) MRI  concerning for acute to early subacute right frontoparietal CVA.  She seems to have residual left lower extremity weakness.  Per family, speech is back to baseline.  CTA head and neck with chronic right CCA occlusion.  TTE without significant finding.  LDL 64.  A1c 7.6%.   -SBP goal 120s to 140s.  Out of permissive HTN window. -Continue statin, Plavix and aspirin. -Encourage tobacco cessation. -Continue therapy    OSA (obstructive sleep apnea) Continue oxygen at night  Hypomagnesemia Monitor replenish as appropriate  Carotid artery occlusion with infarction ([place]) Has occluded right common carotid artery with collaterals from external carotid artery. -Continue DAPT and statin. -On IV fluid to avoid hypotension -Vascular surgery on board.   DVT prophylaxis: Lovenox Code Status: FULL Family Communication: none at this time  Disposition Plan / TOC needs: home health PT/OT Barriers to discharge / significant pending items: pending clearance from vascular surgery, pt is in SDU post vascular procedure today              Objective: Vitals:   04/11/22 1530 04/11/22 1545 04/11/22 1600 04/11/22 1700  BP:    (!) 141/67  Pulse:    93  Resp: '14 14 16 14  '$ Temp:    97.9 F (36.6 C)  TempSrc:    Oral  SpO2:      Weight:      Height:        Intake/Output Summary (Last 24 hours) at 04/11/2022 1718 Last data filed at 04/11/2022 1315 Gross per 24 hour  Intake 1850 ml  Output 325 ml  Net 1525 ml   Filed Weights   04/03/22 1929  Weight: 63.5 kg    Examination:  Constitutional:  VS as above General Appearance: alert, well-developed, well-nourished, NAD Eyes: Normal lids and conjunctive, non-icteric sclera Ears, Nose, Mouth, Throat: Normal appearance Neck: No masses, trachea midline Respiratory: Normal respiratory effort Breath sounds normal, no wheeze/rhonchi/rales Cardiovascular: S1/S2 normal, no murmur/rub/gallop auscultated Pedal pulse II/IV bilaterally DP and  PT No lower extremity edema Gastrointestinal: Nontender, no masses Musculoskeletal:  No clubbing/cyanosis of digits Neurological: No cranial nerve deficit on limited exam Motor intact and symmetric Psychiatric: Normal judgment/insight Normal mood and affect       Scheduled Medications:   [START ON 04/12/2022] aspirin EC  81 mg Oral Q0600   atorvastatin  80 mg Oral QPM   busPIRone  5 mg Oral BID   Chlorhexidine Gluconate Cloth  6 each Topical Daily   clopidogrel  75 mg Oral Daily   [START ON 04/12/2022] docusate sodium  100 mg Oral Daily   [START ON 04/12/2022] enoxaparin (LOVENOX) injection  40 mg Subcutaneous Q24H   feeding supplement  1 Container Oral TID BM   insulin aspart  0-5 Units Subcutaneous QHS   insulin aspart  0-9 Units Subcutaneous TID WC   metoprolol tartrate  12.5 mg Oral BID   [START ON 04/12/2022] multivitamin with minerals  1 tablet Oral Daily   nicotine  21 mg Transdermal Daily   pantoprazole  40 mg Oral Daily   polyethylene glycol  17 g Oral Daily   sertraline  150 mg Oral Daily   sodium chloride flush        Continuous Infusions:  sodium chloride 100 mL/hr at 04/11/22 1450   sodium chloride      ceFAZolin (ANCEF) IV     DOPamine     famotidine (PEPCID) IV 20 mg (04/11/22 1657)   magnesium sulfate bolus IVPB  nitroGLYCERIN      PRN Medications:  sodium chloride, acetaminophen **OR** acetaminophen, albuterol, alum & mag hydroxide-simeth, Glycerin (Adult), guaiFENesin-dextromethorphan, hydrALAZINE, HYDROmorphone (DILAUDID) injection, HYDROmorphone (DILAUDID) injection, labetalol, magnesium sulfate bolus IVPB, metoprolol tartrate, morphine injection, ondansetron, oxyCODONE, oxyCODONE-acetaminophen, phenol, potassium chloride, senna-docusate, sorbitol, traMADol  Antimicrobials:  Anti-infectives (From admission, onward)    Start     Dose/Rate Route Frequency Ordered Stop   04/11/22 1800  ceFAZolin (ANCEF) IVPB 2g/100 mL premix        2 g 200 mL/hr  over 30 Minutes Intravenous Every 8 hours 04/11/22 1436 04/12/22 0959   04/11/22 0949  ceFAZolin (ANCEF) 2-4 GM/100ML-% IVPB       Note to Pharmacy: Rivka [place] L: cabinet override      04/11/22 0949 04/11/22 1022   04/11/22 0600  ceFAZolin (ANCEF) IVPB 2g/100 mL premix        2 g 200 mL/hr over 30 Minutes Intravenous On call to O.R. 04/11/22 0148 04/11/22 1022   04/05/22 1408  ceFAZolin (ANCEF) IVPB 2g/100 mL premix        2 g 200 mL/hr over 30 Minutes Intravenous 30 min pre-op 04/05/22 1408 04/05/22 1615       Data Reviewed: I have personally reviewed following labs and imaging studies  CBC: Recent Labs  Lab 04/05/22 0553 04/06/22 0612 04/07/22 0447 04/09/22 0424 04/11/22 0600  WBC 6.7 7.1 6.9 7.2 7.3  NEUTROABS  --   --   --   --  4.4  HGB 10.2* 10.8* 10.7* 11.9* 11.8*  HCT 31.6* 33.2* 32.3* 36.9 36.3  MCV 88.8 87.8 88.0 88.3 89.0  PLT 194 177 167 190 700   Basic Metabolic Panel: Recent Labs  Lab 04/05/22 0553 04/06/22 0612 04/07/22 0447 04/09/22 0424 04/11/22 0600  NA 139 136 136 139 135  K 4.6 4.1 4.4 4.5 4.3  CL 108 105 104 108 103  CO2 '26 26 26 23 26  '$ GLUCOSE 131* 122* 143* 140* 141*  BUN '12 9 14 12 14  '$ CREATININE 0.74 0.70 0.87 0.70 0.78  CALCIUM 8.8* 8.2* 8.6* 8.7* 8.9  MG 1.6* 1.7 1.8 1.7  --   PHOS 3.3 3.2 3.9 3.8  --    GFR: Estimated Creatinine Clearance: 55.2 mL/min (by C-G formula based on SCr of 0.78 mg/dL). Liver Function Tests: Recent Labs  Lab 04/05/22 0553 04/06/22 0612 04/07/22 0447 04/09/22 0424  ALBUMIN 3.1* 3.2* 3.1* 3.2*   No results for input(s): LIPASE, AMYLASE in the last 168 hours. No results for input(s): AMMONIA in the last 168 hours. Coagulation Profile: No results for input(s): INR, PROTIME in the last 168 hours. Cardiac Enzymes: No results for input(s): CKTOTAL, CKMB, CKMBINDEX, TROPONINI in the last 168 hours. BNP (last 3 results) No results for input(s): PROBNP in the last 8760 hours. HbA1C: No results for  input(s): HGBA1C in the last 72 hours. CBG: Recent Labs  Lab 04/10/22 2034 04/11/22 0733 04/11/22 1344 04/11/22 1411 04/11/22 1648  GLUCAP 187* 148* 201* 203* 194*   Lipid Profile: No results for input(s): CHOL, HDL, LDLCALC, TRIG, CHOLHDL, LDLDIRECT in the last 72 hours. Thyroid Function Tests: No results for input(s): TSH, T4TOTAL, FREET4, T3FREE, THYROIDAB in the last 72 hours. Anemia Panel: No results for input(s): VITAMINB12, FOLATE, FERRITIN, TIBC, IRON, RETICCTPCT in the last 72 hours. Urine analysis:    Component Value Date/Time   COLORURINE YELLOW (A) 06/22/2021 2052   APPEARANCEUR CLEAR (A) 06/22/2021 2052   LABSPEC 1.010 06/22/2021 2052   PHURINE 6.0 06/22/2021  2052   GLUCOSEU 50 (A) 06/22/2021 2052   HGBUR NEGATIVE 06/22/2021 2052   BILIRUBINUR NEGATIVE 06/22/2021 2052   KETONESUR 5 (A) 06/22/2021 2052   PROTEINUR NEGATIVE 06/22/2021 2052   NITRITE NEGATIVE 06/22/2021 2052   LEUKOCYTESUR NEGATIVE 06/22/2021 2052   Sepsis Labs: '@LABRCNTIP'$ (procalcitonin:4,lacticidven:4)  Recent Results (from the past 240 hour(s))  MRSA Next Gen by PCR, Nasal     Status: None   Collection Time: 04/11/22  2:37 PM   Specimen: Nasal Mucosa; Nasal Swab  Result Value Ref Range Status   MRSA by PCR Next Gen NOT DETECTED NOT DETECTED Final    Comment: (NOTE) The GeneXpert MRSA Assay (FDA approved for NASAL specimens only), is one component of a comprehensive MRSA colonization surveillance program. It is not intended to diagnose MRSA infection nor to guide or monitor treatment for MRSA infections. Test performance is not FDA approved in patients less than 60 years old. Performed at [facility], [address], [place], [place] [address]          Radiology Studies last 96 hours: DG C-Arm 1-60 Min-No Report  Result Date: 04/11/2022 Fluoroscopy was utilized by the requesting physician.  No radiographic interpretation.            LOS: 7 days    Time spent:  35 min    Emeterio Reeve, DO [facility] 04/11/2022, 5:18 PM   Staff may message me via secure chat in [place]  but this may not receive immediate response,  please page for urgent matters!  If 7PM-7AM, please contact night-coverage [email or web address]  Dictation software was used to generate the above note. Typos may occur and escape review, as with typed/written notes. Please contact Dr Sheppard Coil directly for clarity if needed.

## 2022-04-11 NOTE — Anesthesia Preprocedure Evaluation (Addendum)
Anesthesia Evaluation  Patient identified by MRN, date of birth, ID band Patient awake    Reviewed: Allergy & Precautions, NPO status , Patient's Chart, lab work & pertinent test results  History of Anesthesia Complications Negative for: history of anesthetic complications  Airway Mallampati: I   Neck ROM: Full    Dental  (+) Edentulous Upper, Edentulous Lower   Pulmonary asthma , COPD, Current Smoker (1 ppd) and Patient abstained from smoking.,    Pulmonary exam normal breath sounds clear to auscultation       Cardiovascular hypertension, + Peripheral Vascular Disease (on Plavix; carotid stenosis)  Normal cardiovascular exam Rhythm:Regular Rate:Normal  ECG 04/03/22:  Normal sinus rhythm Possible Left atrial enlargement  Myocardial perfusion 08/28/21:  Borderline myocardial perfusion scan there is borderline  apical anterior defect mild small with equivocal redistribution overall ejection fraction of wall motion is normal at about 60% conclusion borderline myocardial perfusion scan consider further work-up and evaluation invasively if clinically necessary.  Echo 08/28/21:  NORMAL LEFT VENTRICULAR SYSTOLIC FUNCTION  NORMAL RIGHT VENTRICULAR SYSTOLIC FUNCTION  NO VALVULAR STENOSIS  TRIVIAL MR, TR  MODERATE AR  EF 55-60%    Neuro/Psych CVA (acute this admission; speech returned to baseline)    GI/Hepatic GERD  ,  Endo/Other  diabetes, Type 2Obesity   Renal/GU negative Renal ROS     Musculoskeletal  (+) Arthritis ,   Abdominal   Peds  Hematology  (+) Blood dyscrasia, anemia ,   Anesthesia Other Findings   Reproductive/Obstetrics                            Anesthesia Physical Anesthesia Plan  ASA: 4  Anesthesia Plan: General   Post-op Pain Management:    Induction: Intravenous  PONV Risk Score and Plan: 2 and Ondansetron, Dexamethasone and Treatment may vary due to age or medical  condition  Airway Management Planned: Oral ETT  Additional Equipment: Arterial line  Intra-op Plan:   Post-operative Plan: Extubation in OR  Informed Consent: I have reviewed the patients History and Physical, chart, labs and discussed the procedure including the risks, benefits and alternatives for the proposed anesthesia with the patient or authorized representative who has indicated his/her understanding and acceptance.     Dental advisory given  Plan Discussed with: CRNA  Anesthesia Plan Comments: (Patient consented for risks of anesthesia including but not limited to:  - adverse reactions to medications - damage to eyes, teeth, lips or other oral mucosa - nerve damage due to positioning  - sore throat or hoarseness - damage to heart, brain, nerves, lungs, other parts of body or loss of life  Informed patient about role of CRNA in peri- and intra-operative care.  Patient voiced understanding.)        Anesthesia Quick Evaluation

## 2022-04-11 NOTE — Anesthesia Postprocedure Evaluation (Signed)
Anesthesia Post Note  Patient: VINEY ACOCELLA  Procedure(s) Performed: ENDARTERECTOMY FEMORAL (Left) INSERTION OF ILIAC STENT (Left) APPLICATION OF CELL SAVER  Patient location during evaluation: PACU Anesthesia Type: General Level of consciousness: awake and alert, oriented and patient cooperative Pain management: pain level controlled Vital Signs Assessment: post-procedure vital signs reviewed and stable Respiratory status: spontaneous breathing, nonlabored ventilation and respiratory function stable Cardiovascular status: blood pressure returned to baseline and stable Postop Assessment: adequate PO intake Anesthetic complications: no   No notable events documented.   Last Vitals:  Vitals:   04/11/22 1400 04/11/22 1430  BP: (!) 130/55 (!) 141/50  Pulse: 88   Resp: 16   Temp: (!) 36.1 C 36.7 C  SpO2: 94% 95%    Last Pain:  Vitals:   04/11/22 1430  TempSrc: Oral  PainSc:                  Darrin Nipper

## 2022-04-12 ENCOUNTER — Encounter: Payer: Self-pay | Admitting: Vascular Surgery

## 2022-04-12 LAB — GLUCOSE, CAPILLARY
Glucose-Capillary: 143 mg/dL — ABNORMAL HIGH (ref 70–99)
Glucose-Capillary: 271 mg/dL — ABNORMAL HIGH (ref 70–99)
Glucose-Capillary: 143 mg/dL — ABNORMAL HIGH (ref 70–99)
Glucose-Capillary: 199 mg/dL — ABNORMAL HIGH (ref 70–99)

## 2022-04-12 LAB — MAGNESIUM: Magnesium: 1.9 mg/dL (ref 1.7–2.4)

## 2022-04-12 LAB — PHOSPHORUS: Phosphorus: 3.3 mg/dL (ref 2.5–4.6)

## 2022-04-12 MED ORDER — AMITRIPTYLINE HCL 75 MG PO TABS
150.0000 mg | ORAL_TABLET | Freq: Every day | ORAL | Status: DC
  Administered 2022-04-12: 150 mg via ORAL
  Filled 2022-04-12: qty 2

## 2022-04-12 MED ORDER — FAMOTIDINE 20 MG PO TABS
20.0000 mg | ORAL_TABLET | Freq: Two times a day (BID) | ORAL | Status: DC
  Administered 2022-04-12 – 2022-04-13 (×3): 20 mg via ORAL
  Filled 2022-04-12 (×3): qty 1

## 2022-04-12 NOTE — TOC Initial Note (Signed)
Transition of Care [facility]) - Initial/Assessment Note    Patient Details  Name: Autumn Arnold MRN: [id number] Date of Birth: [birth year]  Transition of Care [facility]) CM/SW Contact:    Shelbie Hutching, RN Phone Number: 04/12/2022, 11:17 AM  Clinical Narrative:                 Met with patient at the bedside, introduced self and explained role in DC planning.  Patient reports that she is from home and lives with her husband.  She uses a rollator and she thinks she has a bedside commode at home.  She reports that her husband or her sister will be able to provide transportation home.  Patient has been open with [facility] in the past for [facility] services.  [place] with [facility] given referral for RN, PT, OT, and aide.    Expected Discharge Plan: [place] Barriers to Discharge: Continued Medical Work up   Patient Goals and CMS Choice Patient states their goals for this hospitalization and ongoing recovery are:: Patient would like to get back home CMS Medicare.gov Compare Post Acute Care list provided to:: Patient Choice offered to / list presented to : Patient, Spouse  Expected Discharge Plan and Services Expected Discharge Plan: [place]   Discharge Planning Services: CM Consult Post Acute Care Choice: [place] arrangements for the past 2 months: Single Family Home                 DME Arranged: N/A DME Agency: NA       HH Arranged: RN, PT, OT, Nurse's Aide HH Agency: [place] Date [place]: 04/12/22 Time [place]: 5 Representative spoke with at [place]: [place]  Prior Living Arrangements/Services Living arrangements for the past 2 months: [place] with:: Spouse Patient language and need for interpreter reviewed:: Yes Do you feel safe going back to the place where you live?: Yes      Need for Family Participation in Patient Care: Yes (Comment) Care giver support system in place?:  Yes (comment) Current home services: DME (rollator, 3 in 1) Criminal Activity/Legal Involvement Pertinent to Current Situation/Hospitalization: No - Comment as needed  Activities of Daily Living Home Assistive Devices/Equipment: Environmental consultant (specify type), Cane (specify quad or straight) ADL Screening (condition at time of admission) Patient's cognitive ability adequate to safely complete daily activities?: Yes Is the patient deaf or have difficulty hearing?: No Does the patient have difficulty seeing, even when wearing glasses/contacts?: No Does the patient have difficulty concentrating, remembering, or making decisions?: No Patient able to express need for assistance with ADLs?: Yes Does the patient have difficulty dressing or bathing?: No Independently performs ADLs?: Yes (appropriate for developmental age) Does the patient have difficulty walking or climbing stairs?: No Weakness of Legs: None Weakness of Arms/Hands: None  Permission Sought/Granted Permission sought to share information with : Case Manager, Family Supports Permission granted to share information with : Yes, Verbal Permission Granted     Permission granted to share info w AGENCY: [facility]        Emotional Assessment Appearance:: Appears older than stated age Attitude/Demeanor/Rapport: Engaged Affect (typically observed): Accepting Orientation: : Oriented to Self, Oriented to Place Alcohol / Substance Use: Not Applicable Psych Involvement: No (comment)  Admission diagnosis:  PAD (peripheral artery disease) (HCC) [I73.9] Acute CVA (cerebrovascular accident) ([place]) [I63.9] Cerebrovascular accident (CVA), unspecified mechanism ([place]) [I63.9] Patient Active Problem List   Diagnosis  Date Noted   Carotid artery occlusion with infarction ([place]) 04/06/2022   Hypomagnesemia 04/05/2022   Iron deficiency anemia 04/04/2022   Acute CVA (cerebrovascular accident) ([place]) 04/04/2022   COPD (chronic obstructive pulmonary  disease) ([place])    Hypotension    Gastroesophageal reflux disease without esophagitis 08/10/2021   Carotid stenosis 07/29/2021   Atherosclerosis of native arteries of extremity with intermittent claudication ([place]) 07/17/2021   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[place]) 07/17/2021   Hyperlipidemia 07/17/2021   Atherosclerosis of arteries 07/07/2021   Acute blood loss anemia    Retroperitoneal bleed    Drop in hemoglobin    Visual disturbance    Sepsis ([place]) 06/23/2021   SIRS (systemic inflammatory response syndrome) (HCC)    Rectal bleeding    Tobacco abuse    Anxiety and depression 06/22/2021   Hemiparesis affecting right side as late effect of stroke ([place]) 06/22/2021   OSA (obstructive sleep apnea) 06/22/2021   Type 2 diabetes mellitus with hyperlipidemia ([place]) 06/22/2021   Syncope 06/22/2021   Critical limb ischemia of left lower extremity ([place]) 06/22/2021   Hyperglycemia due to type 2 diabetes mellitus ([place]) 06/22/2021   Lactic acidosis 06/22/2021   Vitamin D deficiency 07/01/2018   Osteopenia of left hip 06/27/2018   DNR (do not resuscitate) 06/23/2018   Encounter for general adult medical examination without abnormal findings 06/23/2018   Other chronic pain 06/23/2018   Tobacco dependence 10/18/2017   PCP:  [name], MD Pharmacy:   [id number] [place] [facility] [address]Phone: [phone]: [phone] [id number]: [phone]: [phone]   Social Determinants of Health (SDOH) Interventions    Readmission Risk Interventions    06/23/2021    2:20 PM  Readmission Risk Prevention Plan  Post Dischage Appt Complete  Medication Screening Complete  Transportation Screening Complete

## 2022-04-12 NOTE — Progress Notes (Signed)
[facility]  Daily Progress Note   Subjective  -   Patient experiencing some numbness in her inner thigh as well as discomfort in her left foot post endarterectomy otherwise stable.  Objective Vitals:   04/12/22 1200 04/12/22 1229 04/12/22 1833 04/12/22 2000  BP:  (!) 155/44 (!) 188/77 (!) 168/60  Pulse:  (!) 104  (!) 113  Resp:  17 19 (!) 31  Temp: 97.8 F (36.6 C)   98.2 F (36.8 C)  TempSrc: Oral   Oral  SpO2:    95%  Weight:      Height:        Intake/Output Summary (Last 24 hours) at 04/12/2022 2117 Last data filed at 04/12/2022 1606 Gross per 24 hour  Intake 2366.6 ml  Output 3450 ml  Net -1083.4 ml    PULM  CTAB CV  RRR VASC  left foot warm with dopplerable pulses.  No hematoma left groin Prevena wound VAC in place  Laboratory CBC    Component Value Date/Time   WBC 11.4 (H) 04/11/2022 2332   HGB 9.8 (L) 04/11/2022 2332   HCT 30.2 (L) 04/11/2022 2332   PLT 189 04/11/2022 2332    BMET    Component Value Date/Time   NA 136 04/11/2022 2332   K 4.0 04/11/2022 2332   CL 109 04/11/2022 2332   CO2 22 04/11/2022 2332   GLUCOSE 264 (H) 04/11/2022 2332   BUN 11 04/11/2022 2332   CREATININE 0.76 04/11/2022 2332   CALCIUM 8.3 (L) 04/11/2022 2332   GFRNONAA >60 04/11/2022 2332   GFRAA >60 11/08/2015 0419    Assessment/Planning: POD #1 s/p left femoral endarterectomy  The numbness the patient has in her thigh is consistent with typical changes seen post femoral endarterectomy given the incision causing some dissection of the nerves.  Advised patient that this can take months to regain full sensation. The patient's lower extremity numbness is likely reperfusion injury, as a result of several weeks of lower extremity ischemia.  The patient's foot is warm with dopplerable pulses, currently no concern for recurrent ischemia. The patient has not noted significant carotid stenosis that also needs intervention.  We typically plan on delaying this  intervention for 1 to 2 weeks postacute stroke.  Patient will need to follow-up in our office in 1 week to discuss surgical intervention as well as for wound evaluation.  Kris Hartmann  04/12/2022, 9:17 PM

## 2022-04-12 NOTE — Progress Notes (Signed)
PT re-evaluation performed on 04/12/22.  Pt is a pleasant 71 year old female who was agreeable to PT for re-evaluation POD 1 from endarterectomy. Pt performs bed mobility, transfers, and ambulation with CGA to Min A. At start of session, pt transferred from bed to [facility] to void with CGA and line management. Pt then ambulated around bed in room to chair with CGA, but ambulation was limited due to pt pain level (8/10 at start of session and 10/10 once seated in chair) in the L groin/abdominal region. Noted pt flexed trunk during ambulation while guarding abdominal region. Pt demonstrates deficits with pain, strength, endurance and activity tolerance. Would benefit from skilled PT to address above deficits and promote optimal return to PLOF. Currently recommend [facility] PT once pain level is controlled, as pt has good rehab potential based on PLOF and recent mobility status.    04/12/22 1300  PT Visit Information  Last PT Received On 04/12/22  Assistance Needed +1  History of Present Illness 71 y.o. 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 She also had numbness and weakness of the left arm - imaging revealed small acute/sub-acute CVA. Pt underwent endarterectomy through L femoral artery on 04/11/22 requiring an ICU stay.  Precautions  Precautions Fall  Restrictions  Weight Bearing Restrictions No  Home Living  Family/patient expects to be discharged to: Private residence  Living Arrangements Spouse/significant other  Available Help at Discharge Family;Friend(s);Available PRN/intermittently  Type of  Home House  Home Access Stairs to enter  Entrance Stairs-Number of Steps 2  Entrance Stairs-Rails None (column)  Home Layout One level  Home Equipment Rollator (4 wheels);Rolling Walker (2 wheels);Cane - single point  Prior Function  Prior Level of Function  Independent/Modified Independent  Mobility Comments Pt reports amb with no AD, does not drive often  ADLs Comments pt reports MOD I with ADL/ IADL however reports husband has been driving to grocery store, doing grocery shopping  Communication  Communication No difficulties  Pain Assessment  Pain Assessment 0-10  Pain Score 8 (8/10 at start of session (10/10 at end of session))  Pain Location L foot, L groin/abdominal region  Pain Descriptors / Indicators Operative site guarding;Other (Comment);Guarding (pt holding abdomen; unable to stand up fully)  Pain Intervention(s) Limited activity within patient's tolerance;Monitored during session;Repositioned  Cognition  Arousal/Alertness Awake/alert  Behavior During Therapy [facility] for tasks assessed/performed  Overall Cognitive Status Within Functional Limits for tasks assessed  General Comments A&Ox4, able to follow instructions and answer all questions appropriately  Bed Mobility  Overal bed mobility Needs Assistance  Bed Mobility Supine to Sit  Supine to sit Min assist  General bed mobility comments Increased time. Min A needed for truncal support due to pain in abdomen  Transfers  Overall transfer level Needs assistance  Equipment used Rolling walker (2 wheels)  Transfers Sit to/from Stand  Sit to Stand Min guard  Bed to/from chair/wheelchair/BSC transfer type: Step pivot  Step pivot transfers Min guard  General transfer comment Pt transferred from bed to [facility] to void. CGA for sit-to-stand and step pivot with assistance needed for  line management. Vcs for hand placement when sitting at [facility].  Ambulation/Gait  Ambulation/Gait assistance Min guard  Gait Distance (Feet) 10 Feet   Assistive device Rolling walker (2 wheels)  Gait Pattern/deviations Antalgic;Step-through pattern;Trunk flexed;Decreased step length - left;Decreased step length - right;Decreased stride length (pt holding stomach, trunk flexed during gait due to pain)  General Gait Details Pain limited with ambulation. Pt unable to stand upright, trunk flexed during gait due to pain.  Balance  Overall balance assessment Needs assistance  Sitting-balance support Feet supported;No upper extremity supported  Sitting balance-Leahy Scale Good  Sitting balance - Comments Pt able to reach for RW while in sitting EOB.  Standing balance support Reliant on assistive device for balance;Single extremity supported  Standing balance-Leahy Scale Fair  Standing balance comment Pt able to stand with 1 hand on RW and 1 hand guarding abdomen due to pain. No LOB.  Exercises  Exercises Other exercises  Other Exercises  Other Exercises SLR x4 ea while lying supine. Pt able to hold R LE in SLR while grippy sock is donned.  PT - End of Session  Equipment Utilized During Treatment Gait belt  Activity Tolerance Patient limited by pain  Patient left Other (comment);in chair;with family/visitor present (transfer care to OT at end of session)  Nurse Communication Mobility status  PT Assessment  PT Recommendation/Assessment Patient needs continued PT services  PT Visit Diagnosis Muscle weakness (generalized) (M62.81);Difficulty in walking, not elsewhere classified (R26.2);Unsteadiness on feet (R26.81);Other symptoms and signs involving the nervous system (R29.898)  PT Problem List Decreased strength;Decreased range of motion;Decreased activity tolerance;Decreased balance;Decreased mobility;Decreased coordination;Decreased safety awareness;Decreased knowledge of use of DME;Decreased knowledge of precautions  PT Plan  PT Frequency (ACUTE ONLY) Min 2X/week  PT Treatment/Interventions (ACUTE ONLY) DME instruction;Gait training;Stair  training;Functional mobility training;Therapeutic activities;Therapeutic exercise;Balance training;Neuromuscular re-education;Patient/family education  AM-PAC PT "6 Clicks" Mobility Outcome Measure (Version 2)  Help needed turning from your back to your side while in a flat bed without using bedrails? 4  Help needed moving from lying on your back to sitting on the side of a flat bed without using bedrails? 3  Help needed moving to and from a bed to a chair (including a wheelchair)? 3  Help needed standing up from a chair using your arms (e.g., wheelchair or bedside chair)? 3  Help needed to walk in hospital room? 3  Help needed climbing 3-5 steps with a railing?  2  6 Click Score 18  Consider Recommendation of Discharge To: Home with [facility]  Progressive Mobility  What is the highest level of mobility based on the progressive mobility assessment? Level 5 (Walks with assist in room/hall) - Balance while stepping forward/back and can walk in room with assist - Complete  Activity Ambulated with assistance in room  PT Recommendation  Follow Up Recommendations Home health PT  Assistance recommended at discharge Intermittent Supervision/Assistance  Patient can return home with the following A little help with walking and/or transfers;Help with stairs or ramp for entrance;Assistance with cooking/housework;Assist for transportation  Functional Status Assessment Patient has had a recent decline in their functional status and demonstrates the ability to make significant improvements in function in a reasonable and predictable amount of time.  PT equipment Rolling walker (2 wheels) (youth-sized)  Individuals Consulted  Consulted and Agree with Results and Recommendations Patient  Acute Rehab PT Goals  Patient Stated Goal To go home.  PT Goal Formulation With patient  Time For Goal Achievement 04/18/22  Potential to Achieve Goals Good  PT Time Calculation  PT Start Time (ACUTE ONLY) 1315  PT Stop Time  (ACUTE ONLY) 1337  PT Time Calculation (min) (ACUTE ONLY) 22 min  PT General Charges  $$ ACUTE PT VISIT 1 Visit  PT Evaluation  $PT Re-evaluation 1 Re-eval  PT Treatments  $Therapeutic Activity 8-22 mins  Written Expression  Dominant Hand Right

## 2022-04-12 NOTE — Discharge Summary (Addendum)
PROGRESS NOTE  Epic will not permit addendum but  this is PROGRESS NOTE  *NOT* DISCHARGE SUMMARYMAHDIYA Arnold  ZOX:[id number] DOB: 1951/04/19  DOA: 04/04/2022 Date of Service: 04/12/22 PCP: Autumn Body, MD    Brief Narrative / Hospital Course:  71 year old F with PMH of CVA, OSA, DM-2, HTN, PVD s/p aortoiliac and SMA stent on DAPT and mild cognitive deficits, anxiety and tobacco use disorder presenting with altered mental status, blurry vision, aphasia and left arm numbness and weakness, and admitted 05/31/230for CVA evaluation.  She was hypotensive to 70s/40s.  MRI brain concerning for right frontoparietal acute to early subacute ischemic CVA.  Reportedly, her symptoms resolved in ED except for some residual left arm weakness and numbness.  There was also concern about critical left limb ischemia.  Neurology and vascular surgery consulted.  The next day, CTA head and neck showed unchanged occlusion of right common carotid and plaque at the proximal right and left ICA causing less than 50% stenosis.  No new occlusion or hemodynamically significant stenosis noted.  TTE, carotid US and lower extremity angiography ordered. TTE with without significant finding.  Angiograph showed occluded right CCA filling through collaterals, 80 to 90% stenosis of the right subclavian artery, occluded left common iliac artery, patent right common iliac artery stent.  Cardiology cleared patient for revascularization.  Vascular surgery performed endarterectomy performed 04/11/22. Did well overnight. Medically stable for transfer to floor 04/12/22 and will keep another night per vascular recommendations as standard of practice for femoral endarterectomy   Consultants:  Neurology Vascular surgery Cardiology    Procedures: 04/05/22: angiography  04/11/22: Left common femoral, profunda femoris, and superficial femoral artery endarterectomies and patch angioplasty, Stent placement to bilateral common iliac  arteries and left external iliac artery, balloon angioplasty of the infrarenal abdominal aorta     Subjective: Patient reports feeling well this morning, no concerns. She is eating breakfast and pain is controlled.      ASSESSMENT & PLAN:   Principal Problem:   Acute CVA (cerebrovascular accident) ([place]) Active Problems:   Critical limb ischemia of left lower extremity (HCC)   Hypotension   Carotid artery occlusion with infarction (HCC)   Type 2 diabetes mellitus with hyperlipidemia (HCC)   Iron deficiency anemia   Anxiety and depression   OSA (obstructive sleep apnea)   Tobacco dependence   COPD (chronic obstructive pulmonary disease) (HCC)   Hypomagnesemia   Acute CVA (cerebrovascular accident) ([place]) Carotid artery occlusion with infarction ([place]) History of CVA (cerebrovascular accident) Hx TIA MRI concerning for acute to early subacute right frontoparietal CVA.  She seems to have residual left lower extremity weakness which improved over hospitalization.  Per family, speech is back to baseline.  CTA head and neck with chronic right CCA occlusion with collaterals from external carotid artery.TTE without significant finding.  LDL 64.  A1c 7.6%.   -SBP goal 120s to 140s.  Out of permissive HTN window. -Continue statin, Plavix and aspirin. -Encourage tobacco cessation. -Continue therapy  COPD (chronic obstructive pulmonary disease) (HCC) Tobacco dependence Stable.  Over 60-pack-year history.  Currently smokes about a pack a day.  Discussed the importance of smoking cessation.  -Nicotine patch as needed  Critical limb ischemia of left lower extremity (HCC) PVD s/p aorto iliac and SMA stent 06/2021.  She has some left foot erythema with no palpable DP or PT pulse on my exam. Angiograph showed occluded right CCA filling through collaterals, 80 to 90% stenosis of the right subclavian artery,  occluded left common iliac artery, patent right common iliac artery stent. -Continue  Plavix, aspirin and atorvastatin. -Pain control with as needed Tylenol, tramadol and oxycodone -Encouraged smoking cessation -stenting and angioplasty 04/11/22, follow as directed w/ vascular   Hypotension BP lowest was 70/48.  Resolved. -Continue reduced dose of metoprolol -have d/c IV fluid, pt tolerating po and BP have improved   Type 2 diabetes mellitus with hyperlipidemia (HCC) A1c 7.6%. Continue SSI and statin  Iron deficiency anemia H&H relatively stable.  No report of melena or hematochezia.   Anemia panel suggests iron deficiency. -IV ferric gluconate 250 mg x 1 -Monitor H&H -slightly decreased H/H likely d/t surgery, no bleeding, follow outpatient  Anxiety and depression Stable.  Continue buspirone, sertraline and amitriptyline  OSA (obstructive sleep apnea) Continue oxygen at night  Hypomagnesemia Monitor replenish as appropriate    DVT prophylaxis: Lovenox (pt declined this AM) Code Status: FULL Family Communication: none at this time  Disposition Plan / TOC needs: transfer to med/surg floor today w/ telemetry. Planning for home health PT/OT Barriers to discharge / significant pending items: pending clearance from vascular surgery, probably home tomorrow if remains stable       Objective: Vitals:   04/11/22 2242 04/12/22 0000 04/12/22 0400 04/12/22 0800  BP: (!) 161/58 (!) 139/44 (!) 144/50 (!) 132/44  Pulse: 97 95 84   Resp:  18 16   Temp:  98 F (36.7 C) 98.2 F (36.8 C) 98.2 F (36.8 C)  TempSrc:  Oral Oral Oral  SpO2:  96% 96% 96%  Weight:      Height:        Intake/Output Summary (Last 24 hours) at 04/12/2022 3903 Last data filed at 04/12/2022 0808 Gross per 24 hour  Intake 4757.79 ml  Output 3675 ml  Net 1082.79 ml   Filed Weights   04/03/22 1929  Weight: 63.5 kg    Examination:  Constitutional:  VS as above General Appearance: alert, well-developed, well-nourished, NAD Eyes: Normal lids and conjunctive, non-icteric  sclera Ears, Nose, Mouth, Throat: Normal appearance Neck: No masses, trachea midline Respiratory: Normal respiratory effort Breath sounds normal, no wheeze/rhonchi/rales Cardiovascular: S1/S2 norma, RRR Pedal pulse II/IV bilaterally DP and PT No lower extremity edema Gastrointestinal: Nontender, no masses Musculoskeletal:  No clubbing/cyanosis of digits Neurological: No cranial nerve deficit on limited exam Psychiatric: Normal judgment/insight Normal mood and affect       Scheduled Medications:   aspirin EC  81 mg Oral Q0600   atorvastatin  80 mg Oral QPM   busPIRone  5 mg Oral BID   Chlorhexidine Gluconate Cloth  6 each Topical Daily   clopidogrel  75 mg Oral Daily   docusate sodium  100 mg Oral Daily   enoxaparin (LOVENOX) injection  40 mg Subcutaneous Q24H   famotidine  20 mg Oral BID   feeding supplement  1 Container Oral TID BM   insulin aspart  0-5 Units Subcutaneous QHS   insulin aspart  0-9 Units Subcutaneous TID WC   metoprolol tartrate  12.5 mg Oral BID   multivitamin with minerals  1 tablet Oral Daily   nicotine  21 mg Transdermal Daily   pantoprazole  40 mg Oral Daily   polyethylene glycol  17 g Oral Daily   sertraline  150 mg Oral Daily    Continuous Infusions:  sodium chloride 100 mL/hr at 04/12/22 0808   sodium chloride     DOPamine     magnesium sulfate bolus IVPB  nitroGLYCERIN      PRN Medications:  sodium chloride, acetaminophen **OR** acetaminophen, albuterol, alum & mag hydroxide-simeth, Glycerin (Adult), guaiFENesin-dextromethorphan, hydrALAZINE, HYDROmorphone (DILAUDID) injection, labetalol, magnesium sulfate bolus IVPB, metoprolol tartrate, morphine injection, ondansetron, oxyCODONE, oxyCODONE-acetaminophen, phenol, potassium chloride, senna-docusate, sorbitol, traMADol  Antimicrobials:  Anti-infectives (From admission, onward)    Start     Dose/Rate Route Frequency Ordered Stop   04/11/22 1800  ceFAZolin (ANCEF) IVPB 2g/100 mL  premix        2 g 200 mL/hr over 30 Minutes Intravenous Every 8 hours 04/11/22 1436 04/12/22 0417   04/11/22 0949  ceFAZolin (ANCEF) 2-4 GM/100ML-% IVPB       Note to Pharmacy: Rivka Spring L: cabinet override      04/11/22 0949 04/11/22 1022   04/11/22 0600  ceFAZolin (ANCEF) IVPB 2g/100 mL premix        2 g 200 mL/hr over 30 Minutes Intravenous On call to O.R. 04/11/22 0148 04/11/22 1022   04/05/22 1408  ceFAZolin (ANCEF) IVPB 2g/100 mL premix        2 g 200 mL/hr over 30 Minutes Intravenous 30 min pre-op 04/05/22 1408 04/05/22 1615       Data Reviewed: I have personally reviewed following labs and imaging studies  CBC: Recent Labs  Lab 04/06/22 0612 04/07/22 0447 04/09/22 0424 04/11/22 0600 04/11/22 2332  WBC 7.1 6.9 7.2 7.3 11.4*  NEUTROABS  --   --   --  4.4  --   HGB 10.8* 10.7* 11.9* 11.8* 9.8*  HCT 33.2* 32.3* 36.9 36.3 30.2*  MCV 87.8 88.0 88.3 89.0 89.6  PLT 177 167 190 206 016   Basic Metabolic Panel: Recent Labs  Lab 04/06/22 0612 04/07/22 0447 04/09/22 0424 04/11/22 0600 04/11/22 2332 04/12/22 0318  NA 136 136 139 135 136  --   K 4.1 4.4 4.5 4.3 4.0  --   CL 105 104 108 103 109  --   CO2 '26 26 23 26 22  '$ --   GLUCOSE 122* 143* 140* 141* 264*  --   BUN '9 14 12 14 11  '$ --   CREATININE 0.70 0.87 0.70 0.78 0.76  --   CALCIUM 8.2* 8.6* 8.7* 8.9 8.3*  --   MG 1.7 1.8 1.7  --   --  1.9  PHOS 3.2 3.9 3.8  --   --  3.3   GFR: Estimated Creatinine Clearance: 55.2 mL/min (by C-G formula based on SCr of 0.76 mg/dL). Liver Function Tests: Recent Labs  Lab 04/06/22 0612 04/07/22 0447 04/09/22 0424  ALBUMIN 3.2* 3.1* 3.2*   No results for input(s): "LIPASE", "AMYLASE" in the last 168 hours. No results for input(s): "AMMONIA" in the last 168 hours. Coagulation Profile: No results for input(s): "INR", "PROTIME" in the last 168 hours. Cardiac Enzymes: No results for input(s): "CKTOTAL", "CKMB", "CKMBINDEX", "TROPONINI" in the last 168 hours. BNP (last  3 results) No results for input(s): "PROBNP" in the last 8760 hours. HbA1C: No results for input(s): "HGBA1C" in the last 72 hours. CBG: Recent Labs  Lab 04/11/22 1344 04/11/22 1411 04/11/22 1648 04/11/22 2229 04/12/22 0744  GLUCAP 201* 203* 194* 177* 143*   Lipid Profile: No results for input(s): "CHOL", "HDL", "LDLCALC", "TRIG", "CHOLHDL", "LDLDIRECT" in the last 72 hours. Thyroid Function Tests: No results for input(s): "TSH", "T4TOTAL", "FREET4", "T3FREE", "THYROIDAB" in the last 72 hours. Anemia Panel: No results for input(s): "VITAMINB12", "FOLATE", "FERRITIN", "TIBC", "IRON", "RETICCTPCT" in the last 72 hours. Urine analysis:    Component Value  Date/Time   COLORURINE YELLOW (A) 06/22/2021 2052   APPEARANCEUR CLEAR (A) 06/22/2021 2052   LABSPEC 1.010 06/22/2021 2052   PHURINE 6.0 06/22/2021 2052   GLUCOSEU 50 (A) 06/22/2021 2052   HGBUR NEGATIVE 06/22/2021 2052   BILIRUBINUR NEGATIVE 06/22/2021 2052   KETONESUR 5 (A) 06/22/2021 2052   PROTEINUR NEGATIVE 06/22/2021 2052   NITRITE NEGATIVE 06/22/2021 2052   LEUKOCYTESUR NEGATIVE 06/22/2021 2052   Sepsis Labs: '@LABRCNTIP'$ (procalcitonin:4,lacticidven:4)  Recent Results (from the past 240 hour(s))  MRSA Next Gen by PCR, Nasal     Status: None   Collection Time: 04/11/22  2:37 PM   Specimen: Nasal Mucosa; Nasal Swab  Result Value Ref Range Status   MRSA by PCR Next Gen NOT DETECTED NOT DETECTED Final    Comment: (NOTE) The GeneXpert MRSA Assay (FDA approved for NASAL specimens only), is one component of a comprehensive MRSA colonization surveillance program. It is not intended to diagnose MRSA infection nor to guide or monitor treatment for MRSA infections. Test performance is not FDA approved in patients less than 52 years old. Performed at [facility], [address] [place], [place] [address]          Radiology Studies last 96 hours: DG C-Arm 1-60 Min-No Report  Result Date:  04/11/2022 Fluoroscopy was utilized by the requesting physician.  No radiographic interpretation.            LOS: 8 days    Time spent: 35 min    Emeterio Reeve, DO [facility] 04/12/2022, 9:52 AM   Staff may message me via secure chat in [place]  but this may not receive immediate response,  please page for urgent matters!  If 7PM-7AM, please contact night-coverage [email or web address]  Dictation software was used to generate the above note. Typos may occur and escape review, as with typed/written notes. Please contact Dr Sheppard Coil directly for clarity if needed.

## 2022-04-12 NOTE — Evaluation (Signed)
Occupational Therapy Re-Evaluation Patient Details Name: Autumn Arnold MRN: [id number] DOB: [birth year] Today's Date: 04/12/2022   History of Present Illness 71 y.o. 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 She also had numbness and weakness of the left arm - imaging revealed small acute/sub-acute CVA. Pt underwent endarterectomy through L femoral artery on 04/11/22 requiring an ICU stay.   Clinical Impression   Autumn Arnold was seen for OT re-evaluation on this date following endarterectomy. Upon arrival to room pt seated in chair, agreeable to tx. Pt requires MOD A don/doff B socks in sitting - limited by pain at operative site. CGA for simulated standing grooming tasks reaching outside BOS. Completed seated red theraputty with min cues - updated HEP. Goals remain appropriate. Will continue to follow POC. Discharge recommendation remains appropriate.     Recommendations for follow up therapy are one component of a multi-disciplinary discharge planning process, led by the attending physician.  Recommendations may be updated based on patient status, additional functional criteria and insurance authorization.   Follow Up Recommendations  Home health OT    Assistance Recommended at Discharge Frequent or constant Supervision/Assistance  Patient can return home with the following A little help with walking and/or transfers;A little help with bathing/dressing/bathroom;Direct supervision/assist for medications management;Direct supervision/assist for financial management;Assistance with  cooking/housework    Functional Status Assessment     Equipment Recommendations  BSC/3in1;Tub/shower seat    Recommendations for Other Services       Precautions / Restrictions Precautions Precautions: Fall Restrictions Weight Bearing Restrictions: No      Mobility Bed Mobility               General bed mobility comments: received and left in sitting    Transfers Overall transfer level: Needs assistance Equipment used: None Transfers: Sit to/from Stand Sit to Stand: Min guard                  Balance Overall balance assessment: Needs assistance Sitting-balance support: Feet supported, No upper extremity supported Sitting balance-Leahy Scale: Good     Standing balance support: No upper extremity supported, During functional activity Standing balance-Leahy Scale: Fair Standing balance comment: reaching above head and outside BOS                           ADL either performed or assessed with clinical judgement   ADL Overall ADL's : Needs assistance/impaired                                       General ADL Comments: MOD A don/doff B socks in sitting - limited by pain at operative site. CGA for simulated standing grooming tasks reaching outside BOS      Pertinent Vitals/Pain Pain Assessment Pain Assessment: 0-10 Pain Score: 8  Pain Location: L foot, L groin/abdominal region Pain Descriptors / Indicators: Discomfort, Dull, Grimacing Pain Intervention(s): Limited activity within patient's tolerance, RN gave pain meds during session, Patient requesting pain meds-RN  notified     Hand Dominance Right   Extremity/Trunk Assessment Upper Extremity Assessment Upper Extremity Assessment: Overall WFL for tasks assessed   Lower Extremity Assessment Lower Extremity Assessment: Overall WFL for tasks assessed       Communication Communication Communication: No difficulties   Cognition Arousal/Alertness:  Awake/alert Behavior During Therapy: WFL for tasks assessed/performed Overall Cognitive Status: Within Functional Limits for tasks assessed                                 General Comments: reports family will assist with medication and IADLs     General Comments       Exercises Other Exercises Other Exercises: red theraputty exercises pinch, snake, ball,   Shoulder Instructions      Home Living Family/patient expects to be discharged to:: Private residence Living Arrangements: Spouse/significant other Available Help at Discharge: Family;Friend(s);Available PRN/intermittently Type of Home: House Home Access: Stairs to enter Entrance Stairs-Number of Steps: 2 Entrance Stairs-Rails: None (column) Home Layout: One level               Home Equipment: Rollator (4 wheels);Rolling Walker (2 wheels);Cane - single point          Prior Functioning/Environment Prior Level of Function : Independent/Modified Independent             Mobility Comments: Pt reports amb with no AD, does not drive often ADLs Comments: pt reports MOD I with ADL/ IADL however reports husband has been driving to grocery store, doing grocery shopping                 OT Goals(Current goals can be found in the care plan section) Acute Rehab OT Goals Patient Stated Goal: to go home OT Goal Formulation: With patient/family Time For Goal Achievement: 04/26/22 Potential to Achieve Goals: Good ADL Goals Pt Will Perform Lower Body Dressing: with modified independence;sit to/from stand Pt Will Transfer to Toilet: with modified independence Pt Will Perform Toileting - Clothing Manipulation and hygiene: with modified independence;sit to/from stand Pt/caregiver will Perform Home Exercise Program: Left upper extremity;Increased strength;With written HEP provided  OT Frequency: Min 4X/week    Co-evaluation              AM-PAC OT "6 Clicks" Daily Activity     Outcome Measure Help  from another person eating meals?: None Help from another person taking care of personal grooming?: A Little Help from another person toileting, which includes using toliet, bedpan, or urinal?: A Little Help from another person bathing (including washing, rinsing, drying)?: A Little Help from another person to put on and taking off regular upper body clothing?: A Lot Help from another person to put on and taking off regular lower body clothing?: A Little 6 Click Score: 18   End of Session Nurse Communication: Patient requests pain meds  Activity Tolerance: Patient tolerated treatment well Patient left: in chair;with call bell/phone within reach;with family/visitor present  OT Visit Diagnosis: Unsteadiness on feet (R26.81);Repeated falls (R29.6);History of falling (Z91.81);Other symptoms and signs involving the nervous system (R29.898)                Time: 6378-5885 OT Time Calculation (min): 18 min Charges:  OT General Charges $OT Visit: 1 Visit OT Evaluation $OT Re-eval: 1 Re-eval OT Treatments $Therapeutic Exercise: 8-22 mins  Dessie Coma, M.S. OTR/L  04/12/22, 2:54 PM  ascom [phone]

## 2022-04-12 NOTE — Consult Note (Signed)
   [facility] CM Inpatient Consult   04/12/2022  NACHELLE NEGRETTE 1951-02-17 [id number]  Remote review coverage for [facility] Liaison, patient at [facility] for high risk, [place] Patient: [employer]  Primary Care Provider:  [name], MD, [facility]   Patient screened for hospitalization with noted high risk score for unplanned readmission risk and to assess for length of stay for potential [place] Management service needs for post hospital transition.  Review of patient's medical record reveals patient is for home with [facility] per inpatient TOC review.  Plan: Current plan for HH .  , no other needs assessed at this time.  For questions contact:   Natividad Brood, RN BSN [facility] Liaison  [phone] business mobile phone Toll free office [phone]  Fax number: [phone] [email or web address]

## 2022-04-13 DIAGNOSIS — Z48812 Encounter for surgical aftercare following surgery on the circulatory system: Secondary | ICD-10-CM

## 2022-04-13 LAB — CBC
HCT: 30.9 % — ABNORMAL LOW (ref 36.0–46.0)
Hemoglobin: 10 g/dL — ABNORMAL LOW (ref 12.0–15.0)
MCH: 28.9 pg (ref 26.0–34.0)
MCHC: 32.4 g/dL (ref 30.0–36.0)
MCV: 89.3 fL (ref 80.0–100.0)
Platelets: 196 10*3/uL (ref 150–400)
RBC: 3.46 MIL/uL — ABNORMAL LOW (ref 3.87–5.11)
RDW: 14.8 % (ref 11.5–15.5)
WBC: 9.6 10*3/uL (ref 4.0–10.5)
nRBC: 0 % (ref 0.0–0.2)

## 2022-04-13 LAB — BASIC METABOLIC PANEL
Anion gap: 4 — ABNORMAL LOW (ref 5–15)
BUN: 8 mg/dL (ref 8–23)
CO2: 26 mmol/L (ref 22–32)
Calcium: 8.8 mg/dL — ABNORMAL LOW (ref 8.9–10.3)
Chloride: 108 mmol/L (ref 98–111)
Creatinine, Ser: 0.65 mg/dL (ref 0.44–1.00)
GFR, Estimated: >60 mL/min (ref >60)
Glucose, Bld: 172 mg/dL — ABNORMAL HIGH (ref 70–99)
Potassium: 3.7 mmol/L (ref 3.5–5.1)
Sodium: 138 mmol/L (ref 135–145)

## 2022-04-13 LAB — MAGNESIUM: Magnesium: 1.8 mg/dL (ref 1.7–2.4)

## 2022-04-13 LAB — GLUCOSE, CAPILLARY: Glucose-Capillary: 168 mg/dL — ABNORMAL HIGH (ref 70–99)

## 2022-04-13 LAB — SURGICAL PATHOLOGY

## 2022-04-13 LAB — PHOSPHORUS: Phosphorus: 3.6 mg/dL (ref 2.5–4.6)

## 2022-04-13 MED ORDER — METOPROLOL TARTRATE 25 MG PO TABS: 12.5000 mg | ORAL_TABLET | Freq: Two times a day (BID) | ORAL | 0 refills | Status: DC

## 2022-04-13 MED ORDER — OXYCODONE-ACETAMINOPHEN 5-325 MG PO TABS: 1.0000 | ORAL_TABLET | ORAL | 0 refills | Status: DC | PRN

## 2022-04-13 MED ORDER — SENNOSIDES-DOCUSATE SODIUM 8.6-50 MG PO TABS: 2.0000 | ORAL_TABLET | Freq: Every evening | ORAL | Status: DC | PRN

## 2022-04-13 NOTE — Progress Notes (Signed)
[facility]  Daily Progress Note   Subjective  -   Doing well.  Appropriate postoperative pain.  Foot is warm and well-perfused  Objective Vitals:   04/13/22 0600 04/13/22 0800 04/13/22 0856 04/13/22 0900  BP: (!) 127/40 (!) 137/46 (!) 137/46 (!) 112/53  Pulse: 88 98 (!) 102 100  Resp: (!) 29 (!) 29  (!) 35  Temp: 98 F (36.7 C) 97.6 F (36.4 C)    TempSrc: Oral Oral    SpO2: 92% 97%    Weight:      Height:        Intake/Output Summary (Last 24 hours) at 04/13/2022 1222 Last data filed at 04/13/2022 0900 Gross per 24 hour  Intake 904.78 ml  Output 1400 ml  Net -495.22 ml    PULM  CTAB CV  RRR VASC  foot is warm and well-perfused.  VAC is in place.  Laboratory CBC    Component Value Date/Time   WBC 9.6 04/13/2022 0337   HGB 10.0 (L) 04/13/2022 0337   HCT 30.9 (L) 04/13/2022 0337   PLT 196 04/13/2022 0337    BMET    Component Value Date/Time   NA 138 04/13/2022 0337   K 3.7 04/13/2022 0337   CL 108 04/13/2022 0337   CO2 26 04/13/2022 0337   GLUCOSE 172 (H) 04/13/2022 0337   BUN 8 04/13/2022 0337   CREATININE 0.65 04/13/2022 0337   CALCIUM 8.8 (L) 04/13/2022 0337   GFRNONAA >60 04/13/2022 0337   GFRAA >60 11/08/2015 0419    Assessment/Planning: POD #2 s/p left femoral endarterectomy and left and right iliac stent placement and aortic angioplasty  Perfusion appears markedly improved. Wound VAC can come off next Wednesday Follow-up in office next week for wound check as well as to discuss carotid and subclavian surgery that would need to be done later in the month   [name]  04/13/2022, 12:22 PM

## 2022-04-13 NOTE — Discharge Summary (Addendum)
Physician Discharge Summary   Patient: Autumn Arnold MRN: [id number]  DOB: Feb 15, 1951   Admit:     Date of Admission: 04/04/2022 Admitted from: home   Discharge: Date of discharge: 04/13/22 Disposition: Home health Condition at discharge: good  CODE STATUS: FULL   Diet recommendation: Cardiac and Carb modified diet   Discharge Physician: Emeterio Reeve, DO [facility]     PCP: [name], MD  Recommendations for Outpatient Follow-up:  Follow up with PCP [name], MD in 1-2 weeks Please obtain labs/tests: CBC, BMP in 1-2 weeks Please follow up on the following pending results: none Follow as directed w/ vascular surgery and neurology     Brief Narrative / Hospital Course:  71 year old F with PMH of CVA, OSA, DM-2, HTN, PVD s/p aortoiliac and SMA stent on DAPT and mild cognitive deficits, anxiety and tobacco use disorder presenting with altered mental status, blurry vision, aphasia and left arm numbness and weakness, and admitted 05/31/230for CVA evaluation.  She was hypotensive to 70s/40s.  MRI brain concerning for right frontoparietal acute to early subacute ischemic CVA.  Reportedly, her symptoms resolved in ED except for some residual left arm weakness and numbness.  There was also concern about critical left limb ischemia.  Neurology and vascular surgery consulted.  The next day, CTA head and neck showed unchanged occlusion of right common carotid and plaque at the proximal right and left ICA causing less than 50% stenosis.  No new occlusion or hemodynamically significant stenosis noted.  TTE, carotid US and lower extremity angiography ordered. TTE with without significant finding.  Angiograph showed occluded right CCA filling through collaterals, 80 to 90% stenosis of the right subclavian artery, occluded left common iliac artery, patent right common iliac artery stent.  Cardiology cleared patient for revascularization.  Vascular surgery  performed endarterectomy performed 04/11/22. Did well overnight. Medically stable for transfer to floor 04/12/22 and stable into 06/09, cleared for discharge by vascular surgery, PT/OT recommending HH which has been arranged.  ADDENDUM: pt was discharged but RN reported seemed confused, got into the wrong car, husband had forgotten he'd valet parked and as looking around for his keys, discharge held and patient was reassessed, she is alert/oriented and I determined she has capacity to decide to go home, advised husband he needs to follow up with his primary care team if concerns for memory problems    Consultants:  Neurology Vascular surgery Cardiology    Procedures: 04/05/22: angiography  04/11/22: Left common femoral, profunda femoris, and superficial femoral artery endarterectomies and patch angioplasty, Stent placement to bilateral common iliac arteries and left external iliac artery, balloon angioplasty of the infrarenal abdominal aorta    Discharge Diagnoses: Principal Problem:   Acute CVA (cerebrovascular accident) ([place]) Active Problems:   Critical limb ischemia of left lower extremity ([place])   Hypotension   Carotid artery occlusion with infarction ([place])   Type 2 diabetes mellitus with hyperlipidemia ([place])   Iron deficiency anemia   Anxiety and depression   OSA (obstructive sleep apnea)   Tobacco dependence   COPD (chronic obstructive pulmonary disease) ([place])   Hypomagnesemia    Assessment & Plan:  Acute CVA (cerebrovascular accident) ([place]) Carotid artery occlusion with infarction ([place]) History of CVA (cerebrovascular accident) Hx TIA MRI concerning for acute to early subacute right frontoparietal CVA.  She seems to have residual left lower extremity weakness which improved over hospitalization.  Per family, speech is back to baseline.  CTA head and neck with chronic right  CCA occlusion with collaterals from external carotid artery.TTE without significant finding.  LDL 64.   A1c 7.6%.   -SBP goal 120s to 140s.  Out of permissive HTN window. -Continue statin, Plavix and aspirin. -Encourage tobacco cessation. -Continue therapy -follow w/ neuology outpatient    COPD (chronic obstructive pulmonary disease) (HCC) Tobacco dependence Stable.  Over 60-pack-year history.  Currently smokes about a pack a day.  Discussed the importance of smoking cessation.  -Nicotine patch as needed   Critical limb ischemia of left lower extremity (HCC) PVD s/p aorto iliac and SMA stent 06/2021.  She has some left foot erythema with no palpable DP or PT pulse on my exam. Angiograph showed occluded right CCA filling through collaterals, 80 to 90% stenosis of the right subclavian artery, occluded left common iliac artery, patent right common iliac artery stent. -Continue Plavix, aspirin and atorvastatin. -Pain control with as needed Tylenol, tramadol and oxycodone -Encouraged smoking cessation -stenting and angioplasty 04/11/22, follow as directed w/ vascular    Hypotension BP lowest was 70/48.  Resolved. -Continue reduced dose of metoprolol -have d/c IV fluid, pt tolerating po and BP have improved    Type 2 diabetes mellitus with hyperlipidemia (HCC) A1c 7.6%. Continue SSI and statin inpatient, resume home meds on d/c   Iron deficiency anemia H&H relatively stable.  No report of melena or hematochezia.   Anemia panel suggests iron deficiency. -IV ferric gluconate 250 mg x 1 -Monitor H&H -slightly decreased H/H likely d/t surgery, no bleeding, follow outpatient   Anxiety and depression Stable.  Continue buspirone, sertraline and amitriptyline   OSA (obstructive sleep apnea) Continue oxygen at night   Hypomagnesemia Monitor replenish as appropriate       Discharge Instructions  Discharge Instructions     Call MD for:  difficulty breathing, headache or visual disturbances   Complete by: As directed    Call MD for:  extreme fatigue   Complete by: As directed     Call MD for:  persistant dizziness or light-headedness   Complete by: As directed    Call MD for:  redness, tenderness, or signs of infection (pain, swelling, redness, odor or green/yellow discharge around incision site)   Complete by: As directed    Call MD for:  severe uncontrolled pain   Complete by: As directed    Diet - low sodium heart healthy   Complete by: As directed    Diet Carb Modified   Complete by: As directed    Discharge wound care:   Complete by: As directed    [place]. Otherwise keep surgical sites clean/dry and change bandages 1-2 times per day.   Increase activity slowly   Complete by: As directed        Allergies as of 04/13/2022       Reactions   Lisinopril Other (See Comments)   Tongue burning sensation   Plavix [clopidogrel] Other (See Comments)   Pt denies allergy.        Medication List     STOP taking these medications    HYDROcodone-acetaminophen 5-325 MG tablet Commonly known as: NORCO/VICODIN       TAKE these medications    albuterol 108 (90 Base) MCG/ACT inhaler Commonly known as: VENTOLIN HFA Inhale 2 puffs into the lungs as needed.   alendronate 70 MG tablet Commonly known as: FOSAMAX Take 70 mg by mouth once a week. Take with a full glass of water on an empty stomach.  amitriptyline 150 MG tablet Commonly known as: ELAVIL Take by mouth daily. Reported on 12/20/2015   aspirin EC 81 MG tablet Take 1 tablet (81 mg total) by mouth daily.   atorvastatin 80 MG tablet Commonly known as: LIPITOR Take 80 mg by mouth daily.   busPIRone 5 MG tablet Commonly known as: BUSPAR Take 5 mg by mouth 2 (two) times daily.   Calcium Carb-Cholecalciferol 600-400 MG-UNIT Tabs Take 1 tablet by mouth 2 (two) times daily.   clopidogrel 75 MG tablet Commonly known as: PLAVIX Take 1 tablet (75 mg total) by mouth daily.   gabapentin 600 MG tablet Commonly known as: NEURONTIN Take  1,200 mg by mouth 2 (two) times daily.   metFORMIN 1000 MG tablet Commonly known as: GLUCOPHAGE Take 1,000 mg by mouth 2 (two) times daily.   metoprolol tartrate 25 MG tablet Commonly known as: LOPRESSOR Take 0.5 tablets (12.5 mg total) by mouth 2 (two) times daily. What changed:  medication strength how much to take   montelukast 10 MG tablet Commonly known as: SINGULAIR Take 10 mg by mouth daily.   multivitamin with minerals Tabs tablet Take 1 tablet by mouth daily.   nicotine 21 mg/24hr patch Commonly known as: NICODERM CQ - dosed in mg/24 hours Place 1 patch (21 mg total) onto the skin daily.   oxyCODONE-acetaminophen 5-325 MG tablet Commonly known as: PERCOCET/ROXICET Take 1-2 tablets by mouth every 4 (four) hours as needed for moderate pain.   pantoprazole 40 MG tablet Commonly known as: PROTONIX Take 40 mg by mouth daily.   senna-docusate 8.6-50 MG tablet Commonly known as: Senokot-S Take 2 tablets by mouth at bedtime as needed for mild constipation.   sertraline 50 MG tablet Commonly known as: Zoloft Take 3 tablets (150 mg total) by mouth daily.   tiZANidine 4 MG tablet Commonly known as: ZANAFLEX Take 4 mg by mouth 2 (two) times daily.   vitamin B-12 1000 MCG tablet Commonly known as: CYANOCOBALAMIN Take 1,000 mcg by mouth daily. Reported on 12/20/2015               Durable Medical Equipment  (From admission, onward)           Start     Ordered   04/12/22 1435  For home use only DME 3 n 1  Once       Comments: Patient is not able to walk the distance required to go the bathroom, or he/she is unable to safely negotiate stairs required to access the bathroom.  A 3in1 BSC will alleviate this problem   04/12/22 1437   04/12/22 1433  For home use only DME Walker rolling  Once       Comments: Youth size rolling walker  Question Answer Comment  Walker: With [place]   Patient needs a walker to treat with the following condition Weakness  generalized      04/12/22 1434              Discharge Care Instructions  (From admission, onward)           Start     Ordered   04/13/22 0000  Discharge wound care:       Comments: ANY CONCERNS FOR BLEEDING PLEASE [place]. Otherwise keep surgical sites clean/dry and change bandages 1-2 times per day.   04/13/22 0911            Diet Orders (From admission, onward)     Start  Ordered   04/13/22 0000  Diet - low sodium heart healthy        04/13/22 0911   04/13/22 0000  Diet Carb Modified        04/13/22 0911   04/11/22 1829  Diet Carb Modified Fluid consistency: Thin; Room service appropriate? Yes  Diet effective now       Question Answer Comment  Diet-HS Snack? Nothing   Calorie Level Medium 1600-2000   Fluid consistency: Thin   Room service appropriate? Yes      04/11/22 1828              Follow-up Information     Dew, Erskine Squibb, MD Follow up.   Specialties: Vascular Surgery, Radiology, Interventional Cardiology Why: Return in 1 week to discuss carotid surgery Contact information: [place] [address] [phone]                 Allergies  Allergen Reactions   Lisinopril Other (See Comments)    Tongue burning sensation   Plavix [Clopidogrel] Other (See Comments)    Pt denies allergy.     Subjective: Pt feeling well this morning, no complaints, feels ready to go home and no additional concerns    Discharge Exam: Vitals:   04/13/22 0600 04/13/22 0856  BP: (!) 127/40 (!) 137/46  Pulse: 88 (!) 102  Resp: (!) 29   Temp: 98 F (36.7 C)   SpO2: 92%    Vitals:   04/12/22 2139 04/13/22 0000 04/13/22 0600 04/13/22 0856  BP: (!) 153/87 (!) 158/60 (!) 127/40 (!) 137/46  Pulse: (!) 112 90 88 (!) 102  Resp:  (!) 24 (!) 29   Temp:  98.4 F (36.9 C) 98 F (36.7 C)   TempSrc:  Oral Oral   SpO2:  97% 92%   Weight:      Height:        General: Pt is alert, awake, not in acute  distress Cardiovascular: RRR, S1/S2 +, no rubs, no gallops Respiratory: CTA bilaterally, no wheezing, no rhonchi Abdominal: Soft, NT, ND, bowel sounds + Extremities: no edema, no cyanosis     The results of significant diagnostics from this hospitalization (including imaging, microbiology, ancillary and laboratory) are listed below for reference.     Microbiology: Recent Results (from the past 240 hour(s))  MRSA Next Gen by PCR, Nasal     Status: None   Collection Time: 04/11/22  2:37 PM   Specimen: Nasal Mucosa; Nasal Swab  Result Value Ref Range Status   MRSA by PCR Next Gen NOT DETECTED NOT DETECTED Final    Comment: (NOTE) The GeneXpert MRSA Assay (FDA approved for NASAL specimens only), is one component of a comprehensive MRSA colonization surveillance program. It is not intended to diagnose MRSA infection nor to guide or monitor treatment for MRSA infections. Test performance is not FDA approved in patients less than 68 years old. Performed at [facility], [address] [place], [place] [address]      Labs: BNP (last 3 results) No results for input(s): "BNP" in the last 8760 hours. Basic Metabolic Panel: Recent Labs  Lab 04/07/22 0447 04/09/22 0424 04/11/22 0600 04/11/22 2332 04/12/22 0318 04/13/22 0337  NA 136 139 135 136  --  138  K 4.4 4.5 4.3 4.0  --  3.7  CL 104 108 103 109  --  108  CO2 '26 23 26 22  '$ --  26  GLUCOSE 143* 140* 141* 264*  --  172*  BUN '14 12 14 11  '$ --  8  CREATININE 0.87 0.70 0.78 0.76  --  0.65  CALCIUM 8.6* 8.7* 8.9 8.3*  --  8.8*  MG 1.8 1.7  --   --  1.9 1.8  PHOS 3.9 3.8  --   --  3.3 3.6   Liver Function Tests: Recent Labs  Lab 04/07/22 0447 04/09/22 0424  ALBUMIN 3.1* 3.2*   No results for input(s): "LIPASE", "AMYLASE" in the last 168 hours. No results for input(s): "AMMONIA" in the last 168 hours. CBC: Recent Labs  Lab 04/07/22 0447 04/09/22 0424 04/11/22 0600 04/11/22 2332 04/13/22 0337  WBC 6.9 7.2  7.3 11.4* 9.6  NEUTROABS  --   --  4.4  --   --   HGB 10.7* 11.9* 11.8* 9.8* 10.0*  HCT 32.3* 36.9 36.3 30.2* 30.9*  MCV 88.0 88.3 89.0 89.6 89.3  PLT 167 190 206 189 196   Cardiac Enzymes: No results for input(s): "CKTOTAL", "CKMB", "CKMBINDEX", "TROPONINI" in the last 168 hours. BNP: Invalid input(s): "POCBNP" CBG: Recent Labs  Lab 04/12/22 0744 04/12/22 1223 04/12/22 1539 04/12/22 1919 04/13/22 0730  GLUCAP 143* 271* 143* 199* 168*   D-Dimer No results for input(s): "DDIMER" in the last 72 hours. Hgb A1c No results for input(s): "HGBA1C" in the last 72 hours. Lipid Profile No results for input(s): "CHOL", "HDL", "LDLCALC", "TRIG", "CHOLHDL", "LDLDIRECT" in the last 72 hours. Thyroid function studies No results for input(s): "TSH", "T4TOTAL", "T3FREE", "THYROIDAB" in the last 72 hours.  Invalid input(s): "FREET3" Anemia work up No results for input(s): "VITAMINB12", "FOLATE", "FERRITIN", "TIBC", "IRON", "RETICCTPCT" in the last 72 hours. Urinalysis    Component Value Date/Time   COLORURINE YELLOW (A) 06/22/2021 2052   APPEARANCEUR CLEAR (A) 06/22/2021 2052   LABSPEC 1.010 06/22/2021 2052   PHURINE 6.0 06/22/2021 2052   GLUCOSEU 50 (A) 06/22/2021 2052   HGBUR NEGATIVE 06/22/2021 2052   BILIRUBINUR NEGATIVE 06/22/2021 2052   KETONESUR 5 (A) 06/22/2021 2052   PROTEINUR NEGATIVE 06/22/2021 2052   NITRITE NEGATIVE 06/22/2021 2052   LEUKOCYTESUR NEGATIVE 06/22/2021 2052   Sepsis Labs Recent Labs  Lab 04/09/22 0424 04/11/22 0600 04/11/22 2332 04/13/22 0337  WBC 7.2 7.3 11.4* 9.6   Microbiology Recent Results (from the past 240 hour(s))  MRSA Next Gen by PCR, Nasal     Status: None   Collection Time: 04/11/22  2:37 PM   Specimen: Nasal Mucosa; Nasal Swab  Result Value Ref Range Status   MRSA by PCR Next Gen NOT DETECTED NOT DETECTED Final    Comment: (NOTE) The GeneXpert MRSA Assay (FDA approved for NASAL specimens only), is one component of a comprehensive  MRSA colonization surveillance program. It is not intended to diagnose MRSA infection nor to guide or monitor treatment for MRSA infections. Test performance is not FDA approved in patients less than 42 years old. Performed at [facility], [place], [place] [address]    Imaging PERIPHERAL VASCULAR CATHETERIZATION  Result Date: 04/05/2022 See surgical note for result.  US Carotid Bilateral  Result Date: 04/05/2022 CLINICAL DATA:  Cerebrovascular accident EXAM: BILATERAL CAROTID DUPLEX ULTRASOUND TECHNIQUE: Pearline Cables scale imaging, color Doppler and duplex ultrasound were performed of bilateral carotid and vertebral arteries in the neck. COMPARISON:  None Available. FINDINGS: Criteria: Quantification of carotid stenosis is based on velocity parameters that correlate the residual internal carotid diameter with NASCET-based stenosis levels, using the diameter of the distal internal carotid lumen as the denominator for  stenosis measurement. The following velocity measurements were obtained: RIGHT ICA: 139/43 cm/sec CCA: 63/1 cm/sec SYSTOLIC ICA/CCA RATIO:  2.1 ECA:  114 cm/sec LEFT ICA: 208/31 cm/sec CCA: 497/02 cm/sec SYSTOLIC ICA/CCA RATIO:  1.5 ECA:  190 cm/sec RIGHT CAROTID ARTERY: No flow visualized within the right proximal and mid common carotid artery. Flow is visualized distally in the common carotid artery just proximal to the bifurcation. The external carotid artery flow is reversed. Abnormal arterial waveform in the internal carotid artery. Modest heterogeneous atherosclerotic plaque in the proximal internal carotid artery. Evaluation of peak systolic velocity criteria is limited in the setting of proximal common carotid occlusion. RIGHT VERTEBRAL ARTERY: Retrograde flow in the right vertebral artery. LEFT CAROTID ARTERY: Heterogeneous and irregular/ulcerated atherosclerotic plaque in the left internal carotid artery. Peak systolic velocity is likely spuriously elevated  secondary to contralateral occlusion. LEFT VERTEBRAL ARTERY:  Patent with normal antegrade flow. IMPRESSION: 1. Right common carotid artery occlusion with distal reconstitution via retrograde flow through the external carotid artery. 2. Retrograde right vertebral artery flow. This may be due to concurrent stenosis or occlusion in the right brachiocephalic artery. 3. Moderate heterogeneous and irregular/ulcerated atherosclerotic plaque in the proximal left internal carotid artery. Elevation of the peak systolic velocity is likely greater than expected for the degree of stenosis secondary to the contralateral common carotid artery occlusion. Please refer to recent CT arteriogram of the neck for degree of stenosis. 4. Left vertebral artery flow is antegrade. Electronically Signed   By: Jacqulynn Cadet M.D.   On: 04/05/2022 15:56   ECHOCARDIOGRAM COMPLETE  Result Date: 04/05/2022    ECHOCARDIOGRAM REPORT   Patient Name:   Autumn Arnold Date of Exam: 04/05/2022 Medical Rec #:  [id number]      Height:       60.0 in Accession #:    [id number]     Weight:       140.0 lb Date of Birth:  [birth year]       BSA:          1.604 m Patient Age:    71 years       BP:           155/53 mmHg Patient Gender: F              HR:           82 bpm. Exam Location:  [facility] Procedure: 2D Echo, Color Doppler and Cardiac Doppler Indications:     I63.9 Stroke  History:         Patient has prior history of Echocardiogram examinations, most                  recent 06/24/2021. COPD; Risk Factors:Hypertension, Diabetes,                  Dyslipidemia and Sleep Apnea.  Sonographer:     Charmayne Sheer Referring Phys:  [id number] Athena Masse Diagnosing Phys: Isaias Cowman MD IMPRESSIONS  1. Left ventricular ejection fraction, by estimation, is 60 to 65%. The left ventricle has normal function. The left ventricle has no regional wall motion abnormalities. Left ventricular diastolic parameters were normal.  2. Right ventricular systolic function is  normal. The right ventricular size is normal.  3. The mitral valve is normal in structure. Moderate mitral valve regurgitation. No evidence of mitral stenosis.  4. The aortic valve is normal in structure. Aortic valve regurgitation is mild. No aortic stenosis is present.  5. The inferior vena cava  is normal in size with greater than 50% respiratory variability, suggesting right atrial pressure of 3 mmHg. FINDINGS  Left Ventricle: Left ventricular ejection fraction, by estimation, is 60 to 65%. The left ventricle has normal function. The left ventricle has no regional wall motion abnormalities. The left ventricular internal cavity size was normal in size. There is  no left ventricular hypertrophy. Left ventricular diastolic parameters were normal. Right Ventricle: The right ventricular size is normal. No increase in right ventricular wall thickness. Right ventricular systolic function is normal. Left Atrium: Left atrial size was normal in size. Right Atrium: Right atrial size was normal in size. Pericardium: There is no evidence of pericardial effusion. Mitral Valve: The mitral valve is normal in structure. Moderate mitral valve regurgitation. No evidence of mitral valve stenosis. MV peak gradient, 8.0 mmHg. The mean mitral valve gradient is 4.0 mmHg. Tricuspid Valve: The tricuspid valve is normal in structure. Tricuspid valve regurgitation is mild . No evidence of tricuspid stenosis. Aortic Valve: The aortic valve is normal in structure. Aortic valve regurgitation is mild. Aortic regurgitation PHT measures 532 msec. No aortic stenosis is present. Aortic valve mean gradient measures 3.0 mmHg. Aortic valve peak gradient measures 4.9 mmHg. Aortic valve area, by VTI measures 1.17 cm. Pulmonic Valve: The pulmonic valve was normal in structure. Pulmonic valve regurgitation is not visualized. No evidence of pulmonic stenosis. Aorta: The aortic root is normal in size and structure. Venous: The inferior vena cava is normal  in size with greater than 50% respiratory variability, suggesting right atrial pressure of 3 mmHg. IAS/Shunts: No atrial level shunt detected by color flow Doppler.  LEFT VENTRICLE PLAX 2D LVIDd:         4.06 cm   Diastology LVIDs:         2.46 cm   LV e' medial:    5.98 cm/s LV PW:         0.97 cm   LV E/e' medial:  16.4 LV IVS:        0.86 cm   LV e' lateral:   7.40 cm/s LVOT diam:     1.40 cm   LV E/e' lateral: 13.3 LV SV:         30 LV SV Index:   19 LVOT Area:     1.54 cm  RIGHT VENTRICLE RV Basal diam:  2.54 cm RV S prime:     10.20 cm/s LEFT ATRIUM             Index        RIGHT ATRIUM           Index LA diam:        3.50 cm 2.18 cm/m   RA Area:     12.30 cm LA Vol (A2C):   45.0 ml 28.05 ml/m  RA Volume:   25.20 ml  15.71 ml/m LA Vol (A4C):   37.1 ml 23.13 ml/m LA Biplane Vol: 42.6 ml 26.56 ml/m  AORTIC VALVE                    PULMONIC VALVE AV Area (Vmax):    1.30 cm     PV Vmax:       0.77 m/s AV Area (Vmean):   1.09 cm     PV Vmean:      52.200 cm/s AV Area (VTI):     1.17 cm     PV VTI:        0.158 m AV Vmax:  111.00 cm/s  PV Peak grad:  2.4 mmHg AV Vmean:          85.400 cm/s  PV Mean grad:  1.0 mmHg AV VTI:            0.260 m AV Peak Grad:      4.9 mmHg AV Mean Grad:      3.0 mmHg LVOT Vmax:         94.00 cm/s LVOT Vmean:        60.700 cm/s LVOT VTI:          0.197 m LVOT/AV VTI ratio: 0.76 AI PHT:            532 msec  AORTA Ao Root diam: 2.80 cm MITRAL VALVE MV Area (PHT): 7.09 cm    SHUNTS MV Area VTI:   0.95 cm    Systemic VTI:  0.20 m MV Peak grad:  8.0 mmHg    Systemic Diam: 1.40 cm MV Mean grad:  4.0 mmHg MV Vmax:       1.41 m/s MV Vmean:      91.5 cm/s MV Decel Time: 107 msec MV E velocity: 98.10 cm/s MV A velocity: 99.00 cm/s MV E/A ratio:  0.99 Isaias Cowman MD Electronically signed by Isaias Cowman MD Signature Date/Time: 04/05/2022/1:24:54 PM    Final       Time coordinating discharge: Over 30 minutes  SIGNED:  Emeterio Reeve DO [facility]

## 2022-04-13 NOTE — TOC Transition Note (Signed)
Transition of Care [facility]) - CM/SW Discharge Note   Patient Details  Name: Autumn Arnold MRN: [id number] Date of Birth: 06-16-1951  Transition of Care [facility]) CM/SW Contact:  Autumn Hutching, RN Phone Number: 04/13/2022, 10:46 AM   Clinical Narrative:    Patient is medically cleared for discharge home with home health services today.  Husband is at the bedside and ready to take patient home.  Youth RW and 3 in 1 delivered to the bedside for patient to take home.  Patient has been arranged with [facility] for home health, RN, PT, OT, aide and SW.   Patient's sister called and reported concerns about patient returning home, she says that patient's memory is bad and her husband also has some mental impairments from long term alcohol use.  PT and OT are recommending HH, RNCM explained that unless they can private patient SNF is not an option at this time.  Informed sister that is she believes the patient is in an unsafe living environment that she should make an APS report.    Sister verbalized understanding.   Husband will transport patient home.    Final next level of care: [place] Barriers to Discharge: Barriers Resolved   Patient Goals and CMS Choice Patient states their goals for this hospitalization and ongoing recovery are:: Patient would like to get back home CMS Medicare.gov Compare Post Acute Care list provided to:: Patient Choice offered to / list presented to : Patient  Discharge Placement                       Discharge Plan and Services   Discharge Planning Services: CM Consult Post Acute Care Choice: Home Health          DME Arranged: [place] youth, 3-N-1 DME Agency: [employer] Date DME Agency Contacted: 04/13/22 Time DME Agency Contacted: 1000   HH Arranged: RN, PT, OT, Nurse's Aide, Social Work [employer] Agency: [place] Date [place]: 04/13/22 Time [place]: 41 Representative spoke with at [place]:  Autumn Arnold  Social Determinants of Health ([place]) Interventions     Readmission Risk Interventions    04/13/2022   10:44 AM 06/23/2021    2:20 PM  Readmission Risk Prevention Plan  Post Dischage Appt  Complete  Medication Screening  Complete  Transportation Screening Complete Complete  PCP or Specialist Appt within 3-5 Days Complete   HRI or [place] Complete   Social Work Consult for [place] Planning/Counseling Complete   Palliative Care Screening Not Applicable   Medication Review Press photographer) Complete

## 2022-04-13 NOTE — Progress Notes (Signed)
Patient A/O x4 to standard orientation questions, with intermittent confusion requiring reminders and prompting. Patient given stroke education, education on wound vac,and medication instructions. Patient to call and schedule follow up appointment. Patient taken downstairs for discharge. Patient attempted to get in car with the wrong person while husband was getting car. Patient husband returned unable to find keys. Husband then expresses he cannot find car, cannot remember where he parked. Due to concern for patient safety, patient brought back upstairs and MD notified of situation.   1400 Family called for updates and notified.  40 MD cleared patient for discharge. Patient discharged with husband.

## 2022-04-13 NOTE — Discharge Instructions (Signed)
Follow up appointment with Dr Ozella Almond office, return in 1 week. Please call office to schedule [phone].

## 2022-04-16 ENCOUNTER — Telehealth (INDEPENDENT_AMBULATORY_CARE_PROVIDER_SITE_OTHER): Payer: Self-pay | Admitting: Nurse Practitioner

## 2022-04-16 ENCOUNTER — Other Ambulatory Visit (INDEPENDENT_AMBULATORY_CARE_PROVIDER_SITE_OTHER): Payer: Self-pay | Admitting: Nurse Practitioner

## 2022-04-16 MED ORDER — OXYCODONE-ACETAMINOPHEN 5-325 MG PO TABS: 1.0000 | ORAL_TABLET | ORAL | 0 refills | Status: DC | PRN

## 2022-04-16 NOTE — Telephone Encounter (Signed)
I spoke with Autumn Arnold and it was sent in

## 2022-04-16 NOTE — Telephone Encounter (Signed)
Patients daughter called in earlier this morning stating that the patients medication was never sent into the pharmacy following discharge on Saturday from the hospital. Spoke with NP Eulogio Ditch and she looked and seen that another Provider printed the Rx and should have been in her discharge paper work. Patient called back and left a vm stating she was told it was sent into the pharmacy for her and not printed.    Please call and advise

## 2022-04-19 ENCOUNTER — Emergency Department: Payer: Medicare HMO

## 2022-04-19 ENCOUNTER — Other Ambulatory Visit: Payer: Self-pay

## 2022-04-19 ENCOUNTER — Telehealth (INDEPENDENT_AMBULATORY_CARE_PROVIDER_SITE_OTHER): Payer: Self-pay

## 2022-04-19 ENCOUNTER — Encounter: Payer: Self-pay | Admitting: Internal Medicine

## 2022-04-19 ENCOUNTER — Inpatient Hospital Stay
Admission: EM | Admit: 2022-04-19 | Discharge: 2022-04-22 | DRG: 871 | Disposition: A | Discharge status: Home Health | Payer: Medicare HMO | Attending: Internal Medicine | Admitting: Internal Medicine

## 2022-04-19 ENCOUNTER — Observation Stay: Payer: Medicare HMO

## 2022-04-19 DIAGNOSIS — E785 Hyperlipidemia, unspecified: Secondary | ICD-10-CM | POA: Diagnosis present

## 2022-04-19 DIAGNOSIS — N179 Acute kidney failure, unspecified: Secondary | ICD-10-CM | POA: Diagnosis not present

## 2022-04-19 DIAGNOSIS — R531 Weakness: Secondary | ICD-10-CM | POA: Diagnosis present

## 2022-04-19 DIAGNOSIS — J449 Chronic obstructive pulmonary disease, unspecified: Secondary | ICD-10-CM | POA: Diagnosis present

## 2022-04-19 DIAGNOSIS — I959 Hypotension, unspecified: Secondary | ICD-10-CM | POA: Diagnosis not present

## 2022-04-19 DIAGNOSIS — R109 Unspecified abdominal pain: Secondary | ICD-10-CM | POA: Diagnosis not present

## 2022-04-19 DIAGNOSIS — Z95820 Peripheral vascular angioplasty status with implants and grafts: Secondary | ICD-10-CM

## 2022-04-19 DIAGNOSIS — I69311 Memory deficit following cerebral infarction: Secondary | ICD-10-CM | POA: Diagnosis not present

## 2022-04-19 DIAGNOSIS — J440 Chronic obstructive pulmonary disease with acute lower respiratory infection: Secondary | ICD-10-CM | POA: Diagnosis not present

## 2022-04-19 DIAGNOSIS — Z888 Allergy status to other drugs, medicaments and biological substances status: Secondary | ICD-10-CM

## 2022-04-19 DIAGNOSIS — J189 Pneumonia, unspecified organism: Secondary | ICD-10-CM | POA: Diagnosis not present

## 2022-04-19 DIAGNOSIS — M1990 Unspecified osteoarthritis, unspecified site: Secondary | ICD-10-CM | POA: Diagnosis present

## 2022-04-19 DIAGNOSIS — E860 Dehydration: Secondary | ICD-10-CM | POA: Diagnosis present

## 2022-04-19 DIAGNOSIS — G4733 Obstructive sleep apnea (adult) (pediatric): Secondary | ICD-10-CM | POA: Diagnosis not present

## 2022-04-19 DIAGNOSIS — Z7983 Long term (current) use of bisphosphonates: Secondary | ICD-10-CM

## 2022-04-19 DIAGNOSIS — E1151 Type 2 diabetes mellitus with diabetic peripheral angiopathy without gangrene: Secondary | ICD-10-CM | POA: Diagnosis present

## 2022-04-19 DIAGNOSIS — M5126 Other intervertebral disc displacement, lumbar region: Secondary | ICD-10-CM | POA: Diagnosis present

## 2022-04-19 DIAGNOSIS — Z7982 Long term (current) use of aspirin: Secondary | ICD-10-CM | POA: Diagnosis not present

## 2022-04-19 DIAGNOSIS — D72829 Elevated white blood cell count, unspecified: Secondary | ICD-10-CM | POA: Diagnosis not present

## 2022-04-19 DIAGNOSIS — F0394 Unspecified dementia, unspecified severity, with anxiety: Secondary | ICD-10-CM | POA: Diagnosis present

## 2022-04-19 DIAGNOSIS — I672 Cerebral atherosclerosis: Secondary | ICD-10-CM | POA: Diagnosis not present

## 2022-04-19 DIAGNOSIS — R339 Retention of urine, unspecified: Secondary | ICD-10-CM | POA: Diagnosis present

## 2022-04-19 DIAGNOSIS — K219 Gastro-esophageal reflux disease without esophagitis: Secondary | ICD-10-CM | POA: Diagnosis present

## 2022-04-19 DIAGNOSIS — I70202 Unspecified atherosclerosis of native arteries of extremities, left leg: Secondary | ICD-10-CM | POA: Diagnosis present

## 2022-04-19 DIAGNOSIS — Z8261 Family history of arthritis: Secondary | ICD-10-CM | POA: Diagnosis not present

## 2022-04-19 DIAGNOSIS — I739 Peripheral vascular disease, unspecified: Secondary | ICD-10-CM

## 2022-04-19 DIAGNOSIS — F17210 Nicotine dependence, cigarettes, uncomplicated: Secondary | ICD-10-CM | POA: Diagnosis present

## 2022-04-19 DIAGNOSIS — E1169 Type 2 diabetes mellitus with other specified complication: Secondary | ICD-10-CM | POA: Diagnosis not present

## 2022-04-19 DIAGNOSIS — I10 Essential (primary) hypertension: Secondary | ICD-10-CM | POA: Diagnosis present

## 2022-04-19 DIAGNOSIS — F32A Depression, unspecified: Secondary | ICD-10-CM | POA: Diagnosis present

## 2022-04-19 DIAGNOSIS — Z79899 Other long term (current) drug therapy: Secondary | ICD-10-CM

## 2022-04-19 DIAGNOSIS — R0602 Shortness of breath: Secondary | ICD-10-CM | POA: Diagnosis not present

## 2022-04-19 DIAGNOSIS — R6520 Severe sepsis without septic shock: Secondary | ICD-10-CM | POA: Diagnosis present

## 2022-04-19 DIAGNOSIS — Z833 Family history of diabetes mellitus: Secondary | ICD-10-CM

## 2022-04-19 DIAGNOSIS — R0902 Hypoxemia: Secondary | ICD-10-CM | POA: Diagnosis not present

## 2022-04-19 DIAGNOSIS — Z7902 Long term (current) use of antithrombotics/antiplatelets: Secondary | ICD-10-CM

## 2022-04-19 DIAGNOSIS — I639 Cerebral infarction, unspecified: Secondary | ICD-10-CM | POA: Diagnosis not present

## 2022-04-19 DIAGNOSIS — R296 Repeated falls: Secondary | ICD-10-CM | POA: Diagnosis present

## 2022-04-19 DIAGNOSIS — I708 Atherosclerosis of other arteries: Secondary | ICD-10-CM | POA: Diagnosis present

## 2022-04-19 DIAGNOSIS — A419 Sepsis, unspecified organism: Secondary | ICD-10-CM | POA: Diagnosis not present

## 2022-04-19 DIAGNOSIS — F0393 Unspecified dementia, unspecified severity, with mood disturbance: Secondary | ICD-10-CM | POA: Diagnosis present

## 2022-04-19 DIAGNOSIS — M4316 Spondylolisthesis, lumbar region: Secondary | ICD-10-CM | POA: Diagnosis not present

## 2022-04-19 DIAGNOSIS — R000 Tachycardia, unspecified: Secondary | ICD-10-CM | POA: Diagnosis not present

## 2022-04-19 DIAGNOSIS — Z90710 Acquired absence of both cervix and uterus: Secondary | ICD-10-CM

## 2022-04-19 DIAGNOSIS — Z8249 Family history of ischemic heart disease and other diseases of the circulatory system: Secondary | ICD-10-CM | POA: Diagnosis not present

## 2022-04-19 DIAGNOSIS — F419 Anxiety disorder, unspecified: Secondary | ICD-10-CM | POA: Diagnosis present

## 2022-04-19 DIAGNOSIS — R0689 Other abnormalities of breathing: Secondary | ICD-10-CM | POA: Diagnosis not present

## 2022-04-19 DIAGNOSIS — M545 Low back pain, unspecified: Secondary | ICD-10-CM

## 2022-04-19 LAB — URINALYSIS, ROUTINE W REFLEX MICROSCOPIC
Bilirubin Urine: NEGATIVE
Glucose, UA: NEGATIVE mg/dL
Hgb urine dipstick: NEGATIVE
Ketones, ur: NEGATIVE mg/dL
Leukocytes,Ua: NEGATIVE
Nitrite: NEGATIVE
Protein, ur: NEGATIVE mg/dL
Specific Gravity, Urine: 1.011 (ref 1.005–1.030)
pH: 5 (ref 5.0–8.0)

## 2022-04-19 LAB — COMPREHENSIVE METABOLIC PANEL
ALT: 16 U/L (ref 0–44)
AST: 17 U/L (ref 15–41)
Albumin: 3.3 g/dL — ABNORMAL LOW (ref 3.5–5.0)
Alkaline Phosphatase: 93 U/L (ref 38–126)
Anion gap: 8 (ref 5–15)
BUN: 22 mg/dL (ref 8–23)
CO2: 23 mmol/L (ref 22–32)
Calcium: 8.8 mg/dL — ABNORMAL LOW (ref 8.9–10.3)
Chloride: 105 mmol/L (ref 98–111)
Creatinine, Ser: 1.03 mg/dL — ABNORMAL HIGH (ref 0.44–1.00)
GFR, Estimated: 59 mL/min — ABNORMAL LOW (ref >60)
Glucose, Bld: 161 mg/dL — ABNORMAL HIGH (ref 70–99)
Potassium: 4.1 mmol/L (ref 3.5–5.1)
Sodium: 136 mmol/L (ref 135–145)
Total Bilirubin: 0.5 mg/dL (ref 0.3–1.2)
Total Protein: 7.3 g/dL (ref 6.5–8.1)

## 2022-04-19 LAB — CBC WITH DIFFERENTIAL/PLATELET
Abs Immature Granulocytes: 0.1 10*3/uL — ABNORMAL HIGH (ref 0.00–0.07)
Basophils Absolute: 0 10*3/uL (ref 0.0–0.1)
Basophils Relative: 0 %
Eosinophils Absolute: 0.2 10*3/uL (ref 0.0–0.5)
Eosinophils Relative: 1 %
HCT: 30.1 % — ABNORMAL LOW (ref 36.0–46.0)
Hemoglobin: 9.6 g/dL — ABNORMAL LOW (ref 12.0–15.0)
Immature Granulocytes: 1 %
Lymphocytes Relative: 11 %
Lymphs Abs: 1.5 10*3/uL (ref 0.7–4.0)
MCH: 28.6 pg (ref 26.0–34.0)
MCHC: 31.9 g/dL (ref 30.0–36.0)
MCV: 89.6 fL (ref 80.0–100.0)
Monocytes Absolute: 0.6 10*3/uL (ref 0.1–1.0)
Monocytes Relative: 5 %
Neutro Abs: 11.3 10*3/uL — ABNORMAL HIGH (ref 1.7–7.7)
Neutrophils Relative %: 82 %
Platelets: 304 10*3/uL (ref 150–400)
RBC: 3.36 MIL/uL — ABNORMAL LOW (ref 3.87–5.11)
RDW: 14.9 % (ref 11.5–15.5)
WBC: 13.7 10*3/uL — ABNORMAL HIGH (ref 4.0–10.5)
nRBC: 0 % (ref 0.0–0.2)

## 2022-04-19 LAB — LACTIC ACID, PLASMA: Lactic Acid, Venous: 1.3 mmol/L (ref 0.5–1.9)

## 2022-04-19 LAB — PROCALCITONIN: Procalcitonin: 0.27 ng/mL

## 2022-04-19 LAB — TROPONIN I (HIGH SENSITIVITY): Troponin I (High Sensitivity): 6 ng/L (ref <18)

## 2022-04-19 MED ORDER — LACTATED RINGERS IV BOLUS
1000.0000 mL | Freq: Once | INTRAVENOUS | Status: AC
  Administered 2022-04-19: 1000 mL via INTRAVENOUS

## 2022-04-19 MED ORDER — ALBUTEROL SULFATE (2.5 MG/3ML) 0.083% IN NEBU: 3.0000 mL | INHALATION_SOLUTION | RESPIRATORY_TRACT | Status: DC | PRN

## 2022-04-19 MED ORDER — PANTOPRAZOLE SODIUM 40 MG PO TBEC
40.0000 mg | DELAYED_RELEASE_TABLET | Freq: Every day | ORAL | Status: DC
  Administered 2022-04-20 – 2022-04-22 (×3): 40 mg via ORAL
  Filled 2022-04-19 (×3): qty 1

## 2022-04-19 MED ORDER — BUSPIRONE HCL 10 MG PO TABS
5.0000 mg | ORAL_TABLET | Freq: Two times a day (BID) | ORAL | Status: DC
  Administered 2022-04-19 – 2022-04-22 (×6): 5 mg via ORAL
  Filled 2022-04-19 (×6): qty 1

## 2022-04-19 MED ORDER — CLOPIDOGREL BISULFATE 75 MG PO TABS
75.0000 mg | ORAL_TABLET | Freq: Every day | ORAL | Status: DC
  Administered 2022-04-20 – 2022-04-22 (×3): 75 mg via ORAL
  Filled 2022-04-19 (×3): qty 1

## 2022-04-19 MED ORDER — TIZANIDINE HCL 4 MG PO TABS
4.0000 mg | ORAL_TABLET | Freq: Two times a day (BID) | ORAL | Status: DC | PRN
  Administered 2022-04-19: 4 mg via ORAL
  Filled 2022-04-19 (×2): qty 1

## 2022-04-19 MED ORDER — INSULIN ASPART 100 UNIT/ML IJ SOLN: 0.0000 [IU] | Freq: Three times a day (TID) | INTRAMUSCULAR | Status: DC

## 2022-04-19 MED ORDER — ATORVASTATIN CALCIUM 20 MG PO TABS
80.0000 mg | ORAL_TABLET | Freq: Every day | ORAL | Status: DC
  Administered 2022-04-20 – 2022-04-22 (×3): 80 mg via ORAL
  Filled 2022-04-19 (×3): qty 4

## 2022-04-19 MED ORDER — SODIUM CHLORIDE 0.9 % IV SOLN
500.0000 mg | Freq: Once | INTRAVENOUS | Status: AC
  Administered 2022-04-19: 500 mg via INTRAVENOUS
  Filled 2022-04-19: qty 5

## 2022-04-19 MED ORDER — GABAPENTIN 600 MG PO TABS
600.0000 mg | ORAL_TABLET | Freq: Two times a day (BID) | ORAL | Status: DC
  Administered 2022-04-20 – 2022-04-22 (×5): 600 mg via ORAL
  Filled 2022-04-19 (×5): qty 1

## 2022-04-19 MED ORDER — MONTELUKAST SODIUM 10 MG PO TABS
10.0000 mg | ORAL_TABLET | Freq: Every day | ORAL | Status: DC
  Administered 2022-04-20 – 2022-04-22 (×3): 10 mg via ORAL
  Filled 2022-04-19 (×3): qty 1

## 2022-04-19 MED ORDER — AMITRIPTYLINE HCL 25 MG PO TABS
150.0000 mg | ORAL_TABLET | Freq: Every day | ORAL | Status: DC
  Administered 2022-04-20 – 2022-04-21 (×2): 150 mg via ORAL
  Filled 2022-04-19 (×2): qty 6

## 2022-04-19 MED ORDER — LACTATED RINGERS IV SOLN: INTRAVENOUS | Status: DC

## 2022-04-19 MED ORDER — SERTRALINE HCL 50 MG PO TABS
150.0000 mg | ORAL_TABLET | Freq: Every day | ORAL | Status: DC
  Administered 2022-04-20 – 2022-04-22 (×3): 150 mg via ORAL
  Filled 2022-04-19 (×3): qty 3

## 2022-04-19 MED ORDER — ASPIRIN 81 MG PO TBEC
81.0000 mg | DELAYED_RELEASE_TABLET | Freq: Every day | ORAL | Status: DC
  Administered 2022-04-20 – 2022-04-22 (×3): 81 mg via ORAL
  Filled 2022-04-19 (×3): qty 1

## 2022-04-19 MED ORDER — ACETAMINOPHEN 325 MG PO TABS
650.0000 mg | ORAL_TABLET | Freq: Four times a day (QID) | ORAL | Status: DC | PRN
  Administered 2022-04-19: 650 mg via ORAL
  Filled 2022-04-19: qty 2

## 2022-04-19 MED ORDER — SODIUM CHLORIDE 0.9 % IV SOLN
1.0000 g | Freq: Once | INTRAVENOUS | Status: AC
  Administered 2022-04-19: 1 g via INTRAVENOUS
  Filled 2022-04-19: qty 10

## 2022-04-19 MED ORDER — SODIUM CHLORIDE 0.9 % IV SOLN
500.0000 mg | INTRAVENOUS | Status: DC
  Administered 2022-04-20: 500 mg via INTRAVENOUS
  Filled 2022-04-19: qty 5

## 2022-04-19 MED ORDER — VITAMIN B-12 1000 MCG PO TABS
1000.0000 ug | ORAL_TABLET | Freq: Every day | ORAL | Status: DC
  Administered 2022-04-20 – 2022-04-22 (×3): 1000 ug via ORAL
  Filled 2022-04-19 (×3): qty 1

## 2022-04-19 MED ORDER — SODIUM CHLORIDE 0.9 % IV SOLN
2.0000 g | INTRAVENOUS | Status: DC
  Administered 2022-04-20 – 2022-04-21 (×2): 2 g via INTRAVENOUS
  Filled 2022-04-19: qty 20
  Filled 2022-04-19 (×2): qty 2

## 2022-04-19 MED ORDER — ACETAMINOPHEN 650 MG RE SUPP: 650.0000 mg | Freq: Four times a day (QID) | RECTAL | Status: DC | PRN

## 2022-04-19 NOTE — ED Notes (Signed)
In and out cath, 1150 mL urine out. Specimen sent. MD notified.

## 2022-04-19 NOTE — ED Triage Notes (Signed)
C/o increasing weakness x1 week post hospitalization for stroke. Pt. Was d/c'd last Thursday. Multiple falls reported by pt's daughter, pt. States PT not going well. Daughter reports pt's has hx of dementia and stroke affected her memory/cognitive ability.  Wound vac to left groin. Pt. Denies complaints currently.

## 2022-04-19 NOTE — Telephone Encounter (Signed)
Patient sister left a voicemail stating that her sister has been falling and not able to stand. Patient sister wanted to know if she go to the ED to be evaluated. Patient had femoral endarterectomy on 04/11/22 with Dr Lucky Cowboy. I spoke with Dr Delana Meyer and he recommended for patient go to the ED for evaluation. Patient sister has been made aware with medical advice.

## 2022-04-19 NOTE — H&P (Signed)
History and Physical    Autumn Arnold HXT:[id number] DOB: 04-25-1951 DOA: 04/19/2022  PCP: [name], MD  Patient coming from: Home.  Chief Complaint: Weakness and falls.  HPI: Autumn Arnold is a 71 y.o. female with history of recent admission for stroke and at that time patient also was found to have critical limb ischemia underwent stent placement for the left lower extremity subsequently was on wound VAC has been having increasing weakness and frequent falls and also has become increasingly confused.  Has not had any fever chills nausea vomiting or diarrhea.  No new focal weakness.  ED Course: CT head and CT abdomen pelvis were unremarkable.  Patient had urine retention and had a nerve catheter following which 1000 mL of fluid was drained.  Chest x-ray shows nonspecific opacity with leukocytosis and patient be hypotensive at presentation blood cultures were obtained and started on empiric antibiotics.  Patient started on hydration admitted for generalized weakness could be from dehydration.  Review of Systems: As per HPI, rest all negative.   Past Medical History:  Diagnosis Date   Anxiety    Arthritis    Asthma    Bilateral lower extremity edema    COPD (chronic obstructive pulmonary disease) (HCC)    Cough    CHRONIC   Diabetes mellitus 2008   Edema    LEGS/FEET   GERD (gastroesophageal reflux disease)    Hyperlipidemia    Hypertension    Shortness of breath dyspnea    DOE   Sleep apnea    [facility], Dr. Humphrey Rolls   Stroke [facility]) 02/2010   headache, left arm numbness   Wheezing     Past Surgical History:  Procedure Laterality Date   ABDOMINAL HYSTERECTOMY     ANTERIOR FUSION CERVICAL SPINE     CARPAL TUNNEL RELEASE     Bilateral   CATARACT EXTRACTION W/PHACO Left 03/12/2016   Procedure: CATARACT EXTRACTION PHACO AND INTRAOCULAR LENS PLACEMENT ([place]);  Surgeon: Estill Cotta, MD;  Location: [facility] ORS;  Service: Ophthalmology;  Laterality: Left;  [place] 01:12 AP%  23.4 CDE 29.83 fluid pack lot # [id number]   CATARACT EXTRACTION W/PHACO Right 04/16/2016   Procedure: CATARACT EXTRACTION PHACO AND INTRAOCULAR LENS PLACEMENT (IOC);  Surgeon: Estill Cotta, MD;  Location: [facility] ORS;  Service: Ophthalmology;  Laterality: Right;  [place] 01:17 AP% 22.5 CDE 35.08 Fluid pack lot # [id number]   CESAREAN SECTION     COLONOSCOPY WITH PROPOFOL N/A 08/12/2018   Procedure: COLONOSCOPY WITH PROPOFOL;  Surgeon: Toledo, Benay Pike, MD;  Location: [facility] ENDOSCOPY;  Service: Gastroenterology;  Laterality: N/A;   ELBOW SURGERY     Tendonitis   ENDARTERECTOMY FEMORAL Left 04/11/2022   Procedure: ENDARTERECTOMY FEMORAL;  Surgeon: Algernon Huxley, MD;  Location: [facility] ORS;  Service: Vascular;  Laterality: Left;   ESOPHAGOGASTRODUODENOSCOPY (EGD) WITH PROPOFOL N/A 08/12/2018   Procedure: ESOPHAGOGASTRODUODENOSCOPY (EGD) WITH PROPOFOL;  Surgeon: Toledo, Benay Pike, MD;  Location: [facility] ENDOSCOPY;  Service: Gastroenterology;  Laterality: N/A;   INCONTINENCE SURGERY     INSERTION OF ILIAC STENT Left 04/11/2022   Procedure: INSERTION OF ILIAC STENT;  Surgeon: Algernon Huxley, MD;  Location: [facility] ORS;  Service: Vascular;  Laterality: Left;   LOWER EXTREMITY ANGIOGRAPHY Left 06/23/2021   Procedure: Lower Extremity Angiography;  Surgeon: Katha Cabal, MD;  Location: [place] [facility];  Service: Cardiovascular;  Laterality: Left;   LOWER EXTREMITY ANGIOGRAPHY Left 04/05/2022   Procedure: Lower Extremity Angiography;  Surgeon: Algernon Huxley, MD;  Location: [place] [facility];  Service: Cardiovascular;  Laterality: Left;   TONSILLECTOMY       reports that she has been smoking cigarettes. She has been smoking an average of 1 pack per day. She has never used smokeless tobacco. She reports current alcohol use of about 7.0 standard drinks of alcohol per week. She reports that she does not use drugs.  Allergies  Allergen Reactions   Lisinopril Other (See Comments)    Tongue burning sensation    Plavix [Clopidogrel] Other (See Comments)    Pt denies allergy.    Family History  Problem Relation Age of Onset   CAD Mother    Hypertension Mother    Arthritis Mother    Diabetes Father    CAD Father     Prior to Admission medications   Medication Sig Start Date End Date Taking? Authorizing Provider  albuterol (VENTOLIN HFA) 108 (90 Base) MCG/ACT inhaler Inhale 2 puffs into the lungs as needed.   Yes [provider]  amitriptyline (ELAVIL) 150 MG tablet Take 150 mg by mouth daily. Reported on 12/20/2015   Yes [provider]  aspirin EC 81 MG EC tablet Take 1 tablet (81 mg total) by mouth daily. 11/08/15  Yes Gladstone Lighter, MD  atorvastatin (LIPITOR) 80 MG tablet Take 80 mg by mouth daily. 06/13/21  Yes [provider]  busPIRone (BUSPAR) 5 MG tablet Take 5 mg by mouth 2 (two) times daily. 04/18/21  Yes [provider]  Calcium Carb-Cholecalciferol 600-400 MG-UNIT TABS Take 1 tablet by mouth daily.   Yes [provider]  gabapentin (NEURONTIN) 600 MG tablet Take 1,200 mg by mouth 2 (two) times daily. 04/18/21  Yes [provider]  metFORMIN (GLUCOPHAGE) 1000 MG tablet Take 1,000 mg by mouth 2 (two) times daily. 06/13/21  Yes [provider]  metoprolol tartrate (LOPRESSOR) 25 MG tablet Take 0.5 tablets (12.5 mg total) by mouth 2 (two) times daily. 04/13/22  Yes Emeterio Reeve, DO  montelukast (SINGULAIR) 10 MG tablet Take 10 mg by mouth daily. 06/13/21  Yes [provider]  Multiple Vitamin (MULTIVITAMIN WITH MINERALS) TABS tablet Take 1 tablet by mouth daily. 06/29/21  Yes Fritzi Mandes, MD  pantoprazole (PROTONIX) 40 MG tablet Take 40 mg by mouth daily. 01/30/21  Yes [provider]  senna-docusate (SENOKOT-S) 8.6-50 MG tablet Take 2 tablets by mouth at bedtime as needed for mild constipation. 04/13/22  Yes Emeterio Reeve, DO  sertraline (ZOLOFT) 50 MG tablet Take 3 tablets (150 mg total) by mouth daily.  06/28/21  Yes Fritzi Mandes, MD  tiZANidine (ZANAFLEX) 4 MG tablet Take 4 mg by mouth 2 (two) times daily. 05/22/21  Yes [provider]  alendronate (FOSAMAX) 70 MG tablet Take 70 mg by mouth once a week. Take with a full glass of water on an empty stomach.    [provider]  clopidogrel (PLAVIX) 75 MG tablet Take 1 tablet (75 mg total) by mouth daily. Patient not taking: Reported on 02/26/2022 06/28/21   Fritzi Mandes, MD  nicotine (NICODERM CQ - DOSED IN MG/24 HOURS) 21 mg/24hr patch Place 1 patch (21 mg total) onto the skin daily. Patient not taking: Reported on 02/26/2022 06/29/21   Fritzi Mandes, MD  oxyCODONE-acetaminophen (PERCOCET/ROXICET) 5-325 MG tablet Take 1-2 tablets by mouth every 4 (four) hours as needed for moderate pain. 04/16/22   Kris Hartmann, NP  vitamin B-12 (CYANOCOBALAMIN) 1000 MCG tablet Take 1,000 mcg by mouth daily. Reported on 12/20/2015  [provider]    Physical Exam: Constitutional: Moderately built and nourished. Vitals:   04/19/22 1945 04/19/22 2000 04/19/22 2030 04/19/22 2041  BP:  111/87 (!) 121/59   Pulse: 80 99 74   Resp: 20 (!) 21    Temp:    98.2 F (36.8 C)  TempSrc:    Oral  SpO2: 98% 92%    Weight:      Height:       Eyes: Anicteric no pallor. ENMT: No discharge from the ears eyes nose and mouth. Neck: No mass felt.  No neck rigidity. Respiratory: No rhonchi or crepitations. Cardiovascular: S1-S2 heard. Abdomen: Soft nontender bowel sound present. Musculoskeletal: Left leg has a wound VAC.  Pulses felt. Skin: Chronic skin changes. Neurologic: Alert awake oriented to name and place moving all extremities. Psychiatric: Appears normal.  Normal affect.   Labs on Admission: I have personally reviewed following labs and imaging studies  CBC: Recent Labs  Lab 04/13/22 0337 04/19/22 1500  WBC 9.6 13.7*  NEUTROABS  --  11.3*  HGB 10.0* 9.6*  HCT 30.9* 30.1*  MCV 89.3 89.6  PLT 196 222   Basic Metabolic  Panel: Recent Labs  Lab 04/13/22 0337 04/19/22 1500  NA 138 136  K 3.7 4.1  CL 108 105  CO2 26 23  GLUCOSE 172* 161*  BUN 8 22  CREATININE 0.65 1.03*  CALCIUM 8.8* 8.8*  MG 1.8  --   PHOS 3.6  --    GFR: Estimated Creatinine Clearance: 43.4 mL/min (A) (by C-G formula based on SCr of 1.03 mg/dL (H)). Liver Function Tests: Recent Labs  Lab 04/19/22 1500  AST 17  ALT 16  ALKPHOS 93  BILITOT 0.5  PROT 7.3  ALBUMIN 3.3*   No results for input(s): "LIPASE", "AMYLASE" in the last 168 hours. No results for input(s): "AMMONIA" in the last 168 hours. Coagulation Profile: No results for input(s): "INR", "PROTIME" in the last 168 hours. Cardiac Enzymes: No results for input(s): "CKTOTAL", "CKMB", "CKMBINDEX", "TROPONINI" in the last 168 hours. BNP (last 3 results) No results for input(s): "PROBNP" in the last 8760 hours. HbA1C: No results for input(s): "HGBA1C" in the last 72 hours. CBG: Recent Labs  Lab 04/13/22 0730  GLUCAP 168*   Lipid Profile: No results for input(s): "CHOL", "HDL", "LDLCALC", "TRIG", "CHOLHDL", "LDLDIRECT" in the last 72 hours. Thyroid Function Tests: No results for input(s): "TSH", "T4TOTAL", "FREET4", "T3FREE", "THYROIDAB" in the last 72 hours. Anemia Panel: No results for input(s): "VITAMINB12", "FOLATE", "FERRITIN", "TIBC", "IRON", "RETICCTPCT" in the last 72 hours. Urine analysis:    Component Value Date/Time   COLORURINE YELLOW (A) 04/19/2022 1849   APPEARANCEUR CLEAR (A) 04/19/2022 1849   LABSPEC 1.011 04/19/2022 1849   PHURINE 5.0 04/19/2022 1849   GLUCOSEU NEGATIVE 04/19/2022 1849   HGBUR NEGATIVE 04/19/2022 1849   BILIRUBINUR NEGATIVE 04/19/2022 1849   KETONESUR NEGATIVE 04/19/2022 1849   PROTEINUR NEGATIVE 04/19/2022 1849   NITRITE NEGATIVE 04/19/2022 1849   LEUKOCYTESUR NEGATIVE 04/19/2022 1849   Sepsis Labs: '@LABRCNTIP'$ (procalcitonin:4,lacticidven:4) ) Recent Results (from the past 240 hour(s))  MRSA Next Gen by PCR, Nasal      Status: None   Collection Time: 04/11/22  2:37 PM   Specimen: Nasal Mucosa; Nasal Swab  Result Value Ref Range Status   MRSA by PCR Next Gen NOT DETECTED NOT DETECTED Final    Comment: (NOTE) The GeneXpert MRSA Assay (FDA approved for NASAL specimens only), is one component of a comprehensive MRSA colonization surveillance program.  It is not intended to diagnose MRSA infection nor to guide or monitor treatment for MRSA infections. Test performance is not FDA approved in patients less than 14 years old. Performed at [facility], [address] [place], [place] [address]      Radiological Exams on Admission: DG Chest Portable 1 View  Result Date: 04/19/2022 CLINICAL DATA:  Shortness of breath.  Fall. EXAM: PORTABLE CHEST 1 VIEW COMPARISON:  Chest CT 04/04/2022. Prior chest radiographs 04/03/2022 and earlier. FINDINGS: Shallow inspiration radiograph. Heart size within normal limits. Aortic atherosclerosis. Ill-defined opacity within the bilateral lung bases, favored to reflect atelectasis. No evidence of pleural effusion or pneumothorax. No acute bony abnormality identified. IMPRESSION: Shallow inspiration radiograph. Ill-defined opacity within the bilateral lung bases. This is favored to reflect atelectasis. However, aspiration/pneumonia cannot be excluded and clinical correlation is recommended. Aortic Atherosclerosis (ICD10-I70.0). Electronically Signed   By: Kellie Simmering D.O.   On: 04/19/2022 15:48   CT ABDOMEN PELVIS WO CONTRAST  Result Date: 04/19/2022 CLINICAL DATA:  Abdominal pain.  Multiple falls EXAM: CT ABDOMEN AND PELVIS WITHOUT CONTRAST TECHNIQUE: Multidetector CT imaging of the abdomen and pelvis was performed following the standard protocol without IV contrast. RADIATION DOSE REDUCTION: This exam was performed according to the departmental dose-optimization program which includes automated exposure control, adjustment of the mA and/or kV according to patient size  and/or use of iterative reconstruction technique. COMPARISON:  None Available. FINDINGS: Lower chest: Lung bases are clear. Hepatobiliary: No focal hepatic lesion. No biliary duct dilatation. Common bile duct is normal. Pancreas: Pancreas is normal. No ductal dilatation. No pancreatic inflammation. Spleen: Normal spleen Adrenals/urinary tract: Adrenal glands and kidneys are normal. The ureters and bladder normal. Stomach/Bowel: Stomach, small bowel, appendix, and cecum are normal. The colon and rectosigmoid colon are normal. Vascular/Lymphatic: Extensive vascular calcification of the aorta. Endoluminal stent in place. No lymphadenopathy. Reproductive: Post hysterectomy.  Adnexa unremarkable Other: No intraperitoneal free fluid. Musculoskeletal: No fracture in the pelvis or spine. Insert patient sclerosis of the RIGHT femoral head. No aggressive osseous lesion. IMPRESSION: 1. no acute findings abdomen pelvis. 2. No evidence of trauma. 3. Potential avascular necrosis of the RIGHT femoral head. 4.  Aortic Atherosclerosis (ICD10-I70.0). Electronically Signed   By: Suzy Bouchard M.D.   On: 04/19/2022 15:41   CT HEAD WO CONTRAST (5MM)  Result Date: 04/19/2022 CLINICAL DATA:  Neuro deficit, acute, stroke suspected EXAM: CT HEAD WITHOUT CONTRAST TECHNIQUE: Contiguous axial images were obtained from the base of the skull through the vertex without intravenous contrast. RADIATION DOSE REDUCTION: This exam was performed according to the departmental dose-optimization program which includes automated exposure control, adjustment of the mA and/or kV according to patient size and/or use of iterative reconstruction technique. COMPARISON:  04/04/2022 FINDINGS: Brain: There is no acute intracranial hemorrhage, mass effect, or edema. Gray-white differentiation is preserved. Small chronic infarcts of the right thalamus and left corona radiata. There is no extra-axial fluid collection. Ventricles and sulci are stable in size and  configuration. Vascular: There is atherosclerotic calcification at the skull base. Skull: Calvarium is unremarkable. Sinuses/Orbits: No acute finding. Other: None. IMPRESSION: No acute intracranial abnormality. Stable chronic/nonemergent findings detailed above. Electronically Signed   By: Macy Mis M.D.   On: 04/19/2022 15:35    EKG: Independently reviewed.  Sinus tachycardia.  Assessment/Plan Principal Problem:   Weakness Active Problems:   Hypotension   Type 2 diabetes mellitus with hyperlipidemia (HCC)   OSA (obstructive sleep apnea)   COPD (chronic obstructive pulmonary disease) (  [place])    Generalized weakness -at presentation patient was hypotensive mildly elevated creatinine likely could be from dehydration.  Gently hydrate get physical therapy consult.  Follow metabolic panel. Leukocytosis with chest x-ray showing nonspecific findings for which patient has been placed on empiric antibiotics since patient at presentation was hypotensive concerning for infection. Recent stroke CT head unremarkable.  Continue antiplatelet agents and statins.  Holding beta-blockers due to hypotension. COPD not actively wheezing. Peripheral arterial disease status post left lower extremity stent placement on aspirin Plavix and statins.  On wound VAC. Sleep apnea on oxygen at bedtime.   DVT prophylaxis: Lovenox. Code Status: Full code. Family Communication: Patient's family at the bedside. Disposition Plan: Home. Consults called: Physical therapy. Admission status: Observation.   Rise Patience MD [facility] Pager [phone].  If 7PM-7AM, please contact night-coverage [email or web address] Password [facility]  04/19/2022, 9:06 PM

## 2022-04-19 NOTE — ED Notes (Signed)
External urinary cath placed. Pt. Encouraged to urinate, states she will, understands need for urine specimen.

## 2022-04-19 NOTE — ED Notes (Signed)
Pt. Reminded again that urine specimen is needed. Pt. Verbalizes understanding, states she will urinate.

## 2022-04-19 NOTE — ED Provider Notes (Signed)
[facility] Provider Note    Event Date/Time   First MD Initiated Contact with Patient 04/19/22 1450     (approximate)   History   Weakness (C/o increasing weakness x1 week post hospitalization for stroke. Pt. Was d/c'd last Thursday. Multiple falls reported by pt's daughter, pt. States PT not going well. Daughter reports pt's has hx of dementia and stroke affected her memory/cognitive ability.  Wound vac to left groin. Pt. Denies complaints currently.)   HPI  Autumn Arnold is a 71 y.o. female with past medical history of CVA OSA diabetes hypertension peripheral vascular disease status post aortic iliac and SMA stent and recent left femoral endarterectomy who presents after multiple falls at home.  Patient was recently hospitalized for CVA and left critical limb ischemia where she will underwent left femoral endarterectomy.  Presents today because of multiple falls.  Patient tells me that when she got up today out of bed she fell several times.  Patient has some difficulty explaining exactly the nature of the falls or lateral part of the body she fell onto.  Says she does not hit her head.  Patient denies complaints currently.  Denies chest pain dyspnea cough fevers chills.  Denies abdominal pain nausea vomiting.  He has a wound VAC in place on the left lower extremity at the site of her left femoral endarterectomy.   Spoke with patient's daughter Autumn Arnold who notes that since the patient left the hospital she has been more confused and weak.  She sits in a recliner and cannot really get up from there and multiple times will slide out of the recliner and they have had to call EMS several times to get her up.  Patient has had incidences where she does not recognize family members which is unusual for her.  They feel like the recent hospitalization and stroke is affecting her cognitive ability.  Past Medical History:  Diagnosis Date   Anxiety    Arthritis    Asthma     Bilateral lower extremity edema    COPD (chronic obstructive pulmonary disease) (HCC)    Cough    CHRONIC   Diabetes mellitus 2008   Edema    LEGS/FEET   GERD (gastroesophageal reflux disease)    Hyperlipidemia    Hypertension    Shortness of breath dyspnea    DOE   Sleep apnea    CPAP at [facility], Dr. Humphrey Rolls   Stroke [facility]) 02/2010   headache, left arm numbness   Wheezing     Patient Active Problem List   Diagnosis Date Noted   Carotid artery occlusion with infarction ([place]) 04/06/2022   Hypomagnesemia 04/05/2022   Iron deficiency anemia 04/04/2022   Acute CVA (cerebrovascular accident) ([place]) 04/04/2022   COPD (chronic obstructive pulmonary disease) ([place])    Hypotension    Gastroesophageal reflux disease without esophagitis 08/10/2021   Carotid stenosis 07/29/2021   Atherosclerosis of native arteries of extremity with intermittent claudication ([place]) 07/17/2021   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[place]) 07/17/2021   Hyperlipidemia 07/17/2021   Atherosclerosis of arteries 07/07/2021   Acute blood loss anemia    Retroperitoneal bleed    Drop in hemoglobin    Visual disturbance    Sepsis ([place]) 06/23/2021   SIRS (systemic inflammatory response syndrome) (HCC)    Rectal bleeding    Tobacco abuse    Anxiety and depression 06/22/2021   Hemiparesis affecting right side as late effect of stroke ([place]) 06/22/2021   OSA (obstructive sleep apnea)  06/22/2021   Type 2 diabetes mellitus with hyperlipidemia ([place]) 06/22/2021   Syncope 06/22/2021   Critical limb ischemia of left lower extremity ([place]) 06/22/2021   Hyperglycemia due to type 2 diabetes mellitus ([place]) 06/22/2021   Lactic acidosis 06/22/2021   Vitamin D deficiency 07/01/2018   Osteopenia of left hip 06/27/2018   DNR (do not resuscitate) 06/23/2018   Encounter for general adult medical examination without abnormal findings 06/23/2018   Other chronic pain 06/23/2018   Tobacco dependence 10/18/2017     Physical Exam  Triage Vital  Signs: ED Triage Vitals  Enc Vitals Group     BP 04/19/22 1454 (!) 73/57     Pulse Rate 04/19/22 1454 (!) 104     Resp 04/19/22 1454 (!) 24     Temp 04/19/22 1454 98.4 F (36.9 C)     Temp Source 04/19/22 1454 Oral     SpO2 04/19/22 1454 97 %     Weight 04/19/22 1455 143 lb (64.9 kg)     Height 04/19/22 1455 5' (1.524 m)     Head Circumference --      Peak Flow --      Pain Score 04/19/22 1455 0     Pain Loc --      Pain Edu? --      Excl. in [place]? --     Most recent vital signs: Vitals:   04/19/22 1815 04/19/22 1830  BP:    Pulse: 99 (!) 179  Resp: 20 (!) 25  Temp:    SpO2: 100% 100%     General: Awake, no distress.  CV:  Good peripheral perfusion.  Resp:  Normal effort. No increased wob Abd:  No distention. Mild ttp LLQ, no masses or bruising Neuro:             Awake, Alert, Oriented x 3  Other:  Left femoral wound VAC in place Biphasic dopplerable DP and PT pulses bilaterally   ED Results / Procedures / Treatments  Labs (all labs ordered are listed, but only abnormal results are displayed) Labs Reviewed  COMPREHENSIVE METABOLIC PANEL - Abnormal; Notable for the following components:      Result Value   Glucose, Bld 161 (*)    Creatinine, Ser 1.03 (*)    Calcium 8.8 (*)    Albumin 3.3 (*)    GFR, Estimated 59 (*)    All other components within normal limits  CBC WITH DIFFERENTIAL/PLATELET - Abnormal; Notable for the following components:   WBC 13.7 (*)    RBC 3.36 (*)    Hemoglobin 9.6 (*)    HCT 30.1 (*)    Neutro Abs 11.3 (*)    Abs Immature Granulocytes 0.10 (*)    All other components within normal limits  URINALYSIS, ROUTINE W REFLEX MICROSCOPIC - Abnormal; Notable for the following components:   Color, Urine YELLOW (*)    APPearance CLEAR (*)    All other components within normal limits  CULTURE, BLOOD (ROUTINE X 2)  CULTURE, BLOOD (ROUTINE X 2)  LACTIC ACID, PLASMA  PROCALCITONIN  TROPONIN I (HIGH SENSITIVITY)     EKG  EKG  interpretation performed by myself: NSR, nml axis, nml intervals, no acute ischemic changes- low voltage, prwp   RADIOLOGY I reviewed and interpreted the chest x-ray there is some linear opacities bibasilar   PROCEDURES:  Critical Care performed: No  .1-3 Lead EKG Interpretation  Performed by: Rada Hay, MD Authorized by: Rada Hay, MD  Interpretation: abnormal     ECG rate assessment: tachycardic     Rhythm: sinus tachycardia     Ectopy: none     Conduction: normal     The patient is on the cardiac monitor to evaluate for evidence of arrhythmia and/or significant heart rate changes.   MEDICATIONS ORDERED IN ED: Medications  cefTRIAXone (ROCEPHIN) 1 g in sodium chloride 0.9 % 100 mL IVPB (has no administration in time range)  azithromycin (ZITHROMAX) 500 mg in sodium chloride 0.9 % 250 mL IVPB (has no administration in time range)  lactated ringers bolus 1,000 mL (has no administration in time range)  lactated ringers bolus 1,000 mL (0 mLs Intravenous Stopped 04/19/22 1745)     IMPRESSION / MDM / ASSESSMENT AND PLAN / ED COURSE  I reviewed the triage vital signs and the nursing notes.                              Patient's presentation is most consistent with acute presentation with potential threat to life or bodily function.  Differential diagnosis includes, but is not limited to, sepsis, dehydration, deconditioning related to recent hospitalization  Patient is a 71 year old female past medical history of recent CVA peripheral vascular disease with recent left femoral endarterectomy who presents with weakness.  Patient tells me her family called EMS because she has had multiple falls.  Says she fell several times today but patient has difficulty explaining the nature of the falls and any preceding symptoms what part of her body she fell onto or if she hit her head.  She is hypotensive on arrival tachycardic and mildly tachypneic.  She is alert and  oriented but is somewhat slow to respond neurologic exam is nonfocal.  She has some mild tenderness in the left lower quadrant there is a wound VAC in place on the left femoral region.  Her extremities are cool bilaterally but symmetric she has no increasing pain in her extremities.  Check basic labs to rule out infection anemia etc.  Patient's labs are notable for leukocytosis to 13.7 hemoglobin is 9.6 which is not far off her baseline.  He has a mild AKI creatinine 1.03 from baseline around 0.6 troponins negative.  Chest x-ray looks like there is mention of bibasilar atelectasis versus pneumonia.  Patient has no symptoms such as cough or shortness of breath however with leukocytosis and hypotension will cover with antibiotics..  Lactate negative.  Of note patient was in and out cath and had over a liter of urine in her bladder.  On reassessment she has good strength with plantarflexion dorsiflexion lower extremities she is slightly weaker on the left compared to right but there are no significant deficits and her sensory exam is normal.  She does have some lower back pain denies bowel or bladder incontinence has not had issues going to the bathroom at home.  We will suspicion for cauda equina syndrome but this will need to be monitored.          FINAL CLINICAL IMPRESSION(S) / ED DIAGNOSES   Final diagnoses:  Weakness     Rx / DC Orders   ED Discharge Orders     None        Note:  This document was prepared using Dragon voice recognition software and may include unintentional dictation errors.   Rada Hay, MD 04/19/22 1924

## 2022-04-19 NOTE — ED Notes (Signed)
ED Provider at bedside. 

## 2022-04-20 DIAGNOSIS — F0394 Unspecified dementia, unspecified severity, with anxiety: Secondary | ICD-10-CM | POA: Diagnosis present

## 2022-04-20 DIAGNOSIS — Z95820 Peripheral vascular angioplasty status with implants and grafts: Secondary | ICD-10-CM | POA: Diagnosis not present

## 2022-04-20 DIAGNOSIS — N179 Acute kidney failure, unspecified: Secondary | ICD-10-CM | POA: Diagnosis present

## 2022-04-20 DIAGNOSIS — J440 Chronic obstructive pulmonary disease with acute lower respiratory infection: Secondary | ICD-10-CM | POA: Diagnosis present

## 2022-04-20 DIAGNOSIS — Z7982 Long term (current) use of aspirin: Secondary | ICD-10-CM | POA: Diagnosis not present

## 2022-04-20 DIAGNOSIS — D72829 Elevated white blood cell count, unspecified: Secondary | ICD-10-CM

## 2022-04-20 DIAGNOSIS — R6520 Severe sepsis without septic shock: Secondary | ICD-10-CM | POA: Diagnosis present

## 2022-04-20 DIAGNOSIS — E1169 Type 2 diabetes mellitus with other specified complication: Secondary | ICD-10-CM

## 2022-04-20 DIAGNOSIS — M5126 Other intervertebral disc displacement, lumbar region: Secondary | ICD-10-CM | POA: Diagnosis present

## 2022-04-20 DIAGNOSIS — A419 Sepsis, unspecified organism: Secondary | ICD-10-CM | POA: Diagnosis present

## 2022-04-20 DIAGNOSIS — E785 Hyperlipidemia, unspecified: Secondary | ICD-10-CM

## 2022-04-20 DIAGNOSIS — K219 Gastro-esophageal reflux disease without esophagitis: Secondary | ICD-10-CM | POA: Diagnosis present

## 2022-04-20 DIAGNOSIS — I959 Hypotension, unspecified: Secondary | ICD-10-CM | POA: Diagnosis present

## 2022-04-20 DIAGNOSIS — I70202 Unspecified atherosclerosis of native arteries of extremities, left leg: Secondary | ICD-10-CM | POA: Diagnosis present

## 2022-04-20 DIAGNOSIS — Z8249 Family history of ischemic heart disease and other diseases of the circulatory system: Secondary | ICD-10-CM | POA: Diagnosis not present

## 2022-04-20 DIAGNOSIS — M1990 Unspecified osteoarthritis, unspecified site: Secondary | ICD-10-CM | POA: Diagnosis present

## 2022-04-20 DIAGNOSIS — R296 Repeated falls: Secondary | ICD-10-CM | POA: Diagnosis present

## 2022-04-20 DIAGNOSIS — R531 Weakness: Secondary | ICD-10-CM | POA: Diagnosis present

## 2022-04-20 DIAGNOSIS — E1151 Type 2 diabetes mellitus with diabetic peripheral angiopathy without gangrene: Secondary | ICD-10-CM | POA: Diagnosis present

## 2022-04-20 DIAGNOSIS — F0393 Unspecified dementia, unspecified severity, with mood disturbance: Secondary | ICD-10-CM | POA: Diagnosis present

## 2022-04-20 DIAGNOSIS — J189 Pneumonia, unspecified organism: Secondary | ICD-10-CM | POA: Diagnosis present

## 2022-04-20 DIAGNOSIS — M545 Low back pain, unspecified: Secondary | ICD-10-CM

## 2022-04-20 DIAGNOSIS — G4733 Obstructive sleep apnea (adult) (pediatric): Secondary | ICD-10-CM | POA: Diagnosis present

## 2022-04-20 DIAGNOSIS — I708 Atherosclerosis of other arteries: Secondary | ICD-10-CM | POA: Diagnosis present

## 2022-04-20 DIAGNOSIS — I739 Peripheral vascular disease, unspecified: Secondary | ICD-10-CM

## 2022-04-20 DIAGNOSIS — I10 Essential (primary) hypertension: Secondary | ICD-10-CM | POA: Diagnosis present

## 2022-04-20 DIAGNOSIS — I69311 Memory deficit following cerebral infarction: Secondary | ICD-10-CM | POA: Diagnosis not present

## 2022-04-20 DIAGNOSIS — Z8261 Family history of arthritis: Secondary | ICD-10-CM | POA: Diagnosis not present

## 2022-04-20 LAB — COMPREHENSIVE METABOLIC PANEL
ALT: 14 U/L (ref 0–44)
AST: 17 U/L (ref 15–41)
Albumin: 3.1 g/dL — ABNORMAL LOW (ref 3.5–5.0)
Alkaline Phosphatase: 91 U/L (ref 38–126)
Anion gap: 8 (ref 5–15)
BUN: 13 mg/dL (ref 8–23)
CO2: 25 mmol/L (ref 22–32)
Calcium: 8.8 mg/dL — ABNORMAL LOW (ref 8.9–10.3)
Chloride: 105 mmol/L (ref 98–111)
Creatinine, Ser: 0.76 mg/dL (ref 0.44–1.00)
GFR, Estimated: >60 mL/min (ref >60)
Glucose, Bld: 129 mg/dL — ABNORMAL HIGH (ref 70–99)
Potassium: 3.5 mmol/L (ref 3.5–5.1)
Sodium: 138 mmol/L (ref 135–145)
Total Bilirubin: 0.5 mg/dL (ref 0.3–1.2)
Total Protein: 6.9 g/dL (ref 6.5–8.1)

## 2022-04-20 LAB — GLUCOSE, CAPILLARY
Glucose-Capillary: 120 mg/dL — ABNORMAL HIGH (ref 70–99)
Glucose-Capillary: 150 mg/dL — ABNORMAL HIGH (ref 70–99)
Glucose-Capillary: 147 mg/dL — ABNORMAL HIGH (ref 70–99)
Glucose-Capillary: 187 mg/dL — ABNORMAL HIGH (ref 70–99)

## 2022-04-20 MED ORDER — INSULIN ASPART 100 UNIT/ML IJ SOLN
3.0000 [IU] | Freq: Three times a day (TID) | INTRAMUSCULAR | Status: DC
  Administered 2022-04-20 – 2022-04-22 (×5): 3 [IU] via SUBCUTANEOUS
  Filled 2022-04-20 (×5): qty 1

## 2022-04-20 MED ORDER — METOPROLOL TARTRATE 25 MG PO TABS
12.5000 mg | ORAL_TABLET | Freq: Two times a day (BID) | ORAL | Status: DC
  Administered 2022-04-20 – 2022-04-22 (×5): 12.5 mg via ORAL
  Filled 2022-04-20 (×5): qty 1

## 2022-04-20 MED ORDER — INSULIN ASPART 100 UNIT/ML IJ SOLN: 0.0000 [IU] | Freq: Every day | INTRAMUSCULAR | Status: DC

## 2022-04-20 MED ORDER — OXYCODONE-ACETAMINOPHEN 5-325 MG PO TABS
1.0000 | ORAL_TABLET | ORAL | Status: DC | PRN
  Administered 2022-04-20 – 2022-04-21 (×2): 2 via ORAL
  Filled 2022-04-20 (×2): qty 2

## 2022-04-20 MED ORDER — LACTATED RINGERS IV SOLN: INTRAVENOUS | Status: AC

## 2022-04-20 MED ORDER — INSULIN ASPART 100 UNIT/ML IJ SOLN
0.0000 [IU] | Freq: Three times a day (TID) | INTRAMUSCULAR | Status: DC
  Administered 2022-04-20 (×2): 1 [IU] via SUBCUTANEOUS
  Administered 2022-04-21: 8 [IU] via SUBCUTANEOUS
  Administered 2022-04-22: 1 [IU] via SUBCUTANEOUS
  Filled 2022-04-20 (×4): qty 1

## 2022-04-20 MED ORDER — POLYETHYLENE GLYCOL 3350 17 G PO PACK
17.0000 g | PACK | Freq: Every day | ORAL | Status: AC
  Filled 2022-04-20: qty 1

## 2022-04-20 NOTE — Evaluation (Signed)
Physical Therapy Evaluation Patient Details Name: Autumn Arnold MRN: [id number] DOB: [birth year] Today's Date: 04/20/2022  History of Present Illness  Pt is a 71 yo female that presetned to the ED due to increasing weakness, falls, and confusion. Recently admitted for CVA and LLE endarterectomy due to critical limb ischemia. PMH of anxiety, astham, COPD, DM, cough, HLD, HTN, sleep apnea, CVA, cervical fusion.   Clinical Impression  Patient alert, agreeable to PT, denied pain and oriented x4. The patient reported that she is independent ADLs, husband performs IADLs, and that she recently started using a RW. Stated she has had 10 falls in the last 6 months.  The patient was able to perform supine <> sit modI. Sit <> stand with RW and supervision, cued for hand placement with good carryover. She demonstrated mild LLE weakness compared to R in hip flexion, but overall functional. She was able to ambulate ~172f with RW and CGA-supervision. She did exhibit drifting to the L but able to correct with cueing and intermittently does correct without prompting.  Overall the patient demonstrated deficits (see "PT Problem List") that impede the patient's functional abilities, safety, and mobility and would benefit from skilled PT intervention. Recommendation at this time is HHPT with intermittent supervision/assistance.        Recommendations for follow up therapy are one component of a multi-disciplinary discharge planning process, led by the attending physician.  Recommendations may be updated based on patient status, additional functional criteria and insurance authorization.  Follow Up Recommendations Home health PT    Assistance Recommended at Discharge Intermittent Supervision/Assistance  Patient can return home with the following  A little help with walking and/or transfers;Help with stairs or ramp for entrance;Assistance with cooking/housework;Assist for transportation    Equipment Recommendations  Rolling walker (2 wheels)  Recommendations for Other Services       Functional Status Assessment Patient has had a recent decline in their functional status and demonstrates the ability to make significant improvements in function in a reasonable and predictable amount of time.     Precautions / Restrictions Precautions Precautions: Fall Restrictions Weight Bearing Restrictions: No LLE Weight Bearing: Weight bearing as tolerated      Mobility  Bed Mobility Overal bed mobility: Needs Assistance Bed Mobility: Supine to Sit     Supine to sit: Supervision, HOB elevated          Transfers Overall transfer level: Needs assistance Equipment used: Rolling walker (2 wheels) Transfers: Sit to/from Stand Sit to Stand: Supervision, Min guard           General transfer comment: patient instructed in safe hand placement with RW with good carryover    Ambulation/Gait Ambulation/Gait assistance: Min guard, Supervision Gait Distance (Feet): 170 Feet Assistive device: Rolling walker (2 wheels)         General Gait Details: some drifting noted to the L, pt also aware. able to address with cueing. no LOB noted  Stairs            Wheelchair Mobility    Modified Rankin (Stroke Patients Only)       Balance Overall balance assessment: Needs assistance Sitting-balance support: Feet supported Sitting balance-Leahy Scale: Good       Standing balance-Leahy Scale: Fair                               Pertinent Vitals/Pain Pain Assessment Pain Assessment: No/denies pain    Home Living Family/patient  expects to be discharged to:: Private residence Living Arrangements: Spouse/significant other Available Help at Discharge: Family;Friend(s);Available PRN/intermittently Type of Home: House Home Access: Stairs to enter Entrance Stairs-Rails: None (column) Entrance Stairs-Number of Steps: 2   Home Layout: One level Home Equipment: Rollator (4  wheels);Rolling Walker (2 wheels);Cane - single point Additional Comments: 10 falls in the last 6 months.    Prior Function Prior Level of Function : Independent/Modified Independent             Mobility Comments: Pt reports amb with RW for the last couple of months, does not often leave the home ADLs Comments: pt reports MOD I with ADL, IADLs via husbands, husband has been driving to grocery store, doing grocery shopping     Hand Dominance   Dominant Hand: Right    Extremity/Trunk Assessment   Upper Extremity Assessment Upper Extremity Assessment: Overall WFL for tasks assessed    Lower Extremity Assessment Lower Extremity Assessment: LLE deficits/detail;RLE deficits/detail RLE Deficits / Details: WFLs LLE Deficits / Details: decreased L hip flexion, pt reported some L sided weakness recently (s/p endarterectomy)       Communication   Communication: No difficulties  Cognition Arousal/Alertness: Awake/alert Behavior During Therapy: WFL for tasks assessed/performed Overall Cognitive Status: Within Functional Limits for tasks assessed                                 General Comments: pt oriented x4        General Comments      Exercises     Assessment/Plan    PT Assessment Patient needs continued PT services  PT Problem List Decreased strength;Decreased range of motion;Decreased activity tolerance;Decreased balance;Decreased mobility;Decreased coordination;Decreased safety awareness;Decreased knowledge of use of DME;Decreased knowledge of precautions       PT Treatment Interventions DME instruction;Gait training;Stair training;Functional mobility training;Therapeutic activities;Therapeutic exercise;Balance training;Neuromuscular re-education;Patient/family education    PT Goals (Current goals can be found in the Care Plan section)  Acute Rehab PT Goals Patient Stated Goal: to go home PT Goal Formulation: With patient Time For Goal Achievement:  05/04/22 Potential to Achieve Goals: Good    Frequency Min 2X/week     Co-evaluation               AM-PAC PT "6 Clicks" Mobility  Outcome Measure Help needed turning from your back to your side while in a flat bed without using bedrails?: None Help needed moving from lying on your back to sitting on the side of a flat bed without using bedrails?: None Help needed moving to and from a bed to a chair (including a wheelchair)?: A Little Help needed standing up from a chair using your arms (e.g., wheelchair or bedside chair)?: A Little Help needed to walk in hospital room?: A Little Help needed climbing 3-5 steps with a railing? : A Little 6 Click Score: 20    End of Session Equipment Utilized During Treatment: Gait belt Activity Tolerance: Patient tolerated treatment well Patient left: in bed;with call bell/phone within reach;with bed alarm set;with family/visitor present Nurse Communication: Mobility status PT Visit Diagnosis: Muscle weakness (generalized) (M62.81);Difficulty in walking, not elsewhere classified (R26.2);Unsteadiness on feet (R26.81);Other symptoms and signs involving the nervous system (R29.898)    Time: 1313-1330 PT Time Calculation (min) (ACUTE ONLY): 17 min   Charges:   PT Evaluation $PT Eval Low Complexity: 1 Low PT Treatments $Therapeutic Activity: 8-22 mins  Lieutenant Diego PT, DPT 2:53 PM,04/20/22

## 2022-04-20 NOTE — Progress Notes (Signed)
[facility] PROGRESS NOTE    Progress Note  Autumn Arnold  JAS:[id number] DOB: 21-Sep-1951 DOA: 04/19/2022 PCP: [name], MD     Brief Narrative:   Autumn Arnold is an 71 y.o. female past medical history of a recent stroke, critical limb ischemia status post stent placement to the left lower extremity, subsequently a wound VAC was placed, who comes in for increased weakness frequent falls and confusion.  CT head of the abdomen and pelvis were unremarkable, on arrival to the ED Foley was placed a liter of urine drained.  Chest x-ray showed nonspecific opacity with a leukocytosis was found to be hypotensive blood cultures were ordered started empirically on antibiotics and fluid resuscitated.   Assessment/Plan:   Generalized Weakness: Possibly due to hypotension in the setting of dehydration and ongoing medication use. She was started on aggressive fluid resuscitation. Physical therapy has been consulted. She is positive for liters still appears to be dehydrated.  Hypotension/leukocytosis: She had a mild leukocytosis and infiltrates she was started empirically on IV antibiotics Rocephin and azithromycin, as there was a concern for pneumonia. Blood cultures have been ordered. Tmax of 99.2. Culture data x2 has remained negative till date. Procalcitonin is low yield, lactic acid 1.3. UA shows no signs of infection.  Acute kidney injury: Likely prerenal azotemia, antihypertensive medications were held, blood pressure is improved. She has been fluid resuscitated. Creatinine has improved to baseline. Continue IV fluids for an additional 8 hours.  History of recent stroke: CT of the head unremarkable. Continue dual antiplatelet therapy and statins. Antihypertensive medications were held due to hypotension. Consult physical therapy.  Lower back pain: MRI of the lumbar spine showed no acute abnormalities.  Small disc protrusion L4-L5 L2-L3 and L3-L4 protrusion at left  on out to L3 could be affecting the nerve root.  At L4-L5 there is moderate facet hypertrophy with associated edema. These findings could be contributing to her left-sided lower extremity and generalized weakness. Consult physical therapy  Peripheral artery disease: Status post left lower extremity stent placement continue aspirin statin and Plavix. Wound VAC is in place we will consult the wound care team.  Diabetes mellitus type 2: Metformin was held. Started sliding scale insulin check hemoglobin A1c.  Sleep apnea: She uses oxygen at nighttime.  DVT prophylaxis: lovenox Family Communication:none Status is: Observation The patient will require care spanning > 2 midnights and should be moved to inpatient because: Acute kidney injury    Code Status:     Code Status Orders  (From admission, onward)           Start     Ordered   04/19/22 2105  Full code  Continuous        04/19/22 2105           Code Status History     Date Active Date Inactive Code Status Order ID Comments User Context   04/04/2022 0420 04/13/2022 1948 Full Code [id number]  Athena Masse, MD ED   06/26/2021 1103 06/28/2021 1836 DNR [id number]  Loletha Grayer, MD Inpatient   06/23/2021 0028 06/26/2021 1103 Full Code [id number]  Athena Masse, MD ED   11/07/2015 1046 11/08/2015 1716 Full Code [id number]  Loletha Grayer, MD ED         IV Access:   Peripheral IV   Procedures and diagnostic studies:   MR LUMBAR SPINE WO CONTRAST  Result Date: 04/20/2022 CLINICAL DATA:  Initial evaluation for acute low back pain, cauda equina syndrome suspected. EXAM:  MRI LUMBAR SPINE WITHOUT CONTRAST TECHNIQUE: Multiplanar, multisequence MR imaging of the lumbar spine was performed. No intravenous contrast was administered. COMPARISON:  None Available. FINDINGS: Segmentation: Standard. Lowest well-formed disc space labeled the L5-S1 level. Alignment: Trace facet mediated anterolisthesis of L4 on L5. Alignment  otherwise normal preservation of the normal lumbar lordosis. Vertebrae: Vertebral body height maintained without acute or chronic fracture. Bone marrow signal intensity within normal limits. No discrete or worrisome osseous lesions. Reactive marrow edema present about the right L4-5 facet due to facet arthritis. No other abnormal marrow edema. Conus medullaris and cauda equina: Conus extends to the L1-2 level. Conus and cauda equina appear normal. Paraspinal and other soft tissues: Unremarkable. Disc levels: L1-2:  Unremarkable. L2-3: Mild disc bulge. Superimposed small left foraminal to extraforaminal disc protrusion (series 8, image 17). Mild facet spurring. No spinal stenosis. Foramina remain patent. L3-4: Disc desiccation with mild disc bulge. Superimposed small left foraminal to extraforaminal disc protrusion (series 8, image 23). No spinal stenosis. Mild left L3 foraminal narrowing. Right neural foramen remains patent. L4-5: Trace anterolisthesis with mild disc bulge and disc desiccation. Superimposed small biforaminal disc protrusions closely approximate and/or contacts the exiting L4 nerve roots bilaterally. Moderate right worse than left facet hypertrophy with associated marrow edema and joint effusion on the right. No significant spinal stenosis. Mild bilateral L4 foraminal narrowing. L5-S1:  Negative interspace.  Mild facet hypertrophy.  No stenosis. IMPRESSION: 1. No acute abnormality within the lumbar spine. No significant spinal stenosis or evidence for cord compression. 2. Small biforaminal disc protrusions at L4-5, potentially affecting either of the exiting L4 nerve roots. 3. Small left foraminal to extraforaminal disc protrusions at L2-3 and L3-4, potentially affecting the exiting left L2 and L3 nerve roots respectively. 4. Moderate right worse than left facet hypertrophy at L4-5 with associated marrow edema. Finding could serve as a source for lower back pain. Electronically Signed   By: Jeannine Boga M.D.   On: 04/20/2022 00:36   DG Chest Portable 1 View  Result Date: 04/19/2022 CLINICAL DATA:  Shortness of breath.  Fall. EXAM: PORTABLE CHEST 1 VIEW COMPARISON:  Chest CT 04/04/2022. Prior chest radiographs 04/03/2022 and earlier. FINDINGS: Shallow inspiration radiograph. Heart size within normal limits. Aortic atherosclerosis. Ill-defined opacity within the bilateral lung bases, favored to reflect atelectasis. No evidence of pleural effusion or pneumothorax. No acute bony abnormality identified. IMPRESSION: Shallow inspiration radiograph. Ill-defined opacity within the bilateral lung bases. This is favored to reflect atelectasis. However, aspiration/pneumonia cannot be excluded and clinical correlation is recommended. Aortic Atherosclerosis (ICD10-I70.0). Electronically Signed   By: Kellie Simmering D.O.   On: 04/19/2022 15:48   CT ABDOMEN PELVIS WO CONTRAST  Result Date: 04/19/2022 CLINICAL DATA:  Abdominal pain.  Multiple falls EXAM: CT ABDOMEN AND PELVIS WITHOUT CONTRAST TECHNIQUE: Multidetector CT imaging of the abdomen and pelvis was performed following the standard protocol without IV contrast. RADIATION DOSE REDUCTION: This exam was performed according to the departmental dose-optimization program which includes automated exposure control, adjustment of the mA and/or kV according to patient size and/or use of iterative reconstruction technique. COMPARISON:  None Available. FINDINGS: Lower chest: Lung bases are clear. Hepatobiliary: No focal hepatic lesion. No biliary duct dilatation. Common bile duct is normal. Pancreas: Pancreas is normal. No ductal dilatation. No pancreatic inflammation. Spleen: Normal spleen Adrenals/urinary tract: Adrenal glands and kidneys are normal. The ureters and bladder normal. Stomach/Bowel: Stomach, small bowel, appendix, and cecum are normal. The colon and rectosigmoid colon are normal. Vascular/Lymphatic: Extensive vascular  calcification of the aorta.  Endoluminal stent in place. No lymphadenopathy. Reproductive: Post hysterectomy.  Adnexa unremarkable Other: No intraperitoneal free fluid. Musculoskeletal: No fracture in the pelvis or spine. Insert patient sclerosis of the RIGHT femoral head. No aggressive osseous lesion. IMPRESSION: 1. no acute findings abdomen pelvis. 2. No evidence of trauma. 3. Potential avascular necrosis of the RIGHT femoral head. 4.  Aortic Atherosclerosis (ICD10-I70.0). Electronically Signed   By: Suzy Bouchard M.D.   On: 04/19/2022 15:41   CT HEAD WO CONTRAST (5MM)  Result Date: 04/19/2022 CLINICAL DATA:  Neuro deficit, acute, stroke suspected EXAM: CT HEAD WITHOUT CONTRAST TECHNIQUE: Contiguous axial images were obtained from the base of the skull through the vertex without intravenous contrast. RADIATION DOSE REDUCTION: This exam was performed according to the departmental dose-optimization program which includes automated exposure control, adjustment of the mA and/or kV according to patient size and/or use of iterative reconstruction technique. COMPARISON:  04/04/2022 FINDINGS: Brain: There is no acute intracranial hemorrhage, mass effect, or edema. Gray-white differentiation is preserved. Small chronic infarcts of the right thalamus and left corona radiata. There is no extra-axial fluid collection. Ventricles and sulci are stable in size and configuration. Vascular: There is atherosclerotic calcification at the skull base. Skull: Calvarium is unremarkable. Sinuses/Orbits: No acute finding. Other: None. IMPRESSION: No acute intracranial abnormality. Stable chronic/nonemergent findings detailed above. Electronically Signed   By: Macy Mis M.D.   On: 04/19/2022 15:35     Medical Consultants:   None.   Subjective:    Autumn Arnold relates she feels slightly better than when she came in.  Objective:    Vitals:   04/19/22 2041 04/19/22 2108 04/20/22 0110 04/20/22 0434  BP:  (!) 130/54 (!) 103/43 (!) 142/61   Pulse:  92 89 (!) 104  Resp:  '15 16 16  '$ Temp: 98.2 F (36.8 C) 98.2 F (36.8 C) 98.7 F (37.1 C) 99.2 F (37.3 C)  TempSrc: Oral Oral    SpO2:  93% 96% 95%  Weight:  66.1 kg    Height:  5' (1.524 m)     SpO2: 95 %   Intake/Output Summary (Last 24 hours) at 04/20/2022 0740 Last data filed at 04/20/2022 0600 Gross per 24 hour  Intake 2157.79 ml  Output 1150 ml  Net 1007.79 ml   Filed Weights   04/19/22 1455 04/19/22 2108  Weight: 64.9 kg 66.1 kg    Exam: General exam: In no acute distress. Respiratory system: Good air movement and clear to auscultation. Cardiovascular system: S1 & S2 heard, RRR. No JVD. Gastrointestinal system: Abdomen is nondistended, soft and nontender.  Extremities: No pedal edema. Skin: No rashes, lesions or ulcers Psychiatry: Judgement and insight appear normal. Mood & affect appropriate.    Data Reviewed:    Labs: Basic Metabolic Panel: Recent Labs  Lab 04/19/22 1500  NA 136  K 4.1  CL 105  CO2 23  GLUCOSE 161*  BUN 22  CREATININE 1.03*  CALCIUM 8.8*   GFR Estimated Creatinine Clearance: 43.7 mL/min (A) (by C-G formula based on SCr of 1.03 mg/dL (H)). Liver Function Tests: Recent Labs  Lab 04/19/22 1500  AST 17  ALT 16  ALKPHOS 93  BILITOT 0.5  PROT 7.3  ALBUMIN 3.3*   No results for input(s): "LIPASE", "AMYLASE" in the last 168 hours. No results for input(s): "AMMONIA" in the last 168 hours. Coagulation profile No results for input(s): "INR", "PROTIME" in the last 168 hours. COVID-19 Labs  No results for input(s): "DDIMER", "  FERRITIN", "LDH", "CRP" in the last 72 hours.  Lab Results  Component Value Date   SARSCOV2NAA NEGATIVE 06/22/2021   [place]ray Not Detected 07/21/2019    CBC: Recent Labs  Lab 04/19/22 1500  WBC 13.7*  NEUTROABS 11.3*  HGB 9.6*  HCT 30.1*  MCV 89.6  PLT 304   Cardiac Enzymes: No results for input(s): "CKTOTAL", "CKMB", "CKMBINDEX", "TROPONINI" in the last 168 hours. BNP (last 3  results) No results for input(s): "PROBNP" in the last 8760 hours. CBG: No results for input(s): "GLUCAP" in the last 168 hours. D-Dimer: No results for input(s): "DDIMER" in the last 72 hours. Hgb A1c: No results for input(s): "HGBA1C" in the last 72 hours. Lipid Profile: No results for input(s): "CHOL", "HDL", "LDLCALC", "TRIG", "CHOLHDL", "LDLDIRECT" in the last 72 hours. Thyroid function studies: No results for input(s): "TSH", "T4TOTAL", "T3FREE", "THYROIDAB" in the last 72 hours.  Invalid input(s): "FREET3" Anemia work up: No results for input(s): "VITAMINB12", "FOLATE", "FERRITIN", "TIBC", "IRON", "RETICCTPCT" in the last 72 hours. Sepsis Labs: Recent Labs  Lab 04/19/22 1500  PROCALCITON 0.27  WBC 13.7*  LATICACIDVEN 1.3   Microbiology Recent Results (from the past 240 hour(s))  MRSA Next Gen by PCR, Nasal     Status: None   Collection Time: 04/11/22  2:37 PM   Specimen: Nasal Mucosa; Nasal Swab  Result Value Ref Range Status   MRSA by PCR Next Gen NOT DETECTED NOT DETECTED Final    Comment: (NOTE) The GeneXpert MRSA Assay (FDA approved for NASAL specimens only), is one component of a comprehensive MRSA colonization surveillance program. It is not intended to diagnose MRSA infection nor to guide or monitor treatment for MRSA infections. Test performance is not FDA approved in patients less than 77 years old. Performed at [facility], [address] [place], [place] [address]   Blood culture (routine x 2)     Status: None (Preliminary result)   Collection Time: 04/19/22  3:00 PM   Specimen: BLOOD  Result Value Ref Range Status   Specimen Description BLOOD LEFT ASSIST CONTROL  Final   Special Requests   Final    BOTTLES DRAWN AEROBIC AND ANAEROBIC Blood Culture adequate volume   Culture   Final    NO GROWTH < 12 HOURS Performed at [facility], [address] [place], [place] [address]    Report Status PENDING  Incomplete  Blood culture  (routine x 2)     Status: None (Preliminary result)   Collection Time: 04/19/22  9:43 PM   Specimen: BLOOD  Result Value Ref Range Status   Specimen Description BLOOD RIGHT HAND  Final   Special Requests   Final    BOTTLES DRAWN AEROBIC ONLY Blood Culture adequate volume   Culture   Final    NO GROWTH < 12 HOURS Performed at [facility], [address] [place], [place] [address]    Report Status PENDING  Incomplete     Medications:    amitriptyline  150 mg Oral Daily   aspirin EC  81 mg Oral Daily   atorvastatin  80 mg Oral Daily   busPIRone  5 mg Oral BID   clopidogrel  75 mg Oral Daily   gabapentin  600 mg Oral BID   insulin aspart  0-9 Units Subcutaneous TID WC   montelukast  10 mg Oral Daily   pantoprazole  40 mg Oral Daily   sertraline  150 mg Oral Daily   vitamin B-12  1,000  mcg Oral Daily   Continuous Infusions:  azithromycin     cefTRIAXone (ROCEPHIN)  IV     lactated ringers 100 mL/hr at 04/20/22 0333      LOS: 0 days   Charlynne Cousins  [facility]  04/20/2022, 7:40 AM

## 2022-04-20 NOTE — TOC Progression Note (Signed)
Transition of Care [facility]) - Progression Note    Patient Details  Name: Autumn Arnold MRN: [id number] Date of Birth: 01/20/1951  Transition of [facility]) CM/SW [place], LCSW Phone Number: 04/20/2022, 9:53 AM  Clinical Narrative:   Confirmed with [place] with [facility] patient is active for RN, PT, SW.         Expected Discharge Plan and Services                                                 Social Determinants of Health (SDOH) Interventions    Readmission Risk Interventions    04/13/2022   10:44 AM 06/23/2021    2:20 PM  Readmission Risk Prevention Plan  Post Dischage Appt  Complete  Medication Screening  Complete  Transportation Screening Complete Complete  PCP or Specialist Appt within 3-5 Days Complete   HRI or [place] Complete   Social Work Consult for [place] Planning/Counseling Complete   Palliative Care Screening Not Applicable   Medication Review Press photographer) Complete

## 2022-04-20 NOTE — Progress Notes (Signed)
Mobility Specialist - Progress Note   04/20/22 1119  Mobility  Activity Ambulated independently in hallway;Stood at bedside  Level of Assistance Modified independent, requires aide device or extra time  Assistive Device Front wheel walker  Distance Ambulated (ft) 160 ft  Activity Response Tolerated well  $Mobility charge 1 Mobility    Pt sitting in recliner upon arrival using RA. Completes STS indep and ambulates 111f ModI/Supervision + vc to prevent colliding into wall. Voices it was d/t RW not working correctly but takes place ~ 5 times throughout gait, drifting to the left often. Pt tolerates well overall and needs no hands on assist. Left in recliner with RN present.  MMerrily BrittleMobility Specialist 04/20/22, 11:22 AM

## 2022-04-20 NOTE — Progress Notes (Signed)
Incision is clean, dry, and intact after incisional VAC removed Foot is warm with pulse present Eventually will need carotid surgery, but would recommend waiting a couple of weeks until she is stronger and gets more therapy Can follow-up in our office in 1 to 2 weeks

## 2022-04-20 NOTE — Consult Note (Addendum)
[place] Nurse Consult Note: Reason for Consult: Requested to assist with Vac.  EMR reviewed; Dr Lucky Cowboy of the Vascular team performed surgery on 6/7 to the left groin and a Prevena Vac dressing was applied at that time.  According to the patient's discharge orders during the previous admission, Vac was ordered to be discontinued on 6/14.  Discussed plan of care with Dr Lucky Cowboy via Secure Chat; he ordered Prevena to be removed and a dry dressing and tape to be applied daily.  Wound type: Left groin with previous full thickness surgical site with staples dry and well approximated, no erythremia, drainage or fluctuance. Dry dressing applied.  Pt should plan to follow-up with Dr Lucky Cowboy as an outpatient.  Dressing: Topical treatment orders provided for bedside nurses to perform Q day as follows:  Apply gauze and tape to left groin Q day. Please re-consult if further assistance is needed.  Thank-you,  Julien Girt MSN, [place], [place], [place], [place]

## 2022-04-21 DIAGNOSIS — I959 Hypotension, unspecified: Secondary | ICD-10-CM | POA: Diagnosis not present

## 2022-04-21 DIAGNOSIS — A419 Sepsis, unspecified organism: Secondary | ICD-10-CM

## 2022-04-21 DIAGNOSIS — J189 Pneumonia, unspecified organism: Secondary | ICD-10-CM | POA: Diagnosis not present

## 2022-04-21 DIAGNOSIS — N179 Acute kidney failure, unspecified: Secondary | ICD-10-CM | POA: Diagnosis not present

## 2022-04-21 DIAGNOSIS — R531 Weakness: Secondary | ICD-10-CM | POA: Diagnosis not present

## 2022-04-21 LAB — CBC WITH DIFFERENTIAL/PLATELET
Abs Immature Granulocytes: 0.04 10*3/uL (ref 0.00–0.07)
Basophils Absolute: 0 10*3/uL (ref 0.0–0.1)
Basophils Relative: 0 %
Eosinophils Absolute: 0.2 10*3/uL (ref 0.0–0.5)
Eosinophils Relative: 3 %
HCT: 26.2 % — ABNORMAL LOW (ref 36.0–46.0)
Hemoglobin: 8.3 g/dL — ABNORMAL LOW (ref 12.0–15.0)
Immature Granulocytes: 0 %
Lymphocytes Relative: 23 %
Lymphs Abs: 2.1 10*3/uL (ref 0.7–4.0)
MCH: 28.9 pg (ref 26.0–34.0)
MCHC: 31.7 g/dL (ref 30.0–36.0)
MCV: 91.3 fL (ref 80.0–100.0)
Monocytes Absolute: 0.6 10*3/uL (ref 0.1–1.0)
Monocytes Relative: 7 %
Neutro Abs: 6 10*3/uL (ref 1.7–7.7)
Neutrophils Relative %: 67 %
Platelets: 277 10*3/uL (ref 150–400)
RBC: 2.87 MIL/uL — ABNORMAL LOW (ref 3.87–5.11)
RDW: 14.9 % (ref 11.5–15.5)
WBC: 9 10*3/uL (ref 4.0–10.5)
nRBC: 0 % (ref 0.0–0.2)

## 2022-04-21 LAB — BASIC METABOLIC PANEL
Anion gap: 6 (ref 5–15)
BUN: 11 mg/dL (ref 8–23)
CO2: 26 mmol/L (ref 22–32)
Calcium: 8.5 mg/dL — ABNORMAL LOW (ref 8.9–10.3)
Chloride: 108 mmol/L (ref 98–111)
Creatinine, Ser: 0.73 mg/dL (ref 0.44–1.00)
GFR, Estimated: >60 mL/min (ref >60)
Glucose, Bld: 130 mg/dL — ABNORMAL HIGH (ref 70–99)
Potassium: 4.2 mmol/L (ref 3.5–5.1)
Sodium: 140 mmol/L (ref 135–145)

## 2022-04-21 LAB — GLUCOSE, CAPILLARY
Glucose-Capillary: 112 mg/dL — ABNORMAL HIGH (ref 70–99)
Glucose-Capillary: 78 mg/dL (ref 70–99)
Glucose-Capillary: 95 mg/dL (ref 70–99)
Glucose-Capillary: 179 mg/dL — ABNORMAL HIGH (ref 70–99)

## 2022-04-21 MED ORDER — AZITHROMYCIN 500 MG PO TABS
500.0000 mg | ORAL_TABLET | ORAL | Status: DC
  Administered 2022-04-21: 500 mg via ORAL
  Filled 2022-04-21: qty 1

## 2022-04-21 NOTE — Progress Notes (Signed)
[facility] PROGRESS NOTE    Progress Note  Autumn Arnold  QQP:[id number] DOB: 14-May-1951 DOA: 04/19/2022 PCP: [name], MD     Brief Narrative:   Autumn Arnold is an 71 y.o. female past medical history of a recent stroke, critical limb ischemia status post stent placement to the left lower extremity, subsequently a wound VAC was placed, who comes in for increased weakness frequent falls and confusion.  CT head of the abdomen and pelvis were unremarkable, on arrival to the ED Foley was placed a liter of urine drained.  Chest x-ray showed nonspecific opacity with a leukocytosis was found to be hypotensive blood cultures were ordered started empirically on antibiotics and fluid resuscitated.   Assessment/Plan:   Generalized Weakness: Possibly due to hypotension and ongoing medication use. She has been aggressively fluid resuscitated and her weakness has improved. Physical therapy evaluated the patient recommended home health PT. She is positive about 2 L.  Sepsis probably due to community-acquired pneumonia: She had a mild leukocytosis and infiltrates she was started empirically on IV antibiotics Rocephin and azithromycin, as there was a concern for pneumonia. Tmax of 98.3. Blood cultures remain negative till date. Procalcitonin is low yield, lactic acid is 1.3. UA showed no signs of infection. We will continue IV antibiotics for an additional 24 hours.  Acute kidney injury: Likely prerenal azotemia, antihypertensive medications were held, blood pressure is improved. She has been fluid resuscitated. Creatinine has improved to baseline. KVO IV fluids.  History of recent stroke: CT of the head unremarkable. Continue dual antiplatelet therapy and statins. Antihypertensive medications were held due to hypotension. Physical therapy evaluated the patient, she will go home with home health PT.  Lower back pain: MRI of the lumbar spine showed no acute abnormalities.   Small disc protrusion L4-L5 L2-L3 and L3-L4 protrusion at left on out to L3 could be affecting the nerve root.  At L4-L5 there is moderate facet hypertrophy with associated edema. These findings could be contributing to her left-sided lower extremity and generalized weakness. She will need to go home with home health PT.  Peripheral artery disease: Status post left lower extremity stent placement continue aspirin statin and Plavix. Wound VAC in place, vascular was consulted who examined the wound looks clean and dry. Wound VAC was placed back in. They recommended to follow-up as an outpatient with them in 2 weeks.  Diabetes mellitus type 2: Blood glucose well controlled sliding scale insulin continue current regimen.  Sleep apnea: She uses oxygen at nighttime.  DVT prophylaxis: lovenox Family Communication:none Status is: Observation The patient will require care spanning > 2 midnights and should be moved to inpatient because: Acute kidney injury    Code Status:     Code Status Orders  (From admission, onward)           Start     Ordered   04/19/22 2105  Full code  Continuous        04/19/22 2105           Code Status History     Date Active Date Inactive Code Status Order ID Comments User Context   04/04/2022 0420 04/13/2022 1948 Full Code [id number]  Athena Masse, MD ED   06/26/2021 1103 06/28/2021 1836 DNR [id number]  Loletha Grayer, MD Inpatient   06/23/2021 0028 06/26/2021 1103 Full Code [id number]  Athena Masse, MD ED   11/07/2015 1046 11/08/2015 1716 Full Code [id number]  Loletha Grayer, MD ED  IV Access:   Peripheral IV   Procedures and diagnostic studies:   MR LUMBAR SPINE WO CONTRAST  Result Date: 04/20/2022 CLINICAL DATA:  Initial evaluation for acute low back pain, cauda equina syndrome suspected. EXAM: MRI LUMBAR SPINE WITHOUT CONTRAST TECHNIQUE: Multiplanar, multisequence MR imaging of the lumbar spine was performed. No intravenous  contrast was administered. COMPARISON:  None Available. FINDINGS: Segmentation: Standard. Lowest well-formed disc space labeled the L5-S1 level. Alignment: Trace facet mediated anterolisthesis of L4 on L5. Alignment otherwise normal preservation of the normal lumbar lordosis. Vertebrae: Vertebral body height maintained without acute or chronic fracture. Bone marrow signal intensity within normal limits. No discrete or worrisome osseous lesions. Reactive marrow edema present about the right L4-5 facet due to facet arthritis. No other abnormal marrow edema. Conus medullaris and cauda equina: Conus extends to the L1-2 level. Conus and cauda equina appear normal. Paraspinal and other soft tissues: Unremarkable. Disc levels: L1-2:  Unremarkable. L2-3: Mild disc bulge. Superimposed small left foraminal to extraforaminal disc protrusion (series 8, image 17). Mild facet spurring. No spinal stenosis. Foramina remain patent. L3-4: Disc desiccation with mild disc bulge. Superimposed small left foraminal to extraforaminal disc protrusion (series 8, image 23). No spinal stenosis. Mild left L3 foraminal narrowing. Right neural foramen remains patent. L4-5: Trace anterolisthesis with mild disc bulge and disc desiccation. Superimposed small biforaminal disc protrusions closely approximate and/or contacts the exiting L4 nerve roots bilaterally. Moderate right worse than left facet hypertrophy with associated marrow edema and joint effusion on the right. No significant spinal stenosis. Mild bilateral L4 foraminal narrowing. L5-S1:  Negative interspace.  Mild facet hypertrophy.  No stenosis. IMPRESSION: 1. No acute abnormality within the lumbar spine. No significant spinal stenosis or evidence for cord compression. 2. Small biforaminal disc protrusions at L4-5, potentially affecting either of the exiting L4 nerve roots. 3. Small left foraminal to extraforaminal disc protrusions at L2-3 and L3-4, potentially affecting the exiting left  L2 and L3 nerve roots respectively. 4. Moderate right worse than left facet hypertrophy at L4-5 with associated marrow edema. Finding could serve as a source for lower back pain. Electronically Signed   By: Jeannine Boga M.D.   On: 04/20/2022 00:36   DG Chest Portable 1 View  Result Date: 04/19/2022 CLINICAL DATA:  Shortness of breath.  Fall. EXAM: PORTABLE CHEST 1 VIEW COMPARISON:  Chest CT 04/04/2022. Prior chest radiographs 04/03/2022 and earlier. FINDINGS: Shallow inspiration radiograph. Heart size within normal limits. Aortic atherosclerosis. Ill-defined opacity within the bilateral lung bases, favored to reflect atelectasis. No evidence of pleural effusion or pneumothorax. No acute bony abnormality identified. IMPRESSION: Shallow inspiration radiograph. Ill-defined opacity within the bilateral lung bases. This is favored to reflect atelectasis. However, aspiration/pneumonia cannot be excluded and clinical correlation is recommended. Aortic Atherosclerosis (ICD10-I70.0). Electronically Signed   By: Kellie Simmering D.O.   On: 04/19/2022 15:48   CT ABDOMEN PELVIS WO CONTRAST  Result Date: 04/19/2022 CLINICAL DATA:  Abdominal pain.  Multiple falls EXAM: CT ABDOMEN AND PELVIS WITHOUT CONTRAST TECHNIQUE: Multidetector CT imaging of the abdomen and pelvis was performed following the standard protocol without IV contrast. RADIATION DOSE REDUCTION: This exam was performed according to the departmental dose-optimization program which includes automated exposure control, adjustment of the mA and/or kV according to patient size and/or use of iterative reconstruction technique. COMPARISON:  None Available. FINDINGS: Lower chest: Lung bases are clear. Hepatobiliary: No focal hepatic lesion. No biliary duct dilatation. Common bile duct is normal. Pancreas: Pancreas is normal. No ductal dilatation.  No pancreatic inflammation. Spleen: Normal spleen Adrenals/urinary tract: Adrenal glands and kidneys are normal. The  ureters and bladder normal. Stomach/Bowel: Stomach, small bowel, appendix, and cecum are normal. The colon and rectosigmoid colon are normal. Vascular/Lymphatic: Extensive vascular calcification of the aorta. Endoluminal stent in place. No lymphadenopathy. Reproductive: Post hysterectomy.  Adnexa unremarkable Other: No intraperitoneal free fluid. Musculoskeletal: No fracture in the pelvis or spine. Insert patient sclerosis of the RIGHT femoral head. No aggressive osseous lesion. IMPRESSION: 1. no acute findings abdomen pelvis. 2. No evidence of trauma. 3. Potential avascular necrosis of the RIGHT femoral head. 4.  Aortic Atherosclerosis (ICD10-I70.0). Electronically Signed   By: Suzy Bouchard M.D.   On: 04/19/2022 15:41   CT HEAD WO CONTRAST (5MM)  Result Date: 04/19/2022 CLINICAL DATA:  Neuro deficit, acute, stroke suspected EXAM: CT HEAD WITHOUT CONTRAST TECHNIQUE: Contiguous axial images were obtained from the base of the skull through the vertex without intravenous contrast. RADIATION DOSE REDUCTION: This exam was performed according to the departmental dose-optimization program which includes automated exposure control, adjustment of the mA and/or kV according to patient size and/or use of iterative reconstruction technique. COMPARISON:  04/04/2022 FINDINGS: Brain: There is no acute intracranial hemorrhage, mass effect, or edema. Gray-white differentiation is preserved. Small chronic infarcts of the right thalamus and left corona radiata. There is no extra-axial fluid collection. Ventricles and sulci are stable in size and configuration. Vascular: There is atherosclerotic calcification at the skull base. Skull: Calvarium is unremarkable. Sinuses/Orbits: No acute finding. Other: None. IMPRESSION: No acute intracranial abnormality. Stable chronic/nonemergent findings detailed above. Electronically Signed   By: Macy Mis M.D.   On: 04/19/2022 15:35     Medical Consultants:   None.   Subjective:     Autumn Arnold relates she feels significantly better than when she came in.  Objective:    Vitals:   04/20/22 0748 04/20/22 1553 04/20/22 2106 04/21/22 0505  BP: (!) 111/59 (!) 120/40 105/62 117/72  Pulse: 100 93 95 61  Resp: '16  18 16  '$ Temp: 98 F (36.7 C) 97.7 F (36.5 C) 98.3 F (36.8 C) 97.7 F (36.5 C)  TempSrc:  Oral    SpO2: 92% 97% 98% 91%  Weight:      Height:       SpO2: 91 %   Intake/Output Summary (Last 24 hours) at 04/21/2022 0846 Last data filed at 04/21/2022 0525 Gross per 24 hour  Intake 881.64 ml  Output --  Net 881.64 ml    Filed Weights   04/19/22 1455 04/19/22 2108  Weight: 64.9 kg 66.1 kg    Exam: General exam: In no acute distress. Respiratory system: Good air movement and clear to auscultation. Cardiovascular system: S1 & S2 heard, RRR. No JVD. Gastrointestinal system: Abdomen is nondistended, soft and nontender.  Extremities: No pedal edema. Skin: No rashes, lesions or ulcers Psychiatry: Judgement and insight appear normal. Mood & affect appropriate.   Data Reviewed:    Labs: Basic Metabolic Panel: Recent Labs  Lab 04/19/22 1500 04/20/22 0736 04/21/22 0531  NA 136 138 140  K 4.1 3.5 4.2  CL 105 105 108  CO2 '23 25 26  '$ GLUCOSE 161* 129* 130*  BUN '22 13 11  '$ CREATININE 1.03* 0.76 0.73  CALCIUM 8.8* 8.8* 8.5*    GFR Estimated Creatinine Clearance: 56.3 mL/min (by C-G formula based on SCr of 0.73 mg/dL). Liver Function Tests: Recent Labs  Lab 04/19/22 1500 04/20/22 0736  AST 17 17  ALT 16 14  ALKPHOS 93 91  BILITOT 0.5 0.5  PROT 7.3 6.9  ALBUMIN 3.3* 3.1*    No results for input(s): "LIPASE", "AMYLASE" in the last 168 hours. No results for input(s): "AMMONIA" in the last 168 hours. Coagulation profile No results for input(s): "INR", "PROTIME" in the last 168 hours. COVID-19 Labs  No results for input(s): "DDIMER", "FERRITIN", "LDH", "CRP" in the last 72 hours.  Lab Results  Component Value Date    SARSCOV2NAA NEGATIVE 06/22/2021   [employer] Not Detected 07/21/2019    CBC: Recent Labs  Lab 04/19/22 1500 04/21/22 0531  WBC 13.7* 9.0  NEUTROABS 11.3* 6.0  HGB 9.6* 8.3*  HCT 30.1* 26.2*  MCV 89.6 91.3  PLT 304 277    Cardiac Enzymes: No results for input(s): "CKTOTAL", "CKMB", "CKMBINDEX", "TROPONINI" in the last 168 hours. BNP (last 3 results) No results for input(s): "PROBNP" in the last 8760 hours. CBG: Recent Labs  Lab 04/20/22 0751 04/20/22 1155 04/20/22 1633 04/20/22 2107 04/21/22 0824  GLUCAP 120* 150* 147* 187* 112*   D-Dimer: No results for input(s): "DDIMER" in the last 72 hours. Hgb A1c: No results for input(s): "HGBA1C" in the last 72 hours. Lipid Profile: No results for input(s): "CHOL", "HDL", "LDLCALC", "TRIG", "CHOLHDL", "LDLDIRECT" in the last 72 hours. Thyroid function studies: No results for input(s): "TSH", "T4TOTAL", "T3FREE", "THYROIDAB" in the last 72 hours.  Invalid input(s): "FREET3" Anemia work up: No results for input(s): "VITAMINB12", "FOLATE", "FERRITIN", "TIBC", "IRON", "RETICCTPCT" in the last 72 hours. Sepsis Labs: Recent Labs  Lab 04/19/22 1500 04/21/22 0531  PROCALCITON 0.27  --   WBC 13.7* 9.0  LATICACIDVEN 1.3  --     Microbiology Recent Results (from the past 240 hour(s))  MRSA Next Gen by PCR, Nasal     Status: None   Collection Time: 04/11/22  2:37 PM   Specimen: Nasal Mucosa; Nasal Swab  Result Value Ref Range Status   MRSA by PCR Next Gen NOT DETECTED NOT DETECTED Final    Comment: (NOTE) The GeneXpert MRSA Assay (FDA approved for NASAL specimens only), is one component of a comprehensive MRSA colonization surveillance program. It is not intended to diagnose MRSA infection nor to guide or monitor treatment for MRSA infections. Test performance is not FDA approved in patients less than 50 years old. Performed at [facility], [address] [place], [place] [address]   Blood culture (routine x  2)     Status: None (Preliminary result)   Collection Time: 04/19/22  3:00 PM   Specimen: BLOOD  Result Value Ref Range Status   Specimen Description BLOOD LEFT ASSIST CONTROL  Final   Special Requests   Final    BOTTLES DRAWN AEROBIC AND ANAEROBIC Blood Culture adequate volume   Culture   Final    NO GROWTH 2 DAYS Performed at [facility], [address] [place], [place] [address]    Report Status PENDING  Incomplete  Blood culture (routine x 2)     Status: None (Preliminary result)   Collection Time: 04/19/22  9:43 PM   Specimen: BLOOD  Result Value Ref Range Status   Specimen Description BLOOD RIGHT HAND  Final   Special Requests   Final    BOTTLES DRAWN AEROBIC ONLY Blood Culture adequate volume   Culture   Final    NO GROWTH 2 DAYS Performed at [facility], [address] [place], [place] [address]    Report Status PENDING  Incomplete     Medications:  amitriptyline  150 mg Oral Daily   aspirin EC  81 mg Oral Daily   atorvastatin  80 mg Oral Daily   busPIRone  5 mg Oral BID   clopidogrel  75 mg Oral Daily   gabapentin  600 mg Oral BID   insulin aspart  0-5 Units Subcutaneous QHS   insulin aspart  0-9 Units Subcutaneous TID WC   insulin aspart  3 Units Subcutaneous TID WC   metoprolol tartrate  12.5 mg Oral BID   montelukast  10 mg Oral Daily   pantoprazole  40 mg Oral Daily   polyethylene glycol  17 g Oral Daily   sertraline  150 mg Oral Daily   vitamin B-12  1,000 mcg Oral Daily   Continuous Infusions:  azithromycin Stopped (04/20/22 2213)   cefTRIAXone (ROCEPHIN)  IV Stopped (04/20/22 1853)      LOS: 1 day   Charlynne Cousins  [facility]  04/21/2022, 8:46 AM

## 2022-04-21 NOTE — Progress Notes (Signed)
Pt's capillary glucose at 1704 = 95 per POCT glucometer; results have not flowed into the Results tab as of this documentation; the order number is [id number]; the results tab for this indicates in process; unable to enter result manually as of this writing

## 2022-04-22 DIAGNOSIS — D72829 Elevated white blood cell count, unspecified: Secondary | ICD-10-CM

## 2022-04-22 DIAGNOSIS — N179 Acute kidney failure, unspecified: Secondary | ICD-10-CM | POA: Diagnosis not present

## 2022-04-22 DIAGNOSIS — R531 Weakness: Secondary | ICD-10-CM | POA: Diagnosis not present

## 2022-04-22 DIAGNOSIS — I959 Hypotension, unspecified: Secondary | ICD-10-CM | POA: Diagnosis not present

## 2022-04-22 LAB — BASIC METABOLIC PANEL
Anion gap: 7 (ref 5–15)
BUN: 11 mg/dL (ref 8–23)
CO2: 26 mmol/L (ref 22–32)
Calcium: 8.7 mg/dL — ABNORMAL LOW (ref 8.9–10.3)
Chloride: 110 mmol/L (ref 98–111)
Creatinine, Ser: 0.62 mg/dL (ref 0.44–1.00)
GFR, Estimated: >60 mL/min (ref >60)
Glucose, Bld: 148 mg/dL — ABNORMAL HIGH (ref 70–99)
Potassium: 4.7 mmol/L (ref 3.5–5.1)
Sodium: 143 mmol/L (ref 135–145)

## 2022-04-22 LAB — GLUCOSE, CAPILLARY
Glucose-Capillary: 131 mg/dL — ABNORMAL HIGH (ref 70–99)
Glucose-Capillary: 85 mg/dL (ref 70–99)

## 2022-04-22 MED ORDER — AZITHROMYCIN 500 MG PO TABS: 500.0000 mg | ORAL_TABLET | Freq: Every day | ORAL | 0 refills | Status: AC

## 2022-04-22 MED ORDER — AZITHROMYCIN 500 MG PO TABS: 500.0000 mg | ORAL_TABLET | Freq: Every day | ORAL | 0 refills | Status: DC

## 2022-04-22 MED ORDER — AMOXICILLIN-POT CLAVULANATE 875-125 MG PO TABS: 1.0000 | ORAL_TABLET | Freq: Two times a day (BID) | ORAL | 0 refills | Status: AC

## 2022-04-22 NOTE — Discharge Summary (Addendum)
Physician Discharge Summary  Autumn Arnold BMW:[id number] DOB: 27-Sep-1951 DOA: 04/19/2022  PCP: [name], MD  Admit date: 04/19/2022 Discharge date: 04/22/2022  Admitted From: Home Disposition:  Home  Recommendations for Outpatient Follow-up:  Follow up with Vascular Surgery in 1-2 weeks Please obtain BMP/CBC in one week   Home Health:Yes Equipment/Devices:None   Discharge Condition:Stable CODE STATUS:Full Diet recommendation: Heart Healthy  Brief/Interim Summary: 71 y.o. female past medical history of a recent stroke, critical limb ischemia status post stent placement to the left lower extremity, subsequently a wound VAC was placed, who comes in for increased weakness frequent falls and confusion.  CT head of the abdomen and pelvis were unremarkable, on arrival to the ED Foley was placed a liter of urine drained.  Chest x-ray showed nonspecific opacity with a leukocytosis was found to be hypotensive blood cultures were ordered started empirically on antibiotics and fluid resuscitated.  Discharge Diagnoses:  Principal Problem:   Weakness Active Problems:   Hypotension   Type 2 diabetes mellitus with hyperlipidemia (HCC)   Anxiety and depression   OSA (obstructive sleep apnea)   COPD (chronic obstructive pulmonary disease) (HCC)   AKI (acute kidney injury) (HCC)   Leukocytosis   Low back pain   PVD (peripheral vascular disease) ([place])  Sepsis possibly due to community-acquired pneumonia: She had a mild leukocytosis and infiltrate on chest x-ray. She was started on Rocephin and azithromycin. She defervesced blood cultures remain negative. UA showed no signs of infection. She will continue her antibiotic regimen for 2 additional days an outpatient.  Generalized weakness: Possibly due to hypotension, infectious etiology and ongoing antihypertensive medication use. She was fluid resuscitated physical therapy evaluated the patient who recommended to continue home  health PT as an outpatient she was positive about 2 L.  Acute kidney injury: Likely prerenal azotemia in the setting of sepsis and ongoing antihypertensive medication. Antihypertensive medications were held she was fluid resuscitated creatinine returned to baseline.  History of recent stroke: CT of the head was unremarkable she was continued on an thi dual platelet therapy.  Diabetes mellitus type 2: No change made to his medication.  Sleep apnea: Continue oxygen at nighttime.   Discharge Instructions  Discharge Instructions     Diet - low sodium heart healthy   Complete by: As directed    Increase activity slowly   Complete by: As directed    No wound care   Complete by: As directed       Allergies as of 04/22/2022       Reactions   Lisinopril Other (See Comments)   Tongue burning sensation   Plavix [clopidogrel] Other (See Comments)   Pt denies allergy.        Medication List     TAKE these medications    albuterol 108 (90 Base) MCG/ACT inhaler Commonly known as: VENTOLIN HFA Inhale 2 puffs into the lungs as needed.   alendronate 70 MG tablet Commonly known as: FOSAMAX Take 70 mg by mouth once a week. Take with a full glass of water on an empty stomach.   amitriptyline 150 MG tablet Commonly known as: ELAVIL Take 150 mg by mouth daily. Reported on 12/20/2015   amoxicillin-clavulanate 875-125 MG tablet Commonly known as: AUGMENTIN Take 1 tablet by mouth 2 (two) times daily for 2 days.   aspirin EC 81 MG tablet Take 1 tablet (81 mg total) by mouth daily.   atorvastatin 80 MG tablet Commonly known as: LIPITOR Take 80 mg by mouth daily.  azithromycin 500 MG tablet Commonly known as: Zithromax Take 1 tablet (500 mg total) by mouth daily for 2 days.   busPIRone 5 MG tablet Commonly known as: BUSPAR Take 5 mg by mouth 2 (two) times daily.   Calcium Carb-Cholecalciferol 600-400 MG-UNIT Tabs Take 1 tablet by mouth daily.   clopidogrel 75 MG  tablet Commonly known as: PLAVIX Take 1 tablet (75 mg total) by mouth daily.   gabapentin 600 MG tablet Commonly known as: NEURONTIN Take 1,200 mg by mouth 2 (two) times daily.   metFORMIN 1000 MG tablet Commonly known as: GLUCOPHAGE Take 1,000 mg by mouth 2 (two) times daily.   metoprolol tartrate 25 MG tablet Commonly known as: LOPRESSOR Take 0.5 tablets (12.5 mg total) by mouth 2 (two) times daily.   montelukast 10 MG tablet Commonly known as: SINGULAIR Take 10 mg by mouth daily.   multivitamin with minerals Tabs tablet Take 1 tablet by mouth daily.   nicotine 21 mg/24hr patch Commonly known as: NICODERM CQ - dosed in mg/24 hours Place 1 patch (21 mg total) onto the skin daily.   oxyCODONE-acetaminophen 5-325 MG tablet Commonly known as: PERCOCET/ROXICET Take 1-2 tablets by mouth every 4 (four) hours as needed for moderate pain.   pantoprazole 40 MG tablet Commonly known as: PROTONIX Take 40 mg by mouth daily.   senna-docusate 8.6-50 MG tablet Commonly known as: Senokot-S Take 2 tablets by mouth at bedtime as needed for mild constipation.   sertraline 50 MG tablet Commonly known as: Zoloft Take 3 tablets (150 mg total) by mouth daily.   tiZANidine 4 MG tablet Commonly known as: ZANAFLEX Take 4 mg by mouth 2 (two) times daily.   vitamin B-12 1000 MCG tablet Commonly known as: CYANOCOBALAMIN Take 1,000 mcg by mouth daily. Reported on 12/20/2015        Allergies  Allergen Reactions   Lisinopril Other (See Comments)    Tongue burning sensation   Plavix [Clopidogrel] Other (See Comments)    Pt denies allergy.    Consultations: Vascular surgery   Procedures/Studies: MR LUMBAR SPINE WO CONTRAST  Result Date: 04/20/2022 CLINICAL DATA:  Initial evaluation for acute low back pain, cauda equina syndrome suspected. EXAM: MRI LUMBAR SPINE WITHOUT CONTRAST TECHNIQUE: Multiplanar, multisequence MR imaging of the lumbar spine was performed. No intravenous  contrast was administered. COMPARISON:  None Available. FINDINGS: Segmentation: Standard. Lowest well-formed disc space labeled the L5-S1 level. Alignment: Trace facet mediated anterolisthesis of L4 on L5. Alignment otherwise normal preservation of the normal lumbar lordosis. Vertebrae: Vertebral body height maintained without acute or chronic fracture. Bone marrow signal intensity within normal limits. No discrete or worrisome osseous lesions. Reactive marrow edema present about the right L4-5 facet due to facet arthritis. No other abnormal marrow edema. Conus medullaris and cauda equina: Conus extends to the L1-2 level. Conus and cauda equina appear normal. Paraspinal and other soft tissues: Unremarkable. Disc levels: L1-2:  Unremarkable. L2-3: Mild disc bulge. Superimposed small left foraminal to extraforaminal disc protrusion (series 8, image 17). Mild facet spurring. No spinal stenosis. Foramina remain patent. L3-4: Disc desiccation with mild disc bulge. Superimposed small left foraminal to extraforaminal disc protrusion (series 8, image 23). No spinal stenosis. Mild left L3 foraminal narrowing. Right neural foramen remains patent. L4-5: Trace anterolisthesis with mild disc bulge and disc desiccation. Superimposed small biforaminal disc protrusions closely approximate and/or contacts the exiting L4 nerve roots bilaterally. Moderate right worse than left facet hypertrophy with associated marrow edema and joint effusion on the right.  No significant spinal stenosis. Mild bilateral L4 foraminal narrowing. L5-S1:  Negative interspace.  Mild facet hypertrophy.  No stenosis. IMPRESSION: 1. No acute abnormality within the lumbar spine. No significant spinal stenosis or evidence for cord compression. 2. Small biforaminal disc protrusions at L4-5, potentially affecting either of the exiting L4 nerve roots. 3. Small left foraminal to extraforaminal disc protrusions at L2-3 and L3-4, potentially affecting the exiting left  L2 and L3 nerve roots respectively. 4. Moderate right worse than left facet hypertrophy at L4-5 with associated marrow edema. Finding could serve as a source for lower back pain. Electronically Signed   By: Jeannine Boga M.D.   On: 04/20/2022 00:36   DG Chest Portable 1 View  Result Date: 04/19/2022 CLINICAL DATA:  Shortness of breath.  Fall. EXAM: PORTABLE CHEST 1 VIEW COMPARISON:  Chest CT 04/04/2022. Prior chest radiographs 04/03/2022 and earlier. FINDINGS: Shallow inspiration radiograph. Heart size within normal limits. Aortic atherosclerosis. Ill-defined opacity within the bilateral lung bases, favored to reflect atelectasis. No evidence of pleural effusion or pneumothorax. No acute bony abnormality identified. IMPRESSION: Shallow inspiration radiograph. Ill-defined opacity within the bilateral lung bases. This is favored to reflect atelectasis. However, aspiration/pneumonia cannot be excluded and clinical correlation is recommended. Aortic Atherosclerosis (ICD10-I70.0). Electronically Signed   By: Kellie Simmering D.O.   On: 04/19/2022 15:48   CT ABDOMEN PELVIS WO CONTRAST  Result Date: 04/19/2022 CLINICAL DATA:  Abdominal pain.  Multiple falls EXAM: CT ABDOMEN AND PELVIS WITHOUT CONTRAST TECHNIQUE: Multidetector CT imaging of the abdomen and pelvis was performed following the standard protocol without IV contrast. RADIATION DOSE REDUCTION: This exam was performed according to the departmental dose-optimization program which includes automated exposure control, adjustment of the mA and/or kV according to patient size and/or use of iterative reconstruction technique. COMPARISON:  None Available. FINDINGS: Lower chest: Lung bases are clear. Hepatobiliary: No focal hepatic lesion. No biliary duct dilatation. Common bile duct is normal. Pancreas: Pancreas is normal. No ductal dilatation. No pancreatic inflammation. Spleen: Normal spleen Adrenals/urinary tract: Adrenal glands and kidneys are normal. The  ureters and bladder normal. Stomach/Bowel: Stomach, small bowel, appendix, and cecum are normal. The colon and rectosigmoid colon are normal. Vascular/Lymphatic: Extensive vascular calcification of the aorta. Endoluminal stent in place. No lymphadenopathy. Reproductive: Post hysterectomy.  Adnexa unremarkable Other: No intraperitoneal free fluid. Musculoskeletal: No fracture in the pelvis or spine. Insert patient sclerosis of the RIGHT femoral head. No aggressive osseous lesion. IMPRESSION: 1. no acute findings abdomen pelvis. 2. No evidence of trauma. 3. Potential avascular necrosis of the RIGHT femoral head. 4.  Aortic Atherosclerosis (ICD10-I70.0). Electronically Signed   By: Suzy Bouchard M.D.   On: 04/19/2022 15:41   CT HEAD WO CONTRAST (5MM)  Result Date: 04/19/2022 CLINICAL DATA:  Neuro deficit, acute, stroke suspected EXAM: CT HEAD WITHOUT CONTRAST TECHNIQUE: Contiguous axial images were obtained from the base of the skull through the vertex without intravenous contrast. RADIATION DOSE REDUCTION: This exam was performed according to the departmental dose-optimization program which includes automated exposure control, adjustment of the mA and/or kV according to patient size and/or use of iterative reconstruction technique. COMPARISON:  04/04/2022 FINDINGS: Brain: There is no acute intracranial hemorrhage, mass effect, or edema. Gray-white differentiation is preserved. Small chronic infarcts of the right thalamus and left corona radiata. There is no extra-axial fluid collection. Ventricles and sulci are stable in size and configuration. Vascular: There is atherosclerotic calcification at the skull base. Skull: Calvarium is unremarkable. Sinuses/Orbits: No acute finding. Other: None. IMPRESSION:  No acute intracranial abnormality. Stable chronic/nonemergent findings detailed above. Electronically Signed   By: Macy Mis M.D.   On: 04/19/2022 15:35   DG C-Arm 1-60 Min-No Report  Result Date:  04/11/2022 Fluoroscopy was utilized by the requesting physician.  No radiographic interpretation.   PERIPHERAL VASCULAR CATHETERIZATION  Result Date: 04/05/2022 See surgical note for result.  US Carotid Bilateral  Result Date: 04/05/2022 CLINICAL DATA:  Cerebrovascular accident EXAM: BILATERAL CAROTID DUPLEX ULTRASOUND TECHNIQUE: Pearline Cables scale imaging, color Doppler and duplex ultrasound were performed of bilateral carotid and vertebral arteries in the neck. COMPARISON:  None Available. FINDINGS: Criteria: Quantification of carotid stenosis is based on velocity parameters that correlate the residual internal carotid diameter with NASCET-based stenosis levels, using the diameter of the distal internal carotid lumen as the denominator for stenosis measurement. The following velocity measurements were obtained: RIGHT ICA: 139/43 cm/sec CCA: 56/2 cm/sec SYSTOLIC ICA/CCA RATIO:  2.1 ECA:  114 cm/sec LEFT ICA: 208/31 cm/sec CCA: 130/86 cm/sec SYSTOLIC ICA/CCA RATIO:  1.5 ECA:  190 cm/sec RIGHT CAROTID ARTERY: No flow visualized within the right proximal and mid common carotid artery. Flow is visualized distally in the common carotid artery just proximal to the bifurcation. The external carotid artery flow is reversed. Abnormal arterial waveform in the internal carotid artery. Modest heterogeneous atherosclerotic plaque in the proximal internal carotid artery. Evaluation of peak systolic velocity criteria is limited in the setting of proximal common carotid occlusion. RIGHT VERTEBRAL ARTERY: Retrograde flow in the right vertebral artery. LEFT CAROTID ARTERY: Heterogeneous and irregular/ulcerated atherosclerotic plaque in the left internal carotid artery. Peak systolic velocity is likely spuriously elevated secondary to contralateral occlusion. LEFT VERTEBRAL ARTERY:  Patent with normal antegrade flow. IMPRESSION: 1. Right common carotid artery occlusion with distal reconstitution via retrograde flow through the external  carotid artery. 2. Retrograde right vertebral artery flow. This may be due to concurrent stenosis or occlusion in the right brachiocephalic artery. 3. Moderate heterogeneous and irregular/ulcerated atherosclerotic plaque in the proximal left internal carotid artery. Elevation of the peak systolic velocity is likely greater than expected for the degree of stenosis secondary to the contralateral common carotid artery occlusion. Please refer to recent CT arteriogram of the neck for degree of stenosis. 4. Left vertebral artery flow is antegrade. Electronically Signed   By: Jacqulynn Cadet M.D.   On: 04/05/2022 15:56   ECHOCARDIOGRAM COMPLETE  Result Date: 04/05/2022    ECHOCARDIOGRAM REPORT   Patient Name:   Autumn Arnold Date of Exam: 04/05/2022 Medical Rec #:  [id number]      Height:       60.0 in Accession #:    [id number]     Weight:       140.0 lb Date of Birth:  [birth year]       BSA:          1.604 m Patient Age:    13 years       BP:           155/53 mmHg Patient Gender: F              HR:           82 bpm. Exam Location:  [facility] Procedure: 2D Echo, Color Doppler and Cardiac Doppler Indications:     I63.9 Stroke  History:         Patient has prior history of Echocardiogram examinations, most                  recent 06/24/2021.  COPD; Risk Factors:Hypertension, Diabetes,                  Dyslipidemia and Sleep Apnea.  Sonographer:     Charmayne Sheer Referring Phys:  [id number] Athena Masse Diagnosing Phys: Isaias Cowman MD IMPRESSIONS  1. Left ventricular ejection fraction, by estimation, is 60 to 65%. The left ventricle has normal function. The left ventricle has no regional wall motion abnormalities. Left ventricular diastolic parameters were normal.  2. Right ventricular systolic function is normal. The right ventricular size is normal.  3. The mitral valve is normal in structure. Moderate mitral valve regurgitation. No evidence of mitral stenosis.  4. The aortic valve is normal in structure. Aortic valve  regurgitation is mild. No aortic stenosis is present.  5. The inferior vena cava is normal in size with greater than 50% respiratory variability, suggesting right atrial pressure of 3 mmHg. FINDINGS  Left Ventricle: Left ventricular ejection fraction, by estimation, is 60 to 65%. The left ventricle has normal function. The left ventricle has no regional wall motion abnormalities. The left ventricular internal cavity size was normal in size. There is  no left ventricular hypertrophy. Left ventricular diastolic parameters were normal. Right Ventricle: The right ventricular size is normal. No increase in right ventricular wall thickness. Right ventricular systolic function is normal. Left Atrium: Left atrial size was normal in size. Right Atrium: Right atrial size was normal in size. Pericardium: There is no evidence of pericardial effusion. Mitral Valve: The mitral valve is normal in structure. Moderate mitral valve regurgitation. No evidence of mitral valve stenosis. MV peak gradient, 8.0 mmHg. The mean mitral valve gradient is 4.0 mmHg. Tricuspid Valve: The tricuspid valve is normal in structure. Tricuspid valve regurgitation is mild . No evidence of tricuspid stenosis. Aortic Valve: The aortic valve is normal in structure. Aortic valve regurgitation is mild. Aortic regurgitation PHT measures 532 msec. No aortic stenosis is present. Aortic valve mean gradient measures 3.0 mmHg. Aortic valve peak gradient measures 4.9 mmHg. Aortic valve area, by VTI measures 1.17 cm. Pulmonic Valve: The pulmonic valve was normal in structure. Pulmonic valve regurgitation is not visualized. No evidence of pulmonic stenosis. Aorta: The aortic root is normal in size and structure. Venous: The inferior vena cava is normal in size with greater than 50% respiratory variability, suggesting right atrial pressure of 3 mmHg. IAS/Shunts: No atrial level shunt detected by color flow Doppler.  LEFT VENTRICLE PLAX 2D LVIDd:         4.06 cm    Diastology LVIDs:         2.46 cm   LV e' medial:    5.98 cm/s LV PW:         0.97 cm   LV E/e' medial:  16.4 LV IVS:        0.86 cm   LV e' lateral:   7.40 cm/s LVOT diam:     1.40 cm   LV E/e' lateral: 13.3 LV SV:         30 LV SV Index:   19 LVOT Area:     1.54 cm  RIGHT VENTRICLE RV Basal diam:  2.54 cm RV S prime:     10.20 cm/s LEFT ATRIUM             Index        RIGHT ATRIUM           Index LA diam:        3.50 cm 2.18 cm/m  RA Area:     12.30 cm LA Vol (A2C):   45.0 ml 28.05 ml/m  RA Volume:   25.20 ml  15.71 ml/m LA Vol (A4C):   37.1 ml 23.13 ml/m LA Biplane Vol: 42.6 ml 26.56 ml/m  AORTIC VALVE                    PULMONIC VALVE AV Area (Vmax):    1.30 cm     PV Vmax:       0.77 m/s AV Area (Vmean):   1.09 cm     PV Vmean:      52.200 cm/s AV Area (VTI):     1.17 cm     PV VTI:        0.158 m AV Vmax:           111.00 cm/s  PV Peak grad:  2.4 mmHg AV Vmean:          85.400 cm/s  PV Mean grad:  1.0 mmHg AV VTI:            0.260 m AV Peak Grad:      4.9 mmHg AV Mean Grad:      3.0 mmHg LVOT Vmax:         94.00 cm/s LVOT Vmean:        60.700 cm/s LVOT VTI:          0.197 m LVOT/AV VTI ratio: 0.76 AI PHT:            532 msec  AORTA Ao Root diam: 2.80 cm MITRAL VALVE MV Area (PHT): 7.09 cm    SHUNTS MV Area VTI:   0.95 cm    Systemic VTI:  0.20 m MV Peak grad:  8.0 mmHg    Systemic Diam: 1.40 cm MV Mean grad:  4.0 mmHg MV Vmax:       1.41 m/s MV Vmean:      91.5 cm/s MV Decel Time: 107 msec MV E velocity: 98.10 cm/s MV A velocity: 99.00 cm/s MV E/A ratio:  0.99 Isaias Cowman MD Electronically signed by Isaias Cowman MD Signature Date/Time: 04/05/2022/1:24:54 PM    Final    CT ANGIO HEAD NECK W WO CM  Result Date: 04/04/2022 CLINICAL DATA:  Stroke/TIA, determine embolic source EXAM: CT ANGIOGRAPHY HEAD AND NECK TECHNIQUE: Multidetector CT imaging of the head and neck was performed using the standard protocol during bolus administration of intravenous contrast. Multiplanar CT image  reconstructions and MIPs were obtained to evaluate the vascular anatomy. Carotid stenosis measurements (when applicable) are obtained utilizing NASCET criteria, using the distal internal carotid diameter as the denominator. RADIATION DOSE REDUCTION: This exam was performed according to the departmental dose-optimization program which includes automated exposure control, adjustment of the mA and/or kV according to patient size and/or use of iterative reconstruction technique. CONTRAST:  20m OMNIPAQUE IOHEXOL 350 MG/ML SOLN COMPARISON:  Recent CT and MR imaging, CTA neck August 2022, MRA August 2022 FINDINGS: CT HEAD Brain: There is no acute intracranial hemorrhage, mass effect, or edema. No new loss of gray-white differentiation. Chronic right thalamic infarct. Chronic infarct of the left basal ganglia and adjacent white matter. There is no extra-axial fluid collection. Ventricles and sulci are stable in size and configuration. Vascular: No new finding. Skull: Calvarium is unremarkable. Sinuses/Orbits: No acute finding. Other: None. Review of the MIP images confirms the above findings CTA NECK Aortic arch: Mixed plaque along the arch and great vessel origins. As before, there is moderate  to marked stenosis of the right subclavian proximally. Right carotid system: Common carotid remains occluded just beyond the origin. Reconstitution just below the bifurcation. There is mixed plaque extending along the proximal internal carotid with stable less than 50% stenosis. Left carotid system: Patent. Mixed plaque along the common carotid with less than 50% stenosis. Mixed plaque along the proximal internal carotid causing approximately 50% stenosis. Vertebral arteries: Patent. Plaque at the origins causing mild to moderate stenosis. Left vertebral is mildly dominant. Skeleton: Cervical spine degenerative changes are similar in appearance. Other neck: Unremarkable. Upper chest: Refer to same day chest CT Review of the MIP  images confirms the above findings CTA HEAD Anterior circulation: Intracranial internal carotid arteries are patent with calcified plaque. There is moderate stenosis of the supraclinoid right ICA. Posterior circulation: Intracranial vertebral arteries are patent with plaque causing mild stenosis. Basilar artery is patent. Posterior cerebral arteries are patent. A right posterior communicating artery is identified. Venous sinuses: Patent as allowed by contrast bolus timing. Review of the MIP images confirms the above findings IMPRESSION: No acute intracranial abnormality. Unchanged occlusion of the right common carotid just beyond its origin to just proximal to the bifurcation. No new occlusion or hemodynamically significant stenosis. Plaque at the proximal right ICA causes less than 50% stenosis. Plaque at the proximal left ICA causes 50% stenosis. Electronically Signed   By: Macy Mis M.D.   On: 04/04/2022 10:37   MR BRAIN WO CONTRAST  Result Date: 04/04/2022 CLINICAL DATA:  Initial evaluation for neuro deficit, stroke suspected. EXAM: MRI HEAD WITHOUT CONTRAST TECHNIQUE: Multiplanar, multiecho pulse sequences of the brain and surrounding structures were obtained without intravenous contrast. COMPARISON:  Prior CT from 04/03/2022. FINDINGS: Brain: Examination degraded by motion artifact. Generalized age-related cerebral atrophy. Chronic microvascular ischemic disease noted involving the supratentorial cerebral white matter and pons. Few scattered remote lacunar infarcts present about the right thalamus, left basal ganglia, and left corona radiata. Tiny remote left cerebellar infarct. Subtle subcentimeter focus of diffusion signal abnormality seen involving the cortical/subcortical aspect of the right frontoparietal region (series 5, images 34, 33), suspicious for a tiny acute to early subacute ischemic infarct. Involvement of the pre and postcentral gyri. No associated hemorrhage or mass effect. No other  evidence for acute or subacute ischemia. Gray-white matter differentiation otherwise maintained. No areas of chronic cortical infarction. No visible acute or chronic intracranial blood products. No mass lesion, midline shift or mass effect. No hydrocephalus or extra-axial fluid collection. Vascular: Major intracranial vascular flow voids are maintained. Skull and upper cervical spine: Craniocervical junction within normal limits. Bone marrow signal intensity normal. No scalp soft tissue abnormality. Sinuses/Orbits: Prior bilateral ocular lens replacement. Paranasal sinuses are largely clear. Trace right mastoid effusion, of doubtful significance. Other: None. IMPRESSION: 1. Subtle subcentimeter focus of diffusion signal abnormality involving the cortical/subcortical aspect of the right frontoparietal region, suspicious for a tiny acute to early subacute ischemic infarct. No associated hemorrhage or mass effect. 2. No other acute intracranial abnormality. 3. Underlying age-related cerebral atrophy with chronic small vessel ischemic disease with multiple remote lacunar infarcts as above. Electronically Signed   By: Jeannine Boga M.D.   On: 04/04/2022 02:49   CT Chest W Contrast  Result Date: 04/04/2022 CLINICAL DATA:  Abnormal chest x-ray EXAM: CT CHEST WITH CONTRAST TECHNIQUE: Multidetector CT imaging of the chest was performed during intravenous contrast administration. RADIATION DOSE REDUCTION: This exam was performed according to the departmental dose-optimization program which includes automated exposure control, adjustment of the mA  and/or kV according to patient size and/or use of iterative reconstruction technique. CONTRAST:  64m OMNIPAQUE IOHEXOL 300 MG/ML  SOLN COMPARISON:  Chest x-ray 04/03/2022.  Chest CT 06/22/2021 FINDINGS: Cardiovascular: Diffuse coronary artery and scattered moderate aortic calcifications. Heart is normal size. Aorta is normal caliber. Mediastinum/Nodes: No mediastinal,  hilar, or axillary adenopathy. Trachea and esophagus are unremarkable. Thyroid unremarkable. Lungs/Pleura: No confluent airspace opacities. In particular, no right lower lobe opacity as suggested on x-ray. No pleural effusions. Upper Abdomen: Imaging into the upper abdomen demonstrates no acute findings. Musculoskeletal: Chest wall soft tissues are unremarkable. No acute bony abnormality. IMPRESSION: No acute cardiopulmonary disease.  No right lower consolidation. Diffuse coronary artery disease. Aortic Atherosclerosis (ICD10-I70.0). Electronically Signed   By: KRolm BaptiseM.D.   On: 04/04/2022 01:08   DG Tibia/Fibula Left  Result Date: 04/03/2022 CLINICAL DATA:  Weakness EXAM: LEFT TIBIA AND FIBULA - 2 VIEW COMPARISON:  None Available. FINDINGS: There is no evidence of fracture or other focal bone lesions. Soft tissues are unremarkable. Atherosclerotic plaque. Partially visualized screw fixation of the foot. IMPRESSION: No acute displaced fracture or dislocation. Electronically Signed   By: MIven FinnM.D.   On: 04/03/2022 20:00   DG Hip Unilat With Pelvis 2-3 Views Left  Result Date: 04/03/2022 CLINICAL DATA:  Weakness EXAM: DG HIP (WITH OR WITHOUT PELVIS) 2-3V LEFT COMPARISON:  None Available. FINDINGS: Limited evaluation due to overlapping osseous structures and overlying soft tissues. There is no evidence of hip fracture or dislocation of the left hip. No acute displaced fracture or diastasis of the bones of the pelvis. Right hip grossly unremarkable. There is no evidence of arthropathy or other focal bone abnormality. Surgical tacks overlie the pelvis. Atherosclerotic plaque.  Aorto bi-iliac artery stent. IMPRESSION: 1. Negative for acute traumatic injury. 2. Aortic Atherosclerosis (ICD10-I70.0) in a patient status post aorto bi-iliac artery stent. Electronically Signed   By: MIven FinnM.D.   On: 04/03/2022 20:00   DG Chest 2 View  Result Date: 04/03/2022 CLINICAL DATA:  Weakness EXAM:  CHEST - 2 VIEW COMPARISON:  Chest x-ray 11/08/2011, CT abdomen pelvis 11/30/2021, CT abdomen pelvis 06/24/2021 FINDINGS: The heart and mediastinal contours are within normal limits. Atherosclerotic plaque. Interval development of patchy airspace opacity of the right lower lung zone. No pulmonary edema. No pleural effusion. No pneumothorax. No acute osseous abnormality. IMPRESSION: 1. Interval development of patchy airspace opacity of the right lower lung zone. Followup PA and lateral chest X-ray is recommended in 3-4 weeks following therapy to ensure resolution and exclude underlying malignancy. 2.  Aortic Atherosclerosis (ICD10-I70.0). Electronically Signed   By: MIven FinnM.D.   On: 04/03/2022 19:58   CT Head Wo Contrast  Result Date: 04/03/2022 CLINICAL DATA:  Blurred vision EXAM: CT HEAD WITHOUT CONTRAST TECHNIQUE: Contiguous axial images were obtained from the base of the skull through the vertex without intravenous contrast. RADIATION DOSE REDUCTION: This exam was performed according to the departmental dose-optimization program which includes automated exposure control, adjustment of the mA and/or kV according to patient size and/or use of iterative reconstruction technique. COMPARISON:  02/26/2022 FINDINGS: Brain: No evidence of acute infarction, hemorrhage, hydrocephalus, extra-axial collection or mass lesion/mass effect. Vascular: Intracranial atherosclerosis. Skull: Normal. Negative for fracture or focal lesion. Sinuses/Orbits: The visualized paranasal sinuses are essentially clear. The mastoid air cells are unopacified. Other: None. IMPRESSION: Normal head CT. Electronically Signed   By: SJulian HyM.D.   On: 04/03/2022 19:36   (Echo, Carotid, EGD, Colonoscopy, ERCP)  Subjective: No complaints  Discharge Exam: Vitals:   04/22/22 0519 04/22/22 0758  BP: (!) 119/49 (!) 116/54  Pulse: 81 80  Resp: 18 17  Temp: 97.6 F (36.4 C) 97.9 F (36.6 C)  SpO2: 95% 96%   Vitals:    04/21/22 1841 04/21/22 2157 04/22/22 0519 04/22/22 0758  BP: (!) 112/51 (!) 134/44 (!) 119/49 (!) 116/54  Pulse: 93 95 81 80  Resp: '16 16 18 17  '$ Temp: 98.4 F (36.9 C) 98.7 F (37.1 C) 97.6 F (36.4 C) 97.9 F (36.6 C)  TempSrc: Oral Oral    SpO2: 97% 97% 95% 96%  Weight:      Height:        General: Pt is alert, awake, not in acute distress Cardiovascular: RRR, S1/S2 +, no rubs, no gallops Respiratory: CTA bilaterally, no wheezing, no rhonchi Abdominal: Soft, NT, ND, bowel sounds + Extremities: no edema, no cyanosis    The results of significant diagnostics from this hospitalization (including imaging, microbiology, ancillary and laboratory) are listed below for reference.     Microbiology: Recent Results (from the past 240 hour(s))  Blood culture (routine x 2)     Status: None (Preliminary result)   Collection Time: 04/19/22  3:00 PM   Specimen: BLOOD  Result Value Ref Range Status   Specimen Description BLOOD LEFT ASSIST CONTROL  Final   Special Requests   Final    BOTTLES DRAWN AEROBIC AND ANAEROBIC Blood Culture adequate volume   Culture   Final    NO GROWTH 3 DAYS Performed at [facility], [address], [place], [place] [address]    Report Status PENDING  Incomplete  Blood culture (routine x 2)     Status: None (Preliminary result)   Collection Time: 04/19/22  9:43 PM   Specimen: BLOOD  Result Value Ref Range Status   Specimen Description BLOOD RIGHT HAND  Final   Special Requests   Final    BOTTLES DRAWN AEROBIC ONLY Blood Culture adequate volume   Culture   Final    NO GROWTH 3 DAYS Performed at [facility], [address], [place], [place] [address]    Report Status PENDING  Incomplete     Labs: BNP (last 3 results) No results for input(s): "BNP" in the last 8760 hours. Basic Metabolic Panel: Recent Labs  Lab 04/19/22 1500 04/20/22 0736 04/21/22 0531 04/22/22 0532  NA 136 138 140 143  K 4.1 3.5 4.2 4.7  CL 105 105 108  110  CO2 '23 25 26 26  '$ GLUCOSE 161* 129* 130* 148*  BUN '22 13 11 11  '$ CREATININE 1.03* 0.76 0.73 0.62  CALCIUM 8.8* 8.8* 8.5* 8.7*   Liver Function Tests: Recent Labs  Lab 04/19/22 1500 04/20/22 0736  AST 17 17  ALT 16 14  ALKPHOS 93 91  BILITOT 0.5 0.5  PROT 7.3 6.9  ALBUMIN 3.3* 3.1*   No results for input(s): "LIPASE", "AMYLASE" in the last 168 hours. No results for input(s): "AMMONIA" in the last 168 hours. CBC: Recent Labs  Lab 04/19/22 1500 04/21/22 0531  WBC 13.7* 9.0  NEUTROABS 11.3* 6.0  HGB 9.6* 8.3*  HCT 30.1* 26.2*  MCV 89.6 91.3  PLT 304 277   Cardiac Enzymes: No results for input(s): "CKTOTAL", "CKMB", "CKMBINDEX", "TROPONINI" in the last 168 hours. BNP: Invalid input(s): "POCBNP" CBG: Recent Labs  Lab 04/21/22 1313 04/21/22 1704 04/21/22 2158 04/22/22 0737 04/22/22 1206  GLUCAP 78 95 179* 131* 85   D-Dimer  No results for input(s): "DDIMER" in the last 72 hours. Hgb A1c No results for input(s): "HGBA1C" in the last 72 hours. Lipid Profile No results for input(s): "CHOL", "HDL", "LDLCALC", "TRIG", "CHOLHDL", "LDLDIRECT" in the last 72 hours. Thyroid function studies No results for input(s): "TSH", "T4TOTAL", "T3FREE", "THYROIDAB" in the last 72 hours.  Invalid input(s): "FREET3" Anemia work up No results for input(s): "VITAMINB12", "FOLATE", "FERRITIN", "TIBC", "IRON", "RETICCTPCT" in the last 72 hours. Urinalysis    Component Value Date/Time   COLORURINE YELLOW (A) 04/19/2022 1849   APPEARANCEUR CLEAR (A) 04/19/2022 1849   LABSPEC 1.011 04/19/2022 1849   PHURINE 5.0 04/19/2022 1849   GLUCOSEU NEGATIVE 04/19/2022 1849   HGBUR NEGATIVE 04/19/2022 1849   BILIRUBINUR NEGATIVE 04/19/2022 1849   KETONESUR NEGATIVE 04/19/2022 1849   PROTEINUR NEGATIVE 04/19/2022 1849   NITRITE NEGATIVE 04/19/2022 1849   LEUKOCYTESUR NEGATIVE 04/19/2022 1849   Sepsis Labs Recent Labs  Lab 04/19/22 1500 04/21/22 0531  WBC 13.7* 9.0    Microbiology Recent Results (from the past 240 hour(s))  Blood culture (routine x 2)     Status: None (Preliminary result)   Collection Time: 04/19/22  3:00 PM   Specimen: BLOOD  Result Value Ref Range Status   Specimen Description BLOOD LEFT ASSIST CONTROL  Final   Special Requests   Final    BOTTLES DRAWN AEROBIC AND ANAEROBIC Blood Culture adequate volume   Culture   Final    NO GROWTH 3 DAYS Performed at [facility], [address], [place], [place] [address]    Report Status PENDING  Incomplete  Blood culture (routine x 2)     Status: None (Preliminary result)   Collection Time: 04/19/22  9:43 PM   Specimen: BLOOD  Result Value Ref Range Status   Specimen Description BLOOD RIGHT HAND  Final   Special Requests   Final    BOTTLES DRAWN AEROBIC ONLY Blood Culture adequate volume   Culture   Final    NO GROWTH 3 DAYS Performed at [facility], [address], [place], [place] [address]    Report Status PENDING  Incomplete     SIGNED:   Charlynne Cousins, MD  [facility] 04/22/2022, 2:00 PM Pager   If 7PM-7AM, please contact night-coverage [email or web address] Password [email or web address]

## 2022-04-22 NOTE — TOC Transition Note (Signed)
Transition of Care [facility]) - CM/SW Discharge Note   Patient Details  Name: Autumn Arnold MRN: [id number] Date of Birth: [birth year]  [facility]) CM/SW Contact:  Izola Price, RN Phone Number: 04/22/2022, 10:39 AM   Clinical Narrative:  6/18: Patient discharging today. Spoke with patient regarding discharge plan with [facility] and has needed DME. [facility] notified of discharge via secure text message. Patient did not have other questions or concerns regarding discharge plan. [name] RN CM     Final next level of care: [place] Barriers to Discharge: Barriers Resolved   Patient Goals and CMS Choice        Discharge Placement                       Discharge Plan and Services                              Date [facility] Agency Contacted: 04/22/22 Time [place]: [phone] Representative spoke with at [place]: [place] to update on DC  Social Determinants of Health (SDOH) Interventions     Readmission Risk Interventions    04/13/2022   10:44 AM 06/23/2021    2:20 PM  Readmission Risk Prevention Plan  Post Dischage Appt  Complete  Medication Screening  Complete  Transportation Screening Complete Complete  PCP or Specialist Appt within 3-5 Days Complete   HRI or [place] Complete   Social Work Consult for [place] Planning/Counseling Complete   Palliative Care Screening Not Applicable   Medication Review Press photographer) Complete

## 2022-04-22 NOTE — Progress Notes (Addendum)
D?C instructions provided to pt. Pt request review of instruction with sister. Will return for review with arrival of sister.

## 2022-04-24 ENCOUNTER — Ambulatory Visit (INDEPENDENT_AMBULATORY_CARE_PROVIDER_SITE_OTHER): Payer: Medicare HMO | Admitting: Vascular Surgery

## 2022-04-24 LAB — CULTURE, BLOOD (ROUTINE X 2)
Culture: NO GROWTH
Culture: NO GROWTH
Special Requests: ADEQUATE
Special Requests: ADEQUATE

## 2022-04-30 ENCOUNTER — Other Ambulatory Visit: Payer: Self-pay

## 2022-04-30 ENCOUNTER — Encounter: Payer: Self-pay | Admitting: Emergency Medicine

## 2022-04-30 ENCOUNTER — Emergency Department
Admission: EM | Admit: 2022-04-30 | Discharge: 2022-04-30 | Disposition: A | Discharge status: Home / Self Care | Payer: Medicare HMO | Attending: Emergency Medicine | Admitting: Emergency Medicine

## 2022-04-30 ENCOUNTER — Emergency Department: Payer: Medicare HMO

## 2022-04-30 DIAGNOSIS — W19XXXA Unspecified fall, initial encounter: Secondary | ICD-10-CM

## 2022-04-30 DIAGNOSIS — S0990XA Unspecified injury of head, initial encounter: Secondary | ICD-10-CM | POA: Insufficient documentation

## 2022-04-30 DIAGNOSIS — M25552 Pain in left hip: Secondary | ICD-10-CM | POA: Diagnosis not present

## 2022-04-30 DIAGNOSIS — Z043 Encounter for examination and observation following other accident: Secondary | ICD-10-CM | POA: Diagnosis not present

## 2022-04-30 DIAGNOSIS — J984 Other disorders of lung: Secondary | ICD-10-CM | POA: Diagnosis not present

## 2022-04-30 DIAGNOSIS — M4312 Spondylolisthesis, cervical region: Secondary | ICD-10-CM | POA: Diagnosis not present

## 2022-04-30 DIAGNOSIS — M1612 Unilateral primary osteoarthritis, left hip: Secondary | ICD-10-CM | POA: Diagnosis not present

## 2022-04-30 DIAGNOSIS — R0902 Hypoxemia: Secondary | ICD-10-CM | POA: Diagnosis not present

## 2022-04-30 DIAGNOSIS — W07XXXA Fall from chair, initial encounter: Secondary | ICD-10-CM | POA: Diagnosis not present

## 2022-04-30 DIAGNOSIS — S299XXA Unspecified injury of thorax, initial encounter: Secondary | ICD-10-CM | POA: Diagnosis not present

## 2022-04-30 MED ORDER — OXYCODONE HCL 5 MG PO TABS
5.0000 mg | ORAL_TABLET | Freq: Once | ORAL | Status: AC
  Administered 2022-04-30: 5 mg via ORAL
  Filled 2022-04-30: qty 1

## 2022-04-30 MED ORDER — ACETAMINOPHEN 500 MG PO TABS
1000.0000 mg | ORAL_TABLET | Freq: Once | ORAL | Status: AC
  Administered 2022-04-30: 1000 mg via ORAL
  Filled 2022-04-30: qty 2

## 2022-04-30 NOTE — ED Provider Notes (Signed)
[facility] Provider Note    Event Date/Time   First MD Initiated Contact with Patient 04/30/22 0935     (approximate)   History   Fall (/)   HPI  Autumn Arnold is a 71 y.o. female who presents to the ED for evaluation of Fall (/)   I review 6/18 DC summary where patient was admitted a few days medically for community-acquired pneumonia with associated generalized weakness and AKI.  Discharged home.  Patient presents to the ED from home for evaluation of a fall with left-sided hip pain.  Patient reports that she was getting up from her recliner, tripped on her feet and fell down, landing on her left hip.  Reports that she has been able to get up and ambulate, with some assistance, but due to the pain she presents to the ED.  Denies syncopal episodes or head injury.  Reports that she was doing okay prior to this.  Physical Exam   Triage Vital Signs: ED Triage Vitals  Enc Vitals Group     BP 04/30/22 0936 (!) 107/43     Pulse Rate 04/30/22 0936 79     Resp --      Temp 04/30/22 0936 97.7 F (36.5 C)     Temp Source 04/30/22 0936 Oral     SpO2 04/30/22 0936 100 %     Weight 04/30/22 0938 145 lb 11.6 oz (66.1 kg)     Height 04/30/22 0938 5' (1.524 m)     Head Circumference --      Peak Flow --      Pain Score 04/30/22 0938 7     Pain Loc --      Pain Edu? --      Excl. in GC? --     Most recent vital signs: Vitals:   04/30/22 0936  BP: (!) 107/43  Pulse: 79  Temp: 97.7 F (36.5 C)  SpO2: 100%    General: Awake, no distress.  CV:  Good peripheral perfusion.  Resp:  Normal effort.  CTAB Abd:  No distention.  MSK:  No deformity noted.  No overlying signs of trauma.  Has some poorly localizing tenderness over her left hip, but has intact active and passive range of motion to this hip. Otherwise, palpation of all 4 extremities without signs of deformity, tenderness or trauma. No signs of trauma to the back, deformity or  tenderness. Neuro:  No focal deficits appreciated. Cranial nerves II through XII intact 5/5 strength and sensation in all 4 extremities Other:     ED Results / Procedures / Treatments   Labs (all labs ordered are listed, but only abnormal results are displayed) Labs Reviewed - No data to display  EKG   RADIOLOGY CT head interpreted by me without evidence of acute intracranial pathology CT cervical spine interpreted by me without evidence of fracture or dislocation. Plain film of the pelvis and left hip interpreted by me without evidence of fracture or dislocation. CXR interpreted by me without evidence of acute cardiopulmonary pathology.  Official radiology report(s): DG Chest Portable 1 View  Result Date: 04/30/2022 CLINICAL DATA:  Trauma, fall EXAM: PORTABLE CHEST 1 VIEW COMPARISON:  04/19/2022 FINDINGS: Cardiac size is within normal limits. There is poor inspiration. There are no signs of pulmonary edema or focal pulmonary consolidation. Linear density in the medial lower lung fields may suggest crowding of bronchovascular structures due to poor inspiration or subsegmental atelectasis. There is no pleural effusion or pneumothorax.  IMPRESSION: There are no signs of pulmonary edema or focal pulmonary consolidation. Small linear densities in the medial lower lung fields may suggest crowding of normal bronchovascular structures or subsegmental atelectasis. Electronically Signed   By: Ernie Avena M.D.   On: 04/30/2022 10:37   DG HIP UNILAT WITH PELVIS 2-3 VIEWS LEFT  Result Date: 04/30/2022 CLINICAL DATA:  Loss balance.  Fall. EXAM: DG HIP (WITH OR WITHOUT PELVIS) 2-3V LEFT COMPARISON:  None Available. FINDINGS: No evidence of acute fracture or joint malalignment. Mild bilateral hip degenerative change. Aorta bi-iliac calcific atherosclerosis with vascular stents. IMPRESSION: No evidence of acute fracture or joint malalignment. Electronically Signed   By: Feliberto Harts M.D.    On: 04/30/2022 10:36   CT HEAD WO CONTRAST ( )  Result Date: 04/30/2022 CLINICAL DATA:  A 71 year old female presents with trauma to the head neck post fall. EXAM: CT HEAD WITHOUT CONTRAST CT CERVICAL SPINE WITHOUT CONTRAST TECHNIQUE: Multidetector CT imaging of the head and cervical spine was performed following the standard protocol without intravenous contrast. Multiplanar CT image reconstructions of the cervical spine were also generated. RADIATION DOSE REDUCTION: This exam was performed according to the departmental dose-optimization program which includes automated exposure control, adjustment of the mA and/or kV according to patient size and/or use of iterative reconstruction technique. COMPARISON:  April 19, 2022 FINDINGS: CT HEAD FINDINGS Brain: No evidence of acute infarction, hemorrhage, hydrocephalus, extra-axial collection or mass lesion/mass effect. Signs of mild atrophy and chronic microvascular ischemic change as before. Vascular: No hyperdense vessel or unexpected calcification. Skull: Normal. Negative for fracture or focal lesion. Sinuses/Orbits: Visualized paranasal sinuses and orbits without acute process. Other: None CT CERVICAL SPINE FINDINGS Alignment: Straightening of normal cervical lordotic curvature in the setting of multilevel spinal degenerative change with similar appearance to prior imaging. Signs of C5-6 interbody fusion. Mild anterolisthesis of C4 on C5 approximately 3 mm without change. Mild retrolisthesis of C5 on C6 and C6 on C7 approximately 2 mm without change. Skull base and vertebrae: No acute fracture. No primary bone lesion or focal pathologic process. Soft tissues and spinal canal: No prevertebral fluid or swelling. No visible canal hematoma. Disc levels: Multilevel spinal degenerative changes with facet arthropathy and disc space narrowing. Degenerative changes are most pronounced at C5-6, C6-7 and C7-T1 with respect to disc space narrowing. Facet arthropathy is  greatest in the upper cervical spine at C2-3, C3-4 and C4-5. Upper chest: Negative. Other: None IMPRESSION: 1. No acute intracranial abnormality. 2. No evidence of acute fracture or traumatic subluxation of the cervical spine. 3. Multilevel spinal degenerative changes and prior C5-6 spinal fusion without change. Electronically Signed   By: Donzetta Kohut M.D.   On: 04/30/2022 10:22   CT Cervical Spine Wo Contrast  Result Date: 04/30/2022 CLINICAL DATA:  A 71 year old female presents with trauma to the head neck post fall. EXAM: CT HEAD WITHOUT CONTRAST CT CERVICAL SPINE WITHOUT CONTRAST TECHNIQUE: Multidetector CT imaging of the head and cervical spine was performed following the standard protocol without intravenous contrast. Multiplanar CT image reconstructions of the cervical spine were also generated. RADIATION DOSE REDUCTION: This exam was performed according to the departmental dose-optimization program which includes automated exposure control, adjustment of the mA and/or kV according to patient size and/or use of iterative reconstruction technique. COMPARISON:  April 19, 2022 FINDINGS: CT HEAD FINDINGS Brain: No evidence of acute infarction, hemorrhage, hydrocephalus, extra-axial collection or mass lesion/mass effect. Signs of mild atrophy and chronic microvascular ischemic change as before. Vascular: No hyperdense  vessel or unexpected calcification. Skull: Normal. Negative for fracture or focal lesion. Sinuses/Orbits: Visualized paranasal sinuses and orbits without acute process. Other: None CT CERVICAL SPINE FINDINGS Alignment: Straightening of normal cervical lordotic curvature in the setting of multilevel spinal degenerative change with similar appearance to prior imaging. Signs of C5-6 interbody fusion. Mild anterolisthesis of C4 on C5 approximately 3 mm without change. Mild retrolisthesis of C5 on C6 and C6 on C7 approximately 2 mm without change. Skull base and vertebrae: No acute fracture. No  primary bone lesion or focal pathologic process. Soft tissues and spinal canal: No prevertebral fluid or swelling. No visible canal hematoma. Disc levels: Multilevel spinal degenerative changes with facet arthropathy and disc space narrowing. Degenerative changes are most pronounced at C5-6, C6-7 and C7-T1 with respect to disc space narrowing. Facet arthropathy is greatest in the upper cervical spine at C2-3, C3-4 and C4-5. Upper chest: Negative. Other: None IMPRESSION: 1. No acute intracranial abnormality. 2. No evidence of acute fracture or traumatic subluxation of the cervical spine. 3. Multilevel spinal degenerative changes and prior C5-6 spinal fusion without change. Electronically Signed   By: Donzetta Kohut M.D.   On: 04/30/2022 10:22    PROCEDURES and INTERVENTIONS:  Procedures  Medications  acetaminophen (TYLENOL) tablet 1,000 mg (1,000 mg Oral Given 04/30/22 1007)  oxyCODONE (Oxy IR/ROXICODONE) immediate release tablet 5 mg (5 mg Oral Given 04/30/22 1007)     IMPRESSION / MDM / ASSESSMENT AND PLAN / ED COURSE  I reviewed the triage vital signs and the nursing notes.  Differential diagnosis includes, but is not limited to, hip fracture, pelvic fracture, intracranial hemorrhage, soft tissue injury  {Patient presents with symptoms of an acute illness or injury that is potentially life-threatening.  71 year old woman on DAPT presents to the ED after mechanical fall with left hip pain.  Reassuring examination without clear signs of hip fracture, neurologic or vascular deficits.  We will image her pelvis and left hip due to her pain, as well as her head and neck due to being on DAPT and this fall.  I suspect her pain is MSK in etiology, possibly soft tissue strain or nonapparent bruising.  We discussed management at home and close return precautions.  She is suitable for outpatient management.  Clinical Course as of 04/30/22 1051  Mon Apr 30, 2022  1049 Reassessed.  Feeling better.  We  discussed reassuring imaging.  Husband is now at the bedside and is comfortable taking her home.  We discussed close return precautions. [DS]    Clinical Course User Index [DS] Delton Prairie, MD     FINAL CLINICAL IMPRESSION(S) / ED DIAGNOSES   Final diagnoses:  Fall, initial encounter  Left hip pain     Rx / DC Orders   ED Discharge Orders     None        Note:  This document was prepared using Dragon voice recognition software and may include unintentional dictation errors.   Delton Prairie, MD 04/30/22 1052

## 2022-04-30 NOTE — ED Triage Notes (Signed)
Presents via EMS s/p fall    states she was getting up from chair  landed on left hip

## 2022-05-01 ENCOUNTER — Telehealth (INDEPENDENT_AMBULATORY_CARE_PROVIDER_SITE_OTHER): Payer: Self-pay

## 2022-05-03 ENCOUNTER — Encounter (INDEPENDENT_AMBULATORY_CARE_PROVIDER_SITE_OTHER): Payer: Self-pay | Admitting: Nurse Practitioner

## 2022-05-03 ENCOUNTER — Ambulatory Visit (INDEPENDENT_AMBULATORY_CARE_PROVIDER_SITE_OTHER): Payer: Medicare HMO | Admitting: Nurse Practitioner

## 2022-05-03 ENCOUNTER — Other Ambulatory Visit (INDEPENDENT_AMBULATORY_CARE_PROVIDER_SITE_OTHER): Payer: Self-pay | Admitting: Nurse Practitioner

## 2022-05-03 VITALS — BP 121/54 | HR 90 | Resp 16 | Wt 128.2 lb

## 2022-05-03 DIAGNOSIS — T8149XA Infection following a procedure, other surgical site, initial encounter: Secondary | ICD-10-CM | POA: Diagnosis not present

## 2022-05-03 DIAGNOSIS — Z9181 History of falling: Secondary | ICD-10-CM | POA: Diagnosis not present

## 2022-05-03 DIAGNOSIS — I70213 Atherosclerosis of native arteries of extremities with intermittent claudication, bilateral legs: Secondary | ICD-10-CM

## 2022-05-03 DIAGNOSIS — Z91148 Patient's other noncompliance with medication regimen for other reason: Secondary | ICD-10-CM | POA: Diagnosis not present

## 2022-05-03 MED ORDER — SULFAMETHOXAZOLE-TRIMETHOPRIM 800-160 MG PO TABS: 1.0000 | ORAL_TABLET | Freq: Two times a day (BID) | ORAL | 0 refills | Status: DC

## 2022-05-03 NOTE — Telephone Encounter (Signed)
Arna Medici NP has new wound orders for left groin incision with starting wet to dry daily or medi honey daily. I left a message on home health nurse voicemail to return call back to the office to discuss which one would be best.

## 2022-05-04 NOTE — Telephone Encounter (Signed)
Wound orders for left groin to start medi honey daily has been fax to [facility]. Margaret informed that the home health will be going out this weekend to start nursing visits.

## 2022-05-04 NOTE — Telephone Encounter (Signed)
Home health will start with calcium alginate every 3 days for wound care until medi honey arrives

## 2022-05-08 LAB — AEROBIC CULTURE

## 2022-05-15 ENCOUNTER — Ambulatory Visit (INDEPENDENT_AMBULATORY_CARE_PROVIDER_SITE_OTHER): Payer: Medicare HMO | Admitting: Vascular Surgery

## 2022-05-15 ENCOUNTER — Encounter (INDEPENDENT_AMBULATORY_CARE_PROVIDER_SITE_OTHER): Payer: Self-pay | Admitting: Vascular Surgery

## 2022-05-15 VITALS — BP 114/52 | HR 89 | Resp 16

## 2022-05-15 DIAGNOSIS — I6523 Occlusion and stenosis of bilateral carotid arteries: Secondary | ICD-10-CM

## 2022-05-15 DIAGNOSIS — E1169 Type 2 diabetes mellitus with other specified complication: Secondary | ICD-10-CM

## 2022-05-15 DIAGNOSIS — E785 Hyperlipidemia, unspecified: Secondary | ICD-10-CM

## 2022-05-15 DIAGNOSIS — I70222 Atherosclerosis of native arteries of extremities with rest pain, left leg: Secondary | ICD-10-CM

## 2022-05-15 NOTE — Assessment & Plan Note (Signed)
Once her left groin wound has healed, considering a right carotid endarterectomy with retrograde stenting of the right common carotid artery and potentially a right subclavian stent as well. Discuss at follow up in 2-3 weeks.

## 2022-05-15 NOTE — Assessment & Plan Note (Signed)
Status post revascularization with markedly improved perfusion.  Wound is healing.

## 2022-05-15 NOTE — Progress Notes (Signed)
fol

## 2022-05-15 NOTE — Progress Notes (Signed)
Patient ID: Autumn Arnold, female   DOB: 03/31/1951, 71 y.o.   MRN: [id number]  Chief Complaint  Patient presents with   Follow-up    2 week wound check    HPI Autumn Arnold is a 71 y.o. female.  Patient returns in follow-up.  She is doing well.  She still has some numbness in her left foot as well as the left inner thigh but overall doing well.  Her wound has about a circular centimeter area of superficial separation with some slough but has otherwise healed.  She had a left femoral endarterectomy.  She also has a known right common carotid artery occlusion and right subclavian artery high-grade stenosis that we are potentially planning to address with right carotid endarterectomy and right common carotid artery stent placement as well as potential subclavian intervention as well.  We are waiting till her wounds are healed before addressing that and placing stents.  No further neurologic events after her stroke that prompted admission last month   Past Medical History:  Diagnosis Date   Anxiety    Arthritis    Asthma    Bilateral lower extremity edema    COPD (chronic obstructive pulmonary disease) ([place])    Cough    CHRONIC   Diabetes mellitus 2008   Edema    LEGS/FEET   GERD (gastroesophageal reflux disease)    Hyperlipidemia    Hypertension    Shortness of breath dyspnea    DOE   Sleep apnea    CPAP at [facility], Dr. Humphrey Rolls   Stroke [facility]) 02/2010   headache, left arm numbness   Wheezing     Past Surgical History:  Procedure Laterality Date   ABDOMINAL HYSTERECTOMY     ANTERIOR FUSION CERVICAL SPINE     CARPAL TUNNEL RELEASE     Bilateral   CATARACT EXTRACTION W/PHACO Left 03/12/2016   Procedure: CATARACT EXTRACTION PHACO AND INTRAOCULAR LENS PLACEMENT ([place]);  Surgeon: Estill Cotta, MD;  Location: [facility] ORS;  Service: Ophthalmology;  Laterality: Left;  [place] 01:12 AP% 23.4 CDE 29.83 fluid pack lot # [id number]   CATARACT EXTRACTION W/PHACO Right 04/16/2016   Procedure:  CATARACT EXTRACTION PHACO AND INTRAOCULAR LENS PLACEMENT (I[facility]);  Surgeon: Estill Cotta, MD;  Location: [facility];  Service: Ophthalmology;  Laterality: Right;  [place] 01:17 AP% 22.5 CDE 35.08 Fluid pack lot # [id number]   CESAREAN SECTION     COLONOSCOPY WITH PROPOFOL N/A 08/12/2018   Procedure: COLONOSCOPY WITH PROPOFOL;  Surgeon: Toledo, Benay Pike, MD;  Location: [facility];  Service: Gastroenterology;  Laterality: N/A;   ELBOW SURGERY     Tendonitis   ENDARTERECTOMY FEMORAL Left 04/11/2022   Procedure: ENDARTERECTOMY FEMORAL;  Surgeon: Algernon Huxley, MD;  Location: [facility];  Service: Vascular;  Laterality: Left;   ESOPHAGOGASTRODUODENOSCOPY (EGD) WITH PROPOFOL N/A 08/12/2018   Procedure: ESOPHAGOGASTRODUODENOSCOPY (EGD) WITH PROPOFOL;  Surgeon: Toledo, Benay Pike, MD;  Location: [facility];  Service: Gastroenterology;  Laterality: N/A;   INCONTINENCE SURGERY     INSERTION OF ILIAC STENT Left 04/11/2022   Procedure: INSERTION OF ILIAC STENT;  Surgeon: Algernon Huxley, MD;  Location: [facility];  Service: Vascular;  Laterality: Left;   LOWER EXTREMITY ANGIOGRAPHY Left 06/23/2021   Procedure: Lower Extremity Angiography;  Surgeon: Katha Cabal, MD;  Location: [place] [facility];  Service: Cardiovascular;  Laterality: Left;   LOWER EXTREMITY ANGIOGRAPHY Left 04/05/2022   Procedure: Lower Extremity Angiography;  Surgeon: Algernon Huxley, MD;  Location: [facility]  INVASIVE CV LAB;  Service: Cardiovascular;  Laterality: Left;   TONSILLECTOMY        Allergies  Allergen Reactions   Lisinopril Other (See Comments)    Tongue burning sensation   Plavix [Clopidogrel] Other (See Comments)    Pt denies allergy.    Current Outpatient Medications  Medication Sig Dispense Refill   albuterol (VENTOLIN HFA) 108 (90 Base) MCG/ACT inhaler Inhale 2 puffs into the lungs as needed.     alendronate (FOSAMAX) 70 MG tablet Take 70 mg by mouth once a week. Take with a full glass of water on an empty stomach.      amitriptyline (ELAVIL) 150 MG tablet Take 150 mg by mouth daily. Reported on 12/20/2015     aspirin EC 81 MG EC tablet Take 1 tablet (81 mg total) by mouth daily. 30 tablet 2   atorvastatin (LIPITOR) 80 MG tablet Take 80 mg by mouth daily.     busPIRone (BUSPAR) 5 MG tablet Take 5 mg by mouth 2 (two) times daily.     Calcium Carb-Cholecalciferol 600-400 MG-UNIT TABS Take 1 tablet by mouth daily.     gabapentin (NEURONTIN) 600 MG tablet Take 1,200 mg by mouth 2 (two) times daily.     metFORMIN (GLUCOPHAGE) 1000 MG tablet Take 1,000 mg by mouth 2 (two) times daily.     metoprolol tartrate (LOPRESSOR) 25 MG tablet Take 0.5 tablets (12.5 mg total) by mouth 2 (two) times daily. 60 tablet 0   montelukast (SINGULAIR) 10 MG tablet Take 10 mg by mouth daily.     Multiple Vitamin (MULTIVITAMIN WITH MINERALS) TABS tablet Take 1 tablet by mouth daily. 30 tablet 0   nicotine (NICODERM CQ - DOSED IN MG/24 HOURS) 21 mg/24hr patch Place 1 patch (21 mg total) onto the skin daily. 28 patch 0   pantoprazole (PROTONIX) 40 MG tablet Take 40 mg by mouth daily.     senna-docusate (SENOKOT-S) 8.6-50 MG tablet Take 2 tablets by mouth at bedtime as needed for mild constipation.     sertraline (ZOLOFT) 50 MG tablet Take 3 tablets (150 mg total) by mouth daily. 90 tablet 0   sulfamethoxazole-trimethoprim (BACTRIM DS) 800-160 MG tablet Take 1 tablet by mouth 2 (two) times daily. 20 tablet 0   tiZANidine (ZANAFLEX) 4 MG tablet Take 4 mg by mouth 2 (two) times daily.     vitamin B-12 (CYANOCOBALAMIN) 1000 MCG tablet Take 1,000 mcg by mouth daily. Reported on 12/20/2015     clopidogrel (PLAVIX) 75 MG tablet Take 1 tablet (75 mg total) by mouth daily. (Patient not taking: Reported on 02/26/2022) 30 tablet 2   oxyCODONE-acetaminophen (PERCOCET/ROXICET) 5-325 MG tablet Take 1-2 tablets by mouth every 4 (four) hours as needed for moderate pain. (Patient not taking: Reported on 05/03/2022) 30 tablet 0   No current facility-administered  medications for this visit.        Physical Exam BP (!) 114/52 (BP Location: Right Arm)   Pulse 89   Resp 16  Gen:  WD/WN, NAD Skin: incision as above.  Healing well with a mild superficial separation with some slough over about a circular centimeter area in the left groin.     Assessment/Plan:  Critical limb ischemia of left lower extremity (HCC) Status post revascularization with markedly improved perfusion.  Wound is healing.  Carotid stenosis Once her left groin wound has healed, considering a right carotid endarterectomy with retrograde stenting of the right common carotid artery and potentially a right subclavian stent as well.  Discuss at follow up in 2-3 weeks.  Type 2 diabetes mellitus with hyperlipidemia (HCC) blood glucose control important in reducing the progression of atherosclerotic disease. Also, involved in wound healing. On appropriate medications.      [name] 05/15/2022, 11:43 AM   This note was created with Dragon medical transcription system.  Any errors from dictation are unintentional.

## 2022-05-15 NOTE — Assessment & Plan Note (Signed)
blood glucose control important in reducing the progression of atherosclerotic disease. Also, involved in wound healing. On appropriate medications.  

## 2022-05-20 ENCOUNTER — Encounter (INDEPENDENT_AMBULATORY_CARE_PROVIDER_SITE_OTHER): Payer: Self-pay | Admitting: Nurse Practitioner

## 2022-05-20 NOTE — Progress Notes (Signed)
Subjective:    Patient ID: Autumn Arnold, female    DOB: [birth year], 71 y.o.   MRN: [id number] Chief Complaint  Patient presents with   Follow-up    Left groin wound check    Autumn Arnold is a 71 year old female that presents today for follow-up wound evaluation.  There was concern due to the appearance of puslike tissue in the wound bed.  There does not seem to be a significant odor.  Today the puslike area is actually fibrinous exudate from the wound.  There is a scant amount of dehiscing at the proximal portion.  There is also some redness associated around the area.  The patient continues to have some numbness in her inner thigh area which is not uncommon post femoral endarterectomy.    Review of Systems  Skin:  Positive for wound.  All other systems reviewed and are negative.      Objective:   Physical Exam Vitals reviewed.  HENT:     Head: Normocephalic.  Cardiovascular:     Rate and Rhythm: Normal rate.  Pulmonary:     Effort: Pulmonary effort is normal.  Skin:    General: Skin is warm and dry.  Neurological:     Mental Status: She is alert and oriented to person, place, and time.  Psychiatric:        Mood and Affect: Mood normal.        Behavior: Behavior normal.        Thought Content: Thought content normal.        Judgment: Judgment normal.     BP (!) 121/54 (BP Location: Left Arm)   Pulse 90   Resp 16   Wt 128 lb 3.2 oz (58.2 kg)   BMI 25.04 kg/m   Past Medical History:  Diagnosis Date   Anxiety    Arthritis    Asthma    Bilateral lower extremity edema    COPD (chronic obstructive pulmonary disease) (HCC)    Cough    CHRONIC   Diabetes mellitus 2008   Edema    LEGS/FEET   GERD (gastroesophageal reflux disease)    Hyperlipidemia    Hypertension    Shortness of breath dyspnea    DOE   Sleep apnea    CPAP at [facility], Dr. Humphrey Rolls   Stroke [facility]) 02/2010   headache, left arm numbness   Wheezing     Social History   Socioeconomic History    Marital status: Married    Spouse name: Not on file   Number of children: Not on file   Years of education: Not on file   Highest education level: Not on file  Occupational History   Not on file  Tobacco Use   Smoking status: Every Day    Packs/day: 1.00    Types: Cigarettes   Smokeless tobacco: Never  Substance and Sexual Activity   Alcohol use: Yes    Alcohol/week: 7.0 standard drinks of alcohol    Types: 7 Cans of beer per week   Drug use: No   Sexual activity: Not on file  Other Topics Concern   Not on file  Social History Narrative   Not on file   Social Determinants of Health   Financial Resource Strain: Not on file  Food Insecurity: Not on file  Transportation Needs: Not on file  Physical Activity: Not on file  Stress: Not on file  Social Connections: Not on file  Intimate Partner Violence: Not on file  Past Surgical History:  Procedure Laterality Date   ABDOMINAL HYSTERECTOMY     ANTERIOR FUSION CERVICAL SPINE     CARPAL TUNNEL RELEASE     Bilateral   CATARACT EXTRACTION W/PHACO Left 03/12/2016   Procedure: CATARACT EXTRACTION PHACO AND INTRAOCULAR LENS PLACEMENT ([place]);  Surgeon: Estill Cotta, MD;  Location: [facility];  Service: Ophthalmology;  Laterality: Left;  [place] 01:12 AP% 23.4 CDE 29.83 fluid pack lot # [id number]   CATARACT EXTRACTION W/PHACO Right 04/16/2016   Procedure: CATARACT EXTRACTION PHACO AND INTRAOCULAR LENS PLACEMENT ([facility]);  Surgeon: Estill Cotta, MD;  Location: [facility];  Service: Ophthalmology;  Laterality: Right;  [place] 01:17 AP% 22.5 CDE 35.08 Fluid pack lot # [id number]   CESAREAN SECTION     COLONOSCOPY WITH PROPOFOL N/A 08/12/2018   Procedure: COLONOSCOPY WITH PROPOFOL;  Surgeon: Toledo, Benay Pike, MD;  Location: [facility];  Service: Gastroenterology;  Laterality: N/A;   ELBOW SURGERY     Tendonitis   ENDARTERECTOMY FEMORAL Left 04/11/2022   Procedure: ENDARTERECTOMY FEMORAL;  Surgeon: Algernon Huxley, MD;  Location: [facility];   Service: Vascular;  Laterality: Left;   ESOPHAGOGASTRODUODENOSCOPY (EGD) WITH PROPOFOL N/A 08/12/2018   Procedure: ESOPHAGOGASTRODUODENOSCOPY (EGD) WITH PROPOFOL;  Surgeon: Toledo, Benay Pike, MD;  Location: [facility];  Service: Gastroenterology;  Laterality: N/A;   INCONTINENCE SURGERY     INSERTION OF ILIAC STENT Left 04/11/2022   Procedure: INSERTION OF ILIAC STENT;  Surgeon: Algernon Huxley, MD;  Location: [facility];  Service: Vascular;  Laterality: Left;   LOWER EXTREMITY ANGIOGRAPHY Left 06/23/2021   Procedure: Lower Extremity Angiography;  Surgeon: Katha Cabal, MD;  Location: [place] [facility];  Service: Cardiovascular;  Laterality: Left;   LOWER EXTREMITY ANGIOGRAPHY Left 04/05/2022   Procedure: Lower Extremity Angiography;  Surgeon: Algernon Huxley, MD;  Location: [place] [facility];  Service: Cardiovascular;  Laterality: Left;   TONSILLECTOMY      Family History  Problem Relation Age of Onset   CAD Mother    Hypertension Mother    Arthritis Mother    Diabetes Father    CAD Father     Allergies  Allergen Reactions   Lisinopril Other (See Comments)    Tongue burning sensation   Plavix [Clopidogrel] Other (See Comments)    Pt denies allergy.       Latest Ref Rng & Units 04/21/2022    5:31 AM 04/19/2022    3:00 PM 04/13/2022    3:37 AM  CBC  WBC 4.0 - 10.5 K/uL 9.0  13.7  9.6   Hemoglobin 12.0 - 15.0 g/dL 8.3  9.6  10.0   Hematocrit 36.0 - 46.0 % 26.2  30.1  30.9   Platelets 150 - 400 K/uL 277  304  196       CMP     Component Value Date/Time   NA 143 04/22/2022 0532   K 4.7 04/22/2022 0532   CL 110 04/22/2022 0532   CO2 26 04/22/2022 0532   GLUCOSE 148 (H) 04/22/2022 0532   BUN 11 04/22/2022 0532   CREATININE 0.62 04/22/2022 0532   CALCIUM 8.7 (L) 04/22/2022 0532   PROT 6.9 04/20/2022 0736   ALBUMIN 3.1 (L) 04/20/2022 0736   AST 17 04/20/2022 0736   ALT 14 04/20/2022 0736   ALKPHOS 91 04/20/2022 0736   BILITOT 0.5 04/20/2022 0736   GFRNONAA >60  04/22/2022 0532   GFRAA >60 11/08/2015 0419     No results found.  Assessment & Plan:   1. Atherosclerosis of native artery of both lower extremities with intermittent claudication ([place]) The patient's incision has some fibrinous exudate in the area.  We will plan on placing a wet-to-dry dressing of the patient's home health.  This should be changed on a daily basis.  The patient return in 3 weeks for wound reevaluation.  We will also place the patient on antibiotics.  We will also culture the wound.  Current Outpatient Medications on File Prior to Visit  Medication Sig Dispense Refill   albuterol (VENTOLIN HFA) 108 (90 Base) MCG/ACT inhaler Inhale 2 puffs into the lungs as needed.     alendronate (FOSAMAX) 70 MG tablet Take 70 mg by mouth once a week. Take with a full glass of water on an empty stomach.     amitriptyline (ELAVIL) 150 MG tablet Take 150 mg by mouth daily. Reported on 12/20/2015     aspirin EC 81 MG EC tablet Take 1 tablet (81 mg total) by mouth daily. 30 tablet 2   atorvastatin (LIPITOR) 80 MG tablet Take 80 mg by mouth daily.     busPIRone (BUSPAR) 5 MG tablet Take 5 mg by mouth 2 (two) times daily.     Calcium Carb-Cholecalciferol 600-400 MG-UNIT TABS Take 1 tablet by mouth daily.     gabapentin (NEURONTIN) 600 MG tablet Take 1,200 mg by mouth 2 (two) times daily.     metFORMIN (GLUCOPHAGE) 1000 MG tablet Take 1,000 mg by mouth 2 (two) times daily.     metoprolol tartrate (LOPRESSOR) 25 MG tablet Take 0.5 tablets (12.5 mg total) by mouth 2 (two) times daily. 60 tablet 0   montelukast (SINGULAIR) 10 MG tablet Take 10 mg by mouth daily.     Multiple Vitamin (MULTIVITAMIN WITH MINERALS) TABS tablet Take 1 tablet by mouth daily. 30 tablet 0   nicotine (NICODERM CQ - DOSED IN MG/24 HOURS) 21 mg/24hr patch Place 1 patch (21 mg total) onto the skin daily. 28 patch 0   pantoprazole (PROTONIX) 40 MG tablet Take 40 mg by mouth daily.     senna-docusate (SENOKOT-S) 8.6-50 MG  tablet Take 2 tablets by mouth at bedtime as needed for mild constipation.     sertraline (ZOLOFT) 50 MG tablet Take 3 tablets (150 mg total) by mouth daily. 90 tablet 0   tiZANidine (ZANAFLEX) 4 MG tablet Take 4 mg by mouth 2 (two) times daily.     vitamin B-12 (CYANOCOBALAMIN) 1000 MCG tablet Take 1,000 mcg by mouth daily. Reported on 12/20/2015     clopidogrel (PLAVIX) 75 MG tablet Take 1 tablet (75 mg total) by mouth daily. (Patient not taking: Reported on 02/26/2022) 30 tablet 2   oxyCODONE-acetaminophen (PERCOCET/ROXICET) 5-325 MG tablet Take 1-2 tablets by mouth every 4 (four) hours as needed for moderate pain. (Patient not taking: Reported on 05/03/2022) 30 tablet 0   No current facility-administered medications on file prior to visit.    There are no Patient Instructions on file for this visit. No follow-ups on file.   Kris Hartmann, NP

## 2022-05-22 DIAGNOSIS — I7092 Chronic total occlusion of artery of the extremities: Secondary | ICD-10-CM | POA: Diagnosis not present

## 2022-05-22 DIAGNOSIS — I69392 Facial weakness following cerebral infarction: Secondary | ICD-10-CM | POA: Diagnosis not present

## 2022-05-22 DIAGNOSIS — I70223 Atherosclerosis of native arteries of extremities with rest pain, bilateral legs: Secondary | ICD-10-CM | POA: Diagnosis not present

## 2022-05-22 DIAGNOSIS — E782 Mixed hyperlipidemia: Secondary | ICD-10-CM | POA: Diagnosis not present

## 2022-05-22 DIAGNOSIS — I70213 Atherosclerosis of native arteries of extremities with intermittent claudication, bilateral legs: Secondary | ICD-10-CM | POA: Diagnosis not present

## 2022-05-22 DIAGNOSIS — E1142 Type 2 diabetes mellitus with diabetic polyneuropathy: Secondary | ICD-10-CM | POA: Diagnosis not present

## 2022-05-22 DIAGNOSIS — Z4801 Encounter for change or removal of surgical wound dressing: Secondary | ICD-10-CM | POA: Diagnosis not present

## 2022-05-22 DIAGNOSIS — E1151 Type 2 diabetes mellitus with diabetic peripheral angiopathy without gangrene: Secondary | ICD-10-CM | POA: Diagnosis not present

## 2022-05-22 DIAGNOSIS — Z48812 Encounter for surgical aftercare following surgery on the circulatory system: Secondary | ICD-10-CM | POA: Diagnosis not present

## 2022-05-24 DIAGNOSIS — I69351 Hemiplegia and hemiparesis following cerebral infarction affecting right dominant side: Secondary | ICD-10-CM | POA: Diagnosis not present

## 2022-05-24 DIAGNOSIS — F17200 Nicotine dependence, unspecified, uncomplicated: Secondary | ICD-10-CM | POA: Diagnosis not present

## 2022-05-24 DIAGNOSIS — F419 Anxiety disorder, unspecified: Secondary | ICD-10-CM | POA: Diagnosis not present

## 2022-05-24 DIAGNOSIS — E1142 Type 2 diabetes mellitus with diabetic polyneuropathy: Secondary | ICD-10-CM | POA: Diagnosis not present

## 2022-05-24 DIAGNOSIS — E782 Mixed hyperlipidemia: Secondary | ICD-10-CM | POA: Diagnosis not present

## 2022-05-24 DIAGNOSIS — F4321 Adjustment disorder with depressed mood: Secondary | ICD-10-CM | POA: Diagnosis not present

## 2022-05-24 DIAGNOSIS — E1151 Type 2 diabetes mellitus with diabetic peripheral angiopathy without gangrene: Secondary | ICD-10-CM | POA: Diagnosis not present

## 2022-06-04 ENCOUNTER — Other Ambulatory Visit (HOSPITAL_BASED_OUTPATIENT_CLINIC_OR_DEPARTMENT_OTHER): Payer: Self-pay | Admitting: Osteopathic Medicine

## 2022-06-11 DIAGNOSIS — H353131 Nonexudative age-related macular degeneration, bilateral, early dry stage: Secondary | ICD-10-CM | POA: Diagnosis not present

## 2022-06-11 DIAGNOSIS — Z0100 Encounter for examination of eyes and vision without abnormal findings: Secondary | ICD-10-CM | POA: Diagnosis not present

## 2022-06-12 ENCOUNTER — Other Ambulatory Visit (INDEPENDENT_AMBULATORY_CARE_PROVIDER_SITE_OTHER): Payer: Self-pay | Admitting: Vascular Surgery

## 2022-06-12 DIAGNOSIS — I998 Other disorder of circulatory system: Secondary | ICD-10-CM

## 2022-06-12 DIAGNOSIS — Z95820 Peripheral vascular angioplasty status with implants and grafts: Secondary | ICD-10-CM

## 2022-06-13 ENCOUNTER — Ambulatory Visit (INDEPENDENT_AMBULATORY_CARE_PROVIDER_SITE_OTHER): Payer: Medicare HMO

## 2022-06-13 ENCOUNTER — Ambulatory Visit (INDEPENDENT_AMBULATORY_CARE_PROVIDER_SITE_OTHER): Payer: Medicare HMO | Admitting: Nurse Practitioner

## 2022-06-13 ENCOUNTER — Encounter (INDEPENDENT_AMBULATORY_CARE_PROVIDER_SITE_OTHER): Payer: Self-pay | Admitting: Nurse Practitioner

## 2022-06-13 VITALS — BP 126/70 | HR 87 | Resp 17 | Ht 60.0 in | Wt 129.8 lb

## 2022-06-13 DIAGNOSIS — I998 Other disorder of circulatory system: Secondary | ICD-10-CM | POA: Diagnosis not present

## 2022-06-13 DIAGNOSIS — E1169 Type 2 diabetes mellitus with other specified complication: Secondary | ICD-10-CM

## 2022-06-13 DIAGNOSIS — Z95820 Peripheral vascular angioplasty status with implants and grafts: Secondary | ICD-10-CM | POA: Diagnosis not present

## 2022-06-13 DIAGNOSIS — E782 Mixed hyperlipidemia: Secondary | ICD-10-CM

## 2022-06-13 DIAGNOSIS — I70222 Atherosclerosis of native arteries of extremities with rest pain, left leg: Secondary | ICD-10-CM

## 2022-06-13 DIAGNOSIS — E785 Hyperlipidemia, unspecified: Secondary | ICD-10-CM

## 2022-06-13 DIAGNOSIS — I6523 Occlusion and stenosis of bilateral carotid arteries: Secondary | ICD-10-CM

## 2022-06-15 ENCOUNTER — Telehealth (INDEPENDENT_AMBULATORY_CARE_PROVIDER_SITE_OTHER): Payer: Self-pay

## 2022-06-15 NOTE — Telephone Encounter (Signed)
Spoke with gail to let her know the leg will be first and necks a couple weeks after, she verbalized understanding that Mickel Baas will be calling next week to set up a time for surgery.

## 2022-06-15 NOTE — Telephone Encounter (Signed)
I talked to JD and he would like to do her leg first followed by the neck in a couple of weeks.

## 2022-06-15 NOTE — Telephone Encounter (Signed)
Baker Janus called with concerns about surgery that was being discussed between [facility] on Wednesday after pt's appt.  I let her know I would speak with Arna Medici and call her back.

## 2022-06-16 ENCOUNTER — Encounter (INDEPENDENT_AMBULATORY_CARE_PROVIDER_SITE_OTHER): Payer: Self-pay | Admitting: Nurse Practitioner

## 2022-06-16 NOTE — H&P (View-Only) (Signed)
Subjective:    Patient ID: Autumn Arnold, female    DOB: [birth year], 71 y.o.   MRN: [id number] No chief complaint on file.   KAYTELYN Autumn Arnold is a 71 y.o. female.  Patient returns in follow-up.  She continues to have numbness in her left foot and it is worsening somewhat.  She also continues to have numbness in the left inner thigh however this is normal post endarterectomy.  She had a left femoral endarterectomy.  She also has a known right common carotid artery occlusion as well as a high-grade stenosis at the subclavian artery.  We have been waiting for wounds to heal before dressing her carotid issues.  Her wound is mostly healed today with an area less than 2 cm.  She has had no further neurological events however the patient does have very poor memory and her sister helps.  Today noninvasive studies show an ABI of 0.92 on the right with an ABI 0.51 on the left.  Patient has a decrease in perfusion from the previous studies previous ABI was 0.92 on the right and 0.70 on the left.  Patient has monophasic tibial artery waveforms in the left lower extremity with biphasic in the right.       Review of Systems  Neurological:  Positive for numbness.  All other systems reviewed and are negative.      Objective:   Physical Exam Vitals reviewed.  HENT:     Head: Normocephalic.  Cardiovascular:     Rate and Rhythm: Normal rate.  Pulmonary:     Effort: Pulmonary effort is normal.  Musculoskeletal:        General: Tenderness present.  Skin:    General: Skin is warm and dry.  Neurological:     Mental Status: She is alert and oriented to person, place, and time.  Psychiatric:        Mood and Affect: Mood normal.        Behavior: Behavior normal.        Thought Content: Thought content normal.        Judgment: Judgment normal.     BP 126/70 (BP Location: Right Arm)   Pulse 87   Resp 17   Ht 5' (1.524 m)   Wt 129 lb 12.8 oz (58.9 kg)   BMI 25.35 kg/m   Past Medical History:   Diagnosis Date   Anxiety    Arthritis    Asthma    Bilateral lower extremity edema    COPD (chronic obstructive pulmonary disease) (HCC)    Cough    CHRONIC   Diabetes mellitus 2008   Edema    LEGS/FEET   GERD (gastroesophageal reflux disease)    Hyperlipidemia    Hypertension    Shortness of breath dyspnea    DOE   Sleep apnea    CPAP at [facility], Dr. Humphrey Rolls   Stroke [facility]) 02/2010   headache, left arm numbness   Wheezing     Social History   Socioeconomic History   Marital status: Married    Spouse name: Not on file   Number of children: Not on file   Years of education: Not on file   Highest education level: Not on file  Occupational History   Not on file  Tobacco Use   Smoking status: Every Day    Packs/day: 1.00    Types: Cigarettes   Smokeless tobacco: Never  Substance and Sexual Activity   Alcohol use: Yes    Alcohol/week:  7.0 standard drinks of alcohol    Types: 7 Cans of beer per week   Drug use: No   Sexual activity: Not on file  Other Topics Concern   Not on file  Social History Narrative   Not on file   Social Determinants of Health   Financial Resource Strain: Not on file  Food Insecurity: Not on file  Transportation Needs: Not on file  Physical Activity: Not on file  Stress: Not on file  Social Connections: Not on file  Intimate Partner Violence: Not on file    Past Surgical History:  Procedure Laterality Date   ABDOMINAL HYSTERECTOMY     ANTERIOR FUSION CERVICAL SPINE     CARPAL TUNNEL RELEASE     Bilateral   CATARACT EXTRACTION W/PHACO Left 03/12/2016   Procedure: CATARACT EXTRACTION PHACO AND INTRAOCULAR LENS PLACEMENT ([place]);  Surgeon: Estill Cotta, MD;  Location: [facility];  Service: Ophthalmology;  Laterality: Left;  [place] 01:12 AP% 23.4 CDE 29.83 fluid pack lot # [id number]   CATARACT EXTRACTION W/PHACO Right 04/16/2016   Procedure: CATARACT EXTRACTION PHACO AND INTRAOCULAR LENS PLACEMENT (I[facility]);  Surgeon: Estill Cotta, MD;   Location: [facility];  Service: Ophthalmology;  Laterality: Right;  [place] 01:17 AP% 22.5 CDE 35.08 Fluid pack lot # [id number]   CESAREAN SECTION     COLONOSCOPY WITH PROPOFOL N/A 08/12/2018   Procedure: COLONOSCOPY WITH PROPOFOL;  Surgeon: [place], Benay Pike, MD;  Location: [facility];  Service: Gastroenterology;  Laterality: N/A;   ELBOW SURGERY     Tendonitis   ENDARTERECTOMY FEMORAL Left 04/11/2022   Procedure: ENDARTERECTOMY FEMORAL;  Surgeon: Algernon Huxley, MD;  Location: [facility];  Service: Vascular;  Laterality: Left;   ESOPHAGOGASTRODUODENOSCOPY (EGD) WITH PROPOFOL N/A 08/12/2018   Procedure: ESOPHAGOGASTRODUODENOSCOPY (EGD) WITH PROPOFOL;  Surgeon: [place]edo, Benay Pike, MD;  Location: [facility];  Service: Gastroenterology;  Laterality: N/A;   INCONTINENCE SURGERY     INSERTION OF ILIAC STENT Left 04/11/2022   Procedure: INSERTION OF ILIAC STENT;  Surgeon: Algernon Huxley, MD;  Location: [facility];  Service: Vascular;  Laterality: Left;   LOWER EXTREMITY ANGIOGRAPHY Left 06/23/2021   Procedure: Lower Extremity Angiography;  Surgeon: Katha Cabal, MD;  Location: [place] [facility];  Service: Cardiovascular;  Laterality: Left;   LOWER EXTREMITY ANGIOGRAPHY Left 04/05/2022   Procedure: Lower Extremity Angiography;  Surgeon: Algernon Huxley, MD;  Location: [place] [facility];  Service: Cardiovascular;  Laterality: Left;   TONSILLECTOMY      Family History  Problem Relation Age of Onset   CAD Mother    Hypertension Mother    Arthritis Mother    Diabetes Father    CAD Father     Allergies  Allergen Reactions   Lisinopril Other (See Comments)    Tongue burning sensation   Plavix [Clopidogrel] Other (See Comments)    Pt denies allergy.       Latest Ref Rng & Units 04/21/2022    5:31 AM 04/19/2022    3:00 PM 04/13/2022    3:37 AM  CBC  WBC 4.0 - 10.5 K/uL 9.0  13.7  9.6   Hemoglobin 12.0 - 15.0 g/dL 8.3  9.6  10.0   Hematocrit 36.0 - 46.0 % 26.2  30.1  30.9   Platelets 150 - 400  K/uL 277  304  196       CMP     Component Value Date/Time   NA 143 04/22/2022 0532   K 4.7 04/22/2022 0532  CL 110 04/22/2022 0532   CO2 26 04/22/2022 0532   GLUCOSE 148 (H) 04/22/2022 0532   BUN 11 04/22/2022 0532   CREATININE 0.62 04/22/2022 0532   CALCIUM 8.7 (L) 04/22/2022 0532   PROT 6.9 04/20/2022 0736   ALBUMIN 3.1 (L) 04/20/2022 0736   AST 17 04/20/2022 0736   ALT 14 04/20/2022 0736   ALKPHOS 91 04/20/2022 0736   BILITOT 0.5 04/20/2022 0736   GFRNONAA >60 04/22/2022 0532   GFRAA >60 11/08/2015 0419     No results found.     Assessment & Plan:   1. Critical limb ischemia of left lower extremity (HCC) Recommend:  The patient has evidence of severe atherosclerotic changes of both lower extremities with rest pain that is associated with preulcerative changes and impending tissue loss of the left foot.  This represents a limb threatening ischemia and places the patient at the risk for left lower extremity limb loss.  Patient should undergo angiography of the left lower extremity with the hope for intervention for limb salvage.  The risks and benefits as well as the alternative therapies was discussed in detail with the patient.  All questions were answered.  Patient agrees to proceed with left lower extremity angiography.  The patient will follow up with me in the office after the procedure.       2. Bilateral carotid artery stenosis Once her left groin wound has healed, considering a right carotid endarterectomy with retrograde stenting of the right common carotid artery and potentially a right subclavian stent as well.  We will tentatively plan for intervention in 2 to 3 weeks following her lower extremity intervention  3. Type 2 diabetes mellitus with hyperlipidemia (HCC) Continue hypoglycemic medications as already ordered, these medications have been reviewed and there are no changes at this time.  Hgb A1C to be monitored as already arranged by primary  service   4. Mixed hyperlipidemia Continue statin as ordered and reviewed, no changes at this time    Current Outpatient Medications on File Prior to Visit  Medication Sig Dispense Refill   albuterol (VENTOLIN HFA) 108 (90 Base) MCG/ACT inhaler Inhale 2 puffs into the lungs as needed.     alendronate (FOSAMAX) 70 MG tablet Take 70 mg by mouth once a week. Take with a full glass of water on an empty stomach.     amitriptyline (ELAVIL) 150 MG tablet Take 150 mg by mouth daily. Reported on 12/20/2015     aspirin EC 81 MG EC tablet Take 1 tablet (81 mg total) by mouth daily. 30 tablet 2   atorvastatin (LIPITOR) 80 MG tablet Take 80 mg by mouth daily.     busPIRone (BUSPAR) 5 MG tablet Take 5 mg by mouth 2 (two) times daily.     Calcium Carb-Cholecalciferol 600-400 MG-UNIT TABS Take 1 tablet by mouth daily.     gabapentin (NEURONTIN) 600 MG tablet Take 1,200 mg by mouth 2 (two) times daily.     metFORMIN (GLUCOPHAGE) 1000 MG tablet Take 1,000 mg by mouth 2 (two) times daily.     metoprolol tartrate (LOPRESSOR) 25 MG tablet Take 0.5 tablets (12.5 mg total) by mouth 2 (two) times daily. 60 tablet 0   montelukast (SINGULAIR) 10 MG tablet Take 10 mg by mouth daily.     Multiple Vitamin (MULTIVITAMIN WITH MINERALS) TABS tablet Take 1 tablet by mouth daily. 30 tablet 0   nicotine (NICODERM CQ - DOSED IN MG/24 HOURS) 21 mg/24hr patch Place 1 patch (21 mg  total) onto the skin daily. 28 patch 0   pantoprazole (PROTONIX) 40 MG tablet Take 40 mg by mouth daily.     senna-docusate (SENOKOT-S) 8.6-50 MG tablet Take 2 tablets by mouth at bedtime as needed for mild constipation.     sertraline (ZOLOFT) 50 MG tablet Take 3 tablets (150 mg total) by mouth daily. 90 tablet 0   tiZANidine (ZANAFLEX) 4 MG tablet Take 4 mg by mouth 2 (two) times daily.     vitamin B-12 (CYANOCOBALAMIN) 1000 MCG tablet Take 1,000 mcg by mouth daily. Reported on 12/20/2015     clopidogrel (PLAVIX) 75 MG tablet Take 1 tablet (75 mg  total) by mouth daily. (Patient not taking: Reported on 02/26/2022) 30 tablet 2   oxyCODONE-acetaminophen (PERCOCET/ROXICET) 5-325 MG tablet Take 1-2 tablets by mouth every 4 (four) hours as needed for moderate pain. (Patient not taking: Reported on 05/03/2022) 30 tablet 0   sulfamethoxazole-trimethoprim (BACTRIM DS) 800-160 MG tablet Take 1 tablet by mouth 2 (two) times daily. (Patient not taking: Reported on 06/13/2022) 20 tablet 0   No current facility-administered medications on file prior to visit.    There are no Patient Instructions on file for this visit. No follow-ups on file.   Kris Hartmann, NP

## 2022-06-16 NOTE — H&P (View-Only) (Signed)
Subjective:    Patient ID: Autumn Arnold, female    DOB: 1951/03/01, 71 y.o.   MRN: [id number] No chief complaint on file.   Autumn Arnold is a 71 y.o. female.  Patient returns in follow-up.  She continues to have numbness in her left foot and it is worsening somewhat.  She also continues to have numbness in the left inner thigh however this is normal post endarterectomy.  She had a left femoral endarterectomy.  She also has a known right common carotid artery occlusion as well as a high-grade stenosis at the subclavian artery.  We have been waiting for wounds to heal before dressing her carotid issues.  Her wound is mostly healed today with an area less than 2 cm.  She has had no further neurological events however the patient does have very poor memory and her sister helps.  Today noninvasive studies show an ABI of 0.92 on the right with an ABI 0.51 on the left.  Patient has a decrease in perfusion from the previous studies previous ABI was 0.92 on the right and 0.70 on the left.  Patient has monophasic tibial artery waveforms in the left lower extremity with biphasic in the right.       Review of Systems  Neurological:  Positive for numbness.  All other systems reviewed and are negative.      Objective:   Physical Exam Vitals reviewed.  HENT:     Head: Normocephalic.  Cardiovascular:     Rate and Rhythm: Normal rate.  Pulmonary:     Effort: Pulmonary effort is normal.  Musculoskeletal:        General: Tenderness present.  Skin:    General: Skin is warm and dry.  Neurological:     Mental Status: She is alert and oriented to person, place, and time.  Psychiatric:        Mood and Affect: Mood normal.        Behavior: Behavior normal.        Thought Content: Thought content normal.        Judgment: Judgment normal.     BP 126/70 (BP Location: Right Arm)   Pulse 87   Resp 17   Ht 5' (1.524 m)   Wt 129 lb 12.8 oz (58.9 kg)   BMI 25.35 kg/m   Past Medical History:   Diagnosis Date   Anxiety    Arthritis    Asthma    Bilateral lower extremity edema    COPD (chronic obstructive pulmonary disease) (HCC)    Cough    CHRONIC   Diabetes mellitus 2008   Edema    LEGS/FEET   GERD (gastroesophageal reflux disease)    Hyperlipidemia    Hypertension    Shortness of breath dyspnea    DOE   Sleep apnea    [facility]AP at [facility], Dr. Humphrey Rolls   Stroke [facility]) 02/2010   headache, left arm numbness   Wheezing     Social History   Socioeconomic History   Marital status: Married    Spouse name: Not on file   Number of children: Not on file   Years of education: Not on file   Highest education level: Not on file  Occupational History   Not on file  Tobacco Use   Smoking status: Every Day    Packs/day: 1.00    Types: Cigarettes   Smokeless tobacco: Never  Substance and Sexual Activity   Alcohol use: Yes    Alcohol/week:  7.0 standard drinks of alcohol    Types: 7 Cans of beer per week   Drug use: No   Sexual activity: Not on file  Other Topics Concern   Not on file  Social History Narrative   Not on file   Social Determinants of Health   Financial Resource Strain: Not on file  Food Insecurity: Not on file  Transportation Needs: Not on file  Physical Activity: Not on file  Stress: Not on file  Social Connections: Not on file  Intimate Partner Violence: Not on file    Past Surgical History:  Procedure Laterality Date   ABDOMINAL HYSTERECTOMY     ANTERIOR FUSION CERVICAL SPINE     CARPAL TUNNEL RELEASE     Bilateral   CATARACT EXTRACTION W/PHACO Left 03/12/2016   Procedure: CATARACT EXTRACTION PHACO AND INTRAOCULAR LENS PLACEMENT ([place]);  Surgeon: Autumn Cotta, MD;  Location: [facility];  Service: Ophthalmology;  Laterality: Left;  [place] 01:12 AP% 23.4 CDE 29.83 fluid pack lot # [id number]   CATARACT EXTRACTION W/PHACO Right 04/16/2016   Procedure: CATARACT EXTRACTION PHACO AND INTRAOCULAR LENS PLACEMENT ([facility]);  Surgeon: Autumn Cotta, MD;   Location: [facility];  Service: Ophthalmology;  Laterality: Right;  [place] 01:17 AP% 22.5 CDE 35.08 Fluid pack lot # [id number]   CESAREAN SECTION     COLONOSCOPY WITH PROPOFOL N/A 08/12/2018   Procedure: COLONOSCOPY WITH PROPOFOL;  Surgeon: Arnold, Autumn Pike, MD;  Location: [facility];  Service: Gastroenterology;  Laterality: N/A;   ELBOW SURGERY     Tendonitis   ENDARTERECTOMY FEMORAL Left 04/11/2022   Procedure: ENDARTERECTOMY FEMORAL;  Surgeon: Autumn Huxley, MD;  Location: [facility];  Service: Vascular;  Laterality: Left;   ESOPHAGOGASTRODUODENOSCOPY (EGD) WITH PROPOFOL N/A 08/12/2018   Procedure: ESOPHAGOGASTRODUODENOSCOPY (EGD) WITH PROPOFOL;  Surgeon: Arnold, Autumn Pike, MD;  Location: [facility];  Service: Gastroenterology;  Laterality: N/A;   INCONTINENCE SURGERY     INSERTION OF ILIAC STENT Left 04/11/2022   Procedure: INSERTION OF ILIAC STENT;  Surgeon: Autumn Huxley, MD;  Location: [facility];  Service: Vascular;  Laterality: Left;   LOWER EXTREMITY ANGIOGRAPHY Left 06/23/2021   Procedure: Lower Extremity Angiography;  Surgeon: Autumn Cabal, MD;  Location: [place] [facility];  Service: Cardiovascular;  Laterality: Left;   LOWER EXTREMITY ANGIOGRAPHY Left 04/05/2022   Procedure: Lower Extremity Angiography;  Surgeon: Autumn Huxley, MD;  Location: [place] [facility];  Service: Cardiovascular;  Laterality: Left;   TONSILLECTOMY      Family History  Problem Relation Age of Onset   CAD Mother    Hypertension Mother    Arthritis Mother    Diabetes Father    CAD Father     Allergies  Allergen Reactions   Lisinopril Other (See Comments)    Tongue burning sensation   Plavix [Clopidogrel] Other (See Comments)    Pt denies allergy.       Latest Ref Rng & Units 04/21/2022    5:31 AM 04/19/2022    3:00 PM 04/13/2022    3:37 AM  CBC  WBC 4.0 - 10.5 K/uL 9.0  13.7  9.6   Hemoglobin 12.0 - 15.0 g/dL 8.3  9.6  10.0   Hematocrit 36.0 - 46.0 % 26.2  30.1  30.9   Platelets 150 - 400  K/uL 277  304  196       CMP     Component Value Date/Time   NA 143 04/22/2022 0532   K 4.7 04/22/2022 0532  CL 110 04/22/2022 0532   CO2 26 04/22/2022 0532   GLUCOSE 148 (H) 04/22/2022 0532   BUN 11 04/22/2022 0532   CREATININE 0.62 04/22/2022 0532   CALCIUM 8.7 (L) 04/22/2022 0532   PROT 6.9 04/20/2022 0736   ALBUMIN 3.1 (L) 04/20/2022 0736   AST 17 04/20/2022 0736   ALT 14 04/20/2022 0736   ALKPHOS 91 04/20/2022 0736   BILITOT 0.5 04/20/2022 0736   GFRNONAA >60 04/22/2022 0532   GFRAA >60 11/08/2015 0419     No results found.     Assessment & Plan:   1. Critical limb ischemia of left lower extremity (HCC) Recommend:  The patient has evidence of severe atherosclerotic changes of both lower extremities with rest pain that is associated with preulcerative changes and impending tissue loss of the left foot.  This represents a limb threatening ischemia and places the patient at the risk for left lower extremity limb loss.  Patient should undergo angiography of the left lower extremity with the hope for intervention for limb salvage.  The risks and benefits as well as the alternative therapies was discussed in detail with the patient.  All questions were answered.  Patient agrees to proceed with left lower extremity angiography.  The patient will follow up with me in the office after the procedure.       2. Bilateral carotid artery stenosis Once her left groin wound has healed, considering a right carotid endarterectomy with retrograde stenting of the right common carotid artery and potentially a right subclavian stent as well.  We will tentatively plan for intervention in 2 to 3 weeks following her lower extremity intervention  3. Type 2 diabetes mellitus with hyperlipidemia (HCC) Continue hypoglycemic medications as already ordered, these medications have been reviewed and there are no changes at this time.  Hgb A1C to be monitored as already arranged by primary  service   4. Mixed hyperlipidemia Continue statin as ordered and reviewed, no changes at this time    Current Outpatient Medications on File Prior to Visit  Medication Sig Dispense Refill   albuterol (VENTOLIN HFA) 108 (90 Base) MCG/ACT inhaler Inhale 2 puffs into the lungs as needed.     alendronate (FOSAMAX) 70 MG tablet Take 70 mg by mouth once a week. Take with a full glass of water on an empty stomach.     amitriptyline (ELAVIL) 150 MG tablet Take 150 mg by mouth daily. Reported on 12/20/2015     aspirin EC 81 MG EC tablet Take 1 tablet (81 mg total) by mouth daily. 30 tablet 2   atorvastatin (LIPITOR) 80 MG tablet Take 80 mg by mouth daily.     busPIRone (BUSPAR) 5 MG tablet Take 5 mg by mouth 2 (two) times daily.     Calcium Carb-Cholecalciferol 600-400 MG-UNIT TABS Take 1 tablet by mouth daily.     gabapentin (NEURONTIN) 600 MG tablet Take 1,200 mg by mouth 2 (two) times daily.     metFORMIN (GLUCOPHAGE) 1000 MG tablet Take 1,000 mg by mouth 2 (two) times daily.     metoprolol tartrate (LOPRESSOR) 25 MG tablet Take 0.5 tablets (12.5 mg total) by mouth 2 (two) times daily. 60 tablet 0   montelukast (SINGULAIR) 10 MG tablet Take 10 mg by mouth daily.     Multiple Vitamin (MULTIVITAMIN WITH MINERALS) TABS tablet Take 1 tablet by mouth daily. 30 tablet 0   nicotine (NICODERM CQ - DOSED IN MG/24 HOURS) 21 mg/24hr patch Place 1 patch (21 mg  total) onto the skin daily. 28 patch 0   pantoprazole (PROTONIX) 40 MG tablet Take 40 mg by mouth daily.     senna-docusate (SENOKOT-S) 8.6-50 MG tablet Take 2 tablets by mouth at bedtime as needed for mild constipation.     sertraline (ZOLOFT) 50 MG tablet Take 3 tablets (150 mg total) by mouth daily. 90 tablet 0   tiZANidine (ZANAFLEX) 4 MG tablet Take 4 mg by mouth 2 (two) times daily.     vitamin B-12 (CYANOCOBALAMIN) 1000 MCG tablet Take 1,000 mcg by mouth daily. Reported on 12/20/2015     clopidogrel (PLAVIX) 75 MG tablet Take 1 tablet (75 mg  total) by mouth daily. (Patient not taking: Reported on 02/26/2022) 30 tablet 2   oxyCODONE-acetaminophen (PERCOCET/ROXICET) 5-325 MG tablet Take 1-2 tablets by mouth every 4 (four) hours as needed for moderate pain. (Patient not taking: Reported on 05/03/2022) 30 tablet 0   sulfamethoxazole-trimethoprim (BACTRIM DS) 800-160 MG tablet Take 1 tablet by mouth 2 (two) times daily. (Patient not taking: Reported on 06/13/2022) 20 tablet 0   No current facility-administered medications on file prior to visit.    There are no Patient Instructions on file for this visit. No follow-ups on file.   Kris Hartmann, NP

## 2022-06-16 NOTE — Progress Notes (Signed)
Subjective:    Patient ID: Autumn Arnold, female    DOB: 1951/05/03, 71 y.o.   MRN: [id number] No chief complaint on file.   Autumn Arnold is a 71 y.o. female.  Patient returns in follow-up.  She continues to have numbness in her left foot and it is worsening somewhat.  She also continues to have numbness in the left inner thigh however this is normal post endarterectomy.  She had a left femoral endarterectomy.  She also has a known right common carotid artery occlusion as well as a high-grade stenosis at the subclavian artery.  We have been waiting for wounds to heal before dressing her carotid issues.  Her wound is mostly healed today with an area less than 2 cm.  She has had no further neurological events however the patient does have very poor memory and her sister helps.  Today noninvasive studies show an ABI of 0.92 on the right with an ABI 0.51 on the left.  Patient has a decrease in perfusion from the previous studies previous ABI was 0.92 on the right and 0.70 on the left.  Patient has monophasic tibial artery waveforms in the left lower extremity with biphasic in the right.       Review of Systems  Neurological:  Positive for numbness.  All other systems reviewed and are negative.      Objective:   Physical Exam Vitals reviewed.  HENT:     Head: Normocephalic.  Cardiovascular:     Rate and Rhythm: Normal rate.  Pulmonary:     Effort: Pulmonary effort is normal.  Musculoskeletal:        General: Tenderness present.  Skin:    General: Skin is warm and dry.  Neurological:     Mental Status: She is alert and oriented to person, place, and time.  Psychiatric:        Mood and Affect: Mood normal.        Behavior: Behavior normal.        Thought Content: Thought content normal.        Judgment: Judgment normal.     BP 126/70 (BP Location: Right Arm)   Pulse 87   Resp 17   Ht 5' (1.524 m)   Wt 129 lb 12.8 oz (58.9 kg)   BMI 25.35 kg/m   Past Medical History:   Diagnosis Date   Anxiety    Arthritis    Asthma    Bilateral lower extremity edema    COPD (chronic obstructive pulmonary disease) (HCC)    Cough    CHRONIC   Diabetes mellitus 2008   Edema    LEGS/FEET   GERD (gastroesophageal reflux disease)    Hyperlipidemia    Hypertension    Shortness of breath dyspnea    DOE   Sleep apnea    CPAP at [facility], Dr. Humphrey Rolls   Stroke [facility]) 02/2010   headache, left arm numbness   Wheezing     Social History   Socioeconomic History   Marital status: Married    Spouse name: Not on file   Number of children: Not on file   Years of education: Not on file   Highest education level: Not on file  Occupational History   Not on file  Tobacco Use   Smoking status: Every Day    Packs/day: 1.00    Types: Cigarettes   Smokeless tobacco: Never  Substance and Sexual Activity   Alcohol use: Yes    Alcohol/week:  7.0 standard drinks of alcohol    Types: 7 Cans of beer per week   Drug use: No   Sexual activity: Not on file  Other Topics Concern   Not on file  Social History Narrative   Not on file   Social Determinants of Health   Financial Resource Strain: Not on file  Food Insecurity: Not on file  Transportation Needs: Not on file  Physical Activity: Not on file  Stress: Not on file  Social Connections: Not on file  Intimate Partner Violence: Not on file    Past Surgical History:  Procedure Laterality Date   ABDOMINAL HYSTERECTOMY     ANTERIOR FUSION CERVICAL SPINE     CARPAL TUNNEL RELEASE     Bilateral   CATARACT EXTRACTION W/PHACO Left 03/12/2016   Procedure: CATARACT EXTRACTION PHACO AND INTRAOCULAR LENS PLACEMENT ([place]);  Surgeon: Estill Cotta, MD;  Location: [facility] OR[facility];  Service: Ophthalmology;  Laterality: Left;  [place] 01:12 AP% 23.4 CDE 29.83 fluid pack lot # [id number]   CATARACT EXTRACTION W/PHACO Right 04/16/2016   Procedure: CATARACT EXTRACTION PHACO AND INTRAOCULAR LENS PLACEMENT ([facility]);  Surgeon: Estill Cotta, MD;   Location: [facility];  Service: Ophthalmology;  Laterality: Right;  [place] 01:17 AP% 22.5 CDE 35.08 Fluid pack lot # [id number]   CESAREAN SECTION     COLONOSCOPY WITH PROPOFOL N/A 08/12/2018   Procedure: COLONOSCOPY WITH PROPOFOL;  Surgeon: Toledo, Benay Pike, MD;  Location: [facility];  Service: Gastroenterology;  Laterality: N/A;   ELBOW SURGERY     Tendonitis   ENDARTERECTOMY FEMORAL Left 04/11/2022   Procedure: ENDARTERECTOMY FEMORAL;  Surgeon: Algernon Huxley, MD;  Location: [facility];  Service: Vascular;  Laterality: Left;   ESOPHAGOGASTRODUODENOSCOPY (EGD) WITH PROPOFOL N/A 08/12/2018   Procedure: ESOPHAGOGASTRODUODENOSCOPY (EGD) WITH PROPOFOL;  Surgeon: Toledo, Benay Pike, MD;  Location: [facility];  Service: Gastroenterology;  Laterality: N/A;   INCONTINENCE SURGERY     INSERTION OF ILIAC STENT Left 04/11/2022   Procedure: INSERTION OF ILIAC STENT;  Surgeon: Algernon Huxley, MD;  Location: [facility];  Service: Vascular;  Laterality: Left;   LOWER EXTREMITY ANGIOGRAPHY Left 06/23/2021   Procedure: Lower Extremity Angiography;  Surgeon: Katha Cabal, MD;  Location: [place] [facility];  Service: Cardiovascular;  Laterality: Left;   LOWER EXTREMITY ANGIOGRAPHY Left 04/05/2022   Procedure: Lower Extremity Angiography;  Surgeon: Algernon Huxley, MD;  Location: [place] [facility];  Service: Cardiovascular;  Laterality: Left;   TONSILLECTOMY      Family History  Problem Relation Age of Onset   CAD Mother    Hypertension Mother    Arthritis Mother    Diabetes Father    CAD Father     Allergies  Allergen Reactions   Lisinopril Other (See Comments)    Tongue burning sensation   Plavix [Clopidogrel] Other (See Comments)    Pt denies allergy.       Latest Ref Rng & Units 04/21/2022    5:31 AM 04/19/2022    3:00 PM 04/13/2022    3:37 AM  CBC  WBC 4.0 - 10.5 K/uL 9.0  13.7  9.6   Hemoglobin 12.0 - 15.0 g/dL 8.3  9.6  10.0   Hematocrit 36.0 - 46.0 % 26.2  30.1  30.9   Platelets 150 - 400  K/uL 277  304  196       CMP     Component Value Date/Time   NA 143 04/22/2022 0532   K 4.7 04/22/2022 0532  CL 110 04/22/2022 0532   CO2 26 04/22/2022 0532   GLUCOSE 148 (H) 04/22/2022 0532   BUN 11 04/22/2022 0532   CREATININE 0.62 04/22/2022 0532   CALCIUM 8.7 (L) 04/22/2022 0532   PROT 6.9 04/20/2022 0736   ALBUMIN 3.1 (L) 04/20/2022 0736   AST 17 04/20/2022 0736   ALT 14 04/20/2022 0736   ALKPHOS 91 04/20/2022 0736   BILITOT 0.5 04/20/2022 0736   GFRNONAA >60 04/22/2022 0532   GFRAA >60 11/08/2015 0419     No results found.     Assessment & Plan:   1. Critical limb ischemia of left lower extremity (HCC) Recommend:  The patient has evidence of severe atherosclerotic changes of both lower extremities with rest pain that is associated with preulcerative changes and impending tissue loss of the left foot.  This represents a limb threatening ischemia and places the patient at the risk for left lower extremity limb loss.  Patient should undergo angiography of the left lower extremity with the hope for intervention for limb salvage.  The risks and benefits as well as the alternative therapies was discussed in detail with the patient.  All questions were answered.  Patient agrees to proceed with left lower extremity angiography.  The patient will follow up with me in the office after the procedure.       2. Bilateral carotid artery stenosis Once her left groin wound has healed, considering a right carotid endarterectomy with retrograde stenting of the right common carotid artery and potentially a right subclavian stent as well.  We will tentatively plan for intervention in 2 to 3 weeks following her lower extremity intervention  3. Type 2 diabetes mellitus with hyperlipidemia (HCC) Continue hypoglycemic medications as already ordered, these medications have been reviewed and there are no changes at this time.  Hgb A1C to be monitored as already arranged by primary  service   4. Mixed hyperlipidemia Continue statin as ordered and reviewed, no changes at this time    Current Outpatient Medications on File Prior to Visit  Medication Sig Dispense Refill   albuterol (VENTOLIN HFA) 108 (90 Base) MCG/ACT inhaler Inhale 2 puffs into the lungs as needed.     alendronate (FOSAMAX) 70 MG tablet Take 70 mg by mouth once a week. Take with a full glass of water on an empty stomach.     amitriptyline (ELAVIL) 150 MG tablet Take 150 mg by mouth daily. Reported on 12/20/2015     aspirin EC 81 MG EC tablet Take 1 tablet (81 mg total) by mouth daily. 30 tablet 2   atorvastatin (LIPITOR) 80 MG tablet Take 80 mg by mouth daily.     busPIRone (BUSPAR) 5 MG tablet Take 5 mg by mouth 2 (two) times daily.     Calcium Carb-Cholecalciferol 600-400 MG-UNIT TABS Take 1 tablet by mouth daily.     gabapentin (NEURONTIN) 600 MG tablet Take 1,200 mg by mouth 2 (two) times daily.     metFORMIN (GLUCOPHAGE) 1000 MG tablet Take 1,000 mg by mouth 2 (two) times daily.     metoprolol tartrate (LOPRESSOR) 25 MG tablet Take 0.5 tablets (12.5 mg total) by mouth 2 (two) times daily. 60 tablet 0   montelukast (SINGULAIR) 10 MG tablet Take 10 mg by mouth daily.     Multiple Vitamin (MULTIVITAMIN WITH MINERALS) TABS tablet Take 1 tablet by mouth daily. 30 tablet 0   nicotine (NICODERM CQ - DOSED IN MG/24 HOURS) 21 mg/24hr patch Place 1 patch (21 mg  total) onto the skin daily. 28 patch 0   pantoprazole (PROTONIX) 40 MG tablet Take 40 mg by mouth daily.     senna-docusate (SENOKOT-S) 8.6-50 MG tablet Take 2 tablets by mouth at bedtime as needed for mild constipation.     sertraline (ZOLOFT) 50 MG tablet Take 3 tablets (150 mg total) by mouth daily. 90 tablet 0   tiZANidine (ZANAFLEX) 4 MG tablet Take 4 mg by mouth 2 (two) times daily.     vitamin B-12 (CYANOCOBALAMIN) 1000 MCG tablet Take 1,000 mcg by mouth daily. Reported on 12/20/2015     clopidogrel (PLAVIX) 75 MG tablet Take 1 tablet (75 mg  total) by mouth daily. (Patient not taking: Reported on 02/26/2022) 30 tablet 2   oxyCODONE-acetaminophen (PERCOCET/ROXICET) 5-325 MG tablet Take 1-2 tablets by mouth every 4 (four) hours as needed for moderate pain. (Patient not taking: Reported on 05/03/2022) 30 tablet 0   sulfamethoxazole-trimethoprim (BACTRIM DS) 800-160 MG tablet Take 1 tablet by mouth 2 (two) times daily. (Patient not taking: Reported on 06/13/2022) 20 tablet 0   No current facility-administered medications on file prior to visit.    There are no Patient Instructions on file for this visit. No follow-ups on file.   Kris Hartmann, NP

## 2022-06-20 ENCOUNTER — Telehealth (INDEPENDENT_AMBULATORY_CARE_PROVIDER_SITE_OTHER): Payer: Self-pay

## 2022-06-20 NOTE — Telephone Encounter (Signed)
Spoke with the patient and her sister Edd Fabian regarding being scheduled with Dr. Candelaria Celeste for a LLE angio. Patient is scheduled on 06/25/22 with a 9:00 am arrival time to the MM. Pre-procedure instructions were discussed and will be mailed.

## 2022-06-25 ENCOUNTER — Other Ambulatory Visit: Payer: Self-pay

## 2022-06-25 ENCOUNTER — Ambulatory Visit
Admission: RE | Admit: 2022-06-25 | Discharge: 2022-06-25 | Disposition: A | Discharge status: Home / Self Care | Payer: Medicare HMO | Attending: Vascular Surgery | Admitting: Vascular Surgery

## 2022-06-25 ENCOUNTER — Encounter
Admission: RE | Disposition: A | Discharge status: Home / Self Care | Payer: Self-pay | Source: Home / Self Care | Attending: Vascular Surgery

## 2022-06-25 ENCOUNTER — Encounter: Payer: Self-pay | Admitting: Vascular Surgery

## 2022-06-25 DIAGNOSIS — Z7984 Long term (current) use of oral hypoglycemic drugs: Secondary | ICD-10-CM | POA: Insufficient documentation

## 2022-06-25 DIAGNOSIS — E782 Mixed hyperlipidemia: Secondary | ICD-10-CM | POA: Diagnosis not present

## 2022-06-25 DIAGNOSIS — I6523 Occlusion and stenosis of bilateral carotid arteries: Secondary | ICD-10-CM | POA: Diagnosis not present

## 2022-06-25 DIAGNOSIS — I70222 Atherosclerosis of native arteries of extremities with rest pain, left leg: Secondary | ICD-10-CM | POA: Diagnosis not present

## 2022-06-25 DIAGNOSIS — Z95828 Presence of other vascular implants and grafts: Secondary | ICD-10-CM

## 2022-06-25 DIAGNOSIS — Z9889 Other specified postprocedural states: Secondary | ICD-10-CM | POA: Diagnosis not present

## 2022-06-25 DIAGNOSIS — F17210 Nicotine dependence, cigarettes, uncomplicated: Secondary | ICD-10-CM | POA: Insufficient documentation

## 2022-06-25 DIAGNOSIS — E1151 Type 2 diabetes mellitus with diabetic peripheral angiopathy without gangrene: Secondary | ICD-10-CM | POA: Insufficient documentation

## 2022-06-25 HISTORY — PX: LOWER EXTREMITY ANGIOGRAPHY: CATH118251

## 2022-06-25 LAB — CREATININE, SERUM
Creatinine, Ser: 0.75 mg/dL (ref 0.44–1.00)
GFR, Estimated: >60 mL/min (ref >60)

## 2022-06-25 LAB — BUN: BUN: 13 mg/dL (ref 8–23)

## 2022-06-25 LAB — GLUCOSE, CAPILLARY: Glucose-Capillary: 134 mg/dL — ABNORMAL HIGH (ref 70–99)

## 2022-06-25 SURGERY — LOWER EXTREMITY ANGIOGRAPHY
Anesthesia: Moderate Sedation | Site: Leg Lower | Laterality: Left

## 2022-06-25 MED ORDER — FAMOTIDINE 20 MG PO TABS
40.0000 mg | ORAL_TABLET | Freq: Once | ORAL | Status: DC | PRN
Start: 2022-06-25 — End: 2022-06-25

## 2022-06-25 MED ORDER — DIPHENHYDRAMINE HCL 50 MG/ML IJ SOLN: 50.0000 mg | Freq: Once | INTRAMUSCULAR | Status: DC | PRN

## 2022-06-25 MED ORDER — METHYLPREDNISOLONE SODIUM SUCC 125 MG IJ SOLR: 125.0000 mg | Freq: Once | INTRAMUSCULAR | Status: DC | PRN

## 2022-06-25 MED ORDER — FENTANYL CITRATE (PF) 100 MCG/2ML IJ SOLN
INTRAMUSCULAR | Status: AC
  Filled 2022-06-25: qty 2

## 2022-06-25 MED ORDER — HYDROMORPHONE HCL 1 MG/ML IJ SOLN: 1.0000 mg | Freq: Once | INTRAMUSCULAR | Status: DC | PRN

## 2022-06-25 MED ORDER — MIDAZOLAM HCL 2 MG/2ML IJ SOLN
INTRAMUSCULAR | Status: AC
  Filled 2022-06-25: qty 4

## 2022-06-25 MED ORDER — HEPARIN SODIUM (PORCINE) 1000 UNIT/ML IJ SOLN
INTRAMUSCULAR | Status: DC | PRN
  Administered 2022-06-25: 4000 [IU] via INTRAVENOUS

## 2022-06-25 MED ORDER — CEFAZOLIN SODIUM-DEXTROSE 2-4 GM/100ML-% IV SOLN
2.0000 g | INTRAVENOUS | Status: AC
Start: 2022-06-25 — End: 2022-06-25

## 2022-06-25 MED ORDER — SODIUM CHLORIDE 0.9 % IV SOLN: INTRAVENOUS | Status: DC

## 2022-06-25 MED ORDER — MIDAZOLAM HCL 2 MG/ML PO SYRP: 8.0000 mg | ORAL_SOLUTION | Freq: Once | ORAL | Status: DC | PRN

## 2022-06-25 MED ORDER — FENTANYL CITRATE (PF) 100 MCG/2ML IJ SOLN
INTRAMUSCULAR | Status: DC | PRN
  Administered 2022-06-25: 50 ug via INTRAVENOUS

## 2022-06-25 MED ORDER — CEFAZOLIN SODIUM-DEXTROSE 2-4 GM/100ML-% IV SOLN
INTRAVENOUS | Status: AC
  Administered 2022-06-25: 2 g via INTRAVENOUS
  Filled 2022-06-25: qty 100

## 2022-06-25 MED ORDER — HEPARIN SODIUM (PORCINE) 1000 UNIT/ML IJ SOLN
INTRAMUSCULAR | Status: AC
  Filled 2022-06-25: qty 10

## 2022-06-25 MED ORDER — SODIUM CHLORIDE 0.9 % IV BOLUS
INTRAVENOUS | Status: DC | PRN
  Administered 2022-06-25: 250 mL via INTRAVENOUS

## 2022-06-25 MED ORDER — ONDANSETRON HCL 4 MG/2ML IJ SOLN
4.0000 mg | Freq: Four times a day (QID) | INTRAMUSCULAR | Status: DC | PRN
Start: 2022-06-25 — End: 2022-06-25

## 2022-06-25 MED ORDER — MIDAZOLAM HCL 2 MG/2ML IJ SOLN
INTRAMUSCULAR | Status: DC | PRN
  Administered 2022-06-25: 2 mg via INTRAVENOUS

## 2022-06-25 SURGICAL SUPPLY — 22 items
BALLN LUTONIX 018 5X80X130 (BALLOONS) ×1
BALLN ULTRV 018 5X60X75 (BALLOONS) ×1
BALLN VIATRAC 5X40X135 (BALLOONS) ×1
BALLOON LUTONIX 018 5X80X130 (BALLOONS) IMPLANT
BALLOON ULTRV 018 5X60X75 (BALLOONS) IMPLANT
BALLOON VIATRAC 5X40X135 (BALLOONS) IMPLANT
CATH ANGIO 5F PIGTAIL 65CM (CATHETERS) IMPLANT
CATH TEMPO 5F RIM 65CM (CATHETERS) IMPLANT
CATH VS15FR (CATHETERS) IMPLANT
COVER DRAPE FLUORO 36X44 (DRAPES) IMPLANT
DEVICE STARCLOSE SE CLOSURE (Vascular Products) IMPLANT
GLIDEWIRE ADV .014X300CM (WIRE) IMPLANT
GLIDEWIRE ADV .035X260CM (WIRE) IMPLANT
GUIDEWIRE ADV .018X180CM (WIRE) IMPLANT
GUIDEWIRE PFTE-COATED .018X300 (WIRE) IMPLANT
KIT ENCORE 26 ADVANTAGE (KITS) IMPLANT
PACK ANGIOGRAPHY (CUSTOM PROCEDURE TRAY) ×2 IMPLANT
SHEATH BRITE TIP 5FRX11 (SHEATH) IMPLANT
SHEATH PROBE COVER 6X72 (BAG) IMPLANT
SYR MEDRAD MARK 7 150ML (SYRINGE) IMPLANT
TUBING CONTRAST HIGH PRESS 72 (TUBING) IMPLANT
WIRE GUIDERIGHT .035X150 (WIRE) IMPLANT

## 2022-06-25 NOTE — Interval H&P Note (Signed)
History and Physical Interval Note:  06/25/2022 9:54 AM  Autumn Arnold  has presented today for surgery, with the diagnosis of LLE Angio   BARD   Critical limb ischemia.  The various methods of treatment have been discussed with the patient and family. After consideration of risks, benefits and other options for treatment, the patient has consented to  Procedure(s): Lower Extremity Angiography (Left) as a surgical intervention.  The patient's history has been reviewed, patient examined, no change in status, stable for surgery.  I have reviewed the patient's chart and labs.  Questions were answered to the patient's satisfaction.     [name]

## 2022-06-25 NOTE — Op Note (Signed)
[facility]  Percutaneous Study/Intervention Procedural Note   Date of Surgery: 06/25/2022  Surgeon(s):Jeremian Whitby    Assistants:none  Pre-operative Diagnosis: PAD with rest pain LLE  Post-operative diagnosis:  Same  Procedure(s) Performed:             1.  Ultrasound guidance for vascular access right femoral artery             2.  Catheter placement into left iliac artery from right femoral approach             3.  Aortogram and selective left lower extremity angiogram             4.  StarClose closure device right femoral artery  EBL: 5 cc  Contrast: 40 cc  Fluoro Time: 15.9 minutes  Moderate Conscious Sedation Time: approximately 44 minutes using 2 mg of Versed and 50 mcg of Fentanyl              Indications:  Patient is a 71 y.o.female with rest pain of the left foot. The patient has noninvasive study showing a reduced ABI in the 0.5 range on the left. The patient is brought in for angiography for further evaluation and potential treatment.  Due to the limb threatening nature of the situation, angiogram was performed for attempted limb salvage. The patient is aware that if the procedure fails, amputation would be expected.  The patient also understands that even with successful revascularization, amputation may still be required due to the severity of the situation.  Risks and benefits are discussed and informed consent is obtained.   Procedure:  The patient was identified and appropriate procedural time out was performed.  The patient was then placed supine on the table and prepped and draped in the usual sterile fashion. Moderate conscious sedation was administered during a face to face encounter with the patient throughout the procedure with my supervision of the RN administering medicines and monitoring the patient's vital signs, pulse oximetry, telemetry and mental status throughout from the start of the procedure until the patient was taken to the  recovery room. Ultrasound was used to evaluate the right common femoral artery.  It was patent .  A digital ultrasound image was acquired.  A Seldinger needle was used to access the right common femoral artery under direct ultrasound guidance and a permanent image was performed.  A 0.035 J wire was advanced without resistance and a 5Fr sheath was placed.  Pigtail catheter was placed into the aorta and an AP aortogram was performed. This demonstrated normal renal arteries and the aorta and iliac arteries which had been previously stented now had no significant recurrent stenosis but this created an extremely steep aortic bifurcation. I then used a VS 1 catheter to hook the stents that created the new aortic bifurcation and cannulate the left iliac segment.  Selective left lower extremity imaging was then performed.  This demonstrated the left common femoral artery and profunda femoris artery endarterectomies were widely patent.  A few centimeters below the SFA origin likely below the previous endarterectomy had about a 75 to 80% stenosis.  The remainder of the SFA was patent down to a popliteal artery occlusion with occlusion of all 3 tibial vessels proximally with reconstitution of the peroneal and the anterior tibial artery after proximal occlusions which were then continuous distally.  I was able to pass a 0.018 advantage wire across the proximal SFA stenosis and at hope to treat this area at least but  was clear we would not get an up and over sheath to be able to address the popliteal occlusion.  I can get a wire down to the popliteal artery, but a variety of balloons including 5 mm diameter Lutonix 0.018 balloon, conventional 0.018 balloon, and a 5 mm diameter 0.014 rapid exchange balloon were tried to cross the steep bifurcation but we could never cross to get down to treat the left lower extremity.  Any intervention left total be considered from a pedal approach at this point.  I elected to terminate the  procedure. The sheath was removed and StarClose closure device was deployed in the right femoral artery with excellent hemostatic result. The patient was taken to the recovery room in stable condition having tolerated the procedure well.  Findings:               Aortogram:  This demonstrated normal renal arteries and the aorta and iliac arteries which had been previously stented now had no significant recurrent stenosis but this created an extremely steep aortic bifurcation.             Left lower Extremity:  This demonstrated the left common femoral artery and profunda femoris artery endarterectomies were widely patent.  A few centimeters below the SFA origin likely below the previous endarterectomy had about a 75 to 80% stenosis.  The remainder of the SFA was patent down to a popliteal artery occlusion with occlusion of all 3 tibial vessels proximally with reconstitution of the peroneal and the anterior tibial artery after proximal occlusions which were then continuous distally.   Disposition: Patient was taken to the recovery room in stable condition having tolerated the procedure well.  Complications: None  [name] 06/25/2022 11:44 AM   This note was created with Dragon Medical transcription system. Any errors in dictation are purely unintentional.

## 2022-06-26 ENCOUNTER — Encounter: Payer: Self-pay | Admitting: Vascular Surgery

## 2022-07-01 ENCOUNTER — Other Ambulatory Visit: Payer: Self-pay | Admitting: Internal Medicine

## 2022-07-01 NOTE — Progress Notes (Unsigned)
{  Select_[id number]} 

## 2022-07-05 ENCOUNTER — Telehealth (INDEPENDENT_AMBULATORY_CARE_PROVIDER_SITE_OTHER): Payer: Self-pay

## 2022-07-05 NOTE — Telephone Encounter (Signed)
Spoke with the patient and she is scheduled with Dr. Lucky Cowboy on 09/06/236 for a left leg angio with pedal access and a 7:30 am arrival time to the MM. Pre-procedure instructions were discussed and will be mailed.

## 2022-07-10 ENCOUNTER — Ambulatory Visit (INDEPENDENT_AMBULATORY_CARE_PROVIDER_SITE_OTHER): Payer: Medicare HMO | Admitting: Nurse Practitioner

## 2022-07-11 ENCOUNTER — Other Ambulatory Visit: Payer: Self-pay

## 2022-07-11 ENCOUNTER — Telehealth (INDEPENDENT_AMBULATORY_CARE_PROVIDER_SITE_OTHER): Payer: Self-pay

## 2022-07-11 ENCOUNTER — Ambulatory Visit
Admission: RE | Admit: 2022-07-11 | Discharge: 2022-07-11 | Disposition: A | Discharge status: Home / Self Care | Payer: Medicare HMO | Attending: Vascular Surgery | Admitting: Vascular Surgery

## 2022-07-11 ENCOUNTER — Encounter: Payer: Self-pay | Admitting: Vascular Surgery

## 2022-07-11 ENCOUNTER — Encounter
Admission: RE | Disposition: A | Discharge status: Home / Self Care | Payer: Self-pay | Source: Home / Self Care | Attending: Vascular Surgery

## 2022-07-11 DIAGNOSIS — Z9889 Other specified postprocedural states: Secondary | ICD-10-CM

## 2022-07-11 DIAGNOSIS — I739 Peripheral vascular disease, unspecified: Secondary | ICD-10-CM

## 2022-07-11 DIAGNOSIS — Z95828 Presence of other vascular implants and grafts: Secondary | ICD-10-CM

## 2022-07-11 DIAGNOSIS — I70222 Atherosclerosis of native arteries of extremities with rest pain, left leg: Secondary | ICD-10-CM | POA: Diagnosis not present

## 2022-07-11 DIAGNOSIS — E782 Mixed hyperlipidemia: Secondary | ICD-10-CM | POA: Insufficient documentation

## 2022-07-11 DIAGNOSIS — Z7984 Long term (current) use of oral hypoglycemic drugs: Secondary | ICD-10-CM | POA: Diagnosis not present

## 2022-07-11 DIAGNOSIS — E1151 Type 2 diabetes mellitus with diabetic peripheral angiopathy without gangrene: Secondary | ICD-10-CM | POA: Diagnosis not present

## 2022-07-11 DIAGNOSIS — F17210 Nicotine dependence, cigarettes, uncomplicated: Secondary | ICD-10-CM | POA: Diagnosis not present

## 2022-07-11 DIAGNOSIS — I6523 Occlusion and stenosis of bilateral carotid arteries: Secondary | ICD-10-CM | POA: Diagnosis not present

## 2022-07-11 HISTORY — PX: LOWER EXTREMITY ANGIOGRAPHY: CATH118251

## 2022-07-11 LAB — BUN: BUN: 19 mg/dL (ref 8–23)

## 2022-07-11 LAB — CREATININE, SERUM
Creatinine, Ser: 0.82 mg/dL (ref 0.44–1.00)
GFR, Estimated: >60 mL/min (ref >60)

## 2022-07-11 LAB — GLUCOSE, CAPILLARY: Glucose-Capillary: 142 mg/dL — ABNORMAL HIGH (ref 70–99)

## 2022-07-11 SURGERY — LOWER EXTREMITY ANGIOGRAPHY
Anesthesia: Moderate Sedation | Site: Leg Lower | Laterality: Left

## 2022-07-11 MED ORDER — DIPHENHYDRAMINE HCL 50 MG/ML IJ SOLN
50.0000 mg | Freq: Once | INTRAMUSCULAR | Status: DC | PRN
Start: 2022-07-11 — End: 2022-07-11

## 2022-07-11 MED ORDER — HYDROMORPHONE HCL 1 MG/ML IJ SOLN
1.0000 mg | Freq: Once | INTRAMUSCULAR | Status: AC | PRN
  Administered 2022-07-11: 0.5 mg via INTRAVENOUS

## 2022-07-11 MED ORDER — CLOPIDOGREL BISULFATE 75 MG PO TABS: 75.0000 mg | ORAL_TABLET | Freq: Every day | ORAL | 2 refills | Status: DC

## 2022-07-11 MED ORDER — SODIUM CHLORIDE 0.9 % IV SOLN: INTRAVENOUS | Status: DC

## 2022-07-11 MED ORDER — HYDRALAZINE HCL 20 MG/ML IJ SOLN: 5.0000 mg | INTRAMUSCULAR | Status: DC | PRN

## 2022-07-11 MED ORDER — ACETAMINOPHEN 325 MG PO TABS: 650.0000 mg | ORAL_TABLET | ORAL | Status: DC | PRN

## 2022-07-11 MED ORDER — FENTANYL CITRATE (PF) 100 MCG/2ML IJ SOLN
INTRAMUSCULAR | Status: AC
  Administered 2022-07-11: 50 ug
  Filled 2022-07-11: qty 2

## 2022-07-11 MED ORDER — CEFAZOLIN SODIUM-DEXTROSE 2-4 GM/100ML-% IV SOLN
INTRAVENOUS | Status: AC
  Administered 2022-07-11: 2 g via INTRAVENOUS
  Filled 2022-07-11: qty 100

## 2022-07-11 MED ORDER — ONDANSETRON HCL 4 MG/2ML IJ SOLN: 4.0000 mg | Freq: Four times a day (QID) | INTRAMUSCULAR | Status: DC | PRN

## 2022-07-11 MED ORDER — METHYLPREDNISOLONE SODIUM SUCC 125 MG IJ SOLR: 125.0000 mg | Freq: Once | INTRAMUSCULAR | Status: DC | PRN

## 2022-07-11 MED ORDER — HYDROMORPHONE HCL 1 MG/ML IJ SOLN
INTRAMUSCULAR | Status: AC
  Administered 2022-07-11: 0.5 mg via INTRAVENOUS
  Filled 2022-07-11: qty 1

## 2022-07-11 MED ORDER — CLOPIDOGREL BISULFATE 75 MG PO TABS: 75.0000 mg | ORAL_TABLET | Freq: Every day | ORAL | Status: DC

## 2022-07-11 MED ORDER — CEFAZOLIN SODIUM-DEXTROSE 2-4 GM/100ML-% IV SOLN: 2.0000 g | INTRAVENOUS | Status: AC

## 2022-07-11 MED ORDER — MIDAZOLAM HCL 2 MG/2ML IJ SOLN
INTRAMUSCULAR | Status: AC
  Administered 2022-07-11: 1 mg
  Filled 2022-07-11: qty 2

## 2022-07-11 MED ORDER — LABETALOL HCL 5 MG/ML IV SOLN: 10.0000 mg | INTRAVENOUS | Status: DC | PRN

## 2022-07-11 MED ORDER — HEPARIN SODIUM (PORCINE) 1000 UNIT/ML IJ SOLN
INTRAMUSCULAR | Status: AC
  Administered 2022-07-11: 4000 [IU]
  Filled 2022-07-11: qty 10

## 2022-07-11 MED ORDER — SODIUM CHLORIDE 0.9% FLUSH: 3.0000 mL | INTRAVENOUS | Status: DC | PRN

## 2022-07-11 MED ORDER — MIDAZOLAM HCL 2 MG/ML PO SYRP: 8.0000 mg | ORAL_SOLUTION | Freq: Once | ORAL | Status: DC | PRN

## 2022-07-11 MED ORDER — ONDANSETRON HCL 4 MG/2ML IJ SOLN
4.0000 mg | Freq: Four times a day (QID) | INTRAMUSCULAR | Status: DC | PRN
Start: 2022-07-11 — End: 2022-07-11

## 2022-07-11 MED ORDER — FAMOTIDINE 20 MG PO TABS: 40.0000 mg | ORAL_TABLET | Freq: Once | ORAL | Status: DC | PRN

## 2022-07-11 MED ORDER — SODIUM CHLORIDE 0.9% FLUSH: 3.0000 mL | Freq: Two times a day (BID) | INTRAVENOUS | Status: DC

## 2022-07-11 MED ORDER — IODIXANOL 320 MG/ML IV SOLN
INTRAVENOUS | Status: DC | PRN
  Administered 2022-07-11: 40 mL

## 2022-07-11 MED ORDER — MIDAZOLAM HCL 2 MG/2ML IJ SOLN
INTRAMUSCULAR | Status: DC | PRN
  Administered 2022-07-11 (×2): .5 mg via INTRAVENOUS

## 2022-07-11 MED ORDER — SODIUM CHLORIDE 0.9 % IV SOLN
250.0000 mL | INTRAVENOUS | Status: DC | PRN
Start: 2022-07-11 — End: 2022-07-11

## 2022-07-11 MED ORDER — FENTANYL CITRATE (PF) 100 MCG/2ML IJ SOLN
INTRAMUSCULAR | Status: DC | PRN
  Administered 2022-07-11 (×2): 25 ug via INTRAVENOUS

## 2022-07-11 SURGICAL SUPPLY — 31 items
BALLN LUTONIX 018 5X60X130 (BALLOONS) ×1
BALLN LUTONIX 018 5X80X130 (BALLOONS) ×1
BALLN ULTRV 018 3X100X75 (BALLOONS) ×1
BALLN ULTRVRSE 3X220X150 (BALLOONS) ×1
BALLOON LUTONIX 018 5X60X130 (BALLOONS) IMPLANT
BALLOON LUTONIX 018 5X80X130 (BALLOONS) IMPLANT
BALLOON ULTRV 018 3X100X75 (BALLOONS) IMPLANT
BALLOON ULTRVRSE 3X220X150 (BALLOONS) IMPLANT
BAND CMPR LRG ZPHR (HEMOSTASIS) ×1
BAND ZEPHYR COMPRESS 30 LONG (HEMOSTASIS) IMPLANT
CATH BEACON 5 .035 40 KMP TP (CATHETERS) IMPLANT
CATH BEACON 5 .035 65 KMP TIP (CATHETERS) IMPLANT
CATH BEACON 5 .038 40 KMP TP (CATHETERS) ×1
COVER DRAPE FLUORO 36X44 (DRAPES) IMPLANT
DRAPE INCISE 23X17 IOBAN STRL (DRAPES) ×1
DRAPE INCISE 23X17 STRL (DRAPES) IMPLANT
DRAPE INCISE IOBAN 23X17 STRL (DRAPES) ×1 IMPLANT
DRAPE TABLE BACK 80X90 (DRAPES) IMPLANT
GLIDEWIRE ADV .035X260CM (WIRE) IMPLANT
KIT ENCORE 26 ADVANTAGE (KITS) IMPLANT
NDL ENTRY 21GA 7CM ECHOTIP (NEEDLE) IMPLANT
NEEDLE ENTRY 21GA 7CM ECHOTIP (NEEDLE) ×1 IMPLANT
PACK ANGIOGRAPHY (CUSTOM PROCEDURE TRAY) ×2 IMPLANT
SHEATH BRITE TIP 5FRX11 (SHEATH) IMPLANT
SHEATH HALO 035 6FRX10 (SHEATH) IMPLANT
SHEATH MICROPUNCTURE PEDAL 4FR (SHEATH) IMPLANT
SHEATH PROBE COVER 6X72 (BAG) IMPLANT
STENT VIABAHN 6X7.5X120 (Permanent Stent) IMPLANT
WIRE G 018X200 V18 (WIRE) IMPLANT
WIRE G V18X300CM (WIRE) IMPLANT
WIRE GUIDERIGHT .035X150 (WIRE) IMPLANT

## 2022-07-11 NOTE — Progress Notes (Signed)
Pt. C/o severe "8"/10 foot/ankle pain with TR band device on. States pain "annoying, throbbing" at site. Pt. Med. With 2nd 0.5 mg Dilaudid slow IVP for c/o pain.

## 2022-07-11 NOTE — Op Note (Signed)
[facility]  Percutaneous Study/Intervention Procedural Note   Date of Surgery: 07/11/2022  Surgeon(s):Saavi Mceachron    Assistants:none  Pre-operative Diagnosis: PAD with rest pain left lower extremity  Post-operative diagnosis:  Same  Procedure(s) Performed:             1.  Ultrasound guidance for vascular access left anterior tibial artery             2.  Catheter placement into left common femoral artery from left anterior tibial artery             3.  Selective left lower extremity angiogram             4.  Percutaneous transluminal angioplasty of left anterior tibial artery and distal popliteal artery with 3 mm diameter angioplasty balloon             5.  Percutaneous transluminal angioplasty of left superficial femoral artery with 5 mm diameter Lutonix drug-coated angioplasty balloon  6.  Stent placement to the proximal left SFA with 6 mm diameter by 7.5 cm length Viabahn stent             7.  Percutaneous transluminal angioplasty of the left tibioperoneal trunk and proximal posterior tibial artery with 3 mm diameter angioplasty balloon  EBL: 10 cc  Contrast: 40 cc  Fluoro Time: 5.7 minutes  Moderate Conscious Sedation Time: approximately 43 minutes using 2 mg of Versed and 100 mcg of Fentanyl              Indications:  Patient is a 71 y.o.female with known severe peripheral arterial disease with previous aortoiliac intervention and femoral endarterectomy. The patient has noninvasive study showing reduced flow and a previous angiogram showing disease that cannot be approached from an up and over approach due to her previous rebuilding of aortic bifurcation with stents. The patient is brought in for angiography for further evaluation and potential treatment from a pedal approach.  Due to the limb threatening nature of the situation, angiogram was performed for attempted limb salvage. The patient is aware that if the procedure fails, amputation would be expected.   The patient also understands that even with successful revascularization, amputation may still be required due to the severity of the situation. Risks and benefits are discussed and informed consent is obtained.   Procedure:  The patient was identified and appropriate procedural time out was performed.  The patient was then placed supine on the table and prepped and draped in the usual sterile fashion. Moderate conscious sedation was administered during a face to face encounter with the patient throughout the procedure with my supervision of the RN administering medicines and monitoring the patient's vital signs, pulse oximetry, telemetry and mental status throughout from the start of the procedure until the patient was taken to the recovery room. Ultrasound was used to evaluate the left anterior artery.  It was patent .  A digital ultrasound image was acquired.  A micropuncture needle was used to access the left anterior tibial artery at the level of the ankle under direct ultrasound guidance and a permanent image was performed.  A micropuncture wire and sheath were then placed.  A 0.035 J wire was advanced without resistance and a 6Fr halo sheath was placed.  Imaging through the sheath showed the occlusion of the proximal anterior tibial artery in the distal posterior tibial artery with reconstitution of both the posterior tibial and peroneal arteries after proximal occlusions although the distal posterior  tibial artery was also occluded.  Using a Kumpe catheter and an advantage wire was able to cross this occlusion without difficulty and confirm intraluminal flow in the mid popliteal artery.  I then advanced up to the most proximal SFA and selective imaging of the left lower extremity showed the high-grade stenosis in the proximal SFA a few centimeters below the origin and previous endarterectomy site.  The remainder of the SFA down to the popliteal artery had mild disease but nothing hemodynamically  significant.  It was felt that it was in the patient's best interest to proceed with intervention after these images to avoid a second procedure and a larger amount of contrast and fluoroscopy based off of the findings from the initial angiogram.  I then placed a V18 wire.  A 3 mm diameter by 22 cm length angioplasty balloon was inflated in the proximal anterior tibial artery and distal popliteal artery and taken up to 10 atm for 1 minute.  A 5 mm diameter by 6 cm length Lutonix drug-coated angioplasty balloon was used to treat the proximal SFA and inflated to 14 atm for 1 minute.  The proximal SFA still had a greater than 50% residual stenosis with 6 mm diameter by 7.5 cm length Viabahn stent was then deployed and postdilated with a 5 mm balloon with excellent angiographic completion result and less than 10% residual stenosis.  There is less than 25% residual stenosis in the anterior tibial artery.  Using the Kumpe catheter and the advantage wire, I then accessed the posterior tibial artery from the anterior tibial artery by going up and through the occlusion of the tibioperoneal trunk and the proximal portion of the posterior tibial artery.  Intraluminal flow was confirmed with a Kumpe catheter and then I replaced the V18 wire.  A 3 mm diameter by 10 cm length angioplasty balloon were then used to treat the tibioperoneal trunk and proximal posterior tibial artery inflated to 8 atm for 1 minute.  Completion imaging following this showed less than 30% residual stenosis in the tibioperoneal trunk and proximal posterior tibial artery although the more distal posterior tibial artery remained occluded without distal reconstitution.  This should help with collateral flow through both the peroneal and posterior tibial arteries and now her anterior tibial arteries and circuit so she was markedly improved.  I elected to terminate the procedure. The sheath was removed and TR band placed at the access site with excellent  hemostatic result. The patient was taken to the recovery room in stable condition having tolerated the procedure well.  Findings:                          Left Lower Extremity: Proximal 80% stenosis in the proximal left SFA.  Occlusion of the mid to distal popliteal artery and the origins of all 3 tibial vessels with occlusion of the tibioperoneal trunk as well.   Disposition: Patient was taken to the recovery room in stable condition having tolerated the procedure well.  Complications: None  [name] 07/11/2022 9:55 AM   This note was created with Dragon Medical transcription system. Any errors in dictation are purely unintentional.

## 2022-07-11 NOTE — Interval H&P Note (Signed)
History and Physical Interval Note:  07/11/2022 8:32 AM  Autumn Arnold  has presented today for surgery, with the diagnosis of LLE Angio w pedal access    Critical limb ischemia.  The various methods of treatment have been discussed with the patient and family. After consideration of risks, benefits and other options for treatment, the patient has consented to  Procedure(s): Lower Extremity Angiography (Left) as a surgical intervention.  The patient's history has been reviewed, patient examined, no change in status, stable for surgery.  I have reviewed the patient's chart and labs.  Questions were answered to the patient's satisfaction.     [name]

## 2022-07-11 NOTE — Progress Notes (Signed)
Dr. Lucky Cowboy came by bedside in recovery & spoke with pt. And her sister Madaline Savage re: results from PPI. Both verbalized understanding of conversation and follow-up.

## 2022-07-11 NOTE — Telephone Encounter (Signed)
Patient left a message requesting for pain medication. The patient had left le angio on today. I have left a message on [facility] voicemail for Tramadol '50mg'$  every 6 hours prn #30. Patient has been made aware that medication has been called into pharmacy.

## 2022-07-12 ENCOUNTER — Telehealth (INDEPENDENT_AMBULATORY_CARE_PROVIDER_SITE_OTHER): Payer: Self-pay

## 2022-07-12 ENCOUNTER — Encounter: Payer: Self-pay | Admitting: Vascular Surgery

## 2022-07-12 NOTE — Telephone Encounter (Signed)
She was sent in Tramadol when she called yesterday.  We can't give her what she was given for her procedure because you have to be in the hospital to get that.  She can take up to 2 tramadol

## 2022-07-12 NOTE — Telephone Encounter (Signed)
Patient called in wanting pain medication. The patient is stating that she was not able to sleep last night, patient states she is only having that problem and would like what Dr. Lucky Cowboy gave her yesterday for her procedure. Please advise.

## 2022-07-12 NOTE — Telephone Encounter (Signed)
Spoke with the patient and gave her the recommendations from Eulogio Ditch NP. See notes below.

## 2022-07-13 ENCOUNTER — Encounter: Payer: Self-pay | Admitting: Vascular Surgery

## 2022-07-16 DIAGNOSIS — I70213 Atherosclerosis of native arteries of extremities with intermittent claudication, bilateral legs: Secondary | ICD-10-CM | POA: Diagnosis not present

## 2022-07-16 DIAGNOSIS — I69351 Hemiplegia and hemiparesis following cerebral infarction affecting right dominant side: Secondary | ICD-10-CM | POA: Diagnosis not present

## 2022-07-16 DIAGNOSIS — Z48812 Encounter for surgical aftercare following surgery on the circulatory system: Secondary | ICD-10-CM | POA: Diagnosis not present

## 2022-07-16 DIAGNOSIS — I69319 Unspecified symptoms and signs involving cognitive functions following cerebral infarction: Secondary | ICD-10-CM | POA: Diagnosis not present

## 2022-07-16 DIAGNOSIS — I70223 Atherosclerosis of native arteries of extremities with rest pain, bilateral legs: Secondary | ICD-10-CM | POA: Diagnosis not present

## 2022-07-16 DIAGNOSIS — I69354 Hemiplegia and hemiparesis following cerebral infarction affecting left non-dominant side: Secondary | ICD-10-CM | POA: Diagnosis not present

## 2022-07-16 DIAGNOSIS — I10 Essential (primary) hypertension: Secondary | ICD-10-CM | POA: Diagnosis not present

## 2022-07-16 DIAGNOSIS — I69392 Facial weakness following cerebral infarction: Secondary | ICD-10-CM | POA: Diagnosis not present

## 2022-07-16 DIAGNOSIS — E1151 Type 2 diabetes mellitus with diabetic peripheral angiopathy without gangrene: Secondary | ICD-10-CM | POA: Diagnosis not present

## 2022-07-18 ENCOUNTER — Telehealth (INDEPENDENT_AMBULATORY_CARE_PROVIDER_SITE_OTHER): Payer: Self-pay

## 2022-07-18 NOTE — Telephone Encounter (Signed)
Patient called in stating that she is having left leg pain going up the leg from her foot with numbness, pin and needles feeling as well. Patient had a left leg angio on 06/25/22 and 07/12/22 same leg with Dr. Lucky Cowboy. Please advise. Thank you

## 2022-07-19 NOTE — Telephone Encounter (Signed)
Is this the same pain she was having prior to the procedure, or is it worse or is this an all new pain/numbness

## 2022-07-23 ENCOUNTER — Other Ambulatory Visit (INDEPENDENT_AMBULATORY_CARE_PROVIDER_SITE_OTHER): Payer: Self-pay | Admitting: Vascular Surgery

## 2022-07-23 DIAGNOSIS — Z95820 Peripheral vascular angioplasty status with implants and grafts: Secondary | ICD-10-CM

## 2022-07-23 DIAGNOSIS — I70222 Atherosclerosis of native arteries of extremities with rest pain, left leg: Secondary | ICD-10-CM

## 2022-07-24 ENCOUNTER — Ambulatory Visit (INDEPENDENT_AMBULATORY_CARE_PROVIDER_SITE_OTHER): Payer: Medicare HMO

## 2022-07-24 DIAGNOSIS — I70222 Atherosclerosis of native arteries of extremities with rest pain, left leg: Secondary | ICD-10-CM

## 2022-07-24 DIAGNOSIS — Z95820 Peripheral vascular angioplasty status with implants and grafts: Secondary | ICD-10-CM

## 2022-07-30 ENCOUNTER — Encounter (INDEPENDENT_AMBULATORY_CARE_PROVIDER_SITE_OTHER): Payer: Self-pay | Admitting: Nurse Practitioner

## 2022-07-30 ENCOUNTER — Encounter (INDEPENDENT_AMBULATORY_CARE_PROVIDER_SITE_OTHER): Payer: Medicare HMO

## 2022-07-30 ENCOUNTER — Ambulatory Visit (INDEPENDENT_AMBULATORY_CARE_PROVIDER_SITE_OTHER): Payer: Medicare HMO | Admitting: Nurse Practitioner

## 2022-07-30 VITALS — BP 111/66 | HR 87 | Resp 18 | Ht 60.0 in | Wt 133.4 lb

## 2022-07-30 DIAGNOSIS — E782 Mixed hyperlipidemia: Secondary | ICD-10-CM | POA: Diagnosis not present

## 2022-07-30 DIAGNOSIS — E785 Hyperlipidemia, unspecified: Secondary | ICD-10-CM | POA: Diagnosis not present

## 2022-07-30 DIAGNOSIS — Z87891 Personal history of nicotine dependence: Secondary | ICD-10-CM | POA: Diagnosis not present

## 2022-07-30 DIAGNOSIS — E1169 Type 2 diabetes mellitus with other specified complication: Secondary | ICD-10-CM

## 2022-07-30 DIAGNOSIS — I70222 Atherosclerosis of native arteries of extremities with rest pain, left leg: Secondary | ICD-10-CM | POA: Diagnosis not present

## 2022-07-30 DIAGNOSIS — I6523 Occlusion and stenosis of bilateral carotid arteries: Secondary | ICD-10-CM | POA: Diagnosis not present

## 2022-07-30 NOTE — Progress Notes (Signed)
Subjective:    Patient ID: Autumn Arnold, female    DOB: Sep 05, 1951, 71 y.o.   MRN: [id number] No chief complaint on file.   The patient returns to the office for followup and review status post angiogram with intervention on 07/11/2022.   Procedure: Procedure(s) Performed:             1.  Ultrasound guidance for vascular access left anterior tibial artery             2.  Catheter placement into left common femoral artery from left anterior tibial artery             3.  Selective left lower extremity angiogram             4.  Percutaneous transluminal angioplasty of left anterior tibial artery and distal popliteal artery with 3 mm diameter angioplasty balloon             5.  Percutaneous transluminal angioplasty of left superficial femoral artery with 5 mm diameter Lutonix drug-coated angioplasty balloon             6.  Stent placement to the proximal left SFA with 6 mm diameter by 7.5 cm length Viabahn stent             7.  Percutaneous transluminal angioplasty of the left tibioperoneal trunk and proximal posterior tibial artery with 3 mm diameter angioplasty balloon  The patient notes improvement in the lower extremity symptoms. No interval shortening of the patient's claudication distance or rest pain symptoms. No new ulcers or wounds have occurred since the last visit.  She still has some numbness in her left thigh area post femoral endarterectomy but it has improved somewhat.  She also has a known right common carotid artery occlusion and right subclavian artery high-grade stenosis that we are potentially planning to address with right carotid endarterectomy and right common carotid artery stent placement as well as potential subclavian intervention as well.  However because the patient had critical limb ischemia in the left lower extremity we felt it was best to intervene prior to moving forward with carotid intervention.  No documented history of amaurosis fugax or recent TIA symptoms.  There are no recent neurological changes noted. No documented history of DVT, PE or superficial thrombophlebitis. The patient denies recent episodes of angina or shortness of breath.   ABI's Rt=1.04 and Lt=1.01  (previous ABI's Rt=0.92 and Lt=0.51) Duplex US of the patient has primarily triphasic waveforms throughout the bilateral lower extremities with good toe waveforms bilaterally    Review of Systems  Cardiovascular:  Negative for leg swelling.  Neurological:  Positive for numbness.  All other systems reviewed and are negative.      Objective:   Physical Exam Vitals reviewed.  HENT:     Head: Normocephalic.  Cardiovascular:     Rate and Rhythm: Normal rate.     Pulses:          Dorsalis pedis pulses are detected w/ Doppler on the right side and 2+ on the left side.       Posterior tibial pulses are detected w/ Doppler on the right side and 1+ on the left side.  Pulmonary:     Effort: Pulmonary effort is normal.  Skin:    General: Skin is warm and dry.  Neurological:     Mental Status: She is alert and oriented to person, place, and time.  Psychiatric:        Mood  and Affect: Mood normal.        Thought Content: Thought content normal.        Judgment: Judgment normal.     BP 111/66 (BP Location: Right Arm)   Pulse 87   Resp 18   Ht 5' (1.524 m)   Wt 133 lb 6.4 oz (60.5 kg)   BMI 26.05 kg/m   Past Medical History:  Diagnosis Date   Anxiety    Arthritis    Asthma    Bilateral lower extremity edema    COPD (chronic obstructive pulmonary disease) (HCC)    Cough    CHRONIC   Diabetes mellitus 2008   Edema    LEGS/FEET   GERD (gastroesophageal reflux disease)    Hyperlipidemia    Hypertension    Shortness of breath dyspnea    DOE   Sleep apnea    CPAP at [facility], Dr. Humphrey Rolls   Stroke [facility]) 02/2010   headache, left arm numbness   Wheezing     Social History   Socioeconomic History   Marital status: Married    Spouse name: Marcello Moores   Number of children:  2   Years of education: Not on file   Highest education level: Not on file  Occupational History   Not on file  Tobacco Use   Smoking status: Every Day    Packs/day: 1.00    Types: Cigarettes   Smokeless tobacco: Never  Vaping Use   Vaping Use: Never used  Substance and Sexual Activity   Alcohol use: Not Currently    Comment: occ   Drug use: No   Sexual activity: Not on file  Other Topics Concern   Not on file  Social History Narrative   Lives at home with husband; sister Madaline Savage lives in the area    Social Determinants of Health   Financial Resource Strain: Not on file  Food Insecurity: Not on file  Transportation Needs: Not on file  Physical Activity: Not on file  Stress: Not on file  Social Connections: Not on file  Intimate Partner Violence: Not on file    Past Surgical History:  Procedure Laterality Date   [place]     Bilateral   CATARACT EXTRACTION W/PHACO Left 03/12/2016   Procedure: CATARACT EXTRACTION PHACO AND INTRAOCULAR LENS PLACEMENT ([place]);  Surgeon: Estill Cotta, MD;  Location: [facility];  Service: Ophthalmology;  Laterality: Left;  [place] 01:12 AP% 23.4 CDE 29.83 fluid pack lot # [id number]   CATARACT EXTRACTION W/PHACO Right 04/16/2016   Procedure: CATARACT EXTRACTION PHACO AND INTRAOCULAR LENS PLACEMENT ([facility]);  Surgeon: Estill Cotta, MD;  Location: [facility];  Service: Ophthalmology;  Laterality: Right;  [place] 01:17 AP% 22.5 CDE 35.08 Fluid pack lot # [id number]   CESAREAN SECTION     COLONOSCOPY WITH PROPOFOL N/A 08/12/2018   Procedure: COLONOSCOPY WITH PROPOFOL;  Surgeon: Toledo, Benay Pike, MD;  Location: [facility];  Service: Gastroenterology;  Laterality: N/A;   ELBOW SURGERY     Tendonitis   ENDARTERECTOMY FEMORAL Left 04/11/2022   Procedure: ENDARTERECTOMY FEMORAL;  Surgeon: Algernon Huxley, MD;  Location: [facility];  Service: Vascular;  Laterality: Left;    ESOPHAGOGASTRODUODENOSCOPY (EGD) WITH PROPOFOL N/A 08/12/2018   Procedure: ESOPHAGOGASTRODUODENOSCOPY (EGD) WITH PROPOFOL;  Surgeon: Toledo, Benay Pike, MD;  Location: [facility];  Service: Gastroenterology;  Laterality: N/A;   INCONTINENCE SURGERY     INSERTION OF ILIAC STENT  Left 04/11/2022   Procedure: INSERTION OF ILIAC STENT;  Surgeon: Algernon Huxley, MD;  Location: [facility];  Service: Vascular;  Laterality: Left;   LOWER EXTREMITY ANGIOGRAPHY Left 06/23/2021   Procedure: Lower Extremity Angiography;  Surgeon: Katha Cabal, MD;  Location: [place] [facility];  Service: Cardiovascular;  Laterality: Left;   LOWER EXTREMITY ANGIOGRAPHY Left 04/05/2022   Procedure: Lower Extremity Angiography;  Surgeon: Algernon Huxley, MD;  Location: [place] [facility];  Service: Cardiovascular;  Laterality: Left;   LOWER EXTREMITY ANGIOGRAPHY Left 06/25/2022   Procedure: Lower Extremity Angiography;  Surgeon: Algernon Huxley, MD;  Location: [place] [facility];  Service: Cardiovascular;  Laterality: Left;   LOWER EXTREMITY ANGIOGRAPHY Left 07/11/2022   Procedure: Lower Extremity Angiography;  Surgeon: Algernon Huxley, MD;  Location: [place] [facility];  Service: Cardiovascular;  Laterality: Left;   TONSILLECTOMY      Family History  Problem Relation Age of Onset   CAD Mother    Hypertension Mother    Arthritis Mother    Diabetes Father    CAD Father     Allergies  Allergen Reactions   Lisinopril Other (See Comments)    Tongue burning sensation   Plavix [Clopidogrel] Other (See Comments)    Pt denies allergy.       Latest Ref Rng & Units 04/21/2022    5:31 AM 04/19/2022    3:00 PM 04/13/2022    3:37 AM  CBC  WBC 4.0 - 10.5 K/uL 9.0  13.7  9.6   Hemoglobin 12.0 - 15.0 g/dL 8.3  9.6  10.0   Hematocrit 36.0 - 46.0 % 26.2  30.1  30.9   Platelets 150 - 400 K/uL 277  304  196       CMP     Component Value Date/Time   NA 143 04/22/2022 0532   K 4.7 04/22/2022 0532   CL 110 04/22/2022 0532   CO2  26 04/22/2022 0532   GLUCOSE 148 (H) 04/22/2022 0532   BUN 19 07/11/2022 0759   CREATININE 0.82 07/11/2022 0759   CALCIUM 8.7 (L) 04/22/2022 0532   PROT 6.9 04/20/2022 0736   ALBUMIN 3.1 (L) 04/20/2022 0736   AST 17 04/20/2022 0736   ALT 14 04/20/2022 0736   ALKPHOS 91 04/20/2022 0736   BILITOT 0.5 04/20/2022 0736   GFRNONAA >60 07/11/2022 0759   GFRAA >60 11/08/2015 0419     VAS [place] ABI WITH/WO TBI  Result Date: 07/27/2022  LOWER EXTREMITY DOPPLER STUDY Patient Name:  TYJAH HAI  Date of Exam:   07/24/2022 Medical Rec #: [id number]       Accession #:    [id number] Date of Birth: 11-21-1950        Patient Gender: F Patient Age:   74 years Exam Location:  [facility] Procedure:      VAS [place] ABI WITH/WO TBI Referring Phys: --------------------------------------------------------------------------------  Indications: Peripheral artery disease.  Vascular Interventions: 04/11/2022: Left CFA,PFA and SFA Endartectomies and                         Patch Angioplasty. Catheter placement into Aorta from                         Bilateral Femoral approaches. US guidance for Vascular  access Right Femoral Artery. Stent placement to                         Bilateral CIA with 6 mm diameter by 37 cm length                         Lifestream stent on the Right and 6 mm diameter by 58 mm                         length lifestream stent on the Left. Angioplasty of the                         Infrarenal Abdominal Aorta with 6 mm balloons from both                         sides. Additional stent placement to the Left EIA with a                         pair of 6 mm diameter by 58 mm length lifestream stents                         and a 7 mm diameter by 50 mm length Viabahn stent.                         07/11/2022 Percutaneous transluminal angioplasty of left                         anterior tibial artery and distal popliteal artery with                         3 mm diameter angioplasty  balloon                         5. Percutaneous transluminal angioplasty of left                         superficial femoral artery with 5 mm diameter Lutonix                         drug-coated angioplasty balloon                         6. Stent placement to the proximal left SFA with 6 mm                         diameter by 7.5 cm length Viabahn stent                         7. Percutaneous transluminal angioplasty of the left                         tibioperoneal trunk and proximal posterior tibial artery                         with 3 mm diameter angioplasty balloon. Comparison Study: 06/18/2022 Performing Technologist: Concha Norway RVT  Examination Guidelines: A complete evaluation  includes at minimum, Doppler waveform signals and systolic blood pressure reading at the level of bilateral brachial, anterior tibial, and posterior tibial arteries, when vessel segments are accessible. Bilateral testing is considered an integral part of a complete examination. Photoelectric Plethysmograph (PPG) waveforms and toe systolic pressure readings are included as required and additional duplex testing as needed. Limited examinations for reoccurring indications may be performed as noted.  ABI Findings: +---------+------------------+-----+---------+--------+ Right    Rt Pressure (mmHg)IndexWaveform Comment  +---------+------------------+-----+---------+--------+ Brachial 147                                      +---------+------------------+-----+---------+--------+ ATA      128               0.87 triphasic         +---------+------------------+-----+---------+--------+ PTA      153               1.04 triphasic         +---------+------------------+-----+---------+--------+ Great Toe105               0.71 Abnormal          +---------+------------------+-----+---------+--------+ +---------+------------------+-----+---------+-------+ Left     Lt Pressure (mmHg)IndexWaveform Comment  +---------+------------------+-----+---------+-------+ Brachial 147                                     +---------+------------------+-----+---------+-------+ ATA      139               0.95 triphasic        +---------+------------------+-----+---------+-------+ PTA      149               1.01 biphasic         +---------+------------------+-----+---------+-------+ Great Toe105               0.71 Normal           +---------+------------------+-----+---------+-------+ +-------+-----------+-----------+------------+------------+ ABI/TBIToday's ABIToday's TBIPrevious ABIPrevious TBI +-------+-----------+-----------+------------+------------+ Right  1.04       .71        .92         .60          +-------+-----------+-----------+------------+------------+ Left   1.01       .71        .51         .47          +-------+-----------+-----------+------------+------------+ Left ABIs and TBIs appear increased compared to prior study on 06/18/2022.  Summary: Right: Resting right ankle-brachial index is within normal range. The right toe-brachial index is normal. Left: Resting left ankle-brachial index is within normal range. The left toe-brachial index is normal. Sugnificantly improved s/p intervention. *See table(s) above for measurements and observations.  Electronically signed by [name] MD on 07/27/2022 at 10:59:05 AM.    Final    VAS [place] ABI WITH/WO TBI  Result Date: 06/18/2022  LOWER EXTREMITY DOPPLER STUDY Patient Name:  GWENEVERE GOGA  Date of Exam:   06/13/2022 Medical Rec #: [id number]       Accession #:    [id number] Date of Birth: [birth year]        Patient Gender: F Patient Age:   80 years Exam Location:  [facility] Procedure:      VAS [place] ABI WITH/WO TBI Referring Phys: [name] --------------------------------------------------------------------------------  Indications: Peripheral artery disease.  Vascular Interventions: 04/11/2022: Left CFA,PFA and SFA  Endartectomies and                         Patch Angioplasty. Catheter placement into Aorta from                         Bilateral Femoral approaches. US guidance for Vascular                         access Right Femoral Artery. Stent placement to                         Bilateral CIA with 6 mm diameter by 37 cm length                         Lifestream stent on the Right and 6 mm diameter by 58 mm                         length lifestream stent on the Left. Angioplasty of the                         Infrarenal Abdominal Aorta with 6 mm balloons from both                         sides. Additional stent placement to the Left EIA with a                         pair of 6 mm diameter by 58 mm length lifestream stents                         and a 7 mm diameter by 50 mm length Viabahn stent. Comparison Study: 07/17/2021 Performing Technologist: Almira Coaster RVS  Examination Guidelines: A complete evaluation includes at minimum, Doppler waveform signals and systolic blood pressure reading at the level of bilateral brachial, anterior tibial, and posterior tibial arteries, when vessel segments are accessible. Bilateral testing is considered an integral part of a complete examination. Photoelectric Plethysmograph (PPG) waveforms and toe systolic pressure readings are included as required and additional duplex testing as needed. Limited examinations for reoccurring indications may be performed as noted.  ABI Findings: +---------+------------------+-----+--------+--------+ Right    Rt Pressure (mmHg)IndexWaveformComment  +---------+------------------+-----+--------+--------+ Brachial 146                                     +---------+------------------+-----+--------+--------+ ATA      131               0.89 biphasic         +---------+------------------+-----+--------+--------+ PTA      135               0.92 biphasic         +---------+------------------+-----+--------+--------+ Great Toe88                 0.60 Abnormal         +---------+------------------+-----+--------+--------+ +---------+------------------+-----+----------+-------+ Left     Lt Pressure (mmHg)IndexWaveform  Comment +---------+------------------+-----+----------+-------+ Brachial 147                                      +---------+------------------+-----+----------+-------+  ATA      75                0.51 monophasic        +---------+------------------+-----+----------+-------+ PTA      70                0.48 monophasic        +---------+------------------+-----+----------+-------+ Great Toe69                0.47 Abnormal          +---------+------------------+-----+----------+-------+ +-------+-----------+-----------+------------+------------+ ABI/TBIToday's ABIToday's TBIPrevious ABIPrevious TBI +-------+-----------+-----------+------------+------------+ Right  .92        .60        .92         .53          +-------+-----------+-----------+------------+------------+ Left   .51        .47        .70         .40          +-------+-----------+-----------+------------+------------+ Right ABIs appear essentially unchanged compared to prior study on 07/17/2021. Bilateral TBIs appear increased compared to prior study on 07/17/2021. Left ABIs appear to be decreased compared to prior study on 07/17/2021.  Summary: Right: Resting right ankle-brachial index indicates mild right lower extremity arterial disease. The right toe-brachial index is abnormal. Left: Resting left ankle-brachial index indicates moderate left lower extremity arterial disease. The left toe-brachial index is abnormal. *See table(s) above for measurements and observations.   Electronically signed by [name] MD on 06/18/2022 at 2:03:27 PM.    Final        Assessment & Plan:   1. Atherosclerosis of native artery of left leg with rest pain ([place]) Recommend:  The patient is status post successful angiogram with  intervention.  The patient reports that the claudication symptoms and leg pain has improved.   The patient denies lifestyle limiting changes at this point in time.  No further invasive studies, angiography or surgery at this time The patient should continue walking and begin a more formal exercise program.  The patient should continue antiplatelet therapy and aggressive treatment of the lipid abnormalities  Continued surveillance is indicated as atherosclerosis is likely to progress with time.    Patient should undergo noninvasive studies as ordered. The patient will follow up with me to review the studies.    2. Bilateral carotid artery stenosis Once her left groin wound has healed, considering a right carotid endarterectomy with retrograde stenting of the right common carotid artery and potentially a right subclavian stent as well.  We will tentatively plan for intervention in 2 to 3 weeks following her lower extremity intervention  3. Type 2 diabetes mellitus with hyperlipidemia (HCC) Continue hypoglycemic medications as already ordered, these medications have been reviewed and there are no changes at this time.  Hgb A1C to be monitored as already arranged by primary service   4. Mixed hyperlipidemia Continue statin as ordered and reviewed, no changes at this time   5. Smoking history Smoking cessation was discussed, 3-10 minutes spent on this topic specifically    Current Outpatient Medications on File Prior to Visit  Medication Sig Dispense Refill   albuterol (VENTOLIN HFA) 108 (90 Base) MCG/ACT inhaler Inhale 2 puffs into the lungs as needed.     alendronate (FOSAMAX) 70 MG tablet Take 70 mg by mouth once a week. Take with a full glass of water on an empty stomach.     amitriptyline (ELAVIL) 150  MG tablet Take 150 mg by mouth daily. Reported on 12/20/2015     aspirin EC 81 MG EC tablet Take 1 tablet (81 mg total) by mouth daily. 30 tablet 2   atorvastatin (LIPITOR) 80 MG tablet  Take 80 mg by mouth daily.     busPIRone (BUSPAR) 5 MG tablet Take 5 mg by mouth 2 (two) times daily.     Calcium Carb-Cholecalciferol 600-400 MG-UNIT TABS Take 1 tablet by mouth daily.     clopidogrel (PLAVIX) 75 MG tablet Take 1 tablet (75 mg total) by mouth daily. 30 tablet 2   gabapentin (NEURONTIN) 600 MG tablet Take 1,200 mg by mouth 2 (two) times daily.     metFORMIN (GLUCOPHAGE) 1000 MG tablet Take 1,000 mg by mouth 2 (two) times daily.     metoprolol tartrate (LOPRESSOR) 25 MG tablet Take 0.5 tablets (12.5 mg total) by mouth 2 (two) times daily. 60 tablet 0   montelukast (SINGULAIR) 10 MG tablet Take 10 mg by mouth daily.     Multiple Vitamin (MULTIVITAMIN WITH MINERALS) TABS tablet Take 1 tablet by mouth daily. 30 tablet 0   pantoprazole (PROTONIX) 40 MG tablet Take 40 mg by mouth daily.     senna-docusate (SENOKOT-S) 8.6-50 MG tablet Take 2 tablets by mouth at bedtime as needed for mild constipation.     sertraline (ZOLOFT) 50 MG tablet Take 3 tablets (150 mg total) by mouth daily. 90 tablet 0   tiZANidine (ZANAFLEX) 4 MG tablet Take 4 mg by mouth 2 (two) times daily.     vitamin B-12 (CYANOCOBALAMIN) 1000 MCG tablet Take 1,000 mcg by mouth daily. Reported on 12/20/2015     No current facility-administered medications on file prior to visit.    There are no Patient Instructions on file for this visit. No follow-ups on file.   Kris Hartmann, NP

## 2022-07-31 ENCOUNTER — Ambulatory Visit: Payer: Medicare HMO | Attending: Cardiology | Admitting: Cardiology

## 2022-08-31 ENCOUNTER — Ambulatory Visit: Payer: Medicare HMO | Admitting: Cardiology

## 2022-09-03 ENCOUNTER — Encounter (INDEPENDENT_AMBULATORY_CARE_PROVIDER_SITE_OTHER): Payer: Self-pay

## 2022-09-05 DIAGNOSIS — I739 Peripheral vascular disease, unspecified: Secondary | ICD-10-CM | POA: Diagnosis not present

## 2022-09-05 DIAGNOSIS — E1142 Type 2 diabetes mellitus with diabetic polyneuropathy: Secondary | ICD-10-CM | POA: Diagnosis not present

## 2022-09-05 DIAGNOSIS — E782 Mixed hyperlipidemia: Secondary | ICD-10-CM | POA: Diagnosis not present

## 2022-09-06 DIAGNOSIS — E1151 Type 2 diabetes mellitus with diabetic peripheral angiopathy without gangrene: Secondary | ICD-10-CM | POA: Diagnosis not present

## 2022-09-06 DIAGNOSIS — D638 Anemia in other chronic diseases classified elsewhere: Secondary | ICD-10-CM | POA: Diagnosis not present

## 2022-09-06 DIAGNOSIS — F17210 Nicotine dependence, cigarettes, uncomplicated: Secondary | ICD-10-CM | POA: Diagnosis not present

## 2022-09-06 DIAGNOSIS — E782 Mixed hyperlipidemia: Secondary | ICD-10-CM | POA: Diagnosis not present

## 2022-09-06 DIAGNOSIS — I69351 Hemiplegia and hemiparesis following cerebral infarction affecting right dominant side: Secondary | ICD-10-CM | POA: Diagnosis not present

## 2022-09-06 DIAGNOSIS — F4321 Adjustment disorder with depressed mood: Secondary | ICD-10-CM | POA: Diagnosis not present

## 2022-09-06 DIAGNOSIS — E1142 Type 2 diabetes mellitus with diabetic polyneuropathy: Secondary | ICD-10-CM | POA: Diagnosis not present

## 2022-09-10 ENCOUNTER — Telehealth (INDEPENDENT_AMBULATORY_CARE_PROVIDER_SITE_OTHER): Payer: Self-pay | Admitting: *Deleted

## 2022-09-10 DIAGNOSIS — I6523 Occlusion and stenosis of bilateral carotid arteries: Secondary | ICD-10-CM | POA: Diagnosis not present

## 2022-09-10 DIAGNOSIS — I10 Essential (primary) hypertension: Secondary | ICD-10-CM | POA: Diagnosis not present

## 2022-09-10 DIAGNOSIS — E782 Mixed hyperlipidemia: Secondary | ICD-10-CM | POA: Diagnosis not present

## 2022-09-10 DIAGNOSIS — I739 Peripheral vascular disease, unspecified: Secondary | ICD-10-CM | POA: Diagnosis not present

## 2022-09-10 DIAGNOSIS — I351 Nonrheumatic aortic (valve) insufficiency: Secondary | ICD-10-CM | POA: Diagnosis not present

## 2022-09-10 DIAGNOSIS — I639 Cerebral infarction, unspecified: Secondary | ICD-10-CM | POA: Diagnosis not present

## 2022-09-10 DIAGNOSIS — Z01810 Encounter for preprocedural cardiovascular examination: Secondary | ICD-10-CM | POA: Diagnosis not present

## 2022-09-10 DIAGNOSIS — I2510 Atherosclerotic heart disease of native coronary artery without angina pectoris: Secondary | ICD-10-CM | POA: Diagnosis not present

## 2022-09-10 DIAGNOSIS — Z720 Tobacco use: Secondary | ICD-10-CM | POA: Diagnosis not present

## 2022-09-10 DIAGNOSIS — I480 Paroxysmal atrial fibrillation: Secondary | ICD-10-CM | POA: Diagnosis not present

## 2022-09-10 NOTE — Telephone Encounter (Signed)
Patient called to see if she has gotten cardiac clearance. Please give her a call back.

## 2022-09-18 NOTE — Telephone Encounter (Signed)
Spoke with patient's sister and the patient is scheduled with Dr. Lucky Cowboy for a right carotid endarterectomy and right common carotid stent on 10/04/22 at the [facility].pre-op phone call is on 09/24/22 between 8-1 pm.  Pre-surgical instructions were discussed and will be mailed.

## 2022-09-23 ENCOUNTER — Other Ambulatory Visit (INDEPENDENT_AMBULATORY_CARE_PROVIDER_SITE_OTHER): Payer: Self-pay | Admitting: Nurse Practitioner

## 2022-09-23 DIAGNOSIS — I6523 Occlusion and stenosis of bilateral carotid arteries: Secondary | ICD-10-CM

## 2022-09-24 ENCOUNTER — Telehealth (INDEPENDENT_AMBULATORY_CARE_PROVIDER_SITE_OTHER): Payer: Self-pay

## 2022-09-24 ENCOUNTER — Encounter
Admission: RE | Admit: 2022-09-24 | Discharge: 2022-09-24 | Disposition: A | Discharge status: Home / Self Care | Payer: Medicare HMO | Source: Ambulatory Visit | Attending: Vascular Surgery | Admitting: Vascular Surgery

## 2022-09-24 ENCOUNTER — Other Ambulatory Visit (INDEPENDENT_AMBULATORY_CARE_PROVIDER_SITE_OTHER): Payer: Self-pay | Admitting: Nurse Practitioner

## 2022-09-24 VITALS — Ht 60.0 in | Wt 133.0 lb

## 2022-09-24 DIAGNOSIS — E1165 Type 2 diabetes mellitus with hyperglycemia: Secondary | ICD-10-CM

## 2022-09-24 DIAGNOSIS — E1169 Type 2 diabetes mellitus with other specified complication: Secondary | ICD-10-CM

## 2022-09-24 MED ORDER — CLOPIDOGREL BISULFATE 75 MG PO TABS: 75.0000 mg | ORAL_TABLET | Freq: Every day | ORAL | 2 refills | Status: DC

## 2022-09-24 NOTE — Telephone Encounter (Signed)
She has some other underlying issues that may be causing this, but bring her in for a LLE art duplex only to see if there is any occlusion

## 2022-09-24 NOTE — Patient Instructions (Addendum)
Your procedure is scheduled on: Thursday October 04, 2022. Report to Day Surgery inside [facility] 2nd floor, stop by registration desk before getting on elevator.  To find out your arrival time please call [phone] between 1PM - 3PM on Wednesday October 03, 2022  Remember: Instructions that are not followed completely may result in serious medical risk,  up to and including death, or upon the discretion of your surgeon and anesthesiologist your  surgery may need to be rescheduled.     _X__ 1. Do not eat food or drink fluids after midnight the night before your procedure.                 No chewing gum or hard candies.   __X__2.  On the morning of surgery brush your teeth with toothpaste and water, you                may rinse your mouth with mouthwash if you wish.  Do not swallow any toothpaste or mouthwash.     _X__ 3.  No Alcohol for 24 hours before or after surgery.   _X__ 4.  Do Not Smoke or use e-cigarettes For 24 Hours Prior to Your Surgery.                 Do not use any chewable tobacco products for at least 6 hours prior to                 Surgery.  _X__  5.  Do not use any recreational drugs (marijuana, cocaine, heroin, ecstasy, MDMA or other)                For at least one week prior to your surgery.  Combination of these drugs with anesthesia                May have life threatening results.  ____  6.  Bring all medications with you on the day of surgery if instructed.   __X__  7.  Notify your doctor if there is any change in your medical condition      (cold, fever, infections).     Do not wear jewelry, make-up, hairpins, clips or nail polish. Do not wear lotions, powders, or perfumes. You may wear deodorant. Do not shave 48 hours prior to surgery. Men may shave face and neck. Do not bring valuables to the hospital.    [facility] is not responsible for any belongings or valuables.  Contacts, dentures or bridgework may not be worn  into surgery. Leave your suitcase in the car. After surgery it may be brought to your room. For patients admitted to the hospital, discharge time is determined by your treatment team.   Patients discharged the day of surgery will not be allowed to drive home.   Make arrangements for someone to be with you for the first 24 hours of your Same Day Discharge.   __X__ Take these medicines the morning of surgery with A SIP OF WATER:    1. amitriptyline (ELAVIL) 150 MG   2. busPIRone (BUSPAR) 5 MG   3. gabapentin (NEURONTIN) 600 MG   4. metoprolol tartrate (LOPRESSOR) 25   5. pantoprazole (PROTONIX) 40 MG   6. sertraline (ZOLOFT) 50 MG   ____ Fleet Enema (as directed)   __X__ Use CHG Soap (or wipes) as directed  ____ Use Benzoyl Peroxide Gel as instructed  __X__ Use inhalers on the day of surgery  albuterol (VENTOLIN HFA) 108 (90  Base) MCG/ACT inhaler   __X__ Stop metformin 2 days prior to surgery (take last dose Monday 10/01/22)    ____ Take 1/2 of usual insulin dose the night before surgery. No insulin the morning          of surgery.   __X__ Stop clopidogrel (PLAVIX) 75 MG 7 days before surgery as instructed by your surgeon. (Take last dose 09/26/22)  __X__ One Week prior to surgery- Stop Anti-inflammatories such as Ibuprofen, Aleve, Advil, Motrin, meloxicam (MOBIC), diclofenac, etodolac, ketorolac, Toradol, Daypro, piroxicam, Goody's or BC powders. OK TO USE TYLENOL IF NEEDED   __X__ Stop supplements until after surgery.    ____ Bring C-Pap to the hospital.    If you have any questions regarding your pre-procedure instructions,  Please call Pre-admit Testing at [phone]

## 2022-09-25 ENCOUNTER — Ambulatory Visit (INDEPENDENT_AMBULATORY_CARE_PROVIDER_SITE_OTHER): Payer: Medicare HMO

## 2022-09-25 ENCOUNTER — Encounter
Admission: RE | Admit: 2022-09-25 | Discharge: 2022-09-25 | Disposition: A | Discharge status: Home / Self Care | Payer: Medicare HMO | Source: Ambulatory Visit | Attending: Vascular Surgery | Admitting: Vascular Surgery

## 2022-09-25 ENCOUNTER — Other Ambulatory Visit (INDEPENDENT_AMBULATORY_CARE_PROVIDER_SITE_OTHER): Payer: Self-pay | Admitting: Nurse Practitioner

## 2022-09-25 DIAGNOSIS — I6523 Occlusion and stenosis of bilateral carotid arteries: Secondary | ICD-10-CM | POA: Diagnosis not present

## 2022-09-25 DIAGNOSIS — M79605 Pain in left leg: Secondary | ICD-10-CM

## 2022-09-25 LAB — CBC WITH DIFFERENTIAL/PLATELET
Abs Immature Granulocytes: 0.01 10*3/uL (ref 0.00–0.07)
Basophils Absolute: 0 10*3/uL (ref 0.0–0.1)
Basophils Relative: 1 %
Eosinophils Absolute: 0.1 10*3/uL (ref 0.0–0.5)
Eosinophils Relative: 1 %
HCT: 39.5 % (ref 36.0–46.0)
Hemoglobin: 13 g/dL (ref 12.0–15.0)
Immature Granulocytes: 0 %
Lymphocytes Relative: 38 %
Lymphs Abs: 2.4 10*3/uL (ref 0.7–4.0)
MCH: 28.3 pg (ref 26.0–34.0)
MCHC: 32.9 g/dL (ref 30.0–36.0)
MCV: 85.9 fL (ref 80.0–100.0)
Monocytes Absolute: 0.4 10*3/uL (ref 0.1–1.0)
Monocytes Relative: 7 %
Neutro Abs: 3.4 10*3/uL (ref 1.7–7.7)
Neutrophils Relative %: 53 %
Platelets: 211 10*3/uL (ref 150–400)
RBC: 4.6 MIL/uL (ref 3.87–5.11)
RDW: 15.8 % — ABNORMAL HIGH (ref 11.5–15.5)
WBC: 6.3 10*3/uL (ref 4.0–10.5)
nRBC: 0 % (ref 0.0–0.2)

## 2022-09-25 LAB — BASIC METABOLIC PANEL
Anion gap: 9 (ref 5–15)
BUN: 12 mg/dL (ref 8–23)
CO2: 27 mmol/L (ref 22–32)
Calcium: 9.5 mg/dL (ref 8.9–10.3)
Chloride: 105 mmol/L (ref 98–111)
Creatinine, Ser: 0.85 mg/dL (ref 0.44–1.00)
GFR, Estimated: >60 mL/min (ref >60)
Glucose, Bld: 145 mg/dL — ABNORMAL HIGH (ref 70–99)
Potassium: 4.1 mmol/L (ref 3.5–5.1)
Sodium: 141 mmol/L (ref 135–145)

## 2022-09-25 LAB — TYPE AND SCREEN
ABO/RH(D): O NEG
Antibody Screen: NEGATIVE

## 2022-09-26 ENCOUNTER — Encounter: Payer: Self-pay | Admitting: Urgent Care

## 2022-09-26 NOTE — Progress Notes (Addendum)
Perioperative Services  Pre-Admission/Anesthesia Testing Clinical Review  Date: 09/26/22  Patient Demographics:  Name: Autumn Arnold DOB:   [birth year] MRN:   [id number]  Planned Surgical Procedure(s):    Case: [id number] Date/Time: 10/04/22 1230   Procedures:      ENDARTERECTOMY CAROTID (Right)     INSERTION OF RETROGRADE CAROTID STENT (Right)   Anesthesia type: General   Pre-op diagnosis: CAROTID STENOSIS W/ STROKE   Location: [place] [facility] / [facility]   Surgeons: Algernon Huxley, MD   NOTE: Available PAT nursing documentation and vital signs have been reviewed. Clinical nursing staff has updated patient's PMH/PSHx, current medication list, and drug allergies/intolerances to ensure comprehensive history available to assist in medical decision making as it pertains to the aforementioned surgical procedure and anticipated anesthetic course. Extensive review of available clinical information performed. [place] PMH and PSHx updated with any diagnoses/procedures that  may have been inadvertently omitted during her intake with the pre-admission testing department's nursing staff.  Clinical Discussion:  Autumn Arnold is a 71 y.o. female who is submitted for pre-surgical anesthesia review and clearance prior to her undergoing the above procedure. Patient is a Current Smoker. Pertinent PMH includes: CAD, PAF, multiple CVAs, significant PVD, BILATERAL carotid artery disease, aortic atherosclerosis, HTN, HLD, DOE, asthma, COPD, OSAH (requires nocturnal PAP therapy), GERD (on daily PPI), OA, cervical DDD (s/p fusion), neuropathy, lower extremity weakness, ataxic gait, anxiety.  Patient is followed by cardiology Clayborn Bigness, MD). She was last seen in the cardiology clinic on 09/10/2022; notes reviewed. At the time of her clinic visit, the patient denied any chest pain, shortness of breath, PND, orthopnea, palpitations, significant peripheral edema, vertiginous symptoms, or  presyncope/syncope.  Patient with a PMH significant for cardiovascular diagnoses.  Patient has suffered multiple CVAs in the past  02/09/2010 - nonhemorrhagic RIGHT thalamic lacunar infarct  11/07/2015 - posterior superior LEFT lenticular nucleus to posterior LEFT carona radiata infarct  06/23/2021 - superior LEFT frontal and parietal ischemic infarct  04/04/2022 - subcentimeter cortical/subcortical aspect of RIGHT frontoparietal region with acute to early subacute ischemic infarct  Patient with significant PVD requiring intervention.   PTA and stenting of the SMA, infrarenal aorta, and BILATERAL common iliac arteries on 06/23/2021.   LEFT femoral enterectomy and LEFT iliac stent placed on 04/11/2022  PTA and further stenting of the SMA, BILATERAL iliacs, infrarenal aorta, and LEFT CFA on 09/25/2022.  Most recent TTE was performed on 04/05/2022 revealing normal left function with EF 60 to 65%.  There were no regional wall motion abnormalities.  Right ventricular size and function normal.  There was mild mitral valve regurgitation.  There were no significant transvalvular gradients suggestive of valvular stenosis.  Carotid Doppler performed on 04/05/2022 revealed occlusion of the RIGHT common carotid artery with distal reconstitution via retrograde flow through the external carotid artery.  There was retrograde RIGHT vertebral artery flow, which was felt to be secondary to concurrent stenosis or occlusion at the RIGHT brachiocephalic artery.  Contralaterally, there was moderate heterogeneous and irregular/ulcerated plaque in the proximal LEFT internal carotid artery.  LEFT vertebral artery flow was antegrade.  Given patient's history of recurrent CVA and significant PVD, she remains on daily DAPT therapy (ASA + clinical).  Patient reportedly compliant with therapy with no evidence or reports of GI bleeding.  Blood pressure reasonably controlled at 142/70 mmHg on currently prescribed  beta-blocker (metoprolol tartrate) monotherapy.  Patient is on atorvastatin for HLD diagnosis and ASCVD prevention.  She is  not diabetic.  Patient does have an OSAH diagnosis and is reported to be compliant with prescribed nocturnal PAP therapy.  Functional capacity limited by weakness and residual effects from previous neurovascular events.  Patient requires the assistance of gait aids for ambulation.  With that being said, it is unclear if patient is able to achieve 4 METS of physical activity without experiencing angina/anginal equivalent symptoms.  No changes were made to her medication regimen.  Patient to follow-up with outpatient cardiology in 2 months or sooner if needed.  Autumn Arnold is scheduled for a RIGHT CAROTID ENDARTERECTOMY AND INSERTION OF RETROGRADE CAROTID STENT on 10/04/2022 with Dr. [name], MD.  Given patient's past medical history significant for cardiovascular diagnoses, presurgical cardiac clearance was sought by the PAT team. Per cardiology, "this patient is optimized for surgery and may proceed with the planned procedural course with a ACCEPTABLE risk of significant perioperative cardiovascular complications".  In review of her medication reconciliation, it is noted that patient is currently on prescribed daily antiplatelet therapy.  Patient has been made aware of recommendations from her surgeon and cardiology for holding her clopidogrel dose for 7 days prior to her procedure with plans to restart as soon as postoperatively risk felt to be minimized by her primary attending surgeon.  Patient is aware that her last dose of clinical should be on 09/26/2022.  Again, given her significant PVD and history of recurrent CVA, patient will continue her daily low-dose ASA throughout her perioperative course.  Patient denies previous perioperative complications with anesthesia in the past. In review of the available records, it is noted that patient underwent a general anesthetic course  here at [facility] (ASA IV) in 04/2022 without documented complications.      09/24/2022   12:04 PM 07/30/2022   11:38 AM 07/11/2022    1:00 PM  Vitals with BMI  Height '5\' 0"'$  '5\' 0"'$    Weight 133 lbs 133 lbs 6 oz   BMI 99.35 70.17   Systolic  793 903  Diastolic  66 44  Pulse  87 93    Providers/Specialists:   NOTE: Primary physician provider listed below. Patient may have been seen by APP or partner within same practice.   PROVIDER ROLE / SPECIALTY LAST Imelda Pillow, MD Vascular Surgery (Surgeon) 07/30/2022  [name], MD Primary Care Provider 09/06/2022  Katrine Coho, MD Cardiology 09/10/2022  Wallene Huh, MD Pulmonary Medicine 03/06/2021  Gurney Maxin, MD Neurology 07/15/2019   Allergies:  Lisinopril and Plavix [clopidogrel]  Current Home Medications:   No current facility-administered medications for this encounter.    albuterol (VENTOLIN HFA) 108 (90 Base) MCG/ACT inhaler   alendronate (FOSAMAX) 70 MG tablet   amitriptyline (ELAVIL) 150 MG tablet   aspirin EC 81 MG EC tablet   atorvastatin (LIPITOR) 80 MG tablet   busPIRone (BUSPAR) 5 MG tablet   Calcium Carb-Cholecalciferol 600-400 MG-UNIT TABS   clopidogrel (PLAVIX) 75 MG tablet   gabapentin (NEURONTIN) 600 MG tablet   metFORMIN (GLUCOPHAGE) 1000 MG tablet   metoprolol tartrate (LOPRESSOR) 25 MG tablet   montelukast (SINGULAIR) 10 MG tablet   Multiple Vitamin (MULTIVITAMIN WITH MINERALS) TABS tablet   pantoprazole (PROTONIX) 40 MG tablet   senna-docusate (SENOKOT-S) 8.6-50 MG tablet   sertraline (ZOLOFT) 50 MG tablet   tiZANidine (ZANAFLEX) 4 MG tablet   vitamin B-12 (CYANOCOBALAMIN) 1000 MCG tablet   History:   Past Medical History:  Diagnosis Date   Anxiety  Aortic atherosclerosis (HCC)    Arthritis    Asthma    Ataxic gait    a.) requires gait aids for assitance   Atherosclerotic PVD with intermittent claudication ([place])    a.) s/p LEFT  femoral endartarectomy and LEFT iliac stent 04/11/2022   Bilateral carotid artery disease ([place]) 11/07/2015   a.) doppler 11/07/2015: <50 BICA; b.) doppler 06/23/2021: near occ RICA< < 51% LICA; c.) doppler 76/16/0737: occluded RICA, mod plaque LICA   Bilateral lower extremity edema    CAD (coronary artery disease)    Chronic cough    COPD (chronic obstructive pulmonary disease) (HCC)    CVA (cerebral vascular accident) ([place]) 11/07/2015   a.) posterior superior LEFT lenticular nucleus to posterior LEFT corona radiata   DDD (degenerative disc disease), cervical    a.) s/p C5-C6 fusion   DOE (dyspnea on exertion)    GERD (gastroesophageal reflux disease)    History of bilateral cataract extraction 2017   History of left cataract extraction    Hyperlipidemia    Hypertension    Ischemic cerebrovascular accident (CVA) ([place]) 06/23/2021   a.) superior LEFT frontal and parietal ischemia   Ischemic cerebrovascular accident (CVA) ([place]) 04/04/2022   a.) subcentimeter cortical/subcortical aspect of RIGHT frontoparietal region --> acute to early subacte ischemic infarct   Long term current use of antithrombotics/antiplatelets    a.) DAPT (ASA + clopidogrel)   Lower extremity weakness    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[place])    a.) s/p PTA and stenting 06/23/2022 --> SMA, mid-infrarenal, BILATERAL CIA; b.) s/p PTA and stenting 09/25/2022: SMA, BILATERAL CIA, infrarenal aorta, LEFT CFA   OSA on CPAP    PAF (paroxysmal atrial fibrillation) (HCC)    Right-sided lacunar infarction ([place]) 02/09/2010   a.) non-hemorrhagic   Small fiber neuropathy    T2DM (type 2 diabetes mellitus) ([place])    Vitamin D deficiency    Wheezing    Past Surgical History:  Procedure Laterality Date   ABDOMINAL HYSTERECTOMY     ANTERIOR FUSION CERVICAL SPINE     CARPAL TUNNEL RELEASE Bilateral    CATARACT EXTRACTION W/PHACO Left 03/12/2016   Procedure: CATARACT EXTRACTION PHACO AND INTRAOCULAR LENS PLACEMENT ([place]);  Surgeon:  Estill Cotta, MD;  Location: [facility] ORS;  Service: Ophthalmology;  Laterality: Left;  [place] 01:12 AP% 23.4 CDE 29.83 fluid pack lot # [id number]   CATARACT EXTRACTION W/PHACO Right 04/16/2016   Procedure: CATARACT EXTRACTION PHACO AND INTRAOCULAR LENS PLACEMENT (I[facility]);  Surgeon: Estill Cotta, MD;  Location: [facility] ORS;  Service: Ophthalmology;  Laterality: Right;  [place] 01:17 AP% 22.5 CDE 35.08 Fluid pack lot # [id number]   CESAREAN SECTION     COLONOSCOPY WITH PROPOFOL N/A 08/12/2018   Procedure: COLONOSCOPY WITH PROPOFOL;  Surgeon: [place], Benay Pike, MD;  Location: [facility] ENDOSCOPY;  Service: Gastroenterology;  Laterality: N/A;   ELBOW SURGERY     Tendonitis   ENDARTERECTOMY FEMORAL Left 04/11/2022   Procedure: ENDARTERECTOMY FEMORAL;  Surgeon: Algernon Huxley, MD;  Location: [facility] ORS;  Service: Vascular;  Laterality: Left;   ESOPHAGOGASTRODUODENOSCOPY (EGD) WITH PROPOFOL N/A 08/12/2018   Procedure: ESOPHAGOGASTRODUODENOSCOPY (EGD) WITH PROPOFOL;  Surgeon: [place], Benay Pike, MD;  Location: [facility] ENDOSCOPY;  Service: Gastroenterology;  Laterality: N/A;   INCONTINENCE SURGERY     INSERTION OF ILIAC STENT Left 04/11/2022   Procedure: INSERTION OF ILIAC STENT;  Surgeon: Algernon Huxley, MD;  Location: [facility] ORS;  Service: Vascular;  Laterality: Left;   LOWER EXTREMITY ANGIOGRAPHY Left 06/23/2021   Procedure: Lower Extremity  Angiography;  Surgeon: Katha Cabal, MD;  Location: [facility];  Service: Cardiovascular;  Laterality: Left;   LOWER EXTREMITY ANGIOGRAPHY Left 04/05/2022   Procedure: Lower Extremity Angiography;  Surgeon: Algernon Huxley, MD;  Location: [place];  Service: Cardiovascular;  Laterality: Left;   LOWER EXTREMITY ANGIOGRAPHY Left 06/25/2022   Procedure: Lower Extremity Angiography;  Surgeon: Algernon Huxley, MD;  Location: [place] [facility];  Service: Cardiovascular;  Laterality: Left;   LOWER EXTREMITY ANGIOGRAPHY Left 07/11/2022   Procedure: Lower Extremity  Angiography;  Surgeon: Algernon Huxley, MD;  Location: [facility];  Service: Cardiovascular;  Laterality: Left;   TONSILLECTOMY     Family History  Problem Relation Age of Onset   CAD Mother    Hypertension Mother    Arthritis Mother    Diabetes Father    CAD Father    Social History   Tobacco Use   Smoking status: Every Day    Packs/day: 1.00    Types: Cigarettes   Smokeless tobacco: Never  Vaping Use   Vaping Use: Never used  Substance Use Topics   Alcohol use: Not Currently    Comment: occ   Drug use: No    Pertinent Clinical Results:  LABS: Labs reviewed: Acceptable for surgery.  Hospital Outpatient Visit on 09/25/2022  Component Date Value Ref Range Status   ABO/RH(D) 09/25/2022 O NEG   Final   Antibody Screen 09/25/2022 NEG   Final   Sample Expiration 09/25/2022 10/09/2022,2359   Final   Extend sample reason 09/25/2022    Final                   Value:NO TRANSFUSIONS OR PREGNANCY IN THE PAST 3 MONTHS Performed at [facility], [address] [place], [place] [address]    WBC 09/25/2022 6.3  4.0 - 10.5 K/uL Final   RBC 09/25/2022 4.60  3.87 - 5.11 MIL/uL Final   Hemoglobin 09/25/2022 13.0  12.0 - 15.0 g/dL Final   HCT 09/25/2022 39.5  36.0 - 46.0 % Final   MCV 09/25/2022 85.9  80.0 - 100.0 fL Final   MCH 09/25/2022 28.3  26.0 - 34.0 pg Final   MCHC 09/25/2022 32.9  30.0 - 36.0 g/dL Final   RDW 09/25/2022 15.8 (H)  11.5 - 15.5 % Final   Platelets 09/25/2022 211  150 - 400 K/uL Final   nRBC 09/25/2022 0.0  0.0 - 0.2 % Final   Neutrophils Relative % 09/25/2022 53  % Final   Neutro Abs 09/25/2022 3.4  1.7 - 7.7 K/uL Final   Lymphocytes Relative 09/25/2022 38  % Final   Lymphs Abs 09/25/2022 2.4  0.7 - 4.0 K/uL Final   Monocytes Relative 09/25/2022 7  % Final   Monocytes Absolute 09/25/2022 0.4  0.1 - 1.0 K/uL Final   Eosinophils Relative 09/25/2022 1  % Final   Eosinophils Absolute 09/25/2022 0.1  0.0 - 0.5 K/uL Final   Basophils Relative  09/25/2022 1  % Final   Basophils Absolute 09/25/2022 0.0  0.0 - 0.1 K/uL Final   Immature Granulocytes 09/25/2022 0  % Final   Abs Immature Granulocytes 09/25/2022 0.01  0.00 - 0.07 K/uL Final   Performed at [facility], [address] [place], [place] [address]   Sodium 09/25/2022 141  135 - 145 mmol/L Final   Potassium 09/25/2022 4.1  3.5 - 5.1 mmol/L Final   Chloride 09/25/2022 105  98 - 111 mmol/L  Final   CO2 09/25/2022 27  22 - 32 mmol/L Final   Glucose, Bld 09/25/2022 145 (H)  70 - 99 mg/dL Final   Glucose reference range applies only to samples taken after fasting for at least 8 hours.   BUN 09/25/2022 12  8 - 23 mg/dL Final   Creatinine, Ser 09/25/2022 0.85  0.44 - 1.00 mg/dL Final   Calcium 09/25/2022 9.5  8.9 - 10.3 mg/dL Final   GFR, Estimated 09/25/2022 >60  >60 mL/min Final   Comment: (NOTE) Calculated using the CKD-EPI Creatinine Equation (2021)    Anion gap 09/25/2022 9  5 - 15 Final   Performed at [facility], [address] [place], [place] [address]    ECG: Date: 09/10/2022 Time ECG obtained: 1101 AM Rate: 98 bpm Rhythm: normal sinus Axis (leads I and aVF): Normal Intervals: PR 136 ms. QRS 76 ms. QTc 464 ms. ST segment and T wave changes: No evidence of acute ST segment elevation or depression Comparison: Similar to previous tracing obtained on 04/19/2022   IMAGING / PROCEDURES: VAS [place] ABI WITH/WO TBI performed on 07/24/2022 Right: Resting right ankle-brachial index is within normal range. The right toe-brachial index is normal.  Left: Resting left ankle-brachial index is within normal range. The left toe-brachial index is normal.   CT HEAD AND CERVICAL SPINE WO CONTRAST performed on 04/30/2022 No acute intracranial abnormality. No evidence of acute fracture or traumatic subluxation of the cervical spine. Multilevel spinal degenerative changes and prior C5-6 spinal fusion without change.  DIAGNOSTIC RADIOGRAPHS OF CHEST 1 VIEW  PORTABLE performed on 04/30/2022 There are no signs of pulmonary edema or focal pulmonary consolidation.  Small linear densities in the medial lower lung fields may suggest crowding of normal bronchovascular structures or subsegmental atelectasis.  MR LUMBAR SPINE WO CONTRAST performed on 04/19/2022 No acute abnormality within the lumbar spine. No significant spinal stenosis or evidence for cord compression. Small biforaminal disc protrusions at L4-5, potentially affecting either of the exiting L4 nerve roots. Small left foraminal to extraforaminal disc protrusions at L2-3 and L3-4, potentially affecting the exiting left L2 and L3 nerve roots respectively. Moderate right worse than left facet hypertrophy at L4-5 with associated marrow edema. Finding could serve as a source for lower back pain.  TRANSTHORACIC ECHOCARDIOGRAM performed on 04/05/2022 Left ventricular ejection fraction, by estimation, is 60 to 65%. The left ventricle has normal function. The left ventricle has no regional wall motion abnormalities. Left ventricular diastolic parameters were normal.  Right ventricular systolic function is normal. The right ventricular size is normal.  The mitral valve is normal in structure. Moderate mitral valve regurgitation. No evidence of mitral stenosis.  The aortic valve is normal in structure. Aortic valve regurgitation is mild. No aortic stenosis is present.  The inferior vena cava is normal in size with greater than 50% respiratory variability, suggesting right atrial pressure of 3 mmHg.   BILATERAL CAROTID DOPPLER performed on 04/05/2022 Right common carotid artery occlusion with distal reconstitution via retrograde flow through the external carotid artery. Retrograde right vertebral artery flow. This may be due to concurrent stenosis or occlusion in the right brachiocephalic artery. Moderate heterogeneous and irregular/ulcerated atherosclerotic plaque in the proximal left internal carotid  artery. Elevation of the peak systolic velocity is likely greater than expected for the degree of stenosis secondary to the contralateral common carotid artery occlusion. Please refer to recent CT arteriogram of the neck for degree of stenosis. Left vertebral artery flow is antegrade.  Impression and  Plan:  Autumn Arnold has been referred for pre-anesthesia review and clearance prior to her undergoing the planned anesthetic and procedural courses. Available labs, pertinent testing, and imaging results were personally reviewed by me. This patient has been appropriately cleared by cardiology with an overall ACCEPTABLE risk of significant perioperative cardiovascular complications.  Based on clinical review performed today (09/26/22), barring any significant acute changes in the patient's overall condition, it is anticipated that she will be able to proceed with the planned surgical intervention. Any acute changes in clinical condition may necessitate her procedure being postponed and/or cancelled. Patient will meet with anesthesia team (MD and/or CRNA) on the day of her procedure for preoperative evaluation/assessment. Questions regarding anesthetic course will be fielded at that time.   Pre-surgical instructions were reviewed with the patient during her PAT appointment and questions were fielded by PAT clinical staff. Patient was advised that if any questions or concerns arise prior to her procedure then she should return a call to PAT and/or her surgeon's office to discuss.  Honor Loh, MSN, APRN, FNP-C, CEN [facility]  Peri-operative Services Nurse Practitioner Phone: [phone] Fax: [phone] 09/26/22 1:25 PM  NOTE: This note has been prepared using Dragon dictation software. Despite my best ability to proofread, there is always the potential that unintentional transcriptional errors may still occur from this process.

## 2022-10-04 ENCOUNTER — Inpatient Hospital Stay
Admission: RE | Admit: 2022-10-04 | Discharge: 2022-10-05 | DRG: 036 | Disposition: A | Discharge status: Home / Self Care | Payer: Medicare HMO | Attending: Vascular Surgery | Admitting: Vascular Surgery

## 2022-10-04 ENCOUNTER — Inpatient Hospital Stay: Payer: Medicare HMO

## 2022-10-04 ENCOUNTER — Encounter
Admission: RE | Disposition: A | Discharge status: Home / Self Care | Payer: Self-pay | Source: Home / Self Care | Attending: Vascular Surgery

## 2022-10-04 ENCOUNTER — Inpatient Hospital Stay: Payer: Medicare HMO | Admitting: Urgent Care

## 2022-10-04 ENCOUNTER — Other Ambulatory Visit: Payer: Self-pay

## 2022-10-04 ENCOUNTER — Encounter: Payer: Self-pay | Admitting: Vascular Surgery

## 2022-10-04 DIAGNOSIS — Z8249 Family history of ischemic heart disease and other diseases of the circulatory system: Secondary | ICD-10-CM

## 2022-10-04 DIAGNOSIS — Z8673 Personal history of transient ischemic attack (TIA), and cerebral infarction without residual deficits: Secondary | ICD-10-CM

## 2022-10-04 DIAGNOSIS — I70222 Atherosclerosis of native arteries of extremities with rest pain, left leg: Secondary | ICD-10-CM | POA: Diagnosis present

## 2022-10-04 DIAGNOSIS — K219 Gastro-esophageal reflux disease without esophagitis: Secondary | ICD-10-CM | POA: Diagnosis present

## 2022-10-04 DIAGNOSIS — G4733 Obstructive sleep apnea (adult) (pediatric): Secondary | ICD-10-CM | POA: Diagnosis present

## 2022-10-04 DIAGNOSIS — Z90710 Acquired absence of both cervix and uterus: Secondary | ICD-10-CM

## 2022-10-04 DIAGNOSIS — Z833 Family history of diabetes mellitus: Secondary | ICD-10-CM | POA: Diagnosis not present

## 2022-10-04 DIAGNOSIS — I700 Atherosclerosis of aorta: Secondary | ICD-10-CM | POA: Diagnosis not present

## 2022-10-04 DIAGNOSIS — J4489 Other specified chronic obstructive pulmonary disease: Secondary | ICD-10-CM | POA: Diagnosis present

## 2022-10-04 DIAGNOSIS — I2510 Atherosclerotic heart disease of native coronary artery without angina pectoris: Secondary | ICD-10-CM | POA: Diagnosis present

## 2022-10-04 DIAGNOSIS — E1151 Type 2 diabetes mellitus with diabetic peripheral angiopathy without gangrene: Secondary | ICD-10-CM | POA: Diagnosis present

## 2022-10-04 DIAGNOSIS — I639 Cerebral infarction, unspecified: Secondary | ICD-10-CM | POA: Diagnosis not present

## 2022-10-04 DIAGNOSIS — Z9842 Cataract extraction status, left eye: Secondary | ICD-10-CM | POA: Diagnosis not present

## 2022-10-04 DIAGNOSIS — Z9889 Other specified postprocedural states: Secondary | ICD-10-CM | POA: Diagnosis not present

## 2022-10-04 DIAGNOSIS — Z888 Allergy status to other drugs, medicaments and biological substances status: Secondary | ICD-10-CM | POA: Diagnosis not present

## 2022-10-04 DIAGNOSIS — I708 Atherosclerosis of other arteries: Secondary | ICD-10-CM | POA: Diagnosis not present

## 2022-10-04 DIAGNOSIS — I6523 Occlusion and stenosis of bilateral carotid arteries: Secondary | ICD-10-CM | POA: Diagnosis not present

## 2022-10-04 DIAGNOSIS — Z7902 Long term (current) use of antithrombotics/antiplatelets: Secondary | ICD-10-CM

## 2022-10-04 DIAGNOSIS — I10 Essential (primary) hypertension: Secondary | ICD-10-CM | POA: Diagnosis present

## 2022-10-04 DIAGNOSIS — F17210 Nicotine dependence, cigarettes, uncomplicated: Secondary | ICD-10-CM | POA: Diagnosis present

## 2022-10-04 DIAGNOSIS — F17200 Nicotine dependence, unspecified, uncomplicated: Secondary | ICD-10-CM | POA: Diagnosis not present

## 2022-10-04 DIAGNOSIS — F419 Anxiety disorder, unspecified: Secondary | ICD-10-CM | POA: Diagnosis present

## 2022-10-04 DIAGNOSIS — E782 Mixed hyperlipidemia: Secondary | ICD-10-CM | POA: Diagnosis not present

## 2022-10-04 DIAGNOSIS — E1169 Type 2 diabetes mellitus with other specified complication: Secondary | ICD-10-CM | POA: Diagnosis not present

## 2022-10-04 DIAGNOSIS — I480 Paroxysmal atrial fibrillation: Secondary | ICD-10-CM | POA: Diagnosis present

## 2022-10-04 DIAGNOSIS — Z8261 Family history of arthritis: Secondary | ICD-10-CM | POA: Diagnosis not present

## 2022-10-04 DIAGNOSIS — I6521 Occlusion and stenosis of right carotid artery: Secondary | ICD-10-CM | POA: Diagnosis not present

## 2022-10-04 DIAGNOSIS — Z9841 Cataract extraction status, right eye: Secondary | ICD-10-CM | POA: Diagnosis not present

## 2022-10-04 DIAGNOSIS — I63239 Cerebral infarction due to unspecified occlusion or stenosis of unspecified carotid arteries: Principal | ICD-10-CM | POA: Diagnosis present

## 2022-10-04 DIAGNOSIS — E1165 Type 2 diabetes mellitus with hyperglycemia: Secondary | ICD-10-CM

## 2022-10-04 DIAGNOSIS — I6529 Occlusion and stenosis of unspecified carotid artery: Secondary | ICD-10-CM | POA: Diagnosis not present

## 2022-10-04 DIAGNOSIS — I6522 Occlusion and stenosis of left carotid artery: Secondary | ICD-10-CM

## 2022-10-04 HISTORY — DX: Other cervical disc degeneration, unspecified cervical region: M50.30

## 2022-10-04 HISTORY — DX: Type 2 diabetes mellitus without complications: E11.9

## 2022-10-04 HISTORY — PX: INSERTION OF RETROGRADE CAROTID STENT: SHX5869

## 2022-10-04 HISTORY — DX: Chronic cough: R05.3

## 2022-10-04 HISTORY — DX: Ataxic gait: R26.0

## 2022-10-04 HISTORY — DX: Atherosclerosis of aorta: I70.0

## 2022-10-04 HISTORY — DX: Other forms of dyspnea: R06.09

## 2022-10-04 HISTORY — DX: Obstructive sleep apnea (adult) (pediatric): G47.33

## 2022-10-04 HISTORY — DX: Other symptoms and signs involving the musculoskeletal system: R29.898

## 2022-10-04 HISTORY — DX: Long term (current) use of antithrombotics/antiplatelets: Z79.02

## 2022-10-04 HISTORY — PX: ENDARTERECTOMY: SHX5162

## 2022-10-04 HISTORY — DX: Atherosclerosis of native arteries of extremities with intermittent claudication, unspecified extremity: I70.219

## 2022-10-04 HISTORY — DX: Polyneuropathy, unspecified: G62.9

## 2022-10-04 HISTORY — DX: Paroxysmal atrial fibrillation: I48.0

## 2022-10-04 HISTORY — DX: Chronic vascular disorders of intestine: K55.1

## 2022-10-04 HISTORY — DX: Vitamin D deficiency, unspecified: E55.9

## 2022-10-04 HISTORY — DX: Cataract extraction status, left eye: Z98.42

## 2022-10-04 HISTORY — DX: Atherosclerotic heart disease of native coronary artery without angina pectoris: I25.10

## 2022-10-04 LAB — GLUCOSE, CAPILLARY
Glucose-Capillary: 157 mg/dL — ABNORMAL HIGH (ref 70–99)
Glucose-Capillary: 187 mg/dL — ABNORMAL HIGH (ref 70–99)
Glucose-Capillary: 189 mg/dL — ABNORMAL HIGH (ref 70–99)
Glucose-Capillary: 267 mg/dL — ABNORMAL HIGH (ref 70–99)
Glucose-Capillary: 360 mg/dL — ABNORMAL HIGH (ref 70–99)

## 2022-10-04 LAB — MRSA NEXT GEN BY PCR, NASAL: MRSA by PCR Next Gen: NOT DETECTED

## 2022-10-04 SURGERY — ENDARTERECTOMY, CAROTID
Anesthesia: General | Laterality: Right

## 2022-10-04 MED ORDER — PANTOPRAZOLE SODIUM 40 MG PO TBEC
40.0000 mg | DELAYED_RELEASE_TABLET | Freq: Every day | ORAL | Status: DC
  Administered 2022-10-04 – 2022-10-05 (×2): 40 mg via ORAL
  Filled 2022-10-04 (×2): qty 1

## 2022-10-04 MED ORDER — SODIUM CHLORIDE 0.9 % IV SOLN: INTRAVENOUS | Status: DC

## 2022-10-04 MED ORDER — FAMOTIDINE IN NACL 20-0.9 MG/50ML-% IV SOLN
20.0000 mg | Freq: Two times a day (BID) | INTRAVENOUS | Status: DC
  Administered 2022-10-04 – 2022-10-05 (×2): 20 mg via INTRAVENOUS
  Filled 2022-10-04 (×2): qty 50

## 2022-10-04 MED ORDER — FENTANYL CITRATE (PF) 100 MCG/2ML IJ SOLN
INTRAMUSCULAR | Status: AC
  Administered 2022-10-04: 25 ug via INTRAVENOUS
  Filled 2022-10-04: qty 2

## 2022-10-04 MED ORDER — DEXAMETHASONE SODIUM PHOSPHATE 10 MG/ML IJ SOLN
INTRAMUSCULAR | Status: DC | PRN
  Administered 2022-10-04: 10 mg via INTRAVENOUS

## 2022-10-04 MED ORDER — ALBUTEROL SULFATE (2.5 MG/3ML) 0.083% IN NEBU: 2.5000 mg | INHALATION_SOLUTION | RESPIRATORY_TRACT | Status: DC | PRN

## 2022-10-04 MED ORDER — ACETAMINOPHEN 325 MG RE SUPP: 325.0000 mg | RECTAL | Status: DC | PRN

## 2022-10-04 MED ORDER — SODIUM CHLORIDE 0.9 % IV SOLN
500.0000 mL | Freq: Once | INTRAVENOUS | Status: AC | PRN
  Administered 2022-10-05: 500 mL via INTRAVENOUS

## 2022-10-04 MED ORDER — ONDANSETRON HCL 4 MG/2ML IJ SOLN
INTRAMUSCULAR | Status: AC
  Filled 2022-10-04: qty 2

## 2022-10-04 MED ORDER — CEFAZOLIN SODIUM-DEXTROSE 2-4 GM/100ML-% IV SOLN
2.0000 g | INTRAVENOUS | Status: AC
  Administered 2022-10-04: 2 g via INTRAVENOUS

## 2022-10-04 MED ORDER — NITROGLYCERIN IN D5W 200-5 MCG/ML-% IV SOLN
INTRAVENOUS | Status: AC
  Filled 2022-10-04: qty 250

## 2022-10-04 MED ORDER — ATORVASTATIN CALCIUM 20 MG PO TABS
80.0000 mg | ORAL_TABLET | Freq: Every day | ORAL | Status: DC
  Administered 2022-10-04 – 2022-10-05 (×2): 80 mg via ORAL
  Filled 2022-10-04 (×2): qty 4

## 2022-10-04 MED ORDER — CEFAZOLIN SODIUM-DEXTROSE 2-4 GM/100ML-% IV SOLN
2.0000 g | Freq: Three times a day (TID) | INTRAVENOUS | Status: AC
  Administered 2022-10-04 – 2022-10-05 (×2): 2 g via INTRAVENOUS
  Filled 2022-10-04 (×2): qty 100

## 2022-10-04 MED ORDER — SODIUM CHLORIDE 0.9 % IV SOLN
INTRAVENOUS | Status: DC | PRN
  Administered 2022-10-04: 250 mL via INTRAMUSCULAR

## 2022-10-04 MED ORDER — LABETALOL HCL 5 MG/ML IV SOLN
10.0000 mg | INTRAVENOUS | Status: DC | PRN
  Administered 2022-10-04 – 2022-10-05 (×2): 10 mg via INTRAVENOUS
  Filled 2022-10-04 (×2): qty 4

## 2022-10-04 MED ORDER — OXYCODONE HCL 5 MG PO TABS: 5.0000 mg | ORAL_TABLET | Freq: Once | ORAL | Status: DC | PRN

## 2022-10-04 MED ORDER — CEFAZOLIN SODIUM-DEXTROSE 2-4 GM/100ML-% IV SOLN
INTRAVENOUS | Status: AC
  Filled 2022-10-04: qty 100

## 2022-10-04 MED ORDER — HYDROMORPHONE HCL 1 MG/ML IJ SOLN: 1.0000 mg | Freq: Once | INTRAMUSCULAR | Status: DC | PRN

## 2022-10-04 MED ORDER — FENTANYL CITRATE (PF) 100 MCG/2ML IJ SOLN
INTRAMUSCULAR | Status: AC
  Filled 2022-10-04: qty 2

## 2022-10-04 MED ORDER — BUSPIRONE HCL 5 MG PO TABS
5.0000 mg | ORAL_TABLET | Freq: Two times a day (BID) | ORAL | Status: DC
  Administered 2022-10-04 – 2022-10-05 (×2): 5 mg via ORAL
  Filled 2022-10-04 (×2): qty 1

## 2022-10-04 MED ORDER — GABAPENTIN 300 MG PO CAPS
600.0000 mg | ORAL_CAPSULE | Freq: Four times a day (QID) | ORAL | Status: DC
  Administered 2022-10-04 – 2022-10-05 (×2): 600 mg via ORAL
  Filled 2022-10-04 (×2): qty 2

## 2022-10-04 MED ORDER — ORAL CARE MOUTH RINSE: 15.0000 mL | Freq: Once | OROMUCOSAL | Status: AC

## 2022-10-04 MED ORDER — ALUM & MAG HYDROXIDE-SIMETH 200-200-20 MG/5ML PO SUSP: 15.0000 mL | ORAL | Status: DC | PRN

## 2022-10-04 MED ORDER — CHLORHEXIDINE GLUCONATE 0.12 % MT SOLN
OROMUCOSAL | Status: AC
  Administered 2022-10-04: 15 mL via OROMUCOSAL
  Filled 2022-10-04: qty 15

## 2022-10-04 MED ORDER — CLOPIDOGREL BISULFATE 75 MG PO TABS
75.0000 mg | ORAL_TABLET | Freq: Every day | ORAL | Status: DC
  Administered 2022-10-04 – 2022-10-05 (×2): 75 mg via ORAL
  Filled 2022-10-04: qty 1

## 2022-10-04 MED ORDER — ALBUTEROL SULFATE HFA 108 (90 BASE) MCG/ACT IN AERS: 2.0000 | INHALATION_SPRAY | RESPIRATORY_TRACT | Status: DC | PRN

## 2022-10-04 MED ORDER — OXYCODONE-ACETAMINOPHEN 5-325 MG PO TABS
1.0000 | ORAL_TABLET | ORAL | Status: DC | PRN
  Administered 2022-10-04: 1 via ORAL
  Administered 2022-10-04 – 2022-10-05 (×2): 2 via ORAL
  Filled 2022-10-04 (×2): qty 2
  Filled 2022-10-04: qty 1

## 2022-10-04 MED ORDER — INSULIN ASPART 100 UNIT/ML IJ SOLN
0.0000 [IU] | Freq: Three times a day (TID) | INTRAMUSCULAR | Status: DC
  Administered 2022-10-05: 2 [IU] via SUBCUTANEOUS
  Filled 2022-10-04: qty 1

## 2022-10-04 MED ORDER — ACETAMINOPHEN 10 MG/ML IV SOLN
INTRAVENOUS | Status: DC | PRN
  Administered 2022-10-04: 1000 mg via INTRAVENOUS

## 2022-10-04 MED ORDER — DOCUSATE SODIUM 100 MG PO CAPS
100.0000 mg | ORAL_CAPSULE | Freq: Every day | ORAL | Status: DC
  Administered 2022-10-05: 100 mg via ORAL
  Filled 2022-10-04: qty 1

## 2022-10-04 MED ORDER — PROPOFOL 10 MG/ML IV BOLUS
INTRAVENOUS | Status: DC | PRN
  Administered 2022-10-04: 20 mg via INTRAVENOUS
  Administered 2022-10-04: 100 mg via INTRAVENOUS

## 2022-10-04 MED ORDER — HYDRALAZINE HCL 20 MG/ML IJ SOLN: 5.0000 mg | INTRAMUSCULAR | Status: DC | PRN

## 2022-10-04 MED ORDER — SUGAMMADEX SODIUM 500 MG/5ML IV SOLN
INTRAVENOUS | Status: AC
  Filled 2022-10-04: qty 5

## 2022-10-04 MED ORDER — PHENOL 1.4 % MT LIQD: 1.0000 | OROMUCOSAL | Status: DC | PRN

## 2022-10-04 MED ORDER — MIDAZOLAM HCL 2 MG/2ML IJ SOLN
INTRAMUSCULAR | Status: DC | PRN
  Administered 2022-10-04: 2 mg via INTRAVENOUS

## 2022-10-04 MED ORDER — LIDOCAINE HCL (CARDIAC) PF 100 MG/5ML IV SOSY
PREFILLED_SYRINGE | INTRAVENOUS | Status: DC | PRN
  Administered 2022-10-04: 100 mg via INTRAVENOUS

## 2022-10-04 MED ORDER — HEPARIN SODIUM (PORCINE) 1000 UNIT/ML IJ SOLN
INTRAMUSCULAR | Status: DC | PRN
  Administered 2022-10-04: 7000 [IU] via INTRAVENOUS
  Administered 2022-10-04: 2000 [IU] via INTRAVENOUS

## 2022-10-04 MED ORDER — PHENYLEPHRINE HCL (PRESSORS) 10 MG/ML IV SOLN
INTRAVENOUS | Status: DC | PRN
  Administered 2022-10-04: 160 ug via INTRAVENOUS

## 2022-10-04 MED ORDER — ONDANSETRON HCL 4 MG/2ML IJ SOLN: 4.0000 mg | Freq: Four times a day (QID) | INTRAMUSCULAR | Status: DC | PRN

## 2022-10-04 MED ORDER — ESMOLOL HCL 100 MG/10ML IV SOLN
INTRAVENOUS | Status: DC | PRN
  Administered 2022-10-04 (×2): 10 mg via INTRAVENOUS

## 2022-10-04 MED ORDER — FENTANYL CITRATE (PF) 100 MCG/2ML IJ SOLN
INTRAMUSCULAR | Status: DC | PRN
  Administered 2022-10-04: 25 ug via INTRAVENOUS
  Administered 2022-10-04 (×2): 50 ug via INTRAVENOUS
  Administered 2022-10-04: 25 ug via INTRAVENOUS
  Administered 2022-10-04: 50 ug via INTRAVENOUS

## 2022-10-04 MED ORDER — NITROGLYCERIN IN D5W 200-5 MCG/ML-% IV SOLN: 5.0000 ug/min | INTRAVENOUS | Status: DC

## 2022-10-04 MED ORDER — LIDOCAINE HCL (PF) 1 % IJ SOLN
INTRAMUSCULAR | Status: AC
  Filled 2022-10-04: qty 30

## 2022-10-04 MED ORDER — METOPROLOL TARTRATE 5 MG/5ML IV SOLN: 2.0000 mg | INTRAVENOUS | Status: DC | PRN

## 2022-10-04 MED ORDER — GUAIFENESIN-DM 100-10 MG/5ML PO SYRP: 15.0000 mL | ORAL_SOLUTION | ORAL | Status: DC | PRN

## 2022-10-04 MED ORDER — MONTELUKAST SODIUM 10 MG PO TABS
10.0000 mg | ORAL_TABLET | Freq: Every day | ORAL | Status: DC
  Administered 2022-10-04: 10 mg via ORAL
  Filled 2022-10-04: qty 1

## 2022-10-04 MED ORDER — "VISTASEAL 4 ML SINGLE DOSE KIT "
PACK | CUTANEOUS | Status: AC
  Filled 2022-10-04: qty 4

## 2022-10-04 MED ORDER — ONDANSETRON HCL 4 MG/2ML IJ SOLN
INTRAMUSCULAR | Status: DC | PRN
  Administered 2022-10-04: 4 mg via INTRAVENOUS

## 2022-10-04 MED ORDER — DEXMEDETOMIDINE HCL IN NACL 200 MCG/50ML IV SOLN
INTRAVENOUS | Status: DC | PRN
  Administered 2022-10-04 (×2): 8 ug via INTRAVENOUS

## 2022-10-04 MED ORDER — POTASSIUM CHLORIDE CRYS ER 20 MEQ PO TBCR: 20.0000 meq | EXTENDED_RELEASE_TABLET | Freq: Every day | ORAL | Status: DC | PRN

## 2022-10-04 MED ORDER — "VISTASEAL 4 ML SINGLE DOSE KIT "
PACK | CUTANEOUS | Status: DC | PRN
  Administered 2022-10-04: 4 mL via TOPICAL

## 2022-10-04 MED ORDER — ADULT MULTIVITAMIN W/MINERALS CH
1.0000 | ORAL_TABLET | Freq: Every day | ORAL | Status: DC
  Administered 2022-10-04 – 2022-10-05 (×2): 1 via ORAL
  Filled 2022-10-04 (×2): qty 1

## 2022-10-04 MED ORDER — INSULIN ASPART 100 UNIT/ML IJ SOLN
3.0000 [IU] | Freq: Three times a day (TID) | INTRAMUSCULAR | Status: DC
  Administered 2022-10-05: 3 [IU] via SUBCUTANEOUS
  Filled 2022-10-04: qty 1

## 2022-10-04 MED ORDER — AMITRIPTYLINE HCL 75 MG PO TABS
150.0000 mg | ORAL_TABLET | Freq: Every day | ORAL | Status: DC
  Administered 2022-10-04: 150 mg via ORAL
  Filled 2022-10-04: qty 2

## 2022-10-04 MED ORDER — ASPIRIN 81 MG PO TBEC
81.0000 mg | DELAYED_RELEASE_TABLET | Freq: Every day | ORAL | Status: DC
  Administered 2022-10-04 – 2022-10-05 (×2): 81 mg via ORAL
  Filled 2022-10-04 (×2): qty 1

## 2022-10-04 MED ORDER — MIDAZOLAM HCL 2 MG/2ML IJ SOLN
INTRAMUSCULAR | Status: AC
  Filled 2022-10-04: qty 2

## 2022-10-04 MED ORDER — LIDOCAINE HCL 1 % IJ SOLN
INTRAMUSCULAR | Status: DC | PRN
  Administered 2022-10-04: 10 mL

## 2022-10-04 MED ORDER — ACETAMINOPHEN 325 MG PO TABS: 325.0000 mg | ORAL_TABLET | ORAL | Status: DC | PRN

## 2022-10-04 MED ORDER — CHLORHEXIDINE GLUCONATE 0.12 % MT SOLN
15.0000 mL | Freq: Once | OROMUCOSAL | Status: AC
  Administered 2022-10-04: 15 mL via OROMUCOSAL

## 2022-10-04 MED ORDER — MAGNESIUM SULFATE 2 GM/50ML IV SOLN: 2.0000 g | Freq: Every day | INTRAVENOUS | Status: DC | PRN

## 2022-10-04 MED ORDER — CHLORHEXIDINE GLUCONATE CLOTH 2 % EX PADS
6.0000 | MEDICATED_PAD | Freq: Every day | CUTANEOUS | Status: DC
  Administered 2022-10-05: 6 via TOPICAL

## 2022-10-04 MED ORDER — FENTANYL CITRATE (PF) 100 MCG/2ML IJ SOLN
25.0000 ug | INTRAMUSCULAR | Status: DC | PRN
  Administered 2022-10-04: 25 ug via INTRAVENOUS

## 2022-10-04 MED ORDER — SUGAMMADEX SODIUM 200 MG/2ML IV SOLN
INTRAVENOUS | Status: DC | PRN
  Administered 2022-10-04: 200 mg via INTRAVENOUS

## 2022-10-04 MED ORDER — SERTRALINE HCL 50 MG PO TABS
150.0000 mg | ORAL_TABLET | Freq: Every day | ORAL | Status: DC
  Administered 2022-10-04 – 2022-10-05 (×2): 150 mg via ORAL
  Filled 2022-10-04 (×2): qty 3

## 2022-10-04 MED ORDER — MORPHINE SULFATE (PF) 2 MG/ML IV SOLN: 2.0000 mg | INTRAVENOUS | Status: DC | PRN

## 2022-10-04 MED ORDER — LACTATED RINGERS IV SOLN: INTRAVENOUS | Status: DC

## 2022-10-04 MED ORDER — LABETALOL HCL 5 MG/ML IV SOLN
INTRAVENOUS | Status: DC | PRN
  Administered 2022-10-04: 5 mg via INTRAVENOUS
  Administered 2022-10-04: 2.5 mg via INTRAVENOUS

## 2022-10-04 MED ORDER — 0.9 % SODIUM CHLORIDE (POUR BTL) OPTIME
TOPICAL | Status: DC | PRN
  Administered 2022-10-04: 500 mL

## 2022-10-04 MED ORDER — PHENYLEPHRINE HCL-NACL 20-0.9 MG/250ML-% IV SOLN
INTRAVENOUS | Status: AC
  Filled 2022-10-04: qty 250

## 2022-10-04 MED ORDER — CLOPIDOGREL BISULFATE 75 MG PO TABS
ORAL_TABLET | ORAL | Status: AC
  Filled 2022-10-04: qty 1

## 2022-10-04 MED ORDER — OXYCODONE HCL 5 MG/5ML PO SOLN: 5.0000 mg | Freq: Once | ORAL | Status: DC | PRN

## 2022-10-04 MED ORDER — OYSTER SHELL CALCIUM/D3 500-5 MG-MCG PO TABS
1.0000 | ORAL_TABLET | Freq: Every day | ORAL | Status: DC
  Administered 2022-10-04 – 2022-10-05 (×2): 1 via ORAL
  Filled 2022-10-04 (×2): qty 1

## 2022-10-04 MED ORDER — METOPROLOL TARTRATE 25 MG PO TABS
12.5000 mg | ORAL_TABLET | Freq: Two times a day (BID) | ORAL | Status: DC
  Administered 2022-10-04 – 2022-10-05 (×2): 12.5 mg via ORAL
  Filled 2022-10-04 (×2): qty 1

## 2022-10-04 MED ORDER — ROCURONIUM BROMIDE 100 MG/10ML IV SOLN
INTRAVENOUS | Status: DC | PRN
  Administered 2022-10-04: 40 mg via INTRAVENOUS
  Administered 2022-10-04 (×3): 20 mg via INTRAVENOUS

## 2022-10-04 MED ORDER — CHLORHEXIDINE GLUCONATE CLOTH 2 % EX PADS: 6.0000 | MEDICATED_PAD | Freq: Once | CUTANEOUS | Status: DC

## 2022-10-04 MED ORDER — CHLORHEXIDINE GLUCONATE CLOTH 2 % EX PADS
6.0000 | MEDICATED_PAD | Freq: Once | CUTANEOUS | Status: DC
  Administered 2022-10-04: 6 via TOPICAL

## 2022-10-04 MED ORDER — INSULIN ASPART 100 UNIT/ML IJ SOLN
0.0000 [IU] | Freq: Every day | INTRAMUSCULAR | Status: DC
  Administered 2022-10-04: 5 [IU] via SUBCUTANEOUS
  Filled 2022-10-04: qty 1

## 2022-10-04 MED ORDER — NITROGLYCERIN 0.2 MG/ML ON CALL CATH LAB
INTRAVENOUS | Status: DC | PRN
  Administered 2022-10-04 (×2): 10 ug via INTRAVENOUS

## 2022-10-04 MED ORDER — HEPARIN SODIUM (PORCINE) 5000 UNIT/ML IJ SOLN
INTRAMUSCULAR | Status: AC
  Filled 2022-10-04: qty 2

## 2022-10-04 MED ORDER — ACETAMINOPHEN 10 MG/ML IV SOLN
INTRAVENOUS | Status: AC
  Filled 2022-10-04: qty 100

## 2022-10-04 SURGICAL SUPPLY — 90 items
ADH SKN CLS APL DERMABOND .7 (GAUZE/BANDAGES/DRESSINGS) ×1
APL PRP STRL LF DISP 70% ISPRP (MISCELLANEOUS) ×1
BAG DECANTER FOR FLEXI CONT (MISCELLANEOUS) ×2 IMPLANT
BALLN LUTONIX 018 5X40X130 (BALLOONS) ×1
BALLN LUTONIX DCB 5X100X130 (BALLOONS) ×1
BALLN LUTONIX DCB 5X40X130 (BALLOONS) ×1
BALLOON LUTONIX 018 5X40X130 (BALLOONS) IMPLANT
BALLOON LUTONIX DCB 5X100X130 (BALLOONS) IMPLANT
BALLOON LUTONIX DCB 5X40X130 (BALLOONS) IMPLANT
BLADE SURG 15 STRL LF DISP TIS (BLADE) ×2 IMPLANT
BLADE SURG 15 STRL SS (BLADE) ×1
BLADE SURG SZ11 CARB STEEL (BLADE) ×2 IMPLANT
BOOT SUTURE AID YELLOW STND (SUTURE) ×2 IMPLANT
BRUSH SCRUB EZ  4% CHG (MISCELLANEOUS) ×1
BRUSH SCRUB EZ 4% CHG (MISCELLANEOUS) ×2 IMPLANT
Balloon Expandable Vascular Covered Stent 8mm X 26 (Stent) IMPLANT
CANNULA 5F STIFF (CANNULA) IMPLANT
CATH BEACON 5 .035 100 JB2 TIP (CATHETERS) IMPLANT
CATH BEACON 5 .035 40 KMP TP (CATHETERS) IMPLANT
CATH BEACON 5 .035 65 KMP TIP (CATHETERS) IMPLANT
CATH BEACON 5 .038 40 KMP TP (CATHETERS) ×1
CHLORAPREP W/TINT 26 (MISCELLANEOUS) ×2 IMPLANT
COVER PROBE W GEL 5X96 (DRAPES) IMPLANT
DERMABOND ADVANCED .7 DNX12 (GAUZE/BANDAGES/DRESSINGS) ×2 IMPLANT
DEVICE SAFEGUARD 24CM (GAUZE/BANDAGES/DRESSINGS) IMPLANT
DEVICE STARCLOSE SE CLOSURE (Vascular Products) IMPLANT
DEVICE TORQUE (MISCELLANEOUS) IMPLANT
DRAPE 3/4 80X56 (DRAPES) ×2 IMPLANT
DRAPE INCISE IOBAN 66X45 STRL (DRAPES) ×2 IMPLANT
DRAPE LAPAROTOMY 77X122 PED (DRAPES) ×2 IMPLANT
ELECT CAUTERY BLADE 6.4 (BLADE) ×2 IMPLANT
ELECT REM PT RETURN 9FT ADLT (ELECTROSURGICAL) ×1
ELECTRODE REM PT RTRN 9FT ADLT (ELECTROSURGICAL) ×2 IMPLANT
GAUZE 4X4 16PLY ~~LOC~~+RFID DBL (SPONGE) ×2 IMPLANT
GLIDEWIRE ADV .035X180CM (WIRE) IMPLANT
GLIDEWIRE ANGLED SS 035X260CM (WIRE) IMPLANT
GLOVE BIO SURGEON STRL SZ7 (GLOVE) ×6 IMPLANT
GOWN STRL REUS W/ TWL LRG LVL3 (GOWN DISPOSABLE) ×6 IMPLANT
GOWN STRL REUS W/ TWL XL LVL3 (GOWN DISPOSABLE) ×4 IMPLANT
GOWN STRL REUS W/TWL LRG LVL3 (GOWN DISPOSABLE) ×3
GOWN STRL REUS W/TWL XL LVL3 (GOWN DISPOSABLE) ×2
GRAFT VASC PATCH XENOSURE 1X14 (Vascular Products) ×2 IMPLANT
HEMOSTAT SURGICEL 2X3 (HEMOSTASIS) ×2 IMPLANT
IV NS 250ML (IV SOLUTION) ×1
IV NS 250ML BAXH (IV SOLUTION) ×2 IMPLANT
KIT ENCORE 26 ADVANTAGE (KITS) IMPLANT
KIT TURNOVER KIT A (KITS) ×2 IMPLANT
LABEL OR SOLS (LABEL) ×2 IMPLANT
LOOP RED MAXI  1X406MM (MISCELLANEOUS) ×2
LOOP VESSEL MAXI  1X406 RED (MISCELLANEOUS) ×2
LOOP VESSEL MAXI 1X406 RED (MISCELLANEOUS) ×4 IMPLANT
LOOP VESSEL MINI 0.8X406 BLUE (MISCELLANEOUS) ×2 IMPLANT
LOOPS BLUE MINI 0.8X406MM (MISCELLANEOUS) ×1
MANIFOLD NEPTUNE II (INSTRUMENTS) ×2 IMPLANT
NDL FILTER BLUNT 18X1 1/2 (NEEDLE) ×2 IMPLANT
NDL HYPO 25X1 1.5 SAFETY (NEEDLE) ×2 IMPLANT
NEEDLE FILTER BLUNT 18X1 1/2 (NEEDLE) ×1 IMPLANT
NEEDLE HYPO 25X1 1.5 SAFETY (NEEDLE) ×1 IMPLANT
NS IRRIG 500ML POUR BTL (IV SOLUTION) ×2 IMPLANT
PACK ANGIOGRAPHY (CUSTOM PROCEDURE TRAY) IMPLANT
PACK BASIN MAJOR ARMC (MISCELLANEOUS) ×2 IMPLANT
SET WALTER ACTIVATION W/DRAPE (SET/KITS/TRAYS/PACK) ×2 IMPLANT
SHEATH BRITE TIP 5FRX11 (SHEATH) IMPLANT
SHEATH BRITE TIP 8FRX11 (SHEATH) IMPLANT
SHEATH PINNACLE ST 7F 65CM (SHEATH) IMPLANT
SHUNT W TPORT 9FR PRUITT F3 (SHUNT) ×2 IMPLANT
SPONGE T-LAP 18X18 ~~LOC~~+RFID (SPONGE) ×4 IMPLANT
STENT LIFESTREAM 8X26X80 (Permanent Stent) IMPLANT
STENT VIABAHN 8X50X120 (Permanent Stent) IMPLANT
STENT VIABAHN5X120X8X (Permanent Stent) ×1 IMPLANT
SUT MNCRL 4-0 (SUTURE) ×1
SUT MNCRL 4-0 27XMFL (SUTURE) ×1
SUT PROLENE 6 0 BV (SUTURE) ×8 IMPLANT
SUT PROLENE 7 0 BV 1 (SUTURE) ×4 IMPLANT
SUT SILK 2 0 (SUTURE) ×1
SUT SILK 2-0 18XBRD TIE 12 (SUTURE) ×2 IMPLANT
SUT SILK 3 0 (SUTURE) ×1
SUT SILK 3-0 18XBRD TIE 12 (SUTURE) ×2 IMPLANT
SUT SILK 4 0 (SUTURE) ×1
SUT SILK 4-0 18XBRD TIE 12 (SUTURE) ×2 IMPLANT
SUT VIC AB 3-0 SH 27 (SUTURE) ×2
SUT VIC AB 3-0 SH 27X BRD (SUTURE) ×4 IMPLANT
SUTURE MNCRL 4-0 27XMF (SUTURE) ×2 IMPLANT
SYR 10ML LL (SYRINGE) ×4 IMPLANT
SYR 20ML LL LF (SYRINGE) ×2 IMPLANT
TRAP FLUID SMOKE EVACUATOR (MISCELLANEOUS) ×2 IMPLANT
TRAY FOLEY MTR SLVR 16FR STAT (SET/KITS/TRAYS/PACK) ×2 IMPLANT
TUBING CONNECTING 10 (TUBING) IMPLANT
WATER STERILE IRR 500ML POUR (IV SOLUTION) ×2 IMPLANT
WIRE G V18X300CM (WIRE) IMPLANT

## 2022-10-04 NOTE — Op Note (Signed)
[facility]   OPERATIVE NOTE  PROCEDURE:   1.  Right carotid endarterectomy with bovine pericardial arterial patch reconstruction 2.  Thoracic aortogram and right carotid artery angiogram 3.   Stent placement to the right common carotid artery times two 8 mm diameter by 5 cm length Viabahn stent 26 mm length Lifestream stent  PRE-OPERATIVE DIAGNOSIS: 1.  Right proximal internal carotid stenosis 2.  Right common carotid artery occlusion 3.  Right subclavian artery stenosis 4.  Previous stroke  POST-OPERATIVE DIAGNOSIS: same as above   SURGEON: [name], MD  ASSISTANT(S): Annalee Genta, NP  ANESTHESIA: general  ESTIMATED BLOOD LOSS: 200 cc  FINDING(S): 1.  Right carotid plaque in the internal and external carotid arteries.  Occlusion of the right common carotid artery treated stent placement  SPECIMEN(S):  Carotid plaque (sent to Pathology)  INDICATIONS:   Autumn Arnold is a 71 y.o. female who presents with right internal carotid stenosis with occlusion of the right common carotid artery.  She had a right subclavian artery stenosis so a subclavian carotid bypass was not appropriate.  It was felt that an endarterectomy of the carotid bulb and proximal internal carotid artery with concomitant stenting of the common carotid artery would be in her best interest as she has had a previous stroke and was symptomatic from this lesion.  I discussed with the patient the risks, benefits, and alternatives to carotid endarterectomy.  I discussed the differences between carotid stenting and carotid endarterectomy and why her case was somewhat different and would require essentially both modalities. I discussed the procedural details of carotid endarterectomy with the patient.  The patient is aware that the risks of carotid endarterectomy include but are not limited to: bleeding, infection, stroke, myocardial infarction, death, cranial nerve injuries both temporary and permanent,  neck hematoma, possible airway compromise, labile blood pressure post-operatively, cerebral hyperperfusion syndrome, and possible need for additional interventions in the future. The patient is aware of the risks and agrees to proceed forward with the procedure.  DESCRIPTION: After full informed written consent was obtained from the patient, the patient was brought back to the operating room and placed supine upon the operating table.  Prior to induction, the patient received IV antibiotics.  After obtaining adequate anesthesia, the patient was placed into a modified beach chair position with a shoulder roll in place and the patient's neck slightly hyperextended and rotated away from the surgical site.  The patient was prepped in the standard fashion for a carotid endarterectomy.  I made an incision anterior to the sternocleidomastoid muscle and dissected down through the subcutaneous tissue.  The platysmas was opened with electrocautery.  Then I dissected down to the internal jugular vein and facial vein.  The facial vein is ligated and divided between 2-0 silk ties.  This was dissected posteriorly until I obtained visualization of the common carotid artery.  This was dissected out and then a vessel loop was placed around the common carotid artery.  I then dissected in a periadventitial fashion along the common carotid artery up to the bifurcation.  I then identified the external carotid artery and the superior thyroid artery.  I placed a vessel loop around the superior thyroid artery, and I also dissected out the external carotid artery and placed a vessel loop around it. In the process of this dissection, the hypoglossal nerve was identified and protected from harm.  I then dissected out the internal carotid artery until I identified an area in the  internal carotid artery clearly above the stenosis.  I dissected slightly distal to this area, and placed a vessel loop around the artery.  At this point, we gave  the patient 7000 units of intravenous heparin.  After this was allowed to circulate for several minutes, I pulled up control on the vessel loops to clamp the internal carotid artery, external carotid artery, superior thyroid artery, and then the common carotid artery.  I then made an arteriotomy in the common carotid artery with a 11 blade, and extended the arteriotomy with a Potts scissor down into the common carotid artery, then I carried the arteriotomy through the bifurcation into the internal carotid artery until I reached an area that was not diseased.  A shunt could not be used due to the common carotid artery occlusion.  At this point, I started the endarterectomy in the common carotid artery with a Penfield elevator and carried this dissection down into the common carotid artery circumferentially.  Then I transected the plaque at a segment where it was adherent and transected the plaque with Potts scissors.  I then carried this dissection up into the external carotid artery.  The plaque was extracted by unclamping the external carotid artery and performing an eversion endarterectomy.  The dissection was then carried into the internal carotid artery where a nice feathered end point was created with gentle traction.  I passed the plaque off the field as a specimen. At this point I removed all loose flecks and remaining disease possible.  At this point, I was satisfied that the minimal remaining disease was densely adherent to the wall and wall integrity was intact. The distal endpoint was tacked down with a pair of 7-0 Prolene sutures.  I then fashioned a bovine pericardial arterial patch for the artery and sewed it in place with two running stitch of 6-0 Prolene.  I started at the distal endpoint and ran one half the length of the arteriotomy.  I then cut and beveled the patch to an appropriate length to match the arteriotomy.  I started the second 6-0 Prolene at the proximal end point.  The medial suture  line was completed and the lateral suture line was run approximately one quarter the length of the arteriotomy.  Prior to completing this patch angioplasty, I then turned my attention to stenting the common carotid artery.  A large chunk of plaque was pulled out of the distal common carotid artery with a hemostat and I attempted to cross this occlusion retrograde after placing an 8 French sheath.  I used an advantage and a Glidewire and a Kumpe catheter but could never cross the occlusion for retrograde access.  I then accessed the right femoral artery under direct ultrasound guidance without difficulty with a Seldinger needle.  A J-wire a 5 French sheath were placed.  A permanent image was recorded.  Additional heparin was given at this point.  Catheter was placed in the aorta and a thoracic aortogram was performed in the LAO projection showing fairly normal origins of the great vessels.  I was able to cannulate the innominate artery with a JB2 catheter.  I went to both LAO and RAO projections to splay open the innominate artery.  There was significant stenosis of greater than 80% in the right subclavian artery proximally with occlusion of the right common carotid artery at its origin.  I exchanged for a Kumpe catheter and tediously I was able to cross the occlusion with a Glidewire and a Kumpe catheter  and then bring the wire out the gap in the patch arteriotomy in the internal carotid artery.  At this point, we upsized to a long 7 [place] sheath.  I predilated the lesion with a 5 mm diameter Lutonix drug-coated angioplasty balloon.  I then exchanged for a 0.018 wire.  2 stents were required to treat the common carotid artery occlusion.  The first was an 8 mm diameter by 5 cm length Viabahn stent that was taken up to the bottom edge of our surgical patch.  An 8 mm diameter by 26 mm length Lifestream stent was then placed at the origin of the common carotid artery going down into the innominate artery just a  millimeter or 2.  The 8 mm balloon was used to post dilate the Viabahn stent as well.  Completion imaging showed both stents to be widely patent with less than 10% residual stenosis.  I then remove the wire leaving the sheath in place for completion image.  It should be noted we had distal control with clamp on the internal carotid artery throughout the procedure.  I then completed the arteriotomy.  At this point, I allowed the external carotid artery to backbleed, which was excellent.  Then I instilled heparinized saline in this patched artery and then completed the patch angioplasty in the usual fashion.  First, I released the clamp on the external carotid artery, then I released it on the common carotid artery.  After waiting a few seconds, I then released it on the internal carotid artery. Several minutes of pressure were held and 6-0 Prolene patch sutures were used as need for hemostasis.  Completion imaging performed through the sheath showed mild narrowing at the distal endpoint of the carotid endarterectomy and I gently treated this area with a 5 mm diameter by 4 cm length Lutonix drug-coated angioplasty balloon inflated to 6 atm.  Completion imaging showed no significant residual stenosis in the endarterectomy site where the previously placed stents at this point.  The 7 French sheath was removed from the right groin and StarClose closure device was deployed in the right groin with excellent hemostatic result.  At this point, I placed Surgicel and Vistacel topical hemostatic agents.  There was no more active bleeding in the surgical site.  The sternocleidomastoid space was closed with three interrupted 3-0 Vicryl sutures. I then reapproximated the platysma muscle with a running stitch of 3-0 Vicryl.  The skin was then closed with a running subcuticular 4-0 Monocryl.  The skin was then cleaned, dried and Dermabond was used to reinforce the skin closure.  The patient awakened and was taken to the recovery  room in stable condition, following commands and moving all four extremities without any apparent deficits.    COMPLICATIONS: none  CONDITION: stable  [name]  10/04/2022, 4:28 PM    This note was created with Dragon Medical transcription system. Any errors in dictation are purely unintentional.

## 2022-10-04 NOTE — Anesthesia Procedure Notes (Addendum)
Arterial Line Insertion Start/End11/30/2023 1:00 PM, 10/04/2022 1:01 PM Performed by: Andria Frames, MD, anesthesiologist  Patient location: OR. Preanesthetic checklist: patient identified, IV checked, site marked, risks and benefits discussed, surgical consent, monitors and equipment checked, pre-op evaluation, timeout performed and anesthesia consent Lidocaine 1% used for infiltration Left, radial was placed Catheter size: 20 G Hand hygiene performed , maximum sterile barriers used  and Seldinger technique used Allen's test indicative of satisfactory collateral circulation Attempts: 1 Procedure performed using ultrasound guided technique. Ultrasound Notes:anatomy identified, needle tip was noted to be adjacent to the nerve/plexus identified and no ultrasound evidence of intravascular and/or intraneural injection Following insertion, Biopatch. Post procedure assessment: normal and unchanged  Patient tolerated the procedure well with no immediate complications. Additional procedure comments: Arterial Line Placement:  Date: 10/04/2022  A time-out was completed verifying correct patient, procedure, site, positioning, and special equipment if applicable.   Allen's test was performed to ensure adequate perfusion. The patient left wrist was prepped and draped in sterile fashion.  A 20 G Arrow arterial line was introduced into the radial artery. The catheter was threaded over the guide wire and the needle was removed with appropriate pulsatile blood return. The catheter was then secured with a sterile Tegaderm dressing. Perfusion to the extremity distal to the point of catheter insertion was checked and found to be adequate. Attending was present for the entire procedure.  Estimated Blood Loss: minimal  The patient tolerated the procedure well and there were no immediate complications  .

## 2022-10-04 NOTE — Transfer of Care (Signed)
Immediate Anesthesia Transfer of Care Note  Patient: Autumn Arnold  Procedure(s) Performed: ENDARTERECTOMY CAROTID (Right) INSERTION OF RETROGRADE CAROTID STENT (Right)  Patient Location: PACU  Anesthesia Type:General  Level of Consciousness: awake  Airway & Oxygen Therapy: Patient Spontanous Breathing and Patient connected to nasal cannula oxygen  Post-op Assessment: Report given to RN, Post -op Vital signs reviewed and stable, and Patient moving all extremities X 4  Post vital signs: Reviewed  Last Vitals:  Vitals Value Taken Time  BP    Temp    Pulse 80 10/04/22 1638  Resp    SpO2 99 % 10/04/22 1638  Vitals shown include unvalidated device data.  Last Pain:         Complications: No notable events documented.

## 2022-10-04 NOTE — Anesthesia Preprocedure Evaluation (Signed)
Anesthesia Evaluation  Patient identified by MRN, date of birth, ID band Patient awake    Reviewed: Allergy & Precautions, NPO status , Patient's Chart, lab work & pertinent test results  History of Anesthesia Complications Negative for: history of anesthetic complications  Airway Mallampati: III  TM Distance: <3 FB Neck ROM: full    Dental  (+) Chipped, Upper Dentures   Pulmonary asthma , sleep apnea , COPD, Current Smoker   Pulmonary exam normal        Cardiovascular Exercise Tolerance: Good hypertension, (-) angina + CAD  Normal cardiovascular exam     Neuro/Psych  Neuromuscular disease CVA  negative psych ROS   GI/Hepatic Neg liver ROS,GERD  Controlled,,  Endo/Other  negative endocrine ROSdiabetes, Type 2    Renal/GU Renal disease     Musculoskeletal   Abdominal   Peds  Hematology negative hematology ROS (+)   Anesthesia Other Findings Past Medical History: No date: Anxiety No date: Aortic atherosclerosis (HCC) No date: Arthritis No date: Asthma No date: Ataxic gait     Comment:  a.) requires gait aids for assitance No date: Atherosclerotic PVD with intermittent claudication ([place])     Comment:  a.) s/p LEFT femoral endartarectomy and LEFT iliac stent              04/11/2022 11/07/2015: Bilateral carotid artery disease ([place])     Comment:  a.) doppler 11/07/2015: <50 BICA; b.) doppler               06/23/2021: near occ RICA< < 51% LICA; c.) doppler               04/05/2022: occluded RICA, mod plaque LICA No date: Bilateral lower extremity edema No date: CAD (coronary artery disease) No date: Chronic cough No date: COPD (chronic obstructive pulmonary disease) ([place]) 11/07/2015: CVA (cerebral vascular accident) ([place])     Comment:  a.) posterior superior LEFT lenticular nucleus to               posterior LEFT corona radiata No date: DDD (degenerative disc disease), cervical     Comment:  a.) s/p C5-C6  fusion No date: DOE (dyspnea on exertion) No date: GERD (gastroesophageal reflux disease) 2017: History of bilateral cataract extraction No date: History of left cataract extraction No date: Hyperlipidemia No date: Hypertension 06/23/2021: Ischemic cerebrovascular accident (CVA) ([place])     Comment:  a.) superior LEFT frontal and parietal ischemia 04/04/2022: Ischemic cerebrovascular accident (CVA) ([place])     Comment:  a.) subcentimeter cortical/subcortical aspect of RIGHT               frontoparietal region --> acute to early subacte ischemic              infarct No date: Long term current use of antithrombotics/antiplatelets     Comment:  a.) DAPT (ASA + clopidogrel) No date: Lower extremity weakness No date: 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[place])     Comment:  a.) s/p PTA and stenting 06/23/2022 --> SMA,               mid-infrarenal, BILATERAL CIA; b.) s/p PTA and stenting               09/25/2022: SMA, BILATERAL CIA, infrarenal aorta, LEFT               CFA No date: OSA on CPAP No date: PAF (paroxysmal atrial fibrillation) ([place]) 02/09/2010: Right-sided lacunar infarction ([place])     Comment:  a.) non-hemorrhagic No  date: Small fiber neuropathy No date: T2DM (type 2 diabetes mellitus) ([place]) No date: Vitamin D deficiency No date: Wheezing  Past Surgical History: No date: ABDOMINAL HYSTERECTOMY No date: ANTERIOR FUSION CERVICAL SPINE No date: CARPAL TUNNEL RELEASE; Bilateral 03/12/2016: CATARACT EXTRACTION W/PHACO; Left     Comment:  Procedure: CATARACT EXTRACTION PHACO AND INTRAOCULAR               LENS PLACEMENT (IOC);  Surgeon: Estill Cotta, MD;                Location: [facility];  Service: Ophthalmology;  Laterality:              Left;  [place] 01:12 AP% 23.4 CDE 29.83 fluid pack lot #               [id number] 04/16/2016: CATARACT EXTRACTION W/PHACO; Right     Comment:  Procedure: CATARACT EXTRACTION PHACO AND INTRAOCULAR               LENS PLACEMENT (IOC);  Surgeon: Estill Cotta, MD;                Location: [facility];  Service: Ophthalmology;  Laterality:              Right;  [place] 01:17 AP% 22.5 CDE 35.08 Fluid pack lot #               [id number] No date: CESAREAN SECTION 08/12/2018: COLONOSCOPY WITH PROPOFOL; N/A     Comment:  Procedure: COLONOSCOPY WITH PROPOFOL;  Surgeon: [place],               Benay Pike, MD;  Location: [facility];  Service:               Gastroenterology;  Laterality: N/A; No date: ELBOW SURGERY     Comment:  Tendonitis 04/11/2022: ENDARTERECTOMY FEMORAL; Left     Comment:  Procedure: ENDARTERECTOMY FEMORAL;  Surgeon: Algernon Huxley, MD;  Location: [facility];  Service: Vascular;                Laterality: Left; 08/12/2018: ESOPHAGOGASTRODUODENOSCOPY (EGD) WITH PROPOFOL; N/A     Comment:  Procedure: ESOPHAGOGASTRODUODENOSCOPY (EGD) WITH               PROPOFOL;  Surgeon: Toledo, Benay Pike, MD;  Location:               [facility];  Service: Gastroenterology;  Laterality:               N/A; No date: INCONTINENCE SURGERY 04/11/2022: INSERTION OF ILIAC STENT; Left     Comment:  Procedure: INSERTION OF ILIAC STENT;  Surgeon: Algernon Huxley, MD;  Location: [facility];  Service: Vascular;                Laterality: Left; 06/23/2021: LOWER EXTREMITY ANGIOGRAPHY; Left     Comment:  Procedure: Lower Extremity Angiography;  Surgeon:               Katha Cabal, MD;  Location: [place] [facility];               Service: Cardiovascular;  Laterality: Left; 04/05/2022: LOWER EXTREMITY ANGIOGRAPHY; Left     Comment:  Procedure: Lower Extremity Angiography;  Surgeon: Lucky Cowboy,  Erskine Squibb, MD;  Location: [facility];  Service:               Cardiovascular;  Laterality: Left; 06/25/2022: LOWER EXTREMITY ANGIOGRAPHY; Left     Comment:  Procedure: Lower Extremity Angiography;  Surgeon: Algernon Huxley, MD;  Location: [place] [facility];  Service:               Cardiovascular;   Laterality: Left; 07/11/2022: LOWER EXTREMITY ANGIOGRAPHY; Left     Comment:  Procedure: Lower Extremity Angiography;  Surgeon: Algernon Huxley, MD;  Location: [place] [facility];  Service:               Cardiovascular;  Laterality: Left; No date: TONSILLECTOMY  BMI    Body Mass Index: 25.96 kg/m      Reproductive/Obstetrics negative OB ROS                             Anesthesia Physical Anesthesia Plan  ASA: 3  Anesthesia Plan: General ETT   Post-op Pain Management:    Induction: Intravenous  PONV Risk Score and Plan: Ondansetron, Dexamethasone, Midazolam and Treatment may vary due to age or medical condition  Airway Management Planned: Oral ETT  Additional Equipment: Arterial line  Intra-op Plan:   Post-operative Plan: Extubation in OR  Informed Consent: I have reviewed the patients History and Physical, chart, labs and discussed the procedure including the risks, benefits and alternatives for the proposed anesthesia with the patient or authorized representative who has indicated his/her understanding and acceptance.     Dental Advisory Given  Plan Discussed with: Anesthesiologist, CRNA and Surgeon  Anesthesia Plan Comments: (Patient consented for risks of anesthesia including but not limited to:  - adverse reactions to medications - damage to eyes, teeth, lips or other oral mucosa - nerve damage due to positioning  - sore throat or hoarseness - Damage to heart, brain, nerves, lungs, other parts of body or loss of life  Patient voiced understanding.)       Anesthesia Quick Evaluation

## 2022-10-04 NOTE — Anesthesia Procedure Notes (Signed)
Procedure Name: Intubation Date/Time: 10/04/2022 12:54 PM  Performed by: Biagio Borg, CRNAPre-anesthesia Checklist: Patient identified, Emergency Drugs available, Suction available and Patient being monitored Patient Re-evaluated:Patient Re-evaluated prior to induction Oxygen Delivery Method: Circle system utilized Preoxygenation: Pre-oxygenation with 100% oxygen Induction Type: IV induction Ventilation: Mask ventilation without difficulty Laryngoscope Size: McGraph and 3 Grade View: Grade I Tube type: Oral Tube size: 7.0 mm Number of attempts: 1 Airway Equipment and Method: Stylet Placement Confirmation: ETT inserted through vocal cords under direct vision, positive ETCO2 and breath sounds checked- equal and bilateral Secured at: 22 cm Tube secured with: Tape Dental Injury: Teeth and Oropharynx as per pre-operative assessment

## 2022-10-04 NOTE — H&P (Signed)
[facility] Admission History & Physical  MRN : [id number]  Autumn Arnold is a 71 y.o. (January 05, 1951) female who presents with chief complaint of No chief complaint on file. Marland Kitchen  History of Present Illness: Patient presents today for carotid surgery.  Have had this plan for many months, but her lower extremity symptoms delayed treatment.  Previous history of stroke.  Current Facility-Administered Medications  Medication Dose Route Frequency Provider Last Rate Last Admin   0.9 %  sodium chloride infusion   Intravenous Continuous Piscitello, Precious Haws, MD 50 mL/hr at 10/04/22 1147 New Bag at 10/04/22 1147   ceFAZolin (ANCEF) 2-4 GM/100ML-% IVPB            ceFAZolin (ANCEF) IVPB 2g/100 mL premix  2 g Intravenous On Call to [place], [place], NP       Chlorhexidine Gluconate Cloth 2 % PADS 6 each  6 each Topical Once Kris Hartmann, NP       And   Chlorhexidine Gluconate Cloth 2 % PADS 6 each  6 each Topical Once Kris Hartmann, NP       lactated ringers infusion   Intravenous Continuous Dimas Millin, MD        Past Medical History:  Diagnosis Date   Anxiety    Aortic atherosclerosis ([place])    Arthritis    Asthma    Ataxic gait    a.) requires gait aids for assitance   Atherosclerotic PVD with intermittent claudication ([place])    a.) s/p LEFT femoral endartarectomy and LEFT iliac stent 04/11/2022   Bilateral carotid artery disease ([place]) 11/07/2015   a.) doppler 11/07/2015: <50 BICA; b.) doppler 06/23/2021: near occ RICA< < 32% LICA; c.) doppler 95/18/8416: occluded RICA, mod plaque LICA   Bilateral lower extremity edema    CAD (coronary artery disease)    Chronic cough    COPD (chronic obstructive pulmonary disease) (HCC)    CVA (cerebral vascular accident) ([place]) 11/07/2015   a.) posterior superior LEFT lenticular nucleus to posterior LEFT corona radiata   DDD (degenerative disc disease), cervical    a.) s/p C5-C6 fusion   DOE (dyspnea on exertion)    GERD  (gastroesophageal reflux disease)    History of bilateral cataract extraction 2017   History of left cataract extraction    Hyperlipidemia    Hypertension    Ischemic cerebrovascular accident (CVA) ([place]) 06/23/2021   a.) superior LEFT frontal and parietal ischemia   Ischemic cerebrovascular accident (CVA) ([place]) 04/04/2022   a.) subcentimeter cortical/subcortical aspect of RIGHT frontoparietal region --> acute to early subacte ischemic infarct   Long term current use of antithrombotics/antiplatelets    a.) DAPT (ASA + clopidogrel)   Lower extremity weakness    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[place])    a.) s/p PTA and stenting 06/23/2022 --> SMA, mid-infrarenal, BILATERAL CIA; b.) s/p PTA and stenting 09/25/2022: SMA, BILATERAL CIA, infrarenal aorta, LEFT CFA   OSA on CPAP    PAF (paroxysmal atrial fibrillation) (HCC)    Right-sided lacunar infarction ([place]) 02/09/2010   a.) non-hemorrhagic   Small fiber neuropathy    T2DM (type 2 diabetes mellitus) ([place])    Vitamin D deficiency    Wheezing     Past Surgical History:  Procedure Laterality Date   ABDOMINAL HYSTERECTOMY     ANTERIOR FUSION CERVICAL SPINE     CARPAL TUNNEL RELEASE Bilateral    CATARACT EXTRACTION W/PHACO Left 03/12/2016   Procedure: CATARACT EXTRACTION PHACO AND INTRAOCULAR LENS  PLACEMENT (IOC);  Surgeon: Estill Cotta, MD;  Location: [facility];  Service: Ophthalmology;  Laterality: Left;  [place] 01:12 AP% 23.4 CDE 29.83 fluid pack lot # [id number]   CATARACT EXTRACTION W/PHACO Right 04/16/2016   Procedure: CATARACT EXTRACTION PHACO AND INTRAOCULAR LENS PLACEMENT (IOC);  Surgeon: Estill Cotta, MD;  Location: [facility];  Service: Ophthalmology;  Laterality: Right;  [place] 01:17 AP% 22.5 CDE 35.08 Fluid pack lot # [id number]   CESAREAN SECTION     COLONOSCOPY WITH PROPOFOL N/A 08/12/2018   Procedure: COLONOSCOPY WITH PROPOFOL;  Surgeon: Tol[place], Benay Pike, MD;  Location: [facility];  Service: Gastroenterology;  Laterality:  N/A;   ELBOW SURGERY     Tendonitis   ENDARTERECTOMY FEMORAL Left 04/11/2022   Procedure: ENDARTERECTOMY FEMORAL;  Surgeon: Algernon Huxley, MD;  Location: [facility];  Service: Vascular;  Laterality: Left;   ESOPHAGOGASTRODUODENOSCOPY (EGD) WITH PROPOFOL N/A 08/12/2018   Procedure: ESOPHAGOGASTRODUODENOSCOPY (EGD) WITH PROPOFOL;  Surgeon: Toledo, Benay Pike, MD;  Location: [facility];  Service: Gastroenterology;  Laterality: N/A;   INCONTINENCE SURGERY     INSERTION OF ILIAC STENT Left 04/11/2022   Procedure: INSERTION OF ILIAC STENT;  Surgeon: Algernon Huxley, MD;  Location: [facility];  Service: Vascular;  Laterality: Left;   LOWER EXTREMITY ANGIOGRAPHY Left 06/23/2021   Procedure: Lower Extremity Angiography;  Surgeon: Katha Cabal, MD;  Location: [facility];  Service: Cardiovascular;  Laterality: Left;   LOWER EXTREMITY ANGIOGRAPHY Left 04/05/2022   Procedure: Lower Extremity Angiography;  Surgeon: Algernon Huxley, MD;  Location: [facility];  Service: Cardiovascular;  Laterality: Left;   LOWER EXTREMITY ANGIOGRAPHY Left 06/25/2022   Procedure: Lower Extremity Angiography;  Surgeon: Algernon Huxley, MD;  Location: [facility];  Service: Cardiovascular;  Laterality: Left;   LOWER EXTREMITY ANGIOGRAPHY Left 07/11/2022   Procedure: Lower Extremity Angiography;  Surgeon: Algernon Huxley, MD;  Location: [facility];  Service: Cardiovascular;  Laterality: Left;   TONSILLECTOMY       Social History   Tobacco Use   Smoking status: Every Day    Packs/day: 1.00    Types: Cigarettes   Smokeless tobacco: Never  Vaping Use   Vaping Use: Never used  Substance Use Topics   Alcohol use: Not Currently    Comment: occ   Drug use: No     Family History  Problem Relation Age of Onset   CAD Mother    Hypertension Mother    Arthritis Mother    Diabetes Father    CAD Father     Allergies  Allergen Reactions   Lisinopril Other (See Comments)    Tongue burning  sensation   Plavix [Clopidogrel] Other (See Comments)    Pt denies allergy.     REVIEW OF SYSTEMS (Negative unless checked)  Constitutional: '[]'$ Weight loss  '[]'$ Fever  '[]'$ Chills Cardiac: '[]'$ Chest pain   '[]'$ Chest pressure   '[]'$ Palpitations   '[]'$ Shortness of breath when laying flat   '[]'$ Shortness of breath at rest   '[]'$ Shortness of breath with exertion. Vascular:  '[x]'$ Pain in legs with walking   '[x]'$ Pain in legs at rest   '[]'$ Pain in legs when laying flat   '[]'$ Claudication   '[]'$ Pain in feet when walking  '[]'$ Pain in feet at rest  '[]'$ Pain in feet when laying flat   '[]'$ History of DVT   '[]'$ Phlebitis   '[]'$ Swelling in legs   '[]'$ Varicose veins   '[]'$ Non-healing ulcers Pulmonary:   '[]'$ Uses home oxygen   '[]'$ Productive cough   '[]'$   Hemoptysis   '[]'$ Wheeze  '[]'$ COPD   '[]'$ Asthma Neurologic:  '[]'$ Dizziness  '[]'$ Blackouts   '[]'$ Seizures   '[]'$ History of stroke   '[]'$ History of TIA  '[]'$ Aphasia   '[]'$ Temporary blindness   '[]'$ Dysphagia   '[]'$ Weakness or numbness in arms   '[x]'$ Weakness or numbness in legs Musculoskeletal:  '[]'$ Arthritis   '[]'$ Joint swelling   '[]'$ Joint pain   '[]'$ Low back pain Hematologic:  '[]'$ Easy bruising  '[]'$ Easy bleeding   '[]'$ Hypercoagulable state   '[]'$ Anemic  '[]'$ Hepatitis Gastrointestinal:  '[]'$ Blood in stool   '[]'$ Vomiting blood  '[]'$ Gastroesophageal reflux/heartburn   '[]'$ Difficulty swallowing. Genitourinary:  '[]'$ Chronic kidney disease   '[]'$ Difficult urination  '[]'$ Frequent urination  '[]'$ Burning with urination   '[]'$ Blood in urine Skin:  '[]'$ Rashes   '[]'$ Ulcers   '[]'$ Wounds Psychological:  '[]'$ History of anxiety   '[]'$  History of major depression.  Physical Examination  Vitals:   10/04/22 1113  BP: (!) 157/67  Pulse: 96  Resp: 16  Temp: (!) 97.4 F (36.3 C)  TempSrc: Tympanic  SpO2: 100%  Weight: 60.3 kg  Height: 5' (1.524 m)   Body mass index is 25.96 kg/m. Gen: WD/WN, NAD Head: [place]/AT, No temporalis wasting.  Ear/Nose/Throat: Hearing grossly intact, nares w/o erythema or drainage, oropharynx w/o Erythema/Exudate,  Eyes: Conjunctiva clear, sclera  non-icteric Neck: Trachea midline.  No JVD.  Pulmonary:  Good air movement, respirations not labored, no use of accessory muscles.  Cardiac: RRR, normal S1, S2. Vascular:  Vessel Right Left  Radial Palpable Palpable                          PT 2+ Palpable 1+ Palpable  DP 1+ Palpable 1+ Palpable    Musculoskeletal: M/S 5/5 throughout.  Extremities without ischemic changes.  No deformity or atrophy.  Neurologic: Sensation grossly intact in extremities.  Symmetrical.  Speech is fluent. Motor exam as listed above. Psychiatric: Judgment intact, Mood & affect appropriate for pt's clinical situation. Dermatologic: No rashes or ulcers noted.  No cellulitis or open wounds.      CBC Lab Results  Component Value Date   WBC 6.3 09/25/2022   HGB 13.0 09/25/2022   HCT 39.5 09/25/2022   MCV 85.9 09/25/2022   PLT 211 09/25/2022    BMET    Component Value Date/Time   NA 141 09/25/2022 1033   K 4.1 09/25/2022 1033   CL 105 09/25/2022 1033   CO2 27 09/25/2022 1033   GLUCOSE 145 (H) 09/25/2022 1033   BUN 12 09/25/2022 1033   CREATININE 0.85 09/25/2022 1033   CALCIUM 9.5 09/25/2022 1033   GFRNONAA >60 09/25/2022 1033   GFRAA >60 11/08/2015 0419   Estimated Creatinine Clearance: 50.7 mL/min (by C-G formula based on SCr of 0.85 mg/dL).  COAG Lab Results  Component Value Date   INR 1.0 06/22/2021   INR 0.89 11/07/2015    Radiology VAS [place] LOWER EXTREMITY ARTERIAL DUPLEX  Result Date: 10/03/2022 LOWER EXTREMITY ARTERIAL DUPLEX STUDY Patient Name:  MANVIR THORSON  Date of Exam:   09/25/2022 Medical Rec #: [id number]       Accession #:    [id number] Date of Birth: 1951-05-23        Patient Gender: F Patient Age:   24 years Exam Location:  [facility] Procedure:      VAS [place] LOWER EXTREMITY ARTERIAL DUPLEX Referring Phys: Eulogio Ditch --------------------------------------------------------------------------------  Indications: Peripheral artery disease. High Risk Factors:  Hypertension, hyperlipidemia, prior CVA. Other Factors: Left leg pain.  Vascular  Interventions: 04/11/2022: Left CFA,PFA and SFA Endartectomies and                         Patch Angioplasty. Catheter placement into Aorta from                         Bilateral Femoral approaches. US guidance for Vascular                         access Right Femoral Artery. Stent placement to                         Bilateral CIA with 6 mm diameter by 37 cm length                         Lifestream stent on the Right and 6 mm diameter by 58 mm                         length lifestream stent on the Left. Angioplasty of the                         Infrarenal Abdominal Aorta with 6 mm balloons from both                         sides. Additional stent placement to the Left EIA with a                         pair of 6 mm diameter by 58 mm length lifestream stents                         and a 7 mm diameter by 50 mm length Viabahn stent.                         07/11/2022 Percutaneous transluminal angioplasty of left                         anterior tibial artery and distal popliteal artery with                         3 mm diameter angioplasty balloon                         5. Percutaneous transluminal angioplasty of left                         superficial femoral artery with 5 mm diameter Lutonix                         drug-coated angioplasty balloon                         6. Stent placement to the proximal left SFA with 6 mm                         diameter by 7.5 cm length Viabahn stent  7. Percutaneous transluminal angioplasty of the left                         tibioperoneal trunk and proximal posterior tibial artery                         with 3 mm diameter angioplasty balloon. Current ABI:            Not obtained at this time Performing Technologist: Delorise Shiner RVT  Examination Guidelines: A complete evaluation includes B-mode imaging, spectral Doppler, color Doppler, and power Doppler as needed of all  accessible portions of each vessel. Bilateral testing is considered an integral part of a complete examination. Limited examinations for reoccurring indications may be performed as noted.   +----------+--------+-----+--------+---------+--------+ LEFT      PSV cm/sRatioStenosisWaveform Comments +----------+--------+-----+--------+---------+--------+ CFA Distal182                  triphasic         +----------+--------+-----+--------+---------+--------+ DFA       75                   biphasic          +----------+--------+-----+--------+---------+--------+ SFA Prox  94                   biphasic          +----------+--------+-----+--------+---------+--------+ SFA Mid   146                  biphasic          +----------+--------+-----+--------+---------+--------+ SFA Distal78                   biphasic          +----------+--------+-----+--------+---------+--------+ POP Prox  114                  biphasic          +----------+--------+-----+--------+---------+--------+ POP Distal127                  biphasic          +----------+--------+-----+--------+---------+--------+ ATA Distal42                   biphasic          +----------+--------+-----+--------+---------+--------+ PTA Mid   25                   biphasic          +----------+--------+-----+--------+---------+--------+ PTA Distal0            occludedabsent            +----------+--------+-----+--------+---------+--------+  Summary: Left: Total occlusion noted in the dorsal pedis artery. No focal stenosis identified.  See table(s) above for measurements and observations. Electronically signed by [name] MD on 10/03/2022 at 2:46:38 PM.    Final      Assessment/Plan 1. Atherosclerosis of native artery of left leg with rest pain ([place]) Recommend:   The patient is status post successful angiogram with intervention.  The patient reports that the claudication symptoms and leg pain  has improved.   The patient denies lifestyle limiting changes at this point in time.   No further invasive studies, angiography or surgery at this time The patient should continue walking and begin a more formal exercise program.  The patient should continue antiplatelet therapy and aggressive treatment of the lipid abnormalities  Continued surveillance is indicated as atherosclerosis is likely to progress with time.     Patient should undergo noninvasive studies as ordered. The patient will follow up with me to review the studies.     2. Bilateral carotid artery stenosis Once her left groin wound has healed, considering a right carotid endarterectomy with retrograde stenting of the right common carotid artery and potentially a right subclavian stent as well.  this is planned for today.   3. Type 2 diabetes mellitus with hyperlipidemia ([place]) Continue hypoglycemic medications as already ordered, these medications have been reviewed and there are no changes at this time.   Hgb A1C to be monitored as already arranged by primary service    4. Mixed hyperlipidemia Continue statin as ordered and reviewed, no changes at this time    [name], MD  10/04/2022 12:44 PM

## 2022-10-04 NOTE — Progress Notes (Signed)
Patient awake and alert x4. Moving all ext, noted left upper and lower weaker then right: baseline, CVA 06/2021.  Patient ordered plavix post-op: administered.  Not allergic to plavix. Left radial aline intact, patent, pulses intact. Right neck area with dermabond c/d/I Right groin area with star closure/safe guard intact with 59m air. No s/s hematoma/bleeding. Speech clear, Miss MGarceaualert, talkative, able to communicate needs without event.  Family updated.

## 2022-10-05 LAB — BASIC METABOLIC PANEL
Anion gap: 7 (ref 5–15)
BUN: 12 mg/dL (ref 8–23)
CO2: 19 mmol/L — ABNORMAL LOW (ref 22–32)
Calcium: 7.8 mg/dL — ABNORMAL LOW (ref 8.9–10.3)
Chloride: 109 mmol/L (ref 98–111)
Creatinine, Ser: 0.87 mg/dL (ref 0.44–1.00)
GFR, Estimated: >60 mL/min (ref >60)
Glucose, Bld: 208 mg/dL — ABNORMAL HIGH (ref 70–99)
Potassium: 4.3 mmol/L (ref 3.5–5.1)
Sodium: 135 mmol/L (ref 135–145)

## 2022-10-05 LAB — CBC
HCT: 27.3 % — ABNORMAL LOW (ref 36.0–46.0)
Hemoglobin: 9.1 g/dL — ABNORMAL LOW (ref 12.0–15.0)
MCH: 29.2 pg (ref 26.0–34.0)
MCHC: 33.3 g/dL (ref 30.0–36.0)
MCV: 87.5 fL (ref 80.0–100.0)
Platelets: 170 10*3/uL (ref 150–400)
RBC: 3.12 MIL/uL — ABNORMAL LOW (ref 3.87–5.11)
RDW: 15.9 % — ABNORMAL HIGH (ref 11.5–15.5)
WBC: 10.8 10*3/uL — ABNORMAL HIGH (ref 4.0–10.5)
nRBC: 0 % (ref 0.0–0.2)

## 2022-10-05 LAB — GLUCOSE, CAPILLARY: Glucose-Capillary: 148 mg/dL — ABNORMAL HIGH (ref 70–99)

## 2022-10-05 IMAGING — CT CT ANGIO HEAD-NECK (W OR W/O PERF)
1 of 11 series · 5 of 33 positions shown · non-contrast
Comparison: Recent CT and MR imaging, CTA neck June 2021, MRA
June 2021

CLINICAL DATA: Stroke/TIA, determine embolic source

EXAM:
CT ANGIOGRAPHY HEAD AND NECK
TECHNIQUE: Multidetector CT imaging of the head and neck was performed using
the standard protocol during bolus administration of intravenous
contrast. Multiplanar CT image reconstructions and MIPs were
obtained to evaluate the vascular anatomy. Carotid stenosis
measurements (when applicable) are obtained utilizing NASCET
criteria, using the distal internal carotid diameter as the
denominator.

[Series 10: ax thins · axial · 0.44mm/px · z∈[-287,-62]mm · 5 of 339 slices shown]
[im 57/339  soft-tissue]
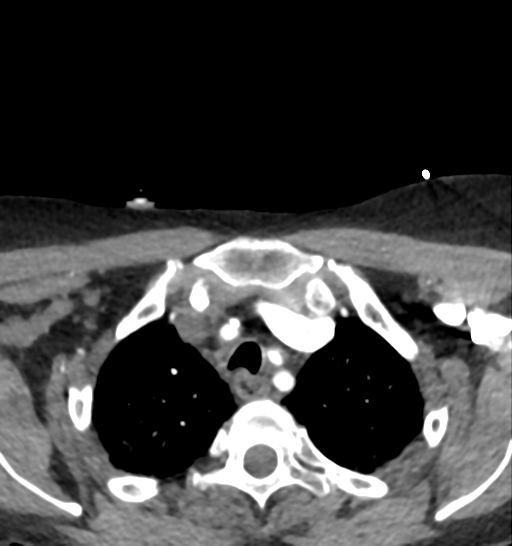
[im 113/339  bone]
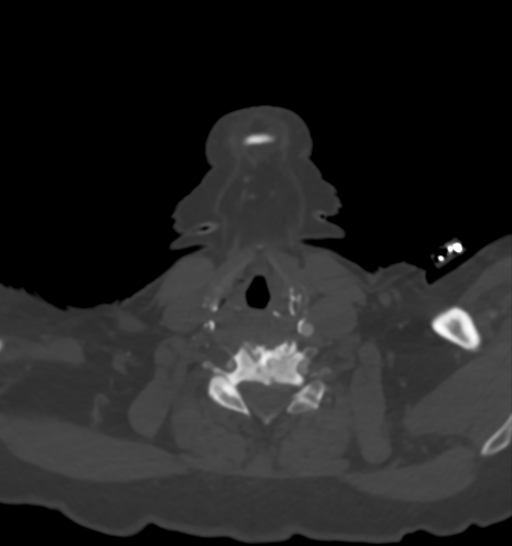
[im 170/339  soft-tissue]
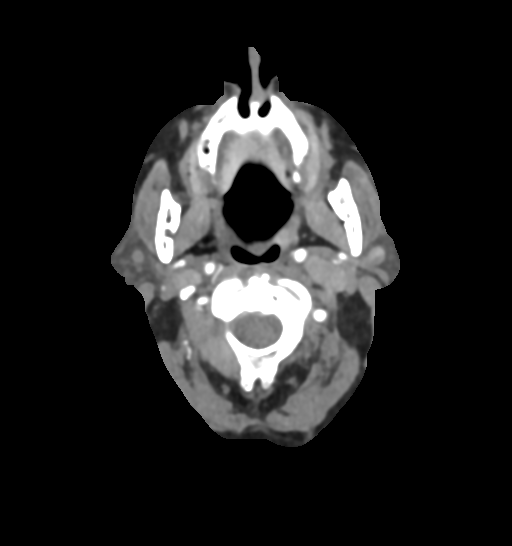
[im 226/339  bone]
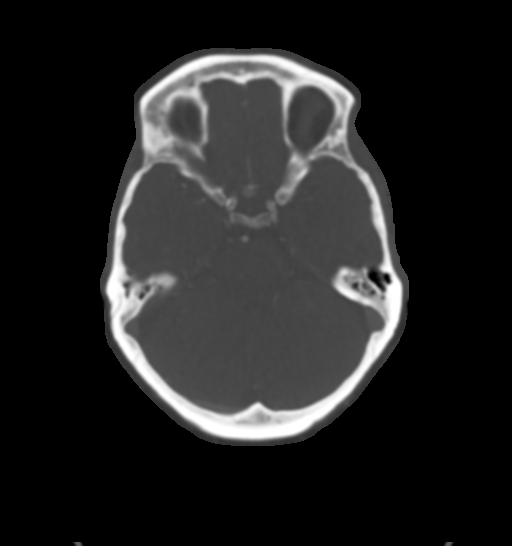
[im 282/339  soft-tissue]
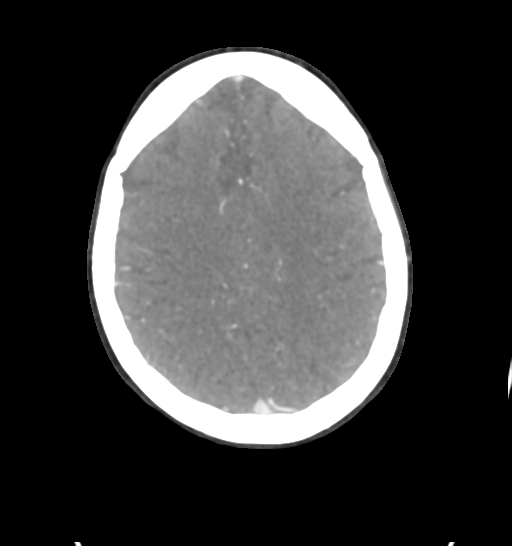

[5 of 33 positions shown; findings below may reference images not displayed]

RADIATION DOSE REDUCTION: This exam was performed according to the
departmental dose-optimization program which includes automated
exposure control, adjustment of the mA and/or kV according to
patient size and/or use of iterative reconstruction technique.

CONTRAST:  75mL OMNIPAQUE IOHEXOL 350 MG/ML SOLN
FINDINGS: CT HEAD

Brain: There is no acute intracranial hemorrhage, mass effect, or
edema. No new loss of gray-white differentiation. Chronic right
thalamic infarct. Chronic infarct of the left basal ganglia and
adjacent white matter. There is no extra-axial fluid collection.
Ventricles and sulci are stable in size and configuration.

Vascular: No new finding.

Skull: Calvarium is unremarkable.

Sinuses/Orbits: No acute finding.

Other: None.

Review of the MIP images confirms the above findings

CTA NECK

Aortic arch: Mixed plaque along the arch and great vessel origins.
As before, there is moderate to marked stenosis of the right
subclavian proximally.

Right carotid system: Common carotid remains occluded just beyond
the origin. Reconstitution just below the bifurcation. There is
mixed plaque extending along the proximal internal carotid with
stable less than 50% stenosis.

Left carotid system: Patent. Mixed plaque along the common carotid
with less than 50% stenosis. Mixed plaque along the proximal
internal carotid causing approximately 50% stenosis.

Vertebral arteries: Patent. Plaque at the origins causing mild to
moderate stenosis. Left vertebral is mildly dominant.

Skeleton: Cervical spine degenerative changes are similar in
appearance.

Other neck: Unremarkable.

Upper chest: Refer to same day chest CT

Review of the MIP images confirms the above findings

CTA HEAD

Anterior circulation: Intracranial internal carotid arteries are
patent with calcified plaque. There is moderate stenosis of the
supraclinoid right ICA.

Posterior circulation: Intracranial vertebral arteries are patent
with plaque causing mild stenosis. Basilar artery is patent.
Posterior cerebral arteries are patent. A right posterior
communicating artery is identified.

Venous sinuses: Patent as allowed by contrast bolus timing.

Review of the MIP images confirms the above findings
IMPRESSION: No acute intracranial abnormality.

Unchanged occlusion of the right common carotid just beyond its
origin to just proximal to the bifurcation. No new occlusion or
hemodynamically significant stenosis. Plaque at the proximal right
ICA causes less than 50% stenosis. Plaque at the proximal left ICA
causes 50% stenosis.

## 2022-10-05 IMAGING — CT CT CHEST W/ CM
2 of 4 series · 15 of 36 positions shown, 18 images · IV contrast (APPLIED)
Comparison: Chest x-ray 04/03/2022.  Chest CT 06/22/2021

CLINICAL DATA: Abnormal chest x-ray

EXAM:
CT CHEST WITH CONTRAST
TECHNIQUE: Multidetector CT imaging of the chest was performed during
intravenous contrast administration.

[Series 2: chest w · axial · 0.81mm/px · z∈[-751,-507]mm · 12 of 146 slices shown, 15 images]
[im 12/146  mediastinal]
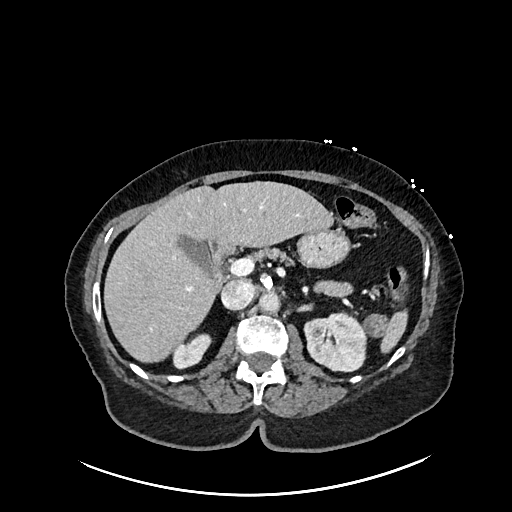
[im 12/146  lung]
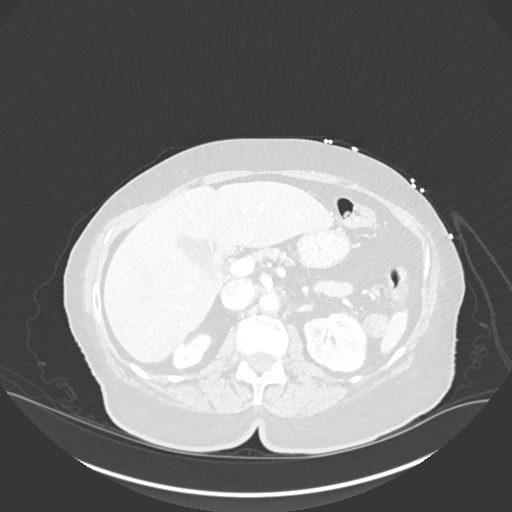
[im 23/146  lung]
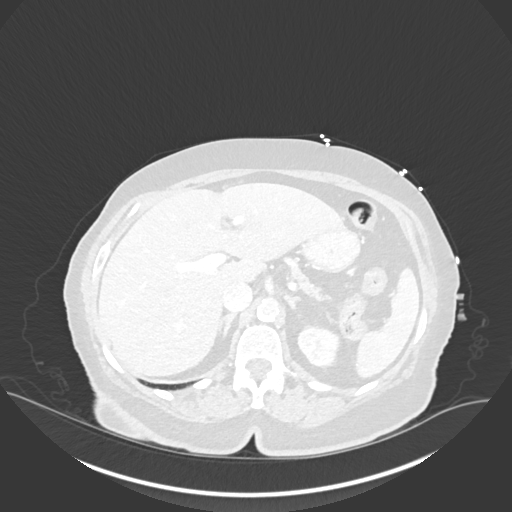
[im 34/146  lung]
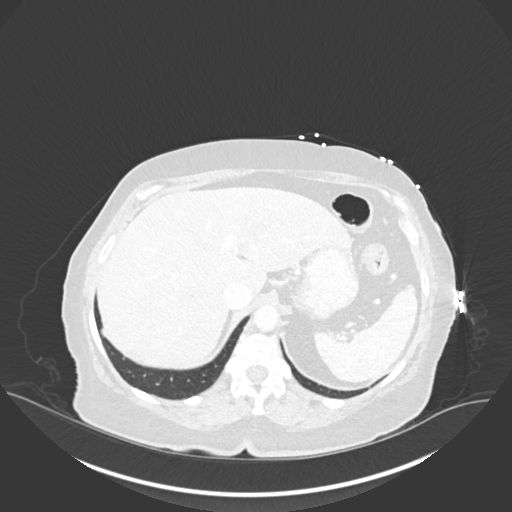
[im 45/146  lung]
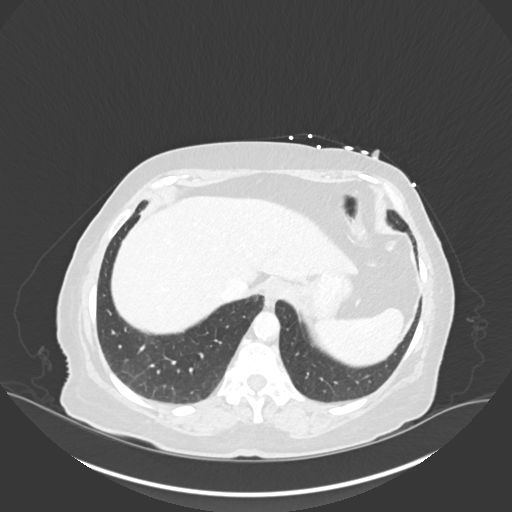
[im 56/146  mediastinal]
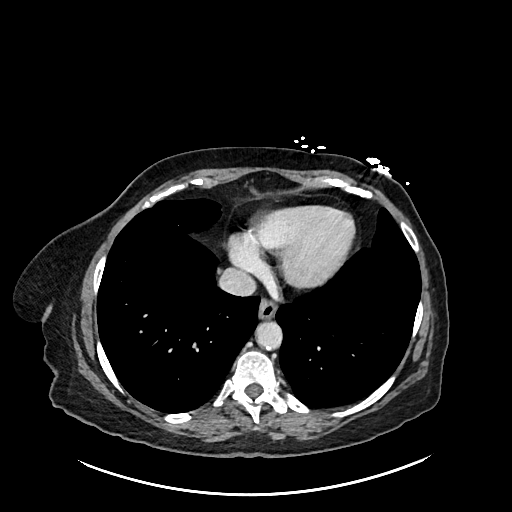
[im 56/146  lung]
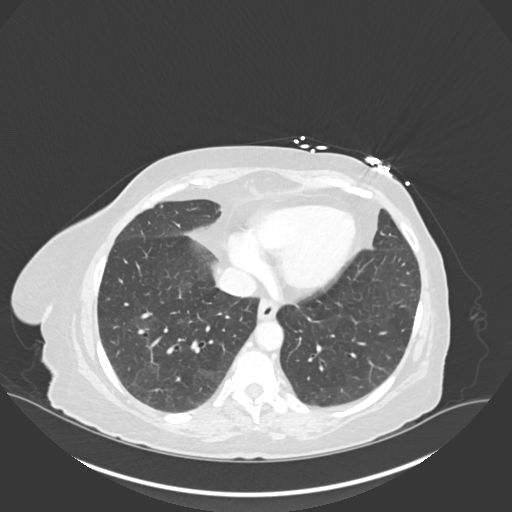
[im 67/146  lung]
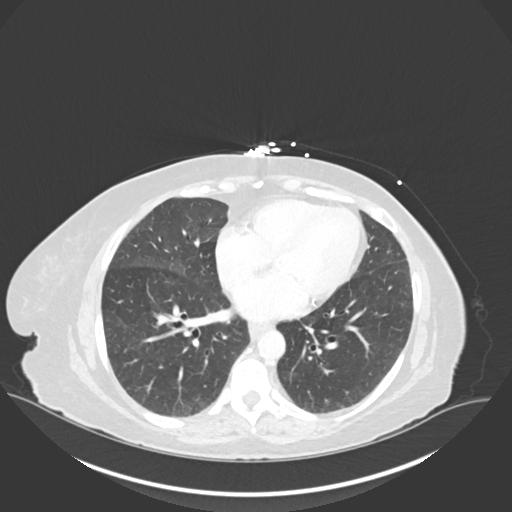
[im 79/146  lung]
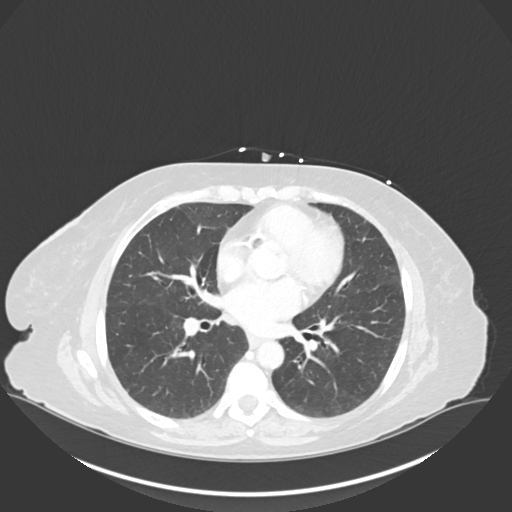
[im 90/146  lung]
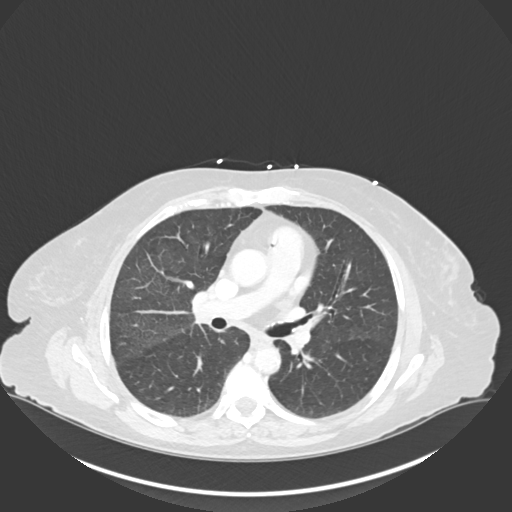
[im 101/146  mediastinal]
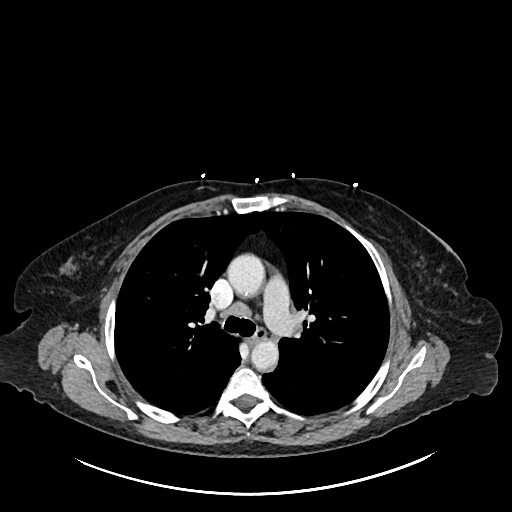
[im 101/146  lung]
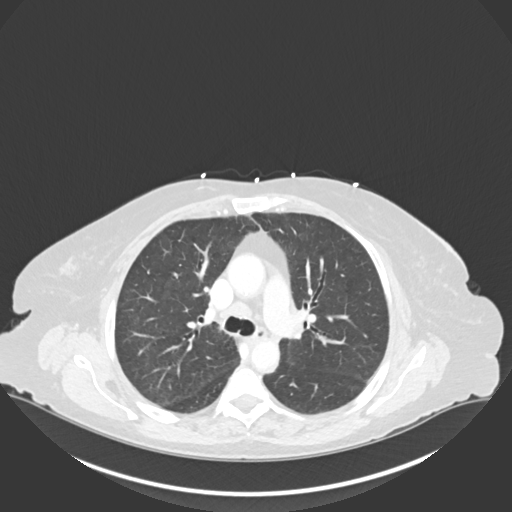
[im 112/146  lung]
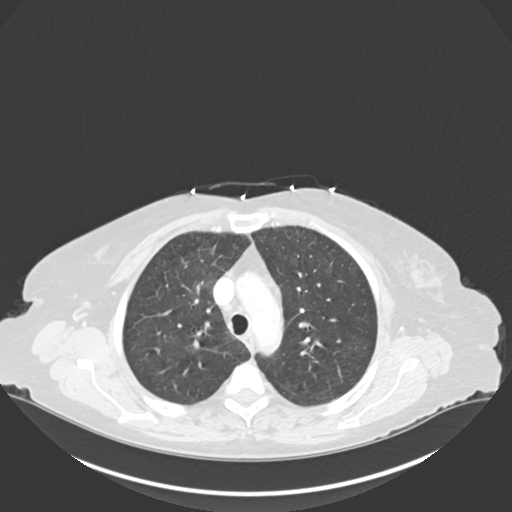
[im 123/146  lung]
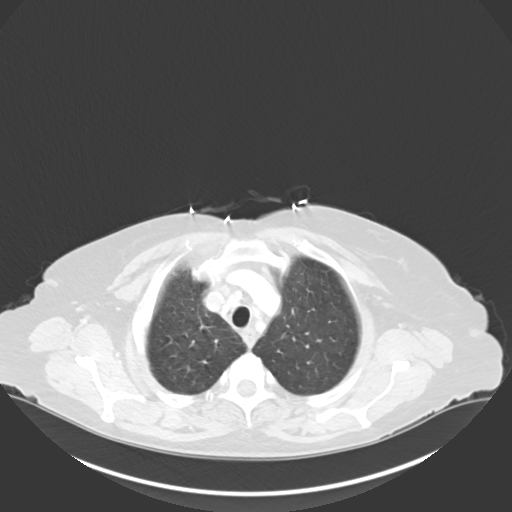
[im 134/146  lung]
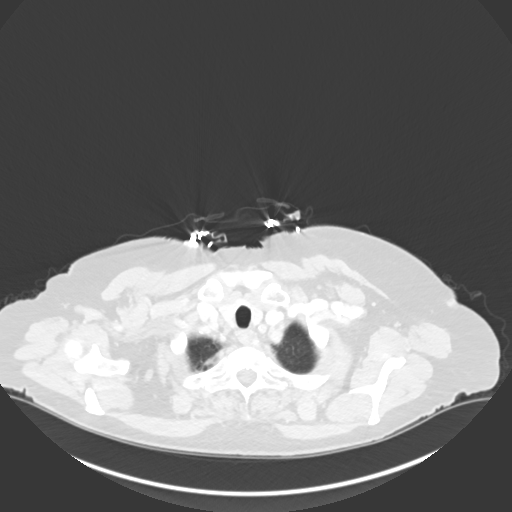

[Series 5: cor · coronal · 0.63mm/px · 3 of 121 slices shown]
[im 25/121  lung]
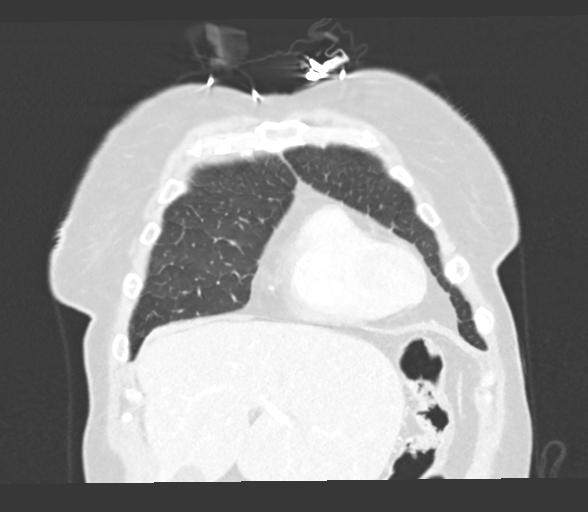
[im 49/121  lung]
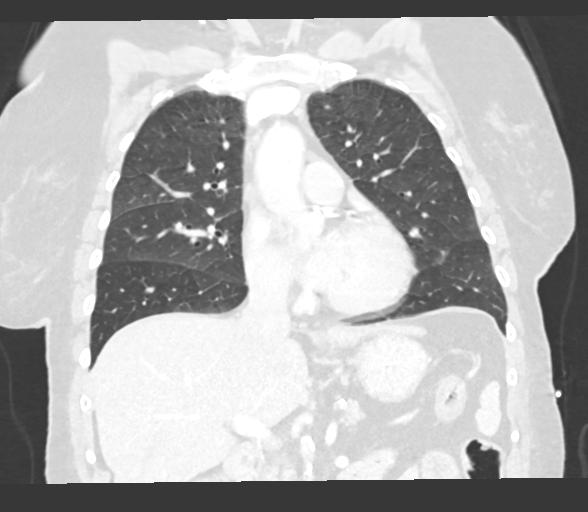
[im 73/121  lung]
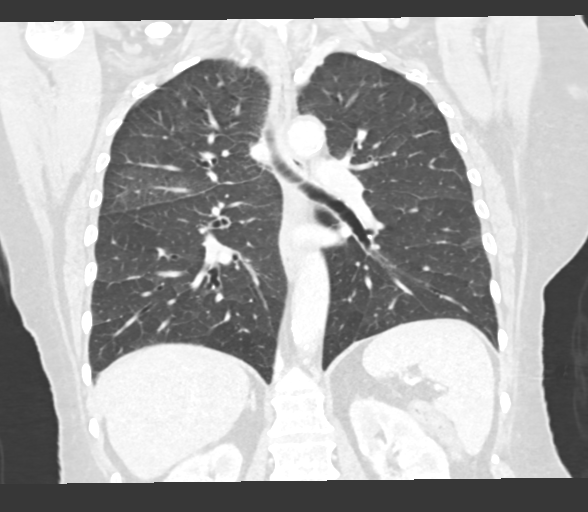

[15 of 36 positions shown; findings below may reference images not displayed]

RADIATION DOSE REDUCTION: This exam was performed according to the
departmental dose-optimization program which includes automated
exposure control, adjustment of the mA and/or kV according to
patient size and/or use of iterative reconstruction technique.

CONTRAST:  75mL OMNIPAQUE IOHEXOL 300 MG/ML  SOLN
FINDINGS: Cardiovascular: Diffuse coronary artery and scattered moderate
aortic calcifications. Heart is normal size. Aorta is normal
caliber.

Mediastinum/Nodes: No mediastinal, hilar, or axillary adenopathy.
Trachea and esophagus are unremarkable. Thyroid unremarkable.

Lungs/Pleura: No confluent airspace opacities. In particular, no
right lower lobe opacity as suggested on x-ray. No pleural
effusions.

Upper Abdomen: Imaging into the upper abdomen demonstrates no acute
findings.

Musculoskeletal: Chest wall soft tissues are unremarkable. No acute
bony abnormality.
IMPRESSION: No acute cardiopulmonary disease.  No right lower consolidation.

Diffuse coronary artery disease.

Aortic Atherosclerosis ([id number].J).

## 2022-10-05 MED ORDER — OXYCODONE-ACETAMINOPHEN 5-325 MG PO TABS: 1.0000 | ORAL_TABLET | ORAL | 0 refills | Status: DC | PRN

## 2022-10-05 NOTE — Discharge Summary (Addendum)
[facility]    Discharge Summary    Patient ID:  Autumn Arnold MRN: [id number] DOB/AGE: [birth year] 71 y.o.  Admit date: 10/04/2022 Discharge date: 10/05/2022 Date of Surgery: 10/04/2022 Surgeon: Surgeon(s): Lucky Cowboy Erskine Squibb, MD  Admission Diagnosis: Carotid stenosis, symptomatic, with infarction [facility]) [I63.239]  Discharge Diagnoses:  Carotid stenosis, symptomatic, with infarction [facility]) [I63.239]  Secondary Diagnoses: Past Medical History:  Diagnosis Date   Anxiety    Aortic atherosclerosis ([place])    Arthritis    Asthma    Ataxic gait    a.) requires gait aids for assitance   Atherosclerotic PVD with intermittent claudication ([place])    a.) s/p LEFT femoral endartarectomy and LEFT iliac stent 04/11/2022   Bilateral carotid artery disease ([place]) 11/07/2015   a.) doppler 11/07/2015: <50 BICA; b.) doppler 06/23/2021: near occ RICA< < 25% LICA; c.) doppler 95/63/8756: occluded RICA, mod plaque LICA   Bilateral lower extremity edema    CAD (coronary artery disease)    Chronic cough    COPD (chronic obstructive pulmonary disease) (HCC)    CVA (cerebral vascular accident) ([place]) 11/07/2015   a.) posterior superior LEFT lenticular nucleus to posterior LEFT corona radiata   DDD (degenerative disc disease), cervical    a.) s/p C5-C6 fusion   DOE (dyspnea on exertion)    GERD (gastroesophageal reflux disease)    History of bilateral cataract extraction 2017   History of left cataract extraction    Hyperlipidemia    Hypertension    Ischemic cerebrovascular accident (CVA) ([place]) 06/23/2021   a.) superior LEFT frontal and parietal ischemia   Ischemic cerebrovascular accident (CVA) ([place]) 04/04/2022   a.) subcentimeter cortical/subcortical aspect of RIGHT frontoparietal region --> acute to early subacte ischemic infarct   Long term current use of antithrombotics/antiplatelets    a.) DAPT (ASA + clopidogrel)   Lower extremity weakness    Mesenteric artery stenosis  ([place])    a.) s/p PTA and stenting 06/23/2022 --> SMA, mid-infrarenal, BILATERAL CIA; b.) s/p PTA and stenting 09/25/2022: SMA, BILATERAL CIA, infrarenal aorta, LEFT CFA   OSA on CPAP    PAF (paroxysmal atrial fibrillation) (HCC)    Right-sided lacunar infarction ([place]) 02/09/2010   a.) non-hemorrhagic   Small fiber neuropathy    T2DM (type 2 diabetes mellitus) ([place])    Vitamin D deficiency    Wheezing     Procedure(s): ENDARTERECTOMY CAROTID INSERTION OF RETROGRADE CAROTID STENT  Discharged Condition: good  HPI:  Postop day 1.  Right carotid endarterectomy with stent placement.  Autumn Arnold is a 71 y.o. female.  Patient returns for surgery 10/04/2022. She also has a known right common carotid artery occlusion as well as a high-grade stenosis at the subclavian artery.  We have been waiting for wounds to heal before dressing her carotid issues.  Her wound is mostly healed today. She has had no further neurological events however the patient does have very poor memory and her sister helps.     Hospital Course:  Autumn Arnold is a 71 y.o. female is S/P Right Extubated: POD #  N/A Post operative spent one night in ICU for recovery. All vitals remain stable. No note TIS or stroke to note. Right neck incision healing well. No complications to note. Incision skin closed with dermabond.  Physical Exam: Patient healing as expected.  No hematoma, seroma, infection noted. Alert notes x3, no acute distress Face: Symmetrical.  Tongue is midline. Neck: Trachea is midline.  No swelling or bruising. Cardiovascular:  Regular rate and rhythm Pulmonary: Clear to auscultation bilaterally Abdomen: Soft, nontender, nondistended Right groin access: Clean dry and intact.  No swelling or drainage noted Left lower extremity: Thigh soft.  Calf soft.  Extremities warm distally toes.  Hard to palpate pedal pulses however the foot is warm is her good capillary refill. Right lower extremity: Thigh soft.  Calf  soft.  Extremities warm distally toes.  Hard to palpate pedal pulses however the foot is warm is her good capillary refill. Neurological: No deficits noted   Post-op wounds:  clean, dry, intact or healing well  Pt. Ambulating, voiding and taking PO diet without difficulty. Pt pain controlled with PO pain meds.  Labs:  As below  Complications: none  Consults:    Significant Diagnostic Studies: CBC Lab Results  Component Value Date   WBC 10.8 (H) 10/05/2022   HGB 9.1 (L) 10/05/2022   HCT 27.3 (L) 10/05/2022   MCV 87.5 10/05/2022   PLT 170 10/05/2022    BMET    Component Value Date/Time   NA 135 10/05/2022 0456   K 4.3 10/05/2022 0456   CL 109 10/05/2022 0456   CO2 19 (L) 10/05/2022 0456   GLUCOSE 208 (H) 10/05/2022 0456   BUN 12 10/05/2022 0456   CREATININE 0.87 10/05/2022 0456   CALCIUM 7.8 (L) 10/05/2022 0456   GFRNONAA >60 10/05/2022 0456   GFRAA >60 11/08/2015 0419   COAG Lab Results  Component Value Date   INR 1.0 06/22/2021   INR 0.89 11/07/2015     Disposition:  Discharge to :Home  Allergies as of 10/05/2022       Reactions   Lisinopril Other (See Comments)   Tongue burning sensation        Medication List     TAKE these medications    albuterol 108 (90 Base) MCG/ACT inhaler Commonly known as: VENTOLIN HFA Inhale 2 puffs into the lungs as needed.   alendronate 70 MG tablet Commonly known as: FOSAMAX Take 70 mg by mouth once a week. Take with a full glass of water on an empty stomach.   amitriptyline 150 MG tablet Commonly known as: ELAVIL Take 150 mg by mouth daily. Reported on 12/20/2015   aspirin EC 81 MG tablet Take 1 tablet (81 mg total) by mouth daily.   atorvastatin 80 MG tablet Commonly known as: LIPITOR Take 80 mg by mouth daily.   busPIRone 5 MG tablet Commonly known as: BUSPAR Take 5 mg by mouth 2 (two) times daily.   Calcium Carb-Cholecalciferol 600-400 MG-UNIT Tabs Take 1 tablet by mouth daily.   clopidogrel  75 MG tablet Commonly known as: PLAVIX Take 1 tablet (75 mg total) by mouth daily.   cyanocobalamin 1000 MCG tablet Commonly known as: VITAMIN B12 Take 1,000 mcg by mouth daily. Reported on 12/20/2015   gabapentin 600 MG tablet Commonly known as: NEURONTIN Take 1,200 mg by mouth 2 (two) times daily.   metFORMIN 1000 MG tablet Commonly known as: GLUCOPHAGE Take 1,000 mg by mouth 2 (two) times daily.   metoprolol tartrate 25 MG tablet Commonly known as: LOPRESSOR Take 0.5 tablets (12.5 mg total) by mouth 2 (two) times daily.   montelukast 10 MG tablet Commonly known as: SINGULAIR Take 10 mg by mouth daily.   multivitamin with minerals Tabs tablet Take 1 tablet by mouth daily.   oxyCODONE-acetaminophen 5-325 MG tablet Commonly known as: PERCOCET/ROXICET Take 1 tablet by mouth every 4 (four) hours as needed for moderate pain.   pantoprazole 40  MG tablet Commonly known as: PROTONIX Take 40 mg by mouth daily.   senna-docusate 8.6-50 MG tablet Commonly known as: Senokot-S Take 2 tablets by mouth at bedtime as needed for mild constipation.   sertraline 50 MG tablet Commonly known as: Zoloft Take 3 tablets (150 mg total) by mouth daily.   tiZANidine 4 MG tablet Commonly known as: ZANAFLEX Take 4 mg by mouth 2 (two) times daily.       Verbal and written Discharge instructions given to the patient. Wound care per Discharge AVS  Follow-up Information     Dew, Erskine Squibb, MD Follow up.   Specialties: Vascular Surgery, Radiology, Interventional Cardiology Why: Follow up 3-4 weeks JD/APP with Carotid Contact information: [place] [address] [phone]                 Signed: Drema Pry, NP  10/05/2022, 8:48 AM

## 2022-10-05 NOTE — Progress Notes (Signed)
Pt discharged to home with spouse.  Spouse and sister provide needed assistance with appointments and transportation.  Pt is mostly oriented and can care for self in home but needs direction and supervision.  RN discussed discharge orders, including medication prescriptions, reasons to contact the MD, next scheduled appointments, and diet and activity orders.  Pt and her spouse verbalized understanding and denied having questions.  RN transported pt to spouse's vehicle outside the [employer] entrance.

## 2022-10-05 NOTE — Progress Notes (Signed)
Patient coughing repeatedly, bp came down quickly after episode was over.

## 2022-10-05 NOTE — Anesthesia Postprocedure Evaluation (Signed)
Anesthesia Post Note  Patient: Autumn Arnold  Procedure(s) Performed: ENDARTERECTOMY CAROTID (Right) INSERTION OF RETROGRADE CAROTID STENT (Right)  Patient location during evaluation: ICU Anesthesia Type: General Level of consciousness: awake and alert and oriented Pain management: pain level controlled Vital Signs Assessment: post-procedure vital signs reviewed and stable Respiratory status: spontaneous breathing and patient connected to nasal cannula oxygen Cardiovascular status: stable Postop Assessment: no apparent nausea or vomiting Anesthetic complications: no  No notable events documented.   Last Vitals:  Vitals:   10/05/22 0700 10/05/22 0704  BP: (!) 81/47 (!) 118/39  Pulse: 73 80  Resp: 16 13  Temp:    SpO2: 97% 97%    Last Pain:  Vitals:   10/05/22 0505  TempSrc:   PainSc: Asleep                 Norm Salt

## 2022-10-05 NOTE — TOC Initial Note (Signed)
Transition of Care [facility]) - Initial/Assessment Note    Patient Details  Name: Autumn Arnold MRN: [id number] Date of Birth: 1951/03/30  Transition of Care [facility]) CM/SW Contact:    Shelbie Hutching, RN Phone Number: 10/05/2022, 10:51 AM  Clinical Narrative:                  Transition of Care [facility]) Screening Note   Patient Details  Name: Autumn Arnold Date of Birth: [birth year]   Transition of Care [facility]) CM/SW Contact:    Shelbie Hutching, RN Phone Number: 10/05/2022, 10:51 AM    Transition of Care Department [facility]) has reviewed patient and no TOC needs have been identified at this time. We will continue to monitor patient advancement through interdisciplinary progression rounds. If new patient transition needs arise, please place a TOC consult.         Patient Goals and CMS Choice        Expected Discharge Plan and Services           Expected Discharge Date: 10/05/22                                    Prior Living Arrangements/Services                       Activities of Daily Living      Permission Sought/Granted                  Emotional Assessment              Admission diagnosis:  Carotid stenosis, symptomatic, with infarction [facility]) [I63.239] Patient Active Problem List   Diagnosis Date Noted   Carotid stenosis, symptomatic, with infarction ([place]) 10/04/2022   AKI (acute kidney injury) ([place]) 04/20/2022   Leukocytosis 04/20/2022   Low back pain 04/20/2022   PVD (peripheral vascular disease) ([place]) 04/20/2022   Weakness 04/19/2022   Carotid artery occlusion with infarction ([place]) 04/06/2022   Hypomagnesemia 04/05/2022   Iron deficiency anemia 04/04/2022   Acute CVA (cerebrovascular accident) ([place]) 04/04/2022   COPD (chronic obstructive pulmonary disease) ([place])    Hypotension    Gastroesophageal reflux disease without esophagitis 08/10/2021   Carotid stenosis 07/29/2021   Atherosclerosis of native arteries of extremity with  intermittent claudication ([place]) 07/17/2021   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[place]) 07/17/2021   Hyperlipidemia 07/17/2021   Atherosclerosis of arteries 07/07/2021   Acute blood loss anemia    Retroperitoneal bleed    Drop in hemoglobin    Visual disturbance    Sepsis ([place]) 06/23/2021   SIRS (systemic inflammatory response syndrome) (HCC)    Rectal bleeding    Tobacco abuse    Anxiety and depression 06/22/2021   Hemiparesis affecting right side as late effect of stroke ([place]) 06/22/2021   OSA (obstructive sleep apnea) 06/22/2021   Type 2 diabetes mellitus with hyperlipidemia ([place]) 06/22/2021   Syncope 06/22/2021   Critical limb ischemia of left lower extremity ([place]) 06/22/2021   Hyperglycemia due to type 2 diabetes mellitus ([place]) 06/22/2021   Lactic acidosis 06/22/2021   Vitamin D deficiency 07/01/2018   Osteopenia of left hip 06/27/2018   DNR (do not resuscitate) 06/23/2018   Encounter for general adult medical examination without abnormal findings 06/23/2018   Other chronic pain 06/23/2018   Tobacco dependence 10/18/2017   PCP:  [name], MD Pharmacy:   [id number] -  [place], [address] [place] [address] Phone: [phone] Fax: [phone]  [id number] [address] [place] [place]: [phone]: [phone]    Social Determinants of Health (SDOH) Interventions    Readmission Risk Interventions    04/13/2022   10:44 AM 06/23/2021    2:20 PM  Readmission Risk Prevention Plan  Post Dischage Appt  Complete  Medication Screening  Complete  Transportation Screening Complete Complete  PCP or Specialist Appt within 3-5 Days Complete   [place] or [place]   Social Work Consult for [place]/Counseling Complete   Palliative Care Screening Not Applicable   Medication Review (Press photographer Complete

## 2022-10-06 LAB — HEMOGLOBIN A1C
Hgb A1c MFr Bld: 7.1 % — ABNORMAL HIGH (ref 4.8–5.6)
Mean Plasma Glucose: 157 mg/dL

## 2022-10-06 IMAGING — US US CAROTID DUPLEX BILAT
2 series · 13 of 24 positions shown · non-contrast
Comparison: None Available.

CLINICAL DATA: Cerebrovascular accident

EXAM:
BILATERAL CAROTID DUPLEX ULTRASOUND
TECHNIQUE: Gray scale imaging, color Doppler and duplex ultrasound were
performed of bilateral carotid and vertebral arteries in the neck.

[Series 1: us carotid bilateral · 6 of 64 slices shown (1 of 2)]
[im 1/64]
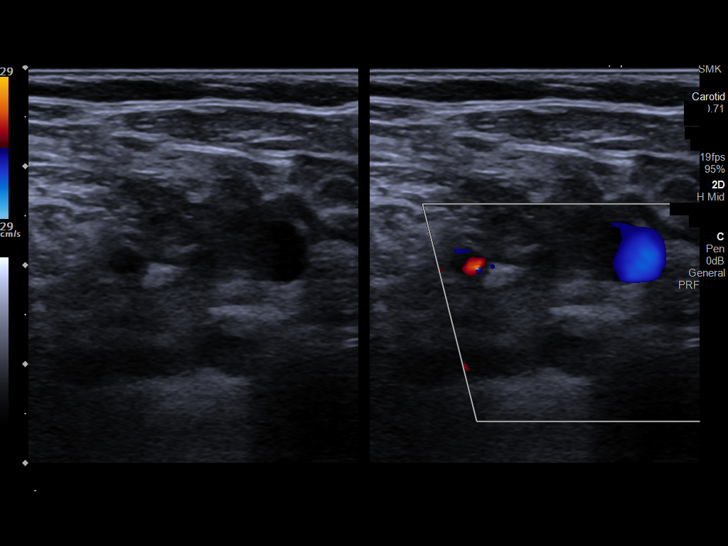
[im 12/64]
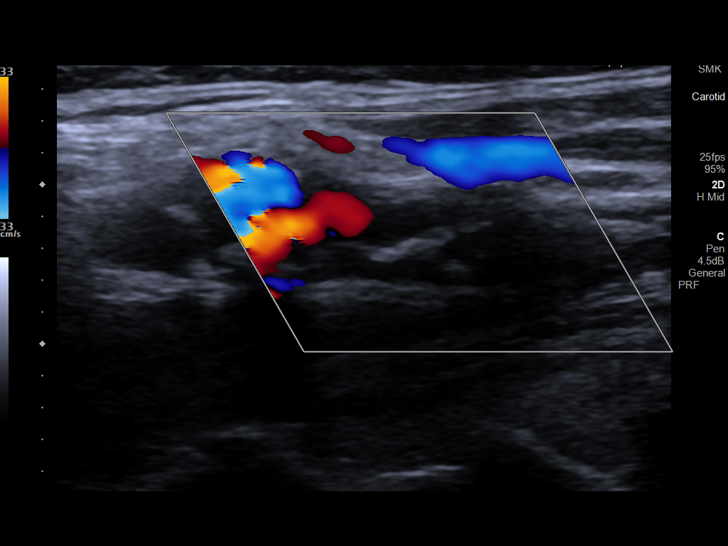
[im 23/64]
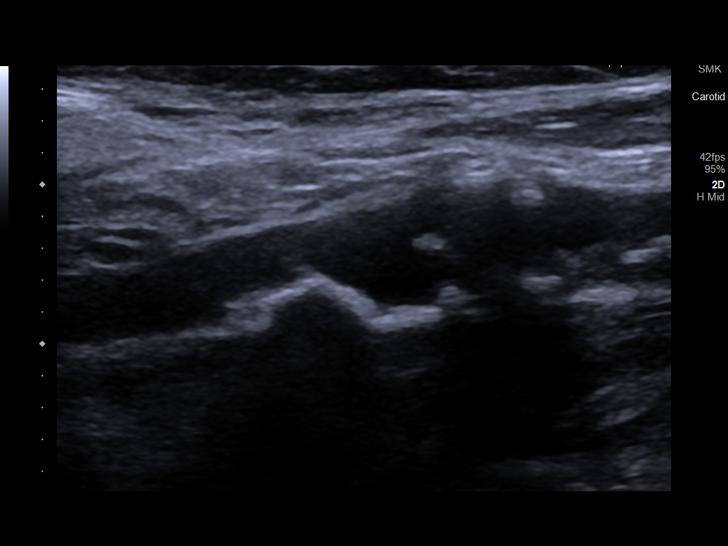
[im 35/64]
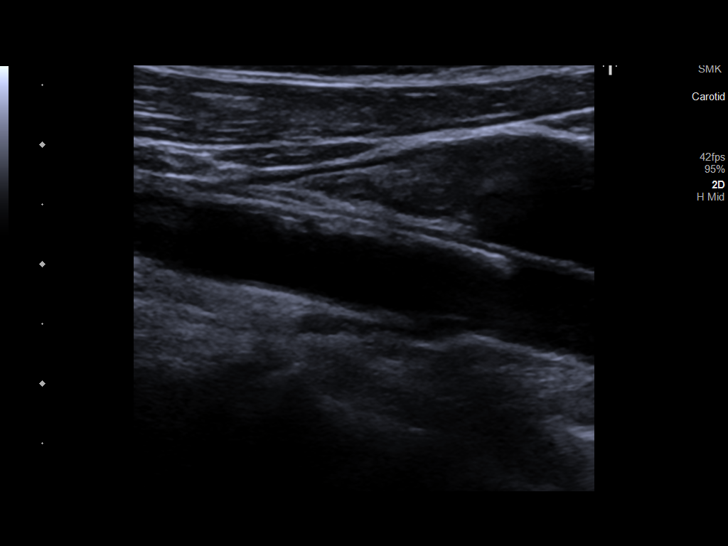
[im 46/64]
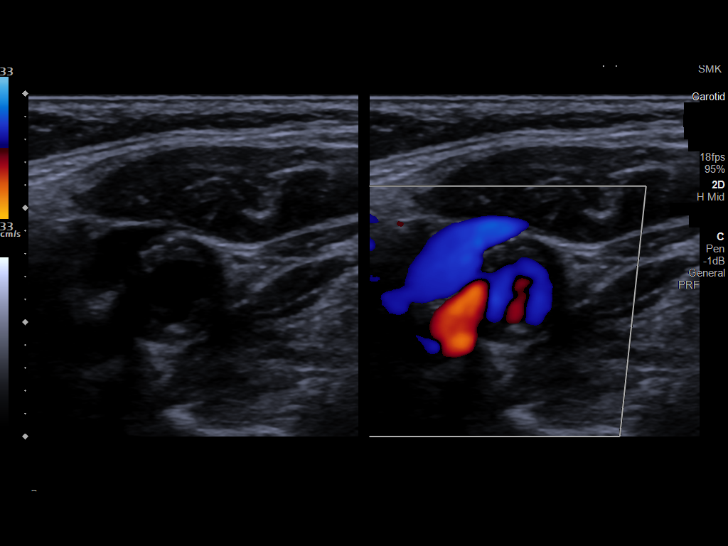
[im 58/64]
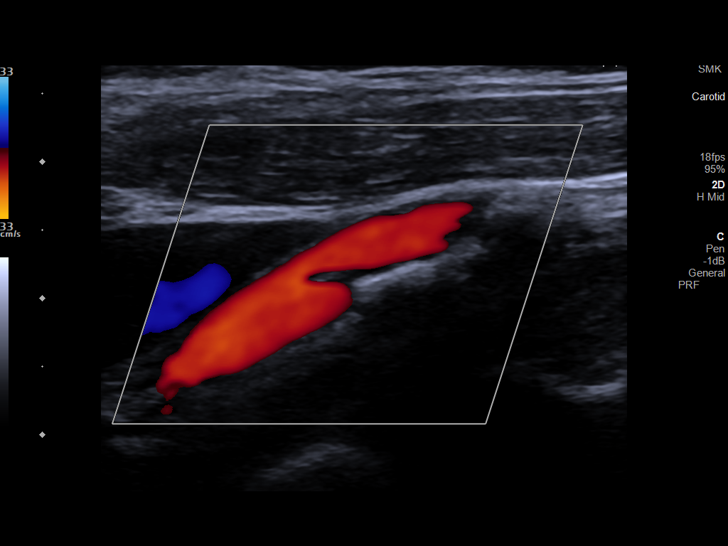

[Series 1: us carotid bilateral · 7 of 64 slices shown (2 of 2)]
[im 1/64]
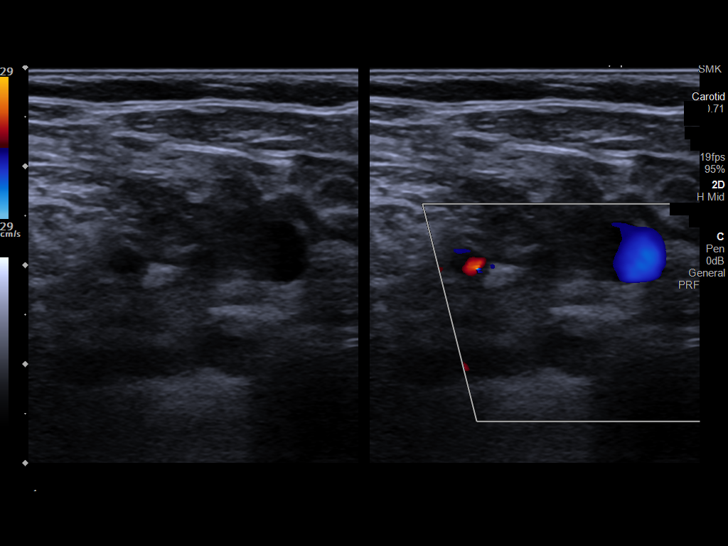
[im 6/64]
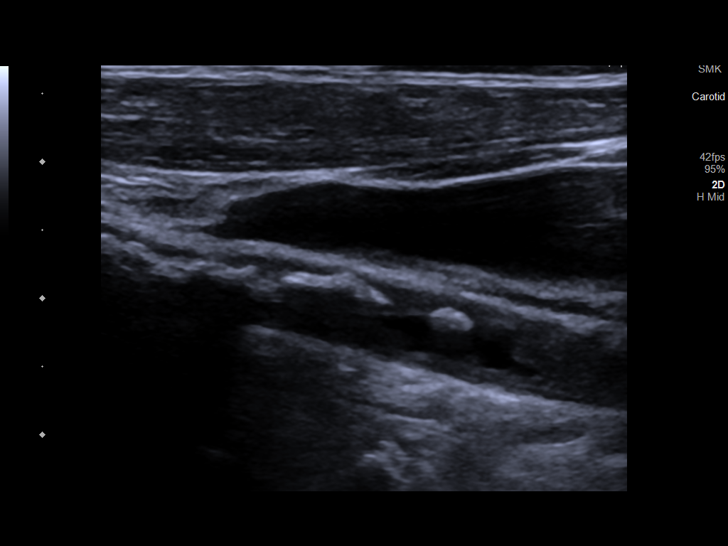
[im 18/64]
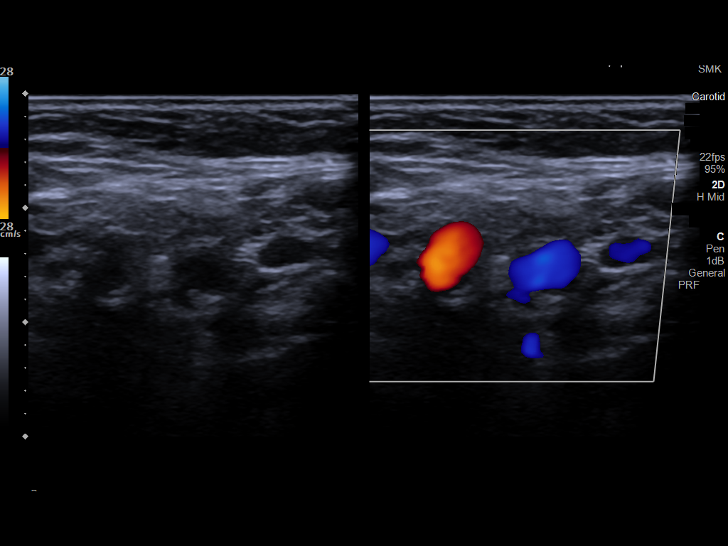
[im 29/64]
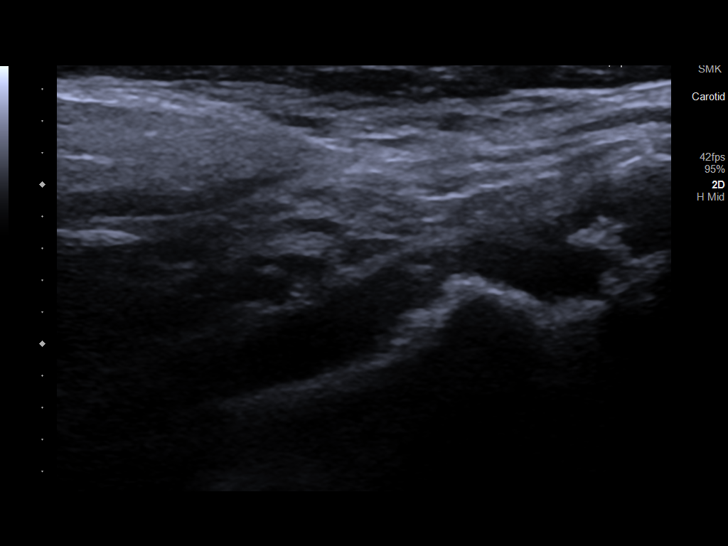
[im 41/64]
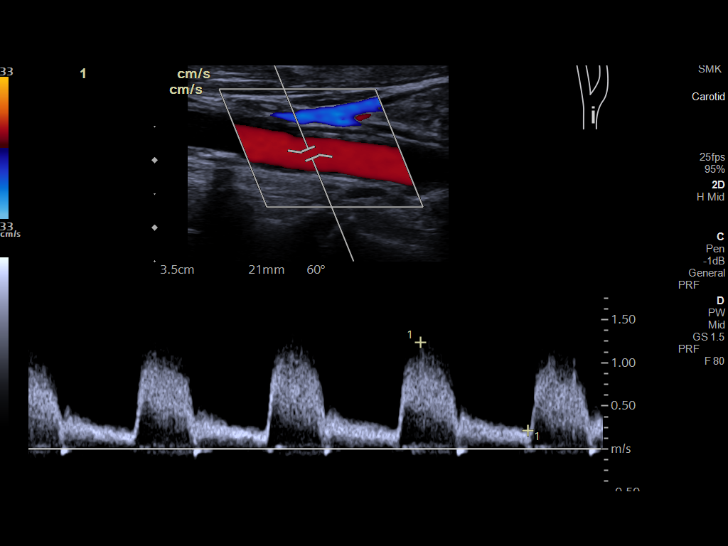
[im 52/64]
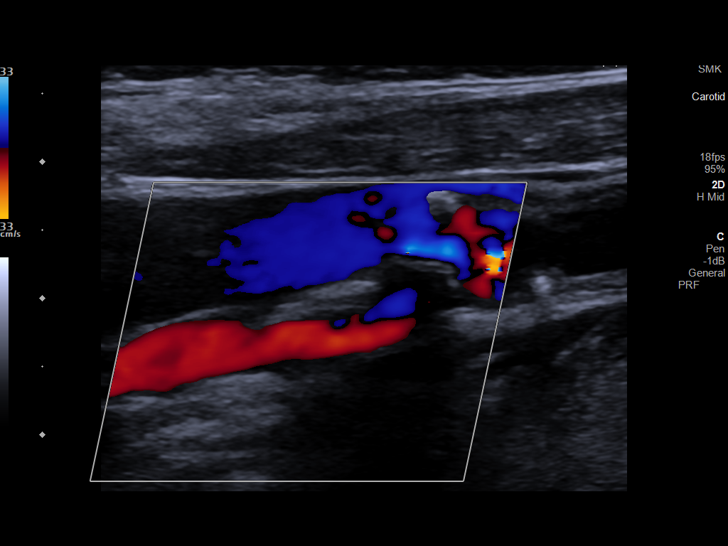
[im 64/64]
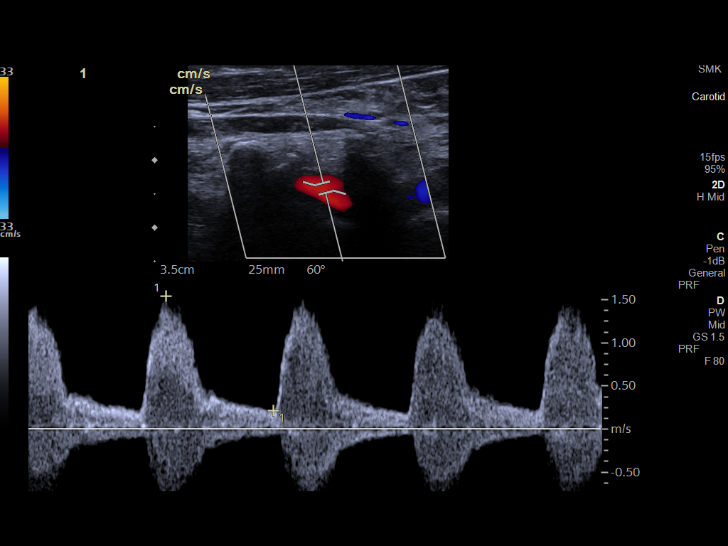

[13 of 24 positions shown; findings below may reference images not displayed]

FINDINGS: Criteria: Quantification of carotid stenosis is based on velocity
parameters that correlate the residual internal carotid diameter
with NASCET-based stenosis levels, using the diameter of the distal
internal carotid lumen as the denominator for stenosis measurement.

The following velocity measurements were obtained:

RIGHT
ICA: 139/43 cm/sec
CCA: 67/8 cm/sec

SYSTOLIC ICA/CCA RATIO:

ECA:  114 cm/sec

LEFT

ICA: 208/31 cm/sec

CCA: 139/22 cm/sec

SYSTOLIC ICA/CCA RATIO:

ECA:  190 cm/sec

RIGHT CAROTID ARTERY: No flow visualized within the right proximal
and mid common carotid artery. Flow is visualized distally in the
common carotid artery just proximal to the bifurcation. The external
carotid artery flow is reversed. Abnormal arterial waveform in the
internal carotid artery. Modest heterogeneous atherosclerotic plaque
in the proximal internal carotid artery. Evaluation of peak systolic
velocity criteria is limited in the setting of proximal common
carotid occlusion.

RIGHT VERTEBRAL ARTERY: Retrograde flow in the right vertebral
artery.

LEFT CAROTID ARTERY: Heterogeneous and irregular/ulcerated
atherosclerotic plaque in the left internal carotid artery. Peak
systolic velocity is likely spuriously elevated secondary to
contralateral occlusion.

LEFT VERTEBRAL ARTERY:  Patent with normal antegrade flow.
IMPRESSION: 1. Right common carotid artery occlusion with distal reconstitution
via retrograde flow through the external carotid artery.
2. Retrograde right vertebral artery flow. This may be due to
concurrent stenosis or occlusion in the right brachiocephalic
artery.
3. Moderate heterogeneous and irregular/ulcerated atherosclerotic
plaque in the proximal left internal carotid artery. Elevation of
the peak systolic velocity is likely greater than expected for the
degree of stenosis secondary to the contralateral common carotid
artery occlusion. Please refer to recent CT arteriogram of the neck
for degree of stenosis.
4. Left vertebral artery flow is antegrade.

## 2022-10-08 ENCOUNTER — Encounter: Payer: Self-pay | Admitting: Vascular Surgery

## 2022-10-08 LAB — SURGICAL PATHOLOGY

## 2022-11-01 ENCOUNTER — Ambulatory Visit (INDEPENDENT_AMBULATORY_CARE_PROVIDER_SITE_OTHER): Payer: Medicare HMO | Admitting: Nurse Practitioner

## 2022-11-01 ENCOUNTER — Encounter (INDEPENDENT_AMBULATORY_CARE_PROVIDER_SITE_OTHER): Payer: Medicare HMO

## 2022-12-26 ENCOUNTER — Encounter: Payer: Self-pay | Admitting: Vascular Surgery

## 2023-01-08 ENCOUNTER — Emergency Department: Payer: Medicare PPO

## 2023-01-08 ENCOUNTER — Observation Stay: Payer: Medicare PPO

## 2023-01-08 ENCOUNTER — Observation Stay
Admission: EM | Admit: 2023-01-08 | Discharge: 2023-01-09 | Disposition: A | Discharge status: Home Health | Payer: Medicare PPO | Attending: Internal Medicine | Admitting: Internal Medicine

## 2023-01-08 DIAGNOSIS — R748 Abnormal levels of other serum enzymes: Secondary | ICD-10-CM | POA: Diagnosis not present

## 2023-01-08 DIAGNOSIS — E785 Hyperlipidemia, unspecified: Secondary | ICD-10-CM | POA: Diagnosis not present

## 2023-01-08 DIAGNOSIS — Z7982 Long term (current) use of aspirin: Secondary | ICD-10-CM | POA: Diagnosis not present

## 2023-01-08 DIAGNOSIS — R296 Repeated falls: Secondary | ICD-10-CM | POA: Diagnosis not present

## 2023-01-08 DIAGNOSIS — E11649 Type 2 diabetes mellitus with hypoglycemia without coma: Secondary | ICD-10-CM | POA: Insufficient documentation

## 2023-01-08 DIAGNOSIS — I10 Essential (primary) hypertension: Secondary | ICD-10-CM | POA: Diagnosis not present

## 2023-01-08 DIAGNOSIS — F0390 Unspecified dementia without behavioral disturbance: Secondary | ICD-10-CM | POA: Diagnosis not present

## 2023-01-08 DIAGNOSIS — I480 Paroxysmal atrial fibrillation: Secondary | ICD-10-CM | POA: Insufficient documentation

## 2023-01-08 DIAGNOSIS — E1140 Type 2 diabetes mellitus with diabetic neuropathy, unspecified: Secondary | ICD-10-CM | POA: Insufficient documentation

## 2023-01-08 DIAGNOSIS — G459 Transient cerebral ischemic attack, unspecified: Secondary | ICD-10-CM | POA: Diagnosis not present

## 2023-01-08 DIAGNOSIS — R4701 Aphasia: Secondary | ICD-10-CM | POA: Diagnosis present

## 2023-01-08 DIAGNOSIS — J449 Chronic obstructive pulmonary disease, unspecified: Secondary | ICD-10-CM | POA: Diagnosis present

## 2023-01-08 DIAGNOSIS — J45909 Unspecified asthma, uncomplicated: Secondary | ICD-10-CM | POA: Diagnosis not present

## 2023-01-08 DIAGNOSIS — F32A Depression, unspecified: Secondary | ICD-10-CM | POA: Diagnosis present

## 2023-01-08 DIAGNOSIS — F17210 Nicotine dependence, cigarettes, uncomplicated: Secondary | ICD-10-CM | POA: Insufficient documentation

## 2023-01-08 DIAGNOSIS — I739 Peripheral vascular disease, unspecified: Secondary | ICD-10-CM | POA: Diagnosis present

## 2023-01-08 DIAGNOSIS — Z66 Do not resuscitate: Secondary | ICD-10-CM | POA: Diagnosis not present

## 2023-01-08 DIAGNOSIS — T796XXA Traumatic ischemia of muscle, initial encounter: Secondary | ICD-10-CM | POA: Diagnosis not present

## 2023-01-08 DIAGNOSIS — Z79899 Other long term (current) drug therapy: Secondary | ICD-10-CM | POA: Diagnosis not present

## 2023-01-08 DIAGNOSIS — Z1152 Encounter for screening for COVID-19: Secondary | ICD-10-CM | POA: Diagnosis not present

## 2023-01-08 DIAGNOSIS — Z8673 Personal history of transient ischemic attack (TIA), and cerebral infarction without residual deficits: Secondary | ICD-10-CM | POA: Diagnosis not present

## 2023-01-08 DIAGNOSIS — E162 Hypoglycemia, unspecified: Secondary | ICD-10-CM | POA: Insufficient documentation

## 2023-01-08 DIAGNOSIS — R2990 Unspecified symptoms and signs involving the nervous system: Secondary | ICD-10-CM | POA: Diagnosis present

## 2023-01-08 DIAGNOSIS — Z7902 Long term (current) use of antithrombotics/antiplatelets: Secondary | ICD-10-CM | POA: Diagnosis not present

## 2023-01-08 DIAGNOSIS — R531 Weakness: Secondary | ICD-10-CM

## 2023-01-08 DIAGNOSIS — Z7984 Long term (current) use of oral hypoglycemic drugs: Secondary | ICD-10-CM | POA: Diagnosis not present

## 2023-01-08 DIAGNOSIS — F17200 Nicotine dependence, unspecified, uncomplicated: Secondary | ICD-10-CM | POA: Diagnosis present

## 2023-01-08 DIAGNOSIS — R4189 Other symptoms and signs involving cognitive functions and awareness: Secondary | ICD-10-CM | POA: Insufficient documentation

## 2023-01-08 DIAGNOSIS — E1169 Type 2 diabetes mellitus with other specified complication: Secondary | ICD-10-CM | POA: Diagnosis present

## 2023-01-08 DIAGNOSIS — F419 Anxiety disorder, unspecified: Secondary | ICD-10-CM | POA: Diagnosis present

## 2023-01-08 DIAGNOSIS — I2510 Atherosclerotic heart disease of native coronary artery without angina pectoris: Secondary | ICD-10-CM | POA: Insufficient documentation

## 2023-01-08 LAB — COMPREHENSIVE METABOLIC PANEL
ALT: 16 U/L (ref 0–44)
AST: 35 U/L (ref 15–41)
Albumin: 3.5 g/dL (ref 3.5–5.0)
Alkaline Phosphatase: 85 U/L (ref 38–126)
Anion gap: 10 (ref 5–15)
BUN: 8 mg/dL (ref 8–23)
CO2: 22 mmol/L (ref 22–32)
Calcium: 9.3 mg/dL (ref 8.9–10.3)
Chloride: 106 mmol/L (ref 98–111)
Creatinine, Ser: 0.86 mg/dL (ref 0.44–1.00)
GFR, Estimated: >60 mL/min (ref >60)
Glucose, Bld: 81 mg/dL (ref 70–99)
Potassium: 3.9 mmol/L (ref 3.5–5.1)
Sodium: 138 mmol/L (ref 135–145)
Total Bilirubin: 0.5 mg/dL (ref 0.3–1.2)
Total Protein: 7 g/dL (ref 6.5–8.1)

## 2023-01-08 LAB — URINALYSIS, W/ REFLEX TO CULTURE (INFECTION SUSPECTED)
Bacteria, UA: NONE SEEN
Bilirubin Urine: NEGATIVE
Glucose, UA: NEGATIVE mg/dL
Hgb urine dipstick: NEGATIVE
Ketones, ur: NEGATIVE mg/dL
Leukocytes,Ua: NEGATIVE
Nitrite: NEGATIVE
Protein, ur: NEGATIVE mg/dL
Specific Gravity, Urine: 1.011 (ref 1.005–1.030)
pH: 5 (ref 5.0–8.0)

## 2023-01-08 LAB — CBC
HCT: 34.7 % — ABNORMAL LOW (ref 36.0–46.0)
Hemoglobin: 11.3 g/dL — ABNORMAL LOW (ref 12.0–15.0)
MCH: 28.3 pg (ref 26.0–34.0)
MCHC: 32.6 g/dL (ref 30.0–36.0)
MCV: 86.8 fL (ref 80.0–100.0)
Platelets: 202 10*3/uL (ref 150–400)
RBC: 4 MIL/uL (ref 3.87–5.11)
RDW: 15 % (ref 11.5–15.5)
WBC: 10.1 10*3/uL (ref 4.0–10.5)
nRBC: 0 % (ref 0.0–0.2)

## 2023-01-08 LAB — CK: Total CK: 1194 U/L — ABNORMAL HIGH (ref 38–234)

## 2023-01-08 LAB — DIFFERENTIAL
Abs Immature Granulocytes: 0.03 10*3/uL (ref 0.00–0.07)
Basophils Absolute: 0 10*3/uL (ref 0.0–0.1)
Basophils Relative: 0 %
Eosinophils Absolute: 0.1 10*3/uL (ref 0.0–0.5)
Eosinophils Relative: 1 %
Immature Granulocytes: 0 %
Lymphocytes Relative: 16 %
Lymphs Abs: 1.7 10*3/uL (ref 0.7–4.0)
Monocytes Absolute: 0.5 10*3/uL (ref 0.1–1.0)
Monocytes Relative: 5 %
Neutro Abs: 7.7 10*3/uL (ref 1.7–7.7)
Neutrophils Relative %: 78 %

## 2023-01-08 LAB — GLUCOSE, CAPILLARY
Glucose-Capillary: 38 mg/dL — CL (ref 70–99)
Glucose-Capillary: 44 mg/dL — CL (ref 70–99)
Glucose-Capillary: 130 mg/dL — ABNORMAL HIGH (ref 70–99)
Glucose-Capillary: 88 mg/dL (ref 70–99)

## 2023-01-08 LAB — LACTIC ACID, PLASMA
Lactic Acid, Venous: 1.5 mmol/L (ref 0.5–1.9)
Lactic Acid, Venous: 1.2 mmol/L (ref 0.5–1.9)

## 2023-01-08 LAB — ETHANOL: Alcohol, Ethyl (B): <10 mg/dL (ref <10)

## 2023-01-08 LAB — APTT: aPTT: 28 seconds (ref 24–36)

## 2023-01-08 LAB — TSH: TSH: 0.958 u[IU]/mL (ref 0.350–4.500)

## 2023-01-08 LAB — CBG MONITORING, ED: Glucose-Capillary: 80 mg/dL (ref 70–99)

## 2023-01-08 LAB — PROTIME-INR
INR: 1 (ref 0.8–1.2)
Prothrombin Time: 12.7 seconds (ref 11.4–15.2)

## 2023-01-08 LAB — RESP PANEL BY RT-PCR (RSV, FLU A&B, COVID)  RVPGX2
Influenza A by PCR: NEGATIVE
Influenza B by PCR: NEGATIVE
Resp Syncytial Virus by PCR: NEGATIVE
SARS Coronavirus 2 by RT PCR: NEGATIVE

## 2023-01-08 MED ORDER — INSULIN ASPART 100 UNIT/ML IJ SOLN: 0.0000 [IU] | Freq: Every day | INTRAMUSCULAR | Status: DC

## 2023-01-08 MED ORDER — CLOPIDOGREL BISULFATE 75 MG PO TABS
75.0000 mg | ORAL_TABLET | Freq: Every day | ORAL | Status: DC
  Administered 2023-01-09: 75 mg via ORAL
  Filled 2023-01-08: qty 1

## 2023-01-08 MED ORDER — IOHEXOL 350 MG/ML SOLN
75.0000 mL | Freq: Once | INTRAVENOUS | Status: AC | PRN
  Administered 2023-01-08: 75 mL via INTRAVENOUS

## 2023-01-08 MED ORDER — TIZANIDINE HCL 4 MG PO TABS: 4.0000 mg | ORAL_TABLET | Freq: Two times a day (BID) | ORAL | Status: DC | PRN

## 2023-01-08 MED ORDER — ATORVASTATIN CALCIUM 20 MG PO TABS: 80.0000 mg | ORAL_TABLET | Freq: Every day | ORAL | Status: DC

## 2023-01-08 MED ORDER — DEXTROSE 50 % IV SOLN: 1.0000 | INTRAVENOUS | Status: DC | PRN

## 2023-01-08 MED ORDER — DEXTROSE 50 % IV SOLN
INTRAVENOUS | Status: AC
  Administered 2023-01-08: 50 mL via INTRAVENOUS
  Filled 2023-01-08: qty 50

## 2023-01-08 MED ORDER — ONDANSETRON HCL 4 MG/2ML IJ SOLN: 4.0000 mg | Freq: Four times a day (QID) | INTRAMUSCULAR | Status: DC | PRN

## 2023-01-08 MED ORDER — SODIUM CHLORIDE 0.9 % IV BOLUS
1000.0000 mL | Freq: Once | INTRAVENOUS | Status: AC
  Administered 2023-01-08: 1000 mL via INTRAVENOUS

## 2023-01-08 MED ORDER — HYDRALAZINE HCL 20 MG/ML IJ SOLN: 5.0000 mg | Freq: Three times a day (TID) | INTRAMUSCULAR | Status: DC | PRN

## 2023-01-08 MED ORDER — ALBUTEROL SULFATE (2.5 MG/3ML) 0.083% IN NEBU: 2.5000 mg | INHALATION_SOLUTION | Freq: Four times a day (QID) | RESPIRATORY_TRACT | Status: DC | PRN

## 2023-01-08 MED ORDER — ACETAMINOPHEN 650 MG RE SUPP: 650.0000 mg | Freq: Four times a day (QID) | RECTAL | Status: DC | PRN

## 2023-01-08 MED ORDER — ONDANSETRON HCL 4 MG PO TABS: 4.0000 mg | ORAL_TABLET | Freq: Four times a day (QID) | ORAL | Status: DC | PRN

## 2023-01-08 MED ORDER — NICOTINE 21 MG/24HR TD PT24: 21.0000 mg | MEDICATED_PATCH | Freq: Every day | TRANSDERMAL | Status: DC | PRN

## 2023-01-08 MED ORDER — ENOXAPARIN SODIUM 40 MG/0.4ML IJ SOSY: 40.0000 mg | PREFILLED_SYRINGE | INTRAMUSCULAR | Status: DC

## 2023-01-08 MED ORDER — PANTOPRAZOLE SODIUM 40 MG PO TBEC
40.0000 mg | DELAYED_RELEASE_TABLET | Freq: Every day | ORAL | Status: DC
  Administered 2023-01-09: 40 mg via ORAL
  Filled 2023-01-08: qty 1

## 2023-01-08 MED ORDER — LACTATED RINGERS IV SOLN: INTRAVENOUS | Status: DC

## 2023-01-08 MED ORDER — ALBUTEROL SULFATE HFA 108 (90 BASE) MCG/ACT IN AERS: 2.0000 | INHALATION_SPRAY | RESPIRATORY_TRACT | Status: DC | PRN

## 2023-01-08 MED ORDER — GABAPENTIN 600 MG PO TABS: 1200.0000 mg | ORAL_TABLET | Freq: Two times a day (BID) | ORAL | Status: DC

## 2023-01-08 MED ORDER — INSULIN ASPART 100 UNIT/ML IJ SOLN
0.0000 [IU] | Freq: Three times a day (TID) | INTRAMUSCULAR | Status: DC
  Administered 2023-01-09: 1 [IU] via SUBCUTANEOUS
  Filled 2023-01-08: qty 1

## 2023-01-08 MED ORDER — LORAZEPAM 0.5 MG PO TABS: 0.5000 mg | ORAL_TABLET | Freq: Four times a day (QID) | ORAL | Status: DC | PRN

## 2023-01-08 MED ORDER — SODIUM CHLORIDE 0.9% FLUSH
3.0000 mL | Freq: Once | INTRAVENOUS | Status: AC
  Administered 2023-01-08: 3 mL via INTRAVENOUS

## 2023-01-08 MED ORDER — ACETAMINOPHEN 325 MG PO TABS: 650.0000 mg | ORAL_TABLET | Freq: Four times a day (QID) | ORAL | Status: DC | PRN

## 2023-01-08 MED ORDER — SENNOSIDES-DOCUSATE SODIUM 8.6-50 MG PO TABS: 1.0000 | ORAL_TABLET | Freq: Every evening | ORAL | Status: DC | PRN

## 2023-01-08 MED ORDER — ADULT MULTIVITAMIN W/MINERALS CH
1.0000 | ORAL_TABLET | Freq: Every day | ORAL | Status: DC
  Administered 2023-01-09: 1 via ORAL
  Filled 2023-01-08: qty 1

## 2023-01-08 MED ORDER — MONTELUKAST SODIUM 10 MG PO TABS: 10.0000 mg | ORAL_TABLET | Freq: Every day | ORAL | Status: DC

## 2023-01-08 MED ORDER — GABAPENTIN 300 MG PO CAPS
400.0000 mg | ORAL_CAPSULE | Freq: Four times a day (QID) | ORAL | Status: DC
  Administered 2023-01-09 (×4): 400 mg via ORAL
  Filled 2023-01-08 (×4): qty 1

## 2023-01-08 MED ORDER — OXYCODONE-ACETAMINOPHEN 5-325 MG PO TABS: 1.0000 | ORAL_TABLET | ORAL | Status: DC | PRN

## 2023-01-08 NOTE — Assessment & Plan Note (Signed)
-   Atorvastatin 80 mg nightly resumed ?

## 2023-01-08 NOTE — Progress Notes (Signed)
CODE STROKE- PHARMACY COMMUNICATION   Time CODE STROKE called/page received:1753  Time response to CODE STROKE was made (in person or via phone): 1756  Time Stroke Kit retrieved from [place] (only if needed):N/A  Name of Provider/Nurse contacted:Tamesha Brathwaite, RN  Past Medical History:  Diagnosis Date   Anxiety    Aortic atherosclerosis ([place])    Arthritis    Asthma    Ataxic gait    a.) requires gait aids for assitance   Atherosclerotic PVD with intermittent claudication ([place])    a.) s/p LEFT femoral endartarectomy and LEFT iliac stent 04/11/2022   Bilateral carotid artery disease ([place]) 11/07/2015   a.) doppler 11/07/2015: <50 BICA; b.) doppler 06/23/2021: near occ RICA< < [id number] LICA; c.) doppler [id number]: occluded RICA, mod plaque LICA   Bilateral lower extremity edema    CAD (coronary artery disease)    Chronic cough    COPD (chronic obstructive pulmonary disease) ([place])    CVA (cerebral vascular accident) ([place]) 11/07/2015   a.) posterior superior LEFT lenticular nucleus to posterior LEFT corona radiata   DDD (degenerative disc disease), cervical    a.) s/p C5-C6 fusion   DOE (dyspnea on exertion)    GERD (gastroesophageal reflux disease)    History of bilateral cataract extraction 2017   History of left cataract extraction    Hyperlipidemia    Hypertension    Ischemic cerebrovascular accident (CVA) ([place]) 06/23/2021   a.) superior LEFT frontal and parietal ischemia   Ischemic cerebrovascular accident (CVA) ([place]) 04/04/2022   a.) subcentimeter cortical/subcortical aspect of RIGHT frontoparietal region --> acute to early subacte ischemic infarct   Long term current use of antithrombotics/antiplatelets    a.) DAPT (ASA + clopidogrel)   Lower extremity weakness    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HCC)    a.) s/p PTA and stenting 06/23/2022 --> SMA, mid-infrarenal, BILATERAL CIA; b.) s/p PTA and stenting 09/25/2022: SMA, BILATERAL CIA, infrarenal aorta, LEFT CFA   OSA on CPAP     PAF (paroxysmal atrial fibrillation) (HCC)    Right-sided lacunar infarction ([place]) 02/09/2010   a.) non-hemorrhagic   Small fiber neuropathy    T2DM (type 2 diabetes mellitus) ([place])    Vitamin D deficiency    Wheezing    Prior to Admission medications   Medication Sig Start Date End Date Taking? Authorizing Provider  albuterol (VENTOLIN HFA) 108 (90 Base) MCG/ACT inhaler Inhale 2 puffs into the lungs as needed.    [provider]  alendronate (FOSAMAX) 70 MG tablet Take 70 mg by mouth once a week. Take with a full glass of water on an empty stomach.    [provider]  amitriptyline (ELAVIL) 150 MG tablet Take 150 mg by mouth daily. Reported on 12/20/2015    [provider]  aspirin EC 81 MG EC tablet Take 1 tablet (81 mg total) by mouth daily. 11/08/15   Gladstone Lighter, MD  atorvastatin (LIPITOR) 80 MG tablet Take 80 mg by mouth daily. 06/13/21   [provider]  busPIRone (BUSPAR) 5 MG tablet Take 5 mg by mouth 2 (two) times daily. 04/18/21   [provider]  Calcium Carb-Cholecalciferol 600-400 MG-UNIT TABS Take 1 tablet by mouth daily.    [provider]  clopidogrel (PLAVIX) 75 MG tablet Take 1 tablet (75 mg total) by mouth daily. 09/24/22   Kris Hartmann, NP  gabapentin (NEURONTIN) 600 MG tablet Take 1,200 mg by mouth 2 (two) times daily. 04/18/21   [provider]  metFORMIN (GLUCOPHAGE) 1000  MG tablet Take 1,000 mg by mouth 2 (two) times daily. 06/13/21   [provider]  metoprolol tartrate (LOPRESSOR) 25 MG tablet Take 0.5 tablets (12.5 mg total) by mouth 2 (two) times daily. 04/13/22   Emeterio Reeve, DO  montelukast (SINGULAIR) 10 MG tablet Take 10 mg by mouth daily. 06/13/21   [provider]  Multiple Vitamin (MULTIVITAMIN WITH MINERALS) TABS tablet Take 1 tablet by mouth daily. 06/29/21   Fritzi Mandes, MD  oxyCODONE-acetaminophen (PERCOCET/ROXICET) 5-325 MG tablet Take 1 tablet by mouth every 4 (four)  hours as needed for moderate pain. 10/05/22   Pace, Cyndie Chime, NP  pantoprazole (PROTONIX) 40 MG tablet Take 40 mg by mouth daily. 01/30/21   [provider]  senna-docusate (SENOKOT-S) 8.6-50 MG tablet Take 2 tablets by mouth at bedtime as needed for mild constipation. Patient not taking: Reported on 09/24/2022 04/13/22   Emeterio Reeve, DO  sertraline (ZOLOFT) 50 MG tablet Take 3 tablets (150 mg total) by mouth daily. 06/28/21   Fritzi Mandes, MD  tiZANidine (ZANAFLEX) 4 MG tablet Take 4 mg by mouth 2 (two) times daily. 05/22/21   [provider]  vitamin B-12 (CYANOCOBALAMIN) 1000 MCG tablet Take 1,000 mcg by mouth daily. Reported on 12/20/2015    [provider]    Lorin Picket ,PharmD Clinical Pharmacist  01/08/2023  6:15 PM

## 2023-01-08 NOTE — ED Notes (Signed)
Lab at bedside

## 2023-01-08 NOTE — Progress Notes (Signed)
   01/08/23 1750  Spiritual Encounters  Type of Visit Initial  Care provided to: Pt not available  Referral source Code page  Reason for visit Code  OnCall Visit Yes   Chaplain responded to Code Stroke. Patient at [facility] Chaplain circled through ED waiting. No family present. Chaplain services available as needed.

## 2023-01-08 NOTE — ED Notes (Signed)
Lab called to collect blood. Unable to obtain ordered labs

## 2023-01-08 NOTE — Hospital Course (Addendum)
Autumn Arnold is a 72 year old female with history of hyperlipidemia, GERD, non-insulin-dependent diabetes mellitus, depression, anxiety, history of CVA, neuropathy, who presents emergency department for chief concerns of frequent falls, and speech slurring that started at approximately 1700 on day of admission. MRI of the brain did not show acute stroke.  CT angiogram neck and head showed right subclavian occlusion with a severe right vertebral artery occlusion.  Right LCA posttreatment still has 60% stenosis, left ICA has 50% stenosis. Patient also had hypoglycemia with glucose as low as 44.

## 2023-01-08 NOTE — Progress Notes (Signed)
Code stroke activated at [place] and Dr. Curly Shores already on screen, patient already in [place] patient not a TNK candidate per Dr. Curly Shores. 1810 TSRN off cart.   Autumn Arnold, [place]

## 2023-01-08 NOTE — Assessment & Plan Note (Signed)
-   Per outpatient clinic current medication: Patient takes gabapentin 600 mg 4 times daily - Given her weakness and concerns for changes in mentation and altered mental status, I have ordered gabapentin at 400 mg p.o. 4 times daily - Recommend patient discuss further with outpatient PCP for continued guidance and chronic medication maintenance

## 2023-01-08 NOTE — ED Notes (Signed)
Tele Neuro Assessment with MD

## 2023-01-08 NOTE — ED Provider Notes (Signed)
[facility] Provider Note    Event Date/Time   First MD Initiated Contact with Patient 01/08/23 1803     (approximate)   History   Aphasia   HPI  Autumn Arnold is a 72 y.o. female   Past medical history of ischemic stroke, bilateral carotid artery disease, ataxia, CAD, COPD, GERD, dyslipidemia and hypertension, PVD with critical limb ischemia of the left lower extremity that was operated by Dr. Lucky Cowboy in August 2023 who presents emergency department with a stroke alert.  Has been having frequent falls and unsteadiness leading to 1 side walking for the last 2 days but did not want to come to the emergency department.  Family member noticed slurred speech starting today and convince the patient to come to emergency department.  Stroke code activated negative CT scan neurologic consultant on teleneuro at bedside.  On my exam she has right lower extremity weakness compared to the left but also global weakness throughout.  Mucous membranes are dry.  She has no other complaints of pain except for a skin lesion on her right shoulder that is chronic.  Independent Historian contributed to assessment above: Sister at bedside       Physical Exam   Triage Vital Signs: ED Triage Vitals [01/08/23 1759]  Enc Vitals Group     BP      Pulse      Resp      Temp      Temp src      SpO2      Weight 144 lb 10 oz (65.6 kg)     Height '5\' 4"'$  (1.626 m)     Head Circumference      Peak Flow      Pain Score 0     Pain Loc      Pain Edu?      Excl. in [place]?     Most recent vital signs: Vitals:   01/08/23 1930 01/08/23 2000  BP: (!) 102/44 (!) 145/64  Pulse: 87 97  Resp: (!) 26 (!) 21  SpO2: (!) 88% 95%    General: Awake, no distress.  CV:  Mucous membranes and poor skin turgor looks clinically dehydrated Resp:  Normal effort.  Abd:  No distention.  Other:  Both lower extremities feel warm and well-perfused no tenderness to palpation and posterior tibial pulses  are faint but present bilaterally equal.  She has motor weakness to the right lower extremity compared to the left and bilateral upper extremity weakness but equal mild.  No obvious facial asymmetry.  Abdomen soft and nontender lungs clear.  Neck is supple with full range of motion.  She is alert and oriented but speaks slowly and slow to respond.   ED Results / Procedures / Treatments   Labs (all labs ordered are listed, but only abnormal results are displayed) Labs Reviewed  CBC - Abnormal; Notable for the following components:      Result Value   Hemoglobin 11.3 (*)    HCT 34.7 (*)    All other components within normal limits  CK - Abnormal; Notable for the following components:   Total CK 1,194 (*)    All other components within normal limits  RESP PANEL BY RT-PCR (RSV, FLU A&B, COVID)  RVPGX2  PROTIME-INR  APTT  DIFFERENTIAL  COMPREHENSIVE METABOLIC PANEL  ETHANOL  LACTIC ACID, PLASMA  LACTIC ACID, PLASMA  URINALYSIS, W/ REFLEX TO CULTURE (INFECTION SUSPECTED)  I-STAT CREATININE, ED  CBG MONITORING, ED  I ordered and reviewed the above labs they are notable for she has a CK of 1100  EKG  ED ECG REPORT I, Lucillie Garfinkel, the attending physician, personally viewed and interpreted this ECG.   Date: 01/08/2023  EKG Time: 1820  Rate: 100  Rhythm: sinus tachy  Axis: nl  Intervals:none  ST&T Change: no stemi    RADIOLOGY I independently reviewed and interpreted CT head and see no obvious bleeding or midline shift   PROCEDURES:  Critical Care performed: Yes, see critical care procedure note(s)  .Critical Care  Performed by: Lucillie Garfinkel, MD Authorized by: Lucillie Garfinkel, MD   Critical care provider statement:    Critical care time (minutes):  30   Critical care was time spent personally by me on the following activities:  Development of treatment plan with patient or surrogate, discussions with consultants, evaluation of patient's response to treatment,  examination of patient, ordering and review of laboratory studies, ordering and review of radiographic studies, ordering and performing treatments and interventions, pulse oximetry, re-evaluation of patient's condition and review of old charts    MEDICATIONS ORDERED IN ED: Medications  sodium chloride flush (NS) 0.9 % injection 3 mL (3 mLs Intravenous Given 01/08/23 1900)  sodium chloride 0.9 % bolus 1,000 mL (1,000 mLs Intravenous New Bag/Given 01/08/23 2005)  iohexol (OMNIPAQUE) 350 MG/ML injection 75 mL (75 mLs Intravenous Contrast Given 01/08/23 1952)    External physician / consultants:  I spoke with Dr. Curly Shores of neurology regarding care plan for this patient.   IMPRESSION / MDM / ASSESSMENT AND PLAN / ED COURSE  I reviewed the triage vital signs and the nursing notes.                                Patient's presentation is most consistent with acute presentation with potential threat to life or bodily function.  Differential diagnosis includes, but is not limited to, CVA, intracranial bleeding, sepsis/infection, viral URI, bacterial pneumonia, urinary tract infection, rhabdomyolysis, acute traumatic injury, dysrhythmia, ACS, PVD   The patient is on the cardiac monitor to evaluate for evidence of arrhythmia and/or significant heart rate changes.  MDM: Patient with frequent falls lives at home with husband who has dementia.  History of CVA and multiple comorbidities including PVD and ataxia.  Concern for stroke evaluated by teleneurology and advanced imaging is pending.  She is a CK of around 1200 concerning for acute traumatic rhabdomyolysis in the setting of frequent falls.  On secondary survey she has no signs of acute traumatic injuries and she is able to range all extremities albeit weaker on the right no pain I doubt fracture or dislocation intrathoracic or intra-abdominal emergent injuries.  Will check for infection with basic labs, chest x-ray, viral swabs, urinalysis lactic  acids.  Give fluids for elevated CK.  I do not think she has critical limb ischemia because both lower extremities feel warm and well-perfused, add ABIs.  Will be admitted for rhabdo and frequent falls, neurologic workup is ongoing.        FINAL CLINICAL IMPRESSION(S) / ED DIAGNOSES   Final diagnoses:  Aphasia  Weakness  Frequent falls  Traumatic rhabdomyolysis, initial encounter ([place])     Rx / DC Orders   ED Discharge Orders     None        Note:  This document was prepared using Dragon voice recognition software and may include unintentional dictation errors.  Lucillie Garfinkel, MD 01/08/23 2013

## 2023-01-08 NOTE — ED Triage Notes (Signed)
Pt presents to the ED via [facility] due to slurred speech noticed today around 1705. Pt's LKW was Sunday. On Monday patient had multiple falls and a  "Lean" due to weakness. Pt is currently taking a blood thinner, possibly Plavix. Per EMS family is also c/o increasing weakness today. Pt s A&Ox4.  EMS vitals: CBG 115 BP 112/51 HR 108 100% RA

## 2023-01-08 NOTE — Assessment & Plan Note (Signed)
-   Mild presumed secondary to increasing fall - Status post sodium chloride 1 L bolus per EDP; ordered LR infusion 150 mL/h, 1 day ordered - Recheck CK in the a.m.

## 2023-01-08 NOTE — Assessment & Plan Note (Signed)
-   As needed nicotine patch ordered 

## 2023-01-08 NOTE — Assessment & Plan Note (Signed)
-   Per neurology, check for reversible causes - B12, thiamine, B6, B3, RPR, HIV, TSH - Routine EEG

## 2023-01-08 NOTE — Consult Note (Signed)
[facility] Telemedicine Consult  Requesting Provider: Lucillie Garfinkel Consult Participants: Patient, bedside nurse, atrium nurse, sister by phone Location of the provider: [place], [place] Location of the patient: [facility]  This consult was provided via telemedicine with 2-way video and audio communication. The patient/family was informed that care would be provided in this way and agreed to receive care in this manner.    CC: No clear complaint from patient, slurred speech increased today per EMS and right lean since Sunday per family   History is obtained from: Sister, chart review, limited from patient due to mental status   HPI: Autumn Arnold is a 72 y.o. with a past medical history significant for multiple vascular risk factors including atrial fibrillation not on anticoagulation, hypertension, diabetes, hyperlipidemia, peripheral artery disease, prior stroke with residual right-sided weakness reported in chart (no residual weakness per family), obstructive sleep apnea, ongoing tobacco abuse, COPD, right arm squamous cell cancer [place], concern for dementia leading to medication nonadherence, history of left lower extremity critical limb ischemia s/p aortic and bilateral iliac stenting complicated by retroperitoneal bleed.  Unfortunately history is somewhat limited as the patient has some cognitive impairment noted by primary care physician at baseline and lives with the husband who also has known dementia.  Sister is able to provide some history.  She notes that the patient reported to her that she thinks she had a "light stroke "on February 9 last month (the patient's birthday).  She reports that these late stroke symptoms are shaking of 1 or both legs and "feeling weird".  Sunday night she did have a great deal of difficulty overnight, falling out of bed and having a great deal of difficulty getting up.  She was able to get up but Monday fell again.  She was found by her sister sitting  soaked in wetness, possibly secondary to urinary incontinence.  She has also been complaining about both of her legs hurting and her left foot feeling cold  Per chart review she had SMA, aortic and bilateral iliac stents placed on 06/23/2021 and in the setting of dual antiplatelet therapy for this developed a retroperitoneal bleed on 06/24/2021 requiring blood transfusion.  Unclear if this may be the reason she has not been on anticoagulation for her known atrial fibrillation  LKW: Sunday evening Thrombolytic given?: No, out of the window IR Thrombectomy? No, exam not c/w new LVO Modified Rankin Scale: 3-Moderate disability-requires help but walks WITHOUT assistance Time of teleneurologist evaluation: 5:54 PM  Exam: There were no vitals filed for this visit. Vitals verbally reported to me heart rate 101, satting 98% on room air, respiratory rate of 26, blood pressure 113/81, glucose 115 per EMS   General: Mildly confused but no acute distress Pulmonary: breathing comfortably  NIH Stroke scale 1A: Level of Consciousness - 1 drowsiness 1B: Ask Month and Age - 2 reported she is 72 years old, unable to state month 1C: 'Blink Eyes' & 'Squeeze Hands' - 0 2: Test Horizontal Extraocular Movements - 0 3: Test Visual Fields - 0 4: Test Facial Palsy - 0 5A: Test Left Arm Motor Drift - 1    5B: Test Right Arm Motor Drift - 1 6A: Test Left Leg Motor Drift - 1 6B: Test Right Leg Motor Drift - 1  Tremor present in all 4 extremities but appears to be asterixis on video examination 7: Test Limb Ataxia - 0 (asterixis/in proportion to weakness) 8: Test Sensation - 0 9: Test Language/Aphasia- 1 some speech disfluency 10:  Test Dysarthria - 0 11: Test Extinction/Inattention - 0 NIHSS score: 8   Imaging Reviewed:   Head CT personally reviewed, no clear acute intracranial process 1. No acute intracranial pathology. 2. Remote infarcts in the right thalamus, left corona radiata, and right occipital  lobe, new in the right occipital lobe since 2023.  Labs reviewed in epic and pertinent values follow:  Basic Metabolic Panel: Recent Labs  Lab 01/08/23 1832  NA 138  K 3.9  CL 106  CO2 22  GLUCOSE 81  BUN 8  CREATININE 0.86  CALCIUM 9.3    CBC: Recent Labs  Lab 01/08/23 1832  WBC 10.1  NEUTROABS 7.7  HGB 11.3*  HCT 34.7*  MCV 86.8  PLT 202    Coagulation Studies: Recent Labs    01/08/23 1832  LABPROT 12.7  INR 1.0      Assessment: Given last known well and exam not consistent with a new large vessel occlusion, patient does not meet criteria for emergent stroke intervention.  However she does have a new chronic stroke from her last CT head 8 months ago and concern for progressive cognitive decline recently.  Her examination by video appears most consistent with a toxic/metabolic encephalopathy (but there is no clear support for this on initial labs) versus dementia related confusion; will additionally check reversible causes of dementia  Recommendations:   # Stroke-like symptoms - Stroke labs TSH, HgbA1c, fasting lipid panel - MRI brain  - CTA head/neck - Frequent neuro checks - Echocardiogram - Continue home DAPT at this time once she passes swallow eval - Further inquiry as to risk/benefit of anticoagulation for PAF, would prefer to stop at least one antiplatelet agent if [facility] is started (and would hold of starting pending MRI brain results) - Risk factor modification - Telemetry monitoring; - Blood pressure goal  - Permissive hypertension to 220/120 until MRI negative for acute stroke (if positive, continue this at least overnight), otherwise gradual normotension - PT consult, OT consult, Speech consult, unless patient is back to baseline - Neurology to follow  # Dementia / cognitive impairment - B12, thiamine, B6, B3, RPR, HIV, TSH for reversible causes  - Routine EEG    This patient is receiving care for possible acute neurological changes. There was  60 minutes of care by this provider at the time of service, including time for direct evaluation via telemedicine, review of medical records, imaging studies and discussion of findings with providers, the patient and/or family.  Consult level time thresholds, 20 min (1), 40 min (2), 55 min (3), 80 min (4), 110 min (5)  Lesleigh Noe MD-PhD [facility] [phone]   If 8pm-8am, please page neurology on call as listed in [place].  CRITICAL CARE Performed by: Lorenza Chick   Total critical care time: 60 minutes  Critical care time was exclusive of separately billable procedures and treating other patients.  Critical care was necessary to treat or prevent imminent or life-threatening deterioration.  Critical care was time spent personally by me on the following activities: development of treatment plan with patient and/or surrogate as well as nursing, discussions with consultants, evaluation of patient's response to treatment, examination of patient, obtaining history from patient or surrogate, ordering and performing treatments and interventions, ordering and review of laboratory studies, ordering and review of radiographic studies, pulse oximetry and re-evaluation of patient's condition.

## 2023-01-08 NOTE — Assessment & Plan Note (Signed)
-   Ativan 0.5 mg p.o. every 6 hours as needed for anxiety, 2 doses for

## 2023-01-08 NOTE — Progress Notes (Addendum)
Received message from nursing, regarding patient's blood glucose check being 44 and on repeat was 38  Hypoglycemia, asymptomatic - D50, 1 amp as needed for hypoglycemia 3 days ordered - CBG monitoring, every 2 hours, 2 times check ordered - Discussed with nursing - Patient may need to be transferred to PCU, discussed with cross coverage and nursing  Dr. Tobie Poet

## 2023-01-08 NOTE — H&P (Addendum)
History and Physical   Autumn Arnold [id number] DOB: 1951-07-19 DOA: 01/08/2023  PCP: Autumn Body, MD  Outpatient Specialists: Dr. [facility] cardiology Patient coming from: Home via EMS  I have personally briefly reviewed patient's old medical records in [place].  Chief Concern: Frequent falls  HPI: Ms. Autumn Arnold is a 72 year old female with history of hyperlipidemia, GERD, non-insulin-dependent diabetes mellitus, depression, anxiety, history of CVA, neuropathy, who presents emergency department for chief concerns of frequent falls, and speech slurring that started at approximately 1700 on day of admission.  Vitals in the ED showed temperature 99.1, respiration rate 22, heart rate 87, blood pressure 111 over 85, SpO2 of 94% on room air.  Serum sodium is 138, potassium 3.9, chloride 106, bicarb 22, BUN of 8, serum creatinine of 0.86, EGFR greater than 60, nonfasting blood glucose 81, WBC 10.1, hemoglobin 11.3, platelets of 202.  CK is elevated 1194.  ED treatment: Sodium chloride 1 L bolus. ------------------------------- At bedside, she is able to tell me her name, her age, the current calendar year, current location.  She is able to identify her daughter Autumn Arnold at bedside.  Patient and daughter reports that she thinks she may have had a stroke on 12/14/2022.  She reports that she just felt really weird and had shaking of her legs.  Since 2/9, patient reports she has been feeling blurry and that she has been having difficulty with her vision.  It is unclear if this is her right or her left eye.  She reports the right-sided weakness has resolved.  She does have word finding on my evaluation however daughter states that this is her baseline.  She denies chest pain, shortness of breath, abdominal pain, dysuria, hematuria, diarrhea, subjective fever.  Social history: She currently lives at home with her husband.  She denies tobacco use, daughter states  that she still uses tobacco products.  She is a former alcohol user.  ROS: Constitutional: no weight change, no fever ENT/Mouth: no sore throat, no rhinorrhea Eyes: no eye pain, + vision changes Cardiovascular: no chest pain, no dyspnea,  no edema, no palpitations Respiratory: no cough, no sputum, no wheezing Gastrointestinal: no nausea, no vomiting, no diarrhea, no constipation Genitourinary: no urinary incontinence, no dysuria, no hematuria Musculoskeletal: no arthralgias, no myalgias Skin: no skin lesions, no pruritus, Neuro: + weakness, no loss of consciousness, no syncope Psych: no anxiety, no depression, + decrease appetite Heme/Lymph: no bruising, no bleeding  ED Course: Discussed with emergency medicine provider, patient requiring hospitalization for chief concerns of strokelike symptoms and increasing fall.  Assessment/Plan  Principal Problem:   Stroke-like symptom Active Problems:   Type 2 diabetes mellitus with hyperlipidemia (HCC)   Anxiety and depression   Tobacco dependence   Hyperlipidemia   DNR (do not resuscitate)   COPD (chronic obstructive pulmonary disease) (HCC)   Weakness   PVD (peripheral vascular disease) (HCC)   Elevated CK   Dementia (HCC)   Cognitive decline   Polypharmacy   History of CVA (cerebrovascular accident)   Assessment and Plan:  * Stroke-like symptom Stroke-like symptoms - Neurology has been consulted and we appreciate further recommendations - Complete echo, MRI of the brain  - Fasting lipid and A1c ordered - Permissive hypertension per neurology recommendations - Frequent neuro vascular checks - N.p.o. pending swallow study - PT, OT, TOC - Fall precaution  History of CVA (cerebrovascular accident) - Atorvastatin, Plavix resumed  Polypharmacy - Per outpatient clinic current medication: Patient takes gabapentin 600  mg 4 times daily - Given her weakness and concerns for changes in mentation and altered mental status, I have  ordered gabapentin at 400 mg p.o. 4 times daily - Recommend patient discuss further with outpatient PCP for continued guidance and chronic medication maintenance  Cognitive decline - Per neurology, check for reversible causes - B12, thiamine, B6, B3, RPR, HIV, TSH - Routine EEG   Elevated CK - Mild presumed secondary to increasing fall - Status post sodium chloride 1 L bolus per EDP; ordered LR infusion 150 mL/h, 1 day ordered - Recheck CK in the a.m.  Hyperlipidemia - Atorvastatin 80 mg nightly resumed  Tobacco dependence - As needed nicotine patch ordered  Anxiety and depression - Ativan 0.5 mg p.o. every 6 hours as needed for anxiety, 2 doses for  AM team to complete med reconciliation  Chart reviewed.   DVT prophylaxis: Enoxaparin 01/09/2023 Code Status: DNR, discussed with patient and daughter Autumn Arnold at bedside Diet: N.p.o. pending swallow study at bedside Family Communication: Updated daughter Autumn Arnold at bedside with patient's permission Disposition Plan: Pending workup Consults called: neurology, pt, ot, toc Admission status: Telemetry medical, observation  Past Medical History:  Diagnosis Date   Anxiety    Aortic atherosclerosis ([place])    Arthritis    Asthma    Ataxic gait    a.) requires gait aids for assitance   Atherosclerotic PVD with intermittent claudication ([place])    a.) s/p LEFT femoral endartarectomy and LEFT iliac stent 04/11/2022   Bilateral carotid artery disease ([place]) 11/07/2015   a.) doppler 11/07/2015: <50 BICA; b.) doppler 06/23/2021: near occ RICA< < [id number] LICA; c.) doppler [id number]: occluded RICA, mod plaque LICA   Bilateral lower extremity edema    CAD (coronary artery disease)    Chronic cough    COPD (chronic obstructive pulmonary disease) (HCC)    CVA (cerebral vascular accident) ([place]) 11/07/2015   a.) posterior superior LEFT lenticular nucleus to posterior LEFT corona radiata   DDD (degenerative disc disease), cervical    a.) s/p C5-C6  fusion   DOE (dyspnea on exertion)    GERD (gastroesophageal reflux disease)    History of bilateral cataract extraction 2017   History of left cataract extraction    Hyperlipidemia    Hypertension    Ischemic cerebrovascular accident (CVA) ([place]) 06/23/2021   a.) superior LEFT frontal and parietal ischemia   Ischemic cerebrovascular accident (CVA) ([place]) 04/04/2022   a.) subcentimeter cortical/subcortical aspect of RIGHT frontoparietal region --> acute to early subacte ischemic infarct   Long term current use of antithrombotics/antiplatelets    a.) DAPT (ASA + clopidogrel)   Lower extremity weakness    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[place])    a.) s/p PTA and stenting 06/23/2022 --> SMA, mid-infrarenal, BILATERAL CIA; b.) s/p PTA and stenting 09/25/2022: SMA, BILATERAL CIA, infrarenal aorta, LEFT CFA   OSA on CPAP    PAF (paroxysmal atrial fibrillation) ([place])    Right-sided lacunar infarction ([place]) 02/09/2010   a.) non-hemorrhagic   Small fiber neuropathy    T2DM (type 2 diabetes mellitus) ([place])    Vitamin D deficiency    Wheezing    Past Surgical History:  Procedure Laterality Date   ABDOMINAL HYSTERECTOMY     ANTERIOR FUSION CERVICAL SPINE     CARPAL TUNNEL RELEASE Bilateral    CATARACT EXTRACTION W/PHACO Left 03/12/2016   Procedure: CATARACT EXTRACTION PHACO AND INTRAOCULAR LENS PLACEMENT ([place]);  Surgeon: Estill Cotta, MD;  Location: [facility] ORS;  Service: Ophthalmology;  Laterality: Left;  [place] 01:12 AP% 23.4 CDE 29.83 fluid pack lot # [id number]   CATARACT EXTRACTION W/PHACO Right 04/16/2016   Procedure: CATARACT EXTRACTION PHACO AND INTRAOCULAR LENS PLACEMENT (IOC);  Surgeon: Estill Cotta, MD;  Location: [facility] ORS;  Service: Ophthalmology;  Laterality: Right;  [place] 01:17 AP% 22.5 CDE 35.08 Fluid pack lot # [id number]   CESAREAN SECTION     COLONOSCOPY WITH PROPOFOL N/A 08/12/2018   Procedure: COLONOSCOPY WITH PROPOFOL;  Surgeon: [place], Benay Pike, MD;  Location: [facility]  ENDOSCOPY;  Service: Gastroenterology;  Laterality: N/A;   ELBOW SURGERY     Tendonitis   ENDARTERECTOMY Right 10/04/2022   Procedure: ENDARTERECTOMY CAROTID;  Surgeon: Algernon Huxley, MD;  Location: [facility] ORS;  Service: Vascular;  Laterality: Right;   ENDARTERECTOMY FEMORAL Left 04/11/2022   Procedure: ENDARTERECTOMY FEMORAL;  Surgeon: Algernon Huxley, MD;  Location: [facility] ORS;  Service: Vascular;  Laterality: Left;   ESOPHAGOGASTRODUODENOSCOPY (EGD) WITH PROPOFOL N/A 08/12/2018   Procedure: ESOPHAGOGASTRODUODENOSCOPY (EGD) WITH PROPOFOL;  Surgeon: [place], Benay Pike, MD;  Location: [facility] ENDOSCOPY;  Service: Gastroenterology;  Laterality: N/A;   INCONTINENCE SURGERY     INSERTION OF ILIAC STENT Left 04/11/2022   Procedure: INSERTION OF ILIAC STENT;  Surgeon: Algernon Huxley, MD;  Location: [facility]S;  Service: Vascular;  Laterality: Left;   INSERTION OF RETROGRADE CAROTID STENT Right 10/04/2022   Procedure: INSERTION OF RETROGRADE CAROTID STENT;  Surgeon: Algernon Huxley, MD;  Location: [facility];  Service: Vascular;  Laterality: Right;   LOWER EXTREMITY ANGIOGRAPHY Left 06/23/2021   Procedure: Lower Extremity Angiography;  Surgeon: Katha Cabal, MD;  Location: [facility];  Service: Cardiovascular;  Laterality: Left;   LOWER EXTREMITY ANGIOGRAPHY Left 04/05/2022   Procedure: Lower Extremity Angiography;  Surgeon: Algernon Huxley, MD;  Location: [facility];  Service: Cardiovascular;  Laterality: Left;   LOWER EXTREMITY ANGIOGRAPHY Left 06/25/2022   Procedure: Lower Extremity Angiography;  Surgeon: Algernon Huxley, MD;  Location: [facility];  Service: Cardiovascular;  Laterality: Left;   LOWER EXTREMITY ANGIOGRAPHY Left 07/11/2022   Procedure: Lower Extremity Angiography;  Surgeon: Algernon Huxley, MD;  Location: [facility];  Service: Cardiovascular;  Laterality: Left;   TONSILLECTOMY     Social History:  reports that she has been smoking cigarettes. She has been smoking  an average of 1 pack per day. She has never used smokeless tobacco. She reports that she does not currently use alcohol. She reports that she does not use drugs.  Allergies  Allergen Reactions   Lisinopril Other (See Comments)    Tongue burning sensation   Family History  Problem Relation Age of Onset   CAD Mother    Hypertension Mother    Arthritis Mother    Diabetes Father    CAD Father    Family history: Family history reviewed and not pertinent  Prior to Admission medications   Medication Sig Start Date End Date Taking? Authorizing Provider  albuterol (VENTOLIN HFA) 108 (90 Base) MCG/ACT inhaler Inhale 2 puffs into the lungs as needed.    [provider]  alendronate (FOSAMAX) 70 MG tablet Take 70 mg by mouth once a week. Take with a full glass of water on an empty stomach.    [provider]  amitriptyline (ELAVIL) 150 MG tablet Take 150 mg by mouth daily. Reported on 12/20/2015    [provider]  aspirin EC 81 MG EC tablet Take 1 tablet (81 mg total) by mouth  daily. 11/08/15   Gladstone Lighter, MD  atorvastatin (LIPITOR) 80 MG tablet Take 80 mg by mouth daily. 06/13/21   [provider]  busPIRone (BUSPAR) 5 MG tablet Take 5 mg by mouth 2 (two) times daily. 04/18/21   [provider]  Calcium Carb-Cholecalciferol 600-400 MG-UNIT TABS Take 1 tablet by mouth daily.    [provider]  clopidogrel (PLAVIX) 75 MG tablet Take 1 tablet (75 mg total) by mouth daily. 09/24/22   Kris Hartmann, NP  gabapentin (NEURONTIN) 600 MG tablet Take 1,200 mg by mouth 2 (two) times daily. 04/18/21   [provider]  metFORMIN (GLUCOPHAGE) 1000 MG tablet Take 1,000 mg by mouth 2 (two) times daily. 06/13/21   [provider]  metoprolol tartrate (LOPRESSOR) 25 MG tablet Take 0.5 tablets (12.5 mg total) by mouth 2 (two) times daily. 04/13/22   Emeterio Reeve, DO  montelukast (SINGULAIR) 10 MG tablet Take 10 mg by mouth daily. 06/13/21    [provider]  Multiple Vitamin (MULTIVITAMIN WITH MINERALS) TABS tablet Take 1 tablet by mouth daily. 06/29/21   Fritzi Mandes, MD  oxyCODONE-acetaminophen (PERCOCET/ROXICET) 5-325 MG tablet Take 1 tablet by mouth every 4 (four) hours as needed for moderate pain. 10/05/22   Pace, Cyndie Chime, NP  pantoprazole (PROTONIX) 40 MG tablet Take 40 mg by mouth daily. 01/30/21   [provider]  senna-docusate (SENOKOT-S) 8.6-50 MG tablet Take 2 tablets by mouth at bedtime as needed for mild constipation. Patient not taking: Reported on 09/24/2022 04/13/22   Emeterio Reeve, DO  sertraline (ZOLOFT) 50 MG tablet Take 3 tablets (150 mg total) by mouth daily. 06/28/21   Fritzi Mandes, MD  tiZANidine (ZANAFLEX) 4 MG tablet Take 4 mg by mouth 2 (two) times daily. 05/22/21   [provider]  vitamin B-12 (CYANOCOBALAMIN) 1000 MCG tablet Take 1,000 mcg by mouth daily. Reported on 12/20/2015    [provider]   Physical Exam: Vitals:   01/08/23 1759 01/08/23 1930 01/08/23 2000 01/08/23 2145  BP:  (!) 102/44 (!) 145/64 115/62  Pulse:  87 97 97  Resp:  (!) 26 (!) 21 18  Temp:   99.1 F (37.3 C) 97.6 F (36.4 C)  TempSrc:   Oral Oral  SpO2:  (!) 88% 95% 94%  Weight: 65.6 kg     Height: '5\' 4"'$  (1.626 m)      Constitutional: appears age-appropriate, frail, chronically ill, NAD, calm, comfortable Eyes: PERRL, lids and conjunctivae normal ENMT: Mucous membranes are moist. Posterior pharynx clear of any exudate or lesions. Age-appropriate dentition. Hearing appropriate Neck: normal, supple, no masses, no thyromegaly Respiratory: clear to auscultation bilaterally, no wheezing, no crackles. Normal respiratory effort. No accessory muscle use.  Cardiovascular: Regular rate and rhythm, no murmurs / rubs / gallops. No extremity edema. 2+ pedal pulses. No carotid bruits.  Abdomen: no tenderness, no masses palpated, no hepatosplenomegaly. Bowel sounds positive.  Musculoskeletal: no clubbing /  cyanosis. No joint deformity upper and lower extremities. Good ROM, no contractures, no atrophy. Normal muscle tone.  Skin: no rashes, lesions, ulcers. No induration Neurologic: Sensation intact. Strength 5/5 in all 4.  Psychiatric: Unable to assess judgment and insight. Alert and oriented x 3.  Depressed mood.   EKG: independently reviewed, showing sinus tachycardia with rate of 100, QTc 462  Chest x-ray on Admission: I personally reviewed and I agree with radiologist reading as below.  DG Chest 1 View  Result Date: 01/08/2023 CLINICAL DATA:  Altered mental status. EXAM:  CHEST  1 VIEW COMPARISON:  Portable chest 04/30/2022 FINDINGS: The cardiomediastinal silhouette and vasculature are normal apart from calcifications in the transverse aorta. There is new hazy opacity in the right infrahilar area which could be due to atelectasis or pneumonitis versus asymmetric overlapping structures. Remaining lungs are clear. No pleural effusion seen. There has been interval stenting of the right common carotid artery. No acute osseous findings. IMPRESSION: 1. New hazy opacity in the right infrahilar area which could be due to atelectasis or pneumonitis versus asymmetric overlapping structures. PA and lateral study in full inspiration is recommended. 2. Interval stenting of the right common carotid artery. Electronically Signed   By: Telford Nab M.D.   On: 01/08/2023 20:56   CT ANGIO HEAD NECK W WO CM  Result Date: 01/08/2023 CLINICAL DATA:  Stroke, determine embolic source. EXAM: CT ANGIOGRAPHY HEAD AND NECK TECHNIQUE: Multidetector CT imaging of the head and neck was performed using the standard protocol during bolus administration of intravenous contrast. Multiplanar CT image reconstructions and MIPs were obtained to evaluate the vascular anatomy. Carotid stenosis measurements (when applicable) are obtained utilizing NASCET criteria, using the distal internal carotid diameter as the denominator. RADIATION DOSE  REDUCTION: This exam was performed according to the departmental dose-optimization program which includes automated exposure control, adjustment of the mA and/or kV according to patient size and/or use of iterative reconstruction technique. CONTRAST:  53m OMNIPAQUE IOHEXOL 350 MG/ML SOLN COMPARISON:  Head CT from earlier today FINDINGS: CTA NECK FINDINGS Aortic arch: Extensive atheromatous plaque with 3 vessel branching. Right carotid system: Severe atheromatous plaque affecting the brachiocephalic and common carotid with common carotid stent. The stent appears widely patent. Proximal ICA stenosis accentuated by vessel kink with narrowing measuring 60 %, suspect this is at an anastomosis from prior carotid endarterectomy. Left carotid system: Extensive atheromatous plaque affecting the common carotid and proximal ICA. Proximal ICA stenosis measures 50% as measured on axial images due to posterior wall plaque. No ulceration. Vertebral arteries: Extensive proximal subclavian atherosclerosis. Severe narrowing at the right subclavian artery just beyond the stent (at least 75%). The right subclavian and right vertebral artery are under enhancing compared to the left. Bulky calcified plaque causes high-grade narrowing of the right vertebral artery. Skeleton: Generalized cervical spine degeneration with mild C4-5 anterolisthesis. Other neck: No acute or aggressive finding. Upper chest: No acute finding Review of the MIP images confirms the above findings CTA HEAD FINDINGS Anterior circulation: Atheromatous calcification of the carotid siphons. No branch occlusion, beading, or aneurysm. Posterior circulation: Atheromatous plaque affects the V4 segments without high-grade stenosis. The basilar is smoothly contoured and widely patent. No branch occlusion, beading, or aneurysm. Fetal type right PCA. Venous sinuses: Unremarkable Anatomic variants: As above Review of the MIP images confirms the above findings IMPRESSION: 1. No  emergent vascular finding. 2. Severe atherosclerosis: *75% narrowing of the proximal right subclavian artery with additional severe stenosis at the right vertebral origin. The right subclavian and right vertebral artery or more faintly enhancing than the left. *Post treatment right ICA with proximal ICA stenosis measuring 60%. *50% narrowing at the proximal left ICA. Electronically Signed   By: JJorje GuildM.D.   On: 01/08/2023 20:18   CT HEAD CODE STROKE WO CONTRAST  Result Date: 01/08/2023 CLINICAL DATA:  Code stroke. Slurred speech and worsening right-sided lesion. EXAM: CT HEAD WITHOUT CONTRAST TECHNIQUE: Contiguous axial images were obtained from the base of the skull through the vertex without intravenous contrast. RADIATION DOSE REDUCTION: This exam was performed  according to the departmental dose-optimization program which includes automated exposure control, adjustment of the mA and/or kV according to patient size and/or use of iterative reconstruction technique. COMPARISON:  CT head 04/30/2022 FINDINGS: Brain: There is no acute intracranial hemorrhage, extra-axial fluid collection, or acute infarct. Background parenchymal volume is normal for age. The ventricles are normal in size. There are remote infarcts in the right thalamus and left corona radiata. There is a remote cortical infarct in the right occipital lobe which is new since the prior study from 2023. The pituitary and suprasellar region are normal. There is no mass lesion. There is no mass effect or midline shift. Vascular: No hyperdense vessel is seen. There is calcification of the bilateral carotid siphons and vertebral arteries. Skull: Normal. Negative for fracture or focal lesion. Sinuses/Orbits: The imaged paranasal sinuses are clear. Bilateral lens implants are in place. The globes and orbits are otherwise unremarkable. Other: None. ASPECTS [facility] Stroke Program Early CT Score) - Ganglionic level infarction (caudate, lentiform  nuclei, internal capsule, insula, M1-M3 cortex): 7 - Supraganglionic infarction (M4-M6 cortex): 3 Total score (0-10 with 10 being normal): 10 IMPRESSION: 1. No acute intracranial pathology. 2. Remote infarcts in the right thalamus, left corona radiata, and right occipital lobe, new in the right occipital lobe since 2023. These results were called by telephone at the time of interpretation on 01/08/2023 at 6:12 pm to provider [facility] , who verbally acknowledged these results. Electronically Signed   By: Valetta Mole M.D.   On: 01/08/2023 18:13    Labs on Admission: I have personally reviewed following labs  CBC: Recent Labs  Lab 01/08/23 1832  WBC 10.1  NEUTROABS 7.7  HGB 11.3*  HCT 34.7*  MCV 86.8  PLT [id number]   Basic Metabolic Panel: Recent Labs  Lab 01/08/23 1832  NA 138  K 3.9  CL 106  CO2 22  GLUCOSE 81  BUN 8  CREATININE 0.86  CALCIUM 9.3   GFR: Estimated Creatinine Clearance: 52.6 mL/min (by C-G formula based on SCr of 0.86 mg/dL).  Liver Function Tests: Recent Labs  Lab 01/08/23 1832  AST 35  ALT 16  ALKPHOS 85  BILITOT 0.5  PROT 7.0  ALBUMIN 3.5   Coagulation Profile: Recent Labs  Lab 01/08/23 1832  INR 1.0   Cardiac Enzymes: Recent Labs  Lab 01/08/23 1832  CKTOTAL 1,194*   CBG: Recent Labs  Lab 01/08/23 1825  GLUCAP 80   Urine analysis:    Component Value Date/Time   COLORURINE YELLOW (A) 01/08/2023 2003   APPEARANCEUR CLEAR (A) 01/08/2023 2003   LABSPEC 1.011 01/08/2023 2003   [place] 5.0 01/08/2023 2003   GLUCOSEU NEGATIVE 01/08/2023 2003   HGBUR NEGATIVE 01/08/2023 2003   [place] NEGATIVE 01/08/2023 2003   [place] NEGATIVE 01/08/2023 2003   PROTEINUR NEGATIVE 01/08/2023 2003   NITRITE NEGATIVE 01/08/2023 2003   LEUKOCYTESUR NEGATIVE 01/08/2023 2003   This document was prepared using Dragon Voice Recognition software and may include unintentional dictation errors.  Dr. Tobie Poet [facility]  If 7PM-7AM, please contact  overnight-coverage provider If 7AM-7PM, please contact day coverage provider [email or web address]  01/08/2023, 10:19 PM

## 2023-01-08 NOTE — Assessment & Plan Note (Signed)
Stroke-like symptoms - Neurology has been consulted and we appreciate further recommendations - Complete echo, MRI of the brain  - Fasting lipid and A1c ordered - Permissive hypertension per neurology recommendations - Frequent neuro vascular checks - N.p.o. pending swallow study - PT, OT, TOC - Fall precaution

## 2023-01-08 NOTE — Assessment & Plan Note (Signed)
-   Atorvastatin, Plavix resumed

## 2023-01-09 ENCOUNTER — Observation Stay
Admit: 2023-01-09 | Discharge: 2023-01-09 | Disposition: A | Discharge status: Home / Self Care | Payer: Medicare PPO | Attending: Internal Medicine | Admitting: Internal Medicine

## 2023-01-09 ENCOUNTER — Ambulatory Visit: Payer: Medicare PPO

## 2023-01-09 ENCOUNTER — Observation Stay: Payer: Medicare PPO

## 2023-01-09 DIAGNOSIS — E162 Hypoglycemia, unspecified: Secondary | ICD-10-CM

## 2023-01-09 DIAGNOSIS — E785 Hyperlipidemia, unspecified: Secondary | ICD-10-CM

## 2023-01-09 DIAGNOSIS — E1169 Type 2 diabetes mellitus with other specified complication: Secondary | ICD-10-CM | POA: Diagnosis not present

## 2023-01-09 DIAGNOSIS — R2990 Unspecified symptoms and signs involving the nervous system: Secondary | ICD-10-CM | POA: Diagnosis not present

## 2023-01-09 LAB — CBC
HCT: 31.6 % — ABNORMAL LOW (ref 36.0–46.0)
Hemoglobin: 10 g/dL — ABNORMAL LOW (ref 12.0–15.0)
MCH: 28.2 pg (ref 26.0–34.0)
MCHC: 31.6 g/dL (ref 30.0–36.0)
MCV: 89.3 fL (ref 80.0–100.0)
Platelets: 179 10*3/uL (ref 150–400)
RBC: 3.54 MIL/uL — ABNORMAL LOW (ref 3.87–5.11)
RDW: 15 % (ref 11.5–15.5)
WBC: 7.1 10*3/uL (ref 4.0–10.5)
nRBC: 0 % (ref 0.0–0.2)

## 2023-01-09 LAB — CK: Total CK: 838 U/L — ABNORMAL HIGH (ref 38–234)

## 2023-01-09 LAB — BASIC METABOLIC PANEL
Anion gap: 7 (ref 5–15)
BUN: 7 mg/dL — ABNORMAL LOW (ref 8–23)
CO2: 24 mmol/L (ref 22–32)
Calcium: 8.4 mg/dL — ABNORMAL LOW (ref 8.9–10.3)
Chloride: 109 mmol/L (ref 98–111)
Creatinine, Ser: 0.79 mg/dL (ref 0.44–1.00)
GFR, Estimated: >60 mL/min (ref >60)
Glucose, Bld: 105 mg/dL — ABNORMAL HIGH (ref 70–99)
Potassium: 4.3 mmol/L (ref 3.5–5.1)
Sodium: 140 mmol/L (ref 135–145)

## 2023-01-09 LAB — VITAMIN B12: Vitamin B-12: 569 pg/mL (ref 180–914)

## 2023-01-09 LAB — ECHOCARDIOGRAM COMPLETE
AR max vel: 2.79 cm2
AV Area VTI: 3.1 cm2
AV Area mean vel: 2.61 cm2
AV Mean grad: 4 mmHg
AV Peak grad: 6.2 mmHg
Ao pk vel: 1.24 m/s
Area-P 1/2: 6.48 cm2
Height: 64 in
P 1/2 time: 412 msec
S' Lateral: 2.6 cm
Weight: 2313.95 oz

## 2023-01-09 LAB — HIV ANTIBODY (ROUTINE TESTING W REFLEX): HIV Screen 4th Generation wRfx: NONREACTIVE

## 2023-01-09 LAB — GLUCOSE, CAPILLARY
Glucose-Capillary: 77 mg/dL (ref 70–99)
Glucose-Capillary: 119 mg/dL — ABNORMAL HIGH (ref 70–99)
Glucose-Capillary: 135 mg/dL — ABNORMAL HIGH (ref 70–99)
Glucose-Capillary: 116 mg/dL — ABNORMAL HIGH (ref 70–99)
Glucose-Capillary: 153 mg/dL — ABNORMAL HIGH (ref 70–99)

## 2023-01-09 LAB — RPR: RPR Ser Ql: NONREACTIVE

## 2023-01-09 LAB — PROCALCITONIN: Procalcitonin: <0.1 ng/mL

## 2023-01-09 NOTE — Evaluation (Signed)
Physical Therapy Evaluation Patient Details Name: Autumn Arnold MRN: [id number] DOB: 03-Jun-1951 Today's Date: 01/09/2023  History of Present Illness  Pt admitted for complaints of slurred speech and multiple falls. History includes HLD, GERD, DM, depression, and anxiety. Previous CVA with R side weakness.  Clinical Impression  Pt is a pleasant 72 year old female who was admitted for complaints of slurred speech and multiple falls. Pt performs bed mobility with supervision, transfers with cga, and ambulation with cga and RW. Pt demonstrates deficits with strength/mobility/balance. Would benefit from skilled PT to address above deficits and promote optimal return to PLOF. Recommend transition to [place] upon discharge from acute hospitalization.       Recommendations for follow up therapy are one component of a multi-disciplinary discharge planning process, led by the attending physician.  Recommendations may be updated based on patient status, additional functional criteria and insurance authorization.  Follow Up Recommendations Home health PT      Assistance Recommended at Discharge Set up Supervision/Assistance  Patient can return home with the following  A little help with walking and/or transfers;Assist for transportation;Help with stairs or ramp for entrance    Equipment Recommendations None recommended by PT  Recommendations for Other Services       Functional Status Assessment Patient has had a recent decline in their functional status and demonstrates the ability to make significant improvements in function in a reasonable and predictable amount of time.     Precautions / Restrictions Precautions Precautions: Fall Restrictions Weight Bearing Restrictions: No      Mobility  Bed Mobility Overal bed mobility: Needs Assistance Bed Mobility: Supine to Sit     Supine to sit: Supervision     General bed mobility comments: safe technique. Cues for scooting out towards EOB     Transfers Overall transfer level: Needs assistance Equipment used: Rolling walker (2 wheels) Transfers: Sit to/from Stand Sit to Stand: Min guard           General transfer comment: adjusted walker height per patient request. She reports she feels weak and preferred to use RW this session. Upright posture    Ambulation/Gait Ambulation/Gait assistance: Min guard Gait Distance (Feet): 400 Feet Assistive device: Rolling walker (2 wheels) Gait Pattern/deviations: Step-through pattern       General Gait Details: ambulated in hallway with reciprocal gait pattern. Noted to veer to the left several times and would of ran into obstacles if therapist didn't intervene. Pt reports poor eye vision  Stairs            Wheelchair Mobility    Modified Rankin (Stroke Patients Only)       Balance Overall balance assessment: History of Falls                               Standardized Balance Assessment Standardized Balance Assessment : Dynamic Gait Index   Dynamic Gait Index Level Surface: Normal Change in Gait Speed: Mild Impairment Gait with Horizontal Head Turns: Moderate Impairment Gait with Vertical Head Turns: Mild Impairment       Pertinent Vitals/Pain Pain Assessment Pain Assessment: No/denies pain    Home Living Family/patient expects to be discharged to:: Private residence Living Arrangements: Spouse/significant other Available Help at Discharge: Family;Friend(s);Available PRN/intermittently Type of Home: House Home Access: Stairs to enter Entrance Stairs-Rails: Left Entrance Stairs-Number of Steps: 1   Home Layout: One level Home Equipment: Rollator (4 wheels);Rolling Walker (2 wheels);Cane - single point (  forerm crutch) Additional Comments: multiple falls recently    Prior Function Prior Level of Function : Independent/Modified Independent             Mobility Comments: has been using forearm crutch for ambulation ADLs Comments: pt  reports MOD I with ADL, IADLs via husbands, husband has been driving to grocery store, doing grocery shopping     Hand Dominance        Extremity/Trunk Assessment   Upper Extremity Assessment Upper Extremity Assessment: Defer to OT evaluation    Lower Extremity Assessment Lower Extremity Assessment: Generalized weakness (L LE with slight numbness in foot, pt reports chronic.)       Communication   Communication: No difficulties  Cognition Arousal/Alertness: Awake/alert Behavior During Therapy: WFL for tasks assessed/performed Overall Cognitive Status: Within Functional Limits for tasks assessed                                          General Comments      Exercises Other Exercises Other Exercises: scored 8/12 on modified DGI demonstrating impaired balance   Assessment/Plan    PT Assessment Patient needs continued PT services  PT Problem List Decreased strength;Decreased activity tolerance;Decreased balance;Decreased mobility;Impaired sensation       PT Treatment Interventions Gait training;Stair training;Therapeutic exercise;Balance training    PT Goals (Current goals can be found in the Care Plan section)  Acute Rehab PT Goals Patient Stated Goal: to go home PT Goal Formulation: With patient Time For Goal Achievement: 01/23/23 Potential to Achieve Goals: Good    Frequency Min 2X/week     Co-evaluation               AM-PAC PT "6 Clicks" Mobility  Outcome Measure Help needed turning from your back to your side while in a flat bed without using bedrails?: None Help needed moving from lying on your back to sitting on the side of a flat bed without using bedrails?: None Help needed moving to and from a bed to a chair (including a wheelchair)?: A Little Help needed standing up from a chair using your arms (e.g., wheelchair or bedside chair)?: A Little Help needed to walk in hospital room?: A Little Help needed climbing 3-5 steps with a  railing? : A Little 6 Click Score: 20    End of Session Equipment Utilized During Treatment: Gait belt Activity Tolerance: Patient tolerated treatment well Patient left: in bed;with bed alarm set (per patient preference, educated to sit up for meals) Nurse Communication: Mobility status PT Visit Diagnosis: Muscle weakness (generalized) (M62.81);Repeated falls (R29.6);History of falling (Z91.81);Unsteadiness on feet (R26.81)    Time: [id number] PT Time Calculation (min) (ACUTE ONLY): 17 min   Charges:   PT Evaluation $PT Eval Low Complexity: 1 Low PT Treatments $Gait Training: 8-22 mins        Greggory Stallion, PT, DPT, GCS [phone]   Diyari Cherne 01/09/2023, 11:50 AM

## 2023-01-09 NOTE — Progress Notes (Signed)
*  PRELIMINARY RESULTS* Echocardiogram 2D Echocardiogram has been performed.  Sherrie Sport 01/09/2023, 7:42 AM

## 2023-01-09 NOTE — Evaluation (Signed)
Occupational Therapy Evaluation Patient Details Name: Autumn Arnold MRN: [id number] DOB: [birth year] Today's Date: 01/09/2023   History of Present Illness Pt admitted for complaints of slurred speech and multiple falls. History includes HLD, GERD, DM, depression, and anxiety. Previous CVA with R side weakness.   Clinical Impression   Patient/spouse agreeable to OT evaluation. Pt presenting with decreased independence in self care, balance, functional mobility/transfers, and endurance. PTA pt lived with spouse, was Mod I for ADLs, and received assistance as needed for IADLs. Pt currently functioning at supervision for bed mobility, Min guard for toilet transfer, and Min guard for functional mobility to the bathroom using RW. She completed peri care and sinkside grooming tasks with set up-supervision this date. Pt will benefit from acute OT to increase overall independence in the areas of ADLs, functional mobility in order to safely discharge to next venue of care. OT recommends ongoing therapy upon discharge to maximize safety and independence with ADLs, decrease fall risk, decrease caregiver burden, and promote return to PLOF.     Recommendations for follow up therapy are one component of a multi-disciplinary discharge planning process, led by the attending physician.  Recommendations may be updated based on patient status, additional functional criteria and insurance authorization.   Follow Up Recommendations  Home health OT     Assistance Recommended at Discharge Set up Supervision/Assistance  Patient can return home with the following A little help with walking and/or transfers;A little help with bathing/dressing/bathroom;Assistance with cooking/housework;Assist for transportation;Help with stairs or ramp for entrance    Functional Status Assessment  Patient has had a recent decline in their functional status and demonstrates the ability to make significant improvements in function in a  reasonable and predictable amount of time.  Equipment Recommendations  None recommended by OT    Recommendations for Other Services       Precautions / Restrictions Precautions Precautions: Fall Restrictions Weight Bearing Restrictions: No      Mobility Bed Mobility Overal bed mobility: Needs Assistance Bed Mobility: Supine to Sit     Supine to sit: Supervision          Transfers Overall transfer level: Needs assistance Equipment used: Rolling walker (2 wheels) Transfers: Sit to/from Stand Sit to Stand: Min guard           General transfer comment: VC for hand placement, trying to pull up on RW initially      Balance Overall balance assessment: History of Falls, Needs assistance Sitting-balance support: Feet supported Sitting balance-Leahy Scale: Normal     Standing balance support: Bilateral upper extremity supported, Single extremity supported, During functional activity Standing balance-Leahy Scale: Good                             ADL either performed or assessed with clinical judgement   ADL Overall ADL's : Needs assistance/impaired Eating/Feeding: Modified independent;Bed level Eating/Feeding Details (indicate cue type and reason): completing with L hand, able to cut up foods Grooming: Set up;Supervision/safety;Standing;Wash/dry hands           Upper Body Dressing : Set up;Sitting       Toilet Transfer: Regular Toilet;Rolling walker (2 wheels);Ambulation;Grab bars;Min guard Armed forces technical officer Details (indicate cue type and reason): continent void on toilet Toileting- Clothing Manipulation and Hygiene: Set up;Supervision/safety;Sitting/lateral lean Toileting - Clothing Manipulation Details (indicate cue type and reason): for peri care in sitting     Functional mobility during ADLs: Min guard;Rolling walker (2 wheels) (~  21f to the bathroom)       Vision Baseline Vision/History: 1 Wears glasses Patient Visual Report: No change  from baseline Additional Comments: Pt denied blurry vision or diplopia.     Perception     Praxis      Pertinent Vitals/Pain Pain Assessment Pain Assessment: No/denies pain     Hand Dominance     Extremity/Trunk Assessment Upper Extremity Assessment Upper Extremity Assessment: Generalized weakness;RUE deficits/detail (BUE AROM WFL, equal grip strength bilaterally, FTN intact, thumb opposition intact) RUE Deficits / Details: shoulder flexion limited this date 2/2 pain at dressing site on anterior shoulder   Lower Extremity Assessment Lower Extremity Assessment: Defer to PT evaluation       Communication Communication Communication: No difficulties   Cognition Arousal/Alertness: Awake/alert Behavior During Therapy: WFL for tasks assessed/performed Overall Cognitive Status: Within Functional Limits for tasks assessed                                 General Comments: A&Ox4     General Comments       Exercises Other Exercises Other Exercises: OT provided education re: role of OT, OT POC, post acute recs, sitting up for all meals, EOB/OOB mobility with assistance, home/fall safety.     Shoulder Instructions      Home Living Family/patient expects to be discharged to:: Private residence Living Arrangements: Spouse/significant other Available Help at Discharge: Family;Friend(s);Available PRN/intermittently Type of Home: House Home Access: Stairs to enter [employer] Steps: 1 Entrance Stairs-Rails: Left Home Layout: One level     Bathroom Shower/Tub: TTeacher, early years/pre Standard     Home Equipment: Rollator (4 wheels);Rolling Walker (2 wheels);Cane - single point;Grab bars - toilet;Grab bars - tub/shower;Other (comment) (forearm crutch)   Additional Comments: multiple falls recently      Prior Functioning/Environment Prior Level of Function : History of Falls (last six months);Independent/Modified Independent              Mobility Comments: has been using forearm crutch for ambulation, multiple falls recently ADLs Comments: Pt reports Mod I for ADLs. Husband provides assistance fro IADLs recently (driving, grocery shopping).        OT Problem List: Decreased strength;Decreased activity tolerance;Impaired balance (sitting and/or standing)      OT Treatment/Interventions: Self-care/ADL training;Therapeutic exercise;Neuromuscular education;Energy conservation;DME and/or AE instruction;Manual therapy;Modalities;Balance training;Patient/family education;Visual/perceptual remediation/compensation;Cognitive remediation/compensation;Therapeutic activities;Splinting    OT Goals(Current goals can be found in the care plan section) Acute Rehab OT Goals Patient Stated Goal: return home OT Goal Formulation: With patient/family Time For Goal Achievement: 01/23/23 Potential to Achieve Goals: Good   OT Frequency: Min 2X/week    Co-evaluation              AM-PAC OT "6 Clicks" Daily Activity     Outcome Measure Help from another person eating meals?: None Help from another person taking care of personal grooming?: None Help from another person toileting, which includes using toliet, bedpan, or urinal?: A Little Help from another person bathing (including washing, rinsing, drying)?: A Little Help from another person to put on and taking off regular upper body clothing?: None Help from another person to put on and taking off regular lower body clothing?: A Little 6 Click Score: 21   End of Session Equipment Utilized During Treatment: Gait belt;Rolling walker (2 wheels) Nurse Communication: Mobility status  Activity Tolerance: Patient tolerated treatment well Patient left: in bed;with call  bell/phone within reach;with bed alarm set;with family/visitor present  OT Visit Diagnosis: Other abnormalities of gait and mobility (R26.89);History of falling (Z91.81);Muscle weakness (generalized) (M62.81)                 Time: [id number] OT Time Calculation (min): 31 min Charges:  OT General Charges $OT Visit: 1 Visit OT Evaluation $OT Eval Low Complexity: 1 Low  [facility] MS, OTR/L ascom [phone]  01/09/23, 1:00 PM

## 2023-01-09 NOTE — Procedures (Signed)
Patient Name: Autumn Arnold  MRN: [id number]  Epilepsy Attending: Lora Havens  Referring Physician/Provider: Criss Alvine, DO  Date: 01/09/2023 Duration: 32.14 mins  Patient history: 72yo M with falls. EEG to evaluate for seizure  Level of alertness: Awake  AEDs during EEG study: None  Technical aspects: This EEG study was done with scalp electrodes positioned according to the 10-20 International system of electrode placement. Electrical activity was reviewed with band pass filter of 1-'70Hz'$ , sensitivity of 7 uV/mm, display speed of 28m/sec with a '60Hz'$  notched filter applied as appropriate. EEG data were recorded continuously and digitally stored.  Video monitoring was available and reviewed as appropriate.  Description: The posterior dominant rhythm consists of 8 Hz activity of moderate voltage (25-35 uV) seen predominantly in posterior head regions, symmetric and reactive to eye opening and eye closing. Physiologic photic driving was not seen during photic stimulation.  Hyperventilation was not performed.     IMPRESSION: This study is within normal limits. No seizures or epileptiform discharges were seen throughout the recording.  A normal interictal EEG does not exclude the diagnosis of epilepsy.  [name] [name]

## 2023-01-09 NOTE — Progress Notes (Signed)
Neurology Progress Note  Subjective: - Hypoglycemic overnight  - Home gabapentin reduced from 600 QID to 400 QID, last dose at midnight  Exam: Vitals:   01/09/23 0535 01/09/23 0818  BP: (!) 108/51 (!) 144/54  Pulse: 86 94  Resp: 17 18  Temp: 98.1 F (36.7 C) 98.7 F (37.1 C)  SpO2: 93% 91%   Gen: In bed, comfortable  Resp: non-labored breathing, no grossly audible wheezing Cardiac: Perfusing extremities well  Abd: soft, nt  Neuro: MS: Awake, alert, somewhat poor attention/concentration.  Able to name glasses but does not name lenses.  Able to follow simple commands.  Able to repeat CN: Pupils equal round reactive to light, visual fields intact, face symmetric, tongue midline Motor: 5/5 throughout Sensory: Intact to light touch throughout DTR: 3+ and symmetric biceps, brachioradialis; 3+ patella brisk on the right than the left  Pertinent Labs: Lab Results  Component Value Date   HGBA1C 7.1 (H) 10/04/2022   Lab Results  Component Value Date   CHOL 149 04/04/2022   HDL 38 (L) 04/04/2022   LDLCALC 64 04/04/2022   TRIG 236 (H) 04/04/2022   CHOLHDL 3.9 04/04/2022   CK improving from 1194 -> 838  CTA 1. No emergent vascular finding. 2. Severe atherosclerosis: *75% narrowing of the proximal right subclavian artery with additional severe stenosis at the right vertebral origin. The right subclavian and right vertebral artery or more faintly enhancing than the left. *Post treatment right ICA with proximal ICA stenosis measuring 60%. *50% narrowing at the proximal left ICA.  ECHO 01/09/23  1. Left ventricular ejection fraction, by estimation, is 60 to 65%. The  left ventricle has normal function. The left ventricle has no regional  wall motion abnormalities. Left ventricular diastolic parameters are  consistent with Grade I diastolic  dysfunction (impaired relaxation).   2. Right ventricular systolic function is normal. The right ventricular  size is normal.   3. The  mitral valve is normal in structure. Trivial mitral valve  regurgitation.   4. The aortic valve is normal in structure. Aortic valve regurgitation is  not visualized.   FINDINGS   Left Ventricle: Left ventricular ejection fraction, by estimation, is 60  to 65%. The left ventricle has normal function. The left ventricle has no  regional wall motion abnormalities. The left ventricular internal cavity  size was normal in size. There is   no left ventricular hypertrophy. Left ventricular diastolic parameters  are consistent with Grade I diastolic dysfunction (impaired relaxation).   Right Ventricle: The right ventricular size is normal. No increase in  right ventricular wall thickness. Right ventricular systolic function is  normal.   Left Atrium: Left atrial size was normal in size.   Right Atrium: Right atrial size was normal in size.   Lab Results  Component Value Date   HGBA1C 7.1 (H) 10/04/2022   Lab Results  Component Value Date   CHOL 149 04/04/2022   HDL 38 (L) 04/04/2022   LDLCALC 64 04/04/2022   TRIG 236 (H) 04/04/2022   CHOLHDL 3.9 04/04/2022   EEG: Within normal limits  MRI brain personally reviewed, agree with radiology:   1. No acute intracranial process. No evidence of acute or subacute infarct. 2. Remote right occipital infarct is new from the prior MRI and 04/30/2022 CT head.  Impression: Patient with altered mental status, potentially secondary to hypoglycemia (though was not frankly hypoglycemic until later in her hospital stay), medication use (high-dose of gabapentin), or dementia related fluctuation.  She has  had a new embolic appearing infarct since her last prior head imaging, etiology may be a atherosclerotic disease given her significant atherosclerosis, less likely cardioembolic given her echocardiogram findings.  Appreciate discussion with cardiology regarding prior chart notes of atrial fibrillation.  Note from 11/19/2022 documents "Paroxysmal atrial  fibrillation chronic intermittent recurrent currently on aspirin and Plavix status post CVA continue current therapy currently not on anticoagulation" however on further review Dr. Clayborn Bigness notes that no clear atrial fibrillation has been captured on any EKG or telemetry monitoring.  Therefore anticoagulation is not indicated at this time, which is fortunate given the patient's frequent falls at home  Recommendations:  # Stroke-like symptoms # Right occipital infarction new from June 2023 but remote by appearance - LDL is meeting goal, A1c is near goal at 7.1% - Management of elevated triglycerides per outpatient cardiology/PCP as this can also increase her stroke risk - Risk factor modification, patient reports she would like to stop smoking but does not think she can and patches/gum will not work.  Her husband also smokes in the home - Blood pressure goal: normotension - PT consult, OT consult, Speech consult, unless patient is back to baseline   # Dementia / cognitive impairment - B12 596, RPR and HIV neg, TSH normal - Pending: thiamine, B6, B3, to be followed up by primary team / PCP, please supplement if found to be deficient - Routine EEG within normal limits - Agree with reduction in gabapentin dose, patient is unsure why she takes this and is not sure that it helps with her pain so this may be a good medication to discontinue - Outpatient neurocognitive testing, recommend referral to [facility] clinic per Patient/family preference  Lesleigh Noe MD-PhD [facility] [phone]   Greater than 50 minutes were spent in care of this patient today, the majority at bedside.  Imaging was reviewed with sister, daughter, and patient was discussed with cardiology, vascular surgery, and primary team via combination of secure chat and at bedside

## 2023-01-09 NOTE — Progress Notes (Signed)
Patient was taken over alert and orientated without any complaint. Dangling on bed await ing her daughter to take her home. Daughter arrived and patient wheeled out to dc area by nurse. All belonging accompanied pt.

## 2023-01-09 NOTE — Discharge Summary (Signed)
Physician Discharge Summary   Patient: Autumn Arnold MRN: [id number] DOB: May 26, 1951  Admit date:     01/08/2023  Discharge date: 01/09/23  Discharge Physician: Sharen Hones   PCP: [name], MD   Recommendations at discharge:   Follow-up with PCP in 1 week. Follow-up with neurology in 1 month. Follow-up with vascular surgery in 1 month. Follow-up with cardiology in 1 month for heart monitor, prefer a Linq recorder.  Discharge Diagnoses: Principal Problem:   Stroke-like symptom Active Problems:   Type 2 diabetes mellitus with hyperlipidemia (HCC)   Anxiety and depression   Tobacco dependence   Hyperlipidemia   DNR (do not resuscitate)   COPD (chronic obstructive pulmonary disease) (HCC)   Weakness   PVD (peripheral vascular disease) (HCC)   Elevated CK   Dementia (HCC)   Cognitive decline   Polypharmacy   History of CVA (cerebrovascular accident)   Hypoglycemia TIA Resolved Problems:   * No resolved hospital problems. [facility] Course: Ms. Autumn Arnold is a 72 year old female with history of hyperlipidemia, GERD, non-insulin-dependent diabetes mellitus, depression, anxiety, history of CVA, neuropathy, who presents emergency department for chief concerns of frequent falls, and speech slurring that started at approximately 1700 on day of admission. MRI of the brain did not show acute stroke.  CT angiogram neck and head showed right subclavian occlusion with a severe right vertebral artery occlusion.  Right LCA posttreatment still has 60% stenosis, left ICA has 50% stenosis. Patient also had hypoglycemia with glucose as low as 44.   Assessment and Plan: * Stroke-like symptom TIA History of CVA (cerebrovascular accident) Test results as above.  Patient has been seen by neurology, consistent with embolic stroke.  However, patient does not have a history of atrial fibrillation.  Last cardiac monitor on he had a 1 hour of useful data.  Will refer patient back to Dr.  Clayborn Bigness with for heart monitoring.  At this point we will continue aspirin, Plavix and statin. Althogh patient was found to have hypoglycemia in the hospital, her symptoms more consistent with TIA. Echo showed EF [id number], grade 1 diastolic dysfunction, trivial mitral regurgigation.   Right lower lobe infiltrates. Chest x-ray has showed right lower lobe infiltrates, patient has a cough, she was seen by speech therapy, no current risk for aspiration.  Procalcitonin level less than 0.1,.  No bacterial pneumonia.   Polypharmacy Follow-up with PCP to adjust the medicine.   Cognitive decline Does show any seizure activity.  Follow-up with neurology and PCP as outpatient.   Elevated CK Mechanical fall. PT/OT will be set up.  CK level has been down below 1000 after IV fluids.   Hyperlipidemia - Atorvastatin 80 mg nightly resumed   Tobacco dependence Advised to quit.   Anxiety and depression Resume home treatment.   Bilateral lower extremity peripheral arterial disease. Follow-up with Dr. Lucky Cowboy in 1 month.      Consultants: Neurology Procedures performed: None  Disposition: Home health Diet recommendation:  Discharge Diet Orders (From admission, onward)     Start     Ordered   01/09/23 0000  Diet - low sodium heart healthy        01/09/23 1649           Cardiac diet DISCHARGE MEDICATION: Allergies as of 01/09/2023       Reactions   Lisinopril Other (See Comments)   Tongue burning sensation        Medication List     STOP taking these medications  glipiZIDE 10 MG tablet Commonly known as: GLUCOTROL   rosuvastatin 40 MG tablet Commonly known as: CRESTOR       TAKE these medications    acetaminophen 500 MG tablet Commonly known as: TYLENOL Take 1,000 mg by mouth every 8 (eight) hours as needed for mild pain.   albuterol 108 (90 Base) MCG/ACT inhaler Commonly known as: VENTOLIN HFA Inhale 2 puffs into the lungs every 4 (four) hours as needed for  wheezing.   alendronate 70 MG tablet Commonly known as: FOSAMAX Take 70 mg by mouth once a week. Take with a full glass of water on an empty stomach.   amitriptyline 150 MG tablet Commonly known as: ELAVIL Take 150 mg by mouth at bedtime.   aspirin EC 81 MG tablet Take 1 tablet (81 mg total) by mouth daily.   atorvastatin 80 MG tablet Commonly known as: LIPITOR Take 80 mg by mouth daily.   busPIRone 5 MG tablet Commonly known as: BUSPAR Take 5 mg by mouth 2 (two) times daily.   Calcium Carb-Cholecalciferol 600-400 MG-UNIT Tabs Take 1 tablet by mouth daily.   clopidogrel 75 MG tablet Commonly known as: PLAVIX Take 1 tablet (75 mg total) by mouth daily.   cyanocobalamin 1000 MCG tablet Commonly known as: VITAMIN B12 Take 1,000 mcg by mouth 3 (three) times a week.   donepezil 5 MG tablet Commonly known as: ARICEPT Take 5 mg by mouth at bedtime.   gabapentin 600 MG tablet Commonly known as: NEURONTIN Take 1,200 mg by mouth 2 (two) times daily. Two tablets twice daily   metFORMIN 1000 MG tablet Commonly known as: GLUCOPHAGE Take 1,000 mg by mouth 2 (two) times daily.   metoprolol tartrate 25 MG tablet Commonly known as: LOPRESSOR Take 0.5 tablets (12.5 mg total) by mouth 2 (two) times daily.   montelukast 10 MG tablet Commonly known as: SINGULAIR Take 10 mg by mouth daily.   multivitamin with minerals Tabs tablet Take 1 tablet by mouth daily.   oxyCODONE-acetaminophen 5-325 MG tablet Commonly known as: PERCOCET/ROXICET Take 1 tablet by mouth every 4 (four) hours as needed for moderate pain.   pantoprazole 40 MG tablet Commonly known as: PROTONIX Take 40 mg by mouth daily.   senna-docusate 8.6-50 MG tablet Commonly known as: Senokot-S Take 2 tablets by mouth at bedtime as needed for mild constipation.   sertraline 50 MG tablet Commonly known as: Zoloft Take 3 tablets (150 mg total) by mouth daily. What changed: additional instructions   tiZANidine 4  MG tablet Commonly known as: ZANAFLEX Take 4 mg by mouth 2 (two) times daily.               Discharge Care Instructions  (From admission, onward)           Start     Ordered   01/09/23 0000  Discharge wound care:       Comments: Follow with pcp in 1 week   01/09/23 1649            Follow-up Information     [name], MD Follow up in 1 week(s).   Specialty: Family Medicine Contact information: [place] [address] [facility] Follow up in 1 month(s).   Contact information: [place] [phone]        Algernon Huxley, MD Follow up in 1 month(s).  Specialties: Vascular Surgery, Radiology, Interventional Cardiology Contact information: [place] [address] [phone]         Yolonda Kida, MD Follow up in 1 month(s).   Specialties: Cardiology, Internal Medicine Contact information: [place] [address] [phone]                Discharge Exam: Danley Danker Weights   01/08/23 1759  Weight: 65.6 kg   General exam: Appears calm and comfortable  Respiratory system: Clear to auscultation. Respiratory effort normal. Cardiovascular system: S1 & S2 heard, RRR. No JVD, murmurs, rubs, gallops or clicks. No pedal edema. Gastrointestinal system: Abdomen is nondistended, soft and nontender. No organomegaly or masses felt. Normal bowel sounds heard. Central nervous system: Alert and oriented. No focal neurological deficits. Extremities: Symmetric 5 x 5 power. Skin: No rashes, lesions or ulcers Psychiatry: Judgement and insight appear normal. Mood & affect appropriate.    Condition at discharge: good  The results of significant diagnostics from this hospitalization (including imaging, microbiology, ancillary and laboratory) are listed below  for reference.   Imaging Studies: US ARTERIAL ABI (SCREENING LOWER EXTREMITY)  Result Date: 01/09/2023 CLINICAL DATA:  Weakness EXAM: NONINVASIVE PHYSIOLOGIC VASCULAR STUDY OF BILATERAL LOWER EXTREMITIES TECHNIQUE: Evaluation of both lower extremities were performed at rest, including calculation of ankle-brachial indices with single level Doppler, pressure and pulse volume recording. COMPARISON:  None Available. FINDINGS: Right ABI: 0.62 Left ABI: 0.64 Right Lower Extremity: Abnormal monophasic arterial waveforms at the ankle. Left Lower Extremity: Abnormal monophasic arterial waveforms at the ankle. 0.5-0.79 Moderate PAD IMPRESSION: Symmetrically abnormal bilateral ankle-brachial indices consistent with at least moderate underlying peripheral arterial disease. Given the symmetry, aortoiliac disease or bilateral femoropopliteal disease is favored. Signed, Criselda Peaches, MD, [facility] Electronically Signed   By: Jacqulynn Cadet M.D.   On: 01/09/2023 16:22   ECHOCARDIOGRAM COMPLETE  Result Date: 01/09/2023    ECHOCARDIOGRAM REPORT   Patient Name:   JAYNELLE KREIFELS Date of Exam: 01/09/2023 Medical Rec #:  [id number]      Height:       64.0 in Accession #:    [id number]     Weight:       144.6 lb Date of Birth:  Jan 02, 1951       BSA:          1.705 m Patient Age:    72 years       BP:           108/51 mmHg Patient Gender: F              HR:           86 bpm. Exam Location:  [facility] Procedure: 2D Echo, Cardiac Doppler and Color Doppler Indications:     TIA G45.9  History:         Patient has prior history of Echocardiogram examinations, most                  recent 04/05/2022. COPD; Risk Factors:Hypertension and Diabetes.  Sonographer:     Sherrie Sport Referring Phys:  [id number] AMY N COX Diagnosing Phys: Yolonda Kida MD  Sonographer Comments: Image quality was good. IMPRESSIONS  1. Left ventricular ejection fraction, by estimation, is 60 to 65%.  The left ventricle has normal function. The left ventricle has no regional wall motion abnormalities. Left ventricular diastolic parameters are consistent with Grade I diastolic dysfunction (impaired relaxation).  2. Right ventricular systolic function is normal. The right ventricular size is normal.  3. The mitral valve is normal in structure. Trivial mitral valve regurgitation.  4. The aortic valve is normal in structure. Aortic valve regurgitation is not visualized. FINDINGS  Left Ventricle: Left ventricular ejection fraction, by estimation, is 60 to 65%. The left ventricle has normal function. The left ventricle has no regional wall motion abnormalities. The left ventricular internal cavity size was normal in size. There is  no left ventricular hypertrophy. Left ventricular diastolic parameters are consistent with Grade I diastolic dysfunction (impaired relaxation). Right Ventricle: The right ventricular size is normal. No increase in right ventricular wall thickness. Right ventricular systolic function is normal. Left Atrium: Left atrial size was normal in size. Right Atrium: Right atrial size was normal in size. Pericardium: There is no evidence of pericardial effusion. Mitral Valve: The mitral valve is normal in structure. Trivial mitral valve regurgitation. Tricuspid Valve: The tricuspid valve is normal in structure. Tricuspid valve regurgitation is trivial. Aortic Valve: The aortic valve is normal in structure. Aortic valve regurgitation is not visualized. Aortic regurgitation PHT measures 412 msec. Aortic valve mean gradient measures 4.0 mmHg. Aortic valve peak gradient measures 6.2 mmHg. Aortic valve area, by VTI measures 3.10 cm. Pulmonic Valve: The pulmonic valve was normal in structure. Pulmonic valve regurgitation is not visualized. Aorta: The ascending aorta was not well visualized. IAS/Shunts: No atrial level shunt detected by color flow Doppler.  LEFT VENTRICLE PLAX 2D LVIDd:         3.80 cm    Diastology LVIDs:         2.60 cm   LV e' medial:    8.92 cm/s LV PW:         1.00 cm   LV E/e' medial:  9.0 LV IVS:        1.10 cm   LV e' lateral:   11.00 cm/s LVOT diam:     2.00 cm   LV E/e' lateral: 7.3 LV SV:         73 LV SV Index:   43 LVOT Area:     3.14 cm  RIGHT VENTRICLE RV Basal diam:  3.20 cm RV Mid diam:    2.50 cm RV S prime:     13.50 cm/s TAPSE (M-mode): 1.6 cm LEFT ATRIUM             Index        RIGHT ATRIUM          Index LA diam:        3.00 cm 1.76 cm/m   RA Area:     8.53 cm LA Vol (A2C):   42.1 ml 24.70 ml/m  RA Volume:   13.80 ml 8.10 ml/m LA Vol (A4C):   27.2 ml 15.96 ml/m LA Biplane Vol: 33.6 ml 19.71 ml/m  AORTIC VALVE AV Area (Vmax):    2.79 cm AV Area (Vmean):   2.61 cm AV Area (VTI):     3.10 cm AV Vmax:           124.00 cm/s AV Vmean:          87.800 cm/s AV VTI:            0.235 m AV Peak Grad:      6.2 mmHg AV Mean Grad:      4.0 mmHg LVOT Vmax:         110.00 cm/s LVOT Vmean:  73.000 cm/s LVOT VTI:          0.232 m LVOT/AV VTI ratio: 0.99 AI PHT:            412 msec  AORTA Ao Root diam: 2.60 cm MITRAL VALVE                TRICUSPID VALVE MV Area (PHT): 6.48 cm     TR Peak grad:   15.4 mmHg MV Decel Time: 117 msec     TR Vmax:        196.00 cm/s MV E velocity: 80.70 cm/s MV A velocity: 121.00 cm/s  SHUNTS MV E/A ratio:  0.67         Systemic VTI:  0.23 m                             Systemic Diam: 2.00 cm Yolonda Kida MD Electronically signed by Yolonda Kida MD Signature Date/Time: 01/09/2023/4:16:06 PM    Final    EEG adult  Result Date: 01/09/2023 Lora Havens, MD     01/09/2023  1:50 PM Patient Name: KYRIANNA PETTITT MRN: [id number] Epilepsy Attending: Lora Havens Referring Physician/Provider: Criss Alvine, DO Date: 01/09/2023 Duration: 32.14 mins Patient history: 72yo M with falls. EEG to evaluate for seizure Level of alertness: Awake AEDs during EEG study: None Technical aspects: This EEG study was done with scalp electrodes positioned according to  the 10-20 International system of electrode placement. Electrical activity was reviewed with band pass filter of 1-'70Hz'$ , sensitivity of 7 uV/mm, display speed of 34m/sec with a '60Hz'$  notched filter applied as appropriate. EEG data were recorded continuously and digitally stored.  Video monitoring was available and reviewed as appropriate. Description: The posterior dominant rhythm consists of 8 Hz activity of moderate voltage (25-35 uV) seen predominantly in posterior head regions, symmetric and reactive to eye opening and eye closing. Physiologic photic driving was not seen during photic stimulation.  Hyperventilation was not performed.   IMPRESSION: This study is within normal limits. No seizures or epileptiform discharges were seen throughout the recording. A normal interictal EEG does not exclude the diagnosis of epilepsy. PLora Havens  MR BRAIN WO CONTRAST  Result Date: 01/09/2023 CLINICAL DATA:  Slurred speech and right-sided deficits, stroke suspected EXAM: MRI HEAD WITHOUT CONTRAST TECHNIQUE: Multiplanar, multiecho pulse sequences of the brain and surrounding structures were obtained without intravenous contrast. COMPARISON:  04/04/2022 MRI head, correlation is also made with 01/08/2023 CT head FINDINGS: Brain: No restricted diffusion to suggest acute or subacute infarct. No acute hemorrhage, mass, mass effect, or midline shift. No hydrocephalus or extra-axial collection. Remote lacunar infarcts in the right thalamus, posterior limb of the left internal capsule, and bilateral corona radiata. Remote right occipital infarct is new from the prior MRI, as well as the 04/30/2022 CT head. Vascular: Normal arterial flow voids. Skull and upper cervical spine: Normal marrow signal. Sinuses/Orbits: Clear paranasal sinuses. No acute finding in the orbits. Status post bilateral lens replacements. Other: Fluid in the right mastoid air cells. IMPRESSION: 1. No acute intracranial process. No evidence of acute or  subacute infarct. 2. Remote right occipital infarct is new from the prior MRI and 04/30/2022 CT head. Electronically Signed   By: AMerilyn BabaM.D.   On: 01/09/2023 00:09   DG Chest 1 View  Result Date: 01/08/2023 CLINICAL DATA:  Altered mental status. EXAM: CHEST  1 VIEW COMPARISON:  Portable chest  04/30/2022 FINDINGS: The cardiomediastinal silhouette and vasculature are normal apart from calcifications in the transverse aorta. There is new hazy opacity in the right infrahilar area which could be due to atelectasis or pneumonitis versus asymmetric overlapping structures. Remaining lungs are clear. No pleural effusion seen. There has been interval stenting of the right common carotid artery. No acute osseous findings. IMPRESSION: 1. New hazy opacity in the right infrahilar area which could be due to atelectasis or pneumonitis versus asymmetric overlapping structures. PA and lateral study in full inspiration is recommended. 2. Interval stenting of the right common carotid artery. Electronically Signed   By: Telford Nab M.D.   On: 01/08/2023 20:56   CT ANGIO HEAD NECK W WO CM  Result Date: 01/08/2023 CLINICAL DATA:  Stroke, determine embolic source. EXAM: CT ANGIOGRAPHY HEAD AND NECK TECHNIQUE: Multidetector CT imaging of the head and neck was performed using the standard protocol during bolus administration of intravenous contrast. Multiplanar CT image reconstructions and MIPs were obtained to evaluate the vascular anatomy. Carotid stenosis measurements (when applicable) are obtained utilizing NASCET criteria, using the distal internal carotid diameter as the denominator. RADIATION DOSE REDUCTION: This exam was performed according to the departmental dose-optimization program which includes automated exposure control, adjustment of the mA and/or kV according to patient size and/or use of iterative reconstruction technique. CONTRAST:  42m OMNIPAQUE IOHEXOL 350 MG/ML SOLN COMPARISON:  Head CT from earlier  today FINDINGS: CTA NECK FINDINGS Aortic arch: Extensive atheromatous plaque with 3 vessel branching. Right carotid system: Severe atheromatous plaque affecting the brachiocephalic and common carotid with common carotid stent. The stent appears widely patent. Proximal ICA stenosis accentuated by vessel kink with narrowing measuring 60 %, suspect this is at an anastomosis from prior carotid endarterectomy. Left carotid system: Extensive atheromatous plaque affecting the common carotid and proximal ICA. Proximal ICA stenosis measures 50% as measured on axial images due to posterior wall plaque. No ulceration. Vertebral arteries: Extensive proximal subclavian atherosclerosis. Severe narrowing at the right subclavian artery just beyond the stent (at least 75%). The right subclavian and right vertebral artery are under enhancing compared to the left. Bulky calcified plaque causes high-grade narrowing of the right vertebral artery. Skeleton: Generalized cervical spine degeneration with mild C4-5 anterolisthesis. Other neck: No acute or aggressive finding. Upper chest: No acute finding Review of the MIP images confirms the above findings CTA HEAD FINDINGS Anterior circulation: Atheromatous calcification of the carotid siphons. No branch occlusion, beading, or aneurysm. Posterior circulation: Atheromatous plaque affects the V4 segments without high-grade stenosis. The basilar is smoothly contoured and widely patent. No branch occlusion, beading, or aneurysm. Fetal type right PCA. Venous sinuses: Unremarkable Anatomic variants: As above Review of the MIP images confirms the above findings IMPRESSION: 1. No emergent vascular finding. 2. Severe atherosclerosis: *75% narrowing of the proximal right subclavian artery with additional severe stenosis at the right vertebral origin. The right subclavian and right vertebral artery or more faintly enhancing than the left. *Post treatment right ICA with proximal ICA stenosis measuring  60%. *50% narrowing at the proximal left ICA. Electronically Signed   By: JJorje GuildM.D.   On: 01/08/2023 20:18   CT HEAD CODE STROKE WO CONTRAST  Result Date: 01/08/2023 CLINICAL DATA:  Code stroke. Slurred speech and worsening right-sided lesion. EXAM: CT HEAD WITHOUT CONTRAST TECHNIQUE: Contiguous axial images were obtained from the base of the skull through the vertex without intravenous contrast. RADIATION DOSE REDUCTION: This exam was performed according to the departmental dose-optimization program which includes  automated exposure control, adjustment of the mA and/or kV according to patient size and/or use of iterative reconstruction technique. COMPARISON:  CT head 04/30/2022 FINDINGS: Brain: There is no acute intracranial hemorrhage, extra-axial fluid collection, or acute infarct. Background parenchymal volume is normal for age. The ventricles are normal in size. There are remote infarcts in the right thalamus and left corona radiata. There is a remote cortical infarct in the right occipital lobe which is new since the prior study from 2023. The pituitary and suprasellar region are normal. There is no mass lesion. There is no mass effect or midline shift. Vascular: No hyperdense vessel is seen. There is calcification of the bilateral carotid siphons and vertebral arteries. Skull: Normal. Negative for fracture or focal lesion. Sinuses/Orbits: The imaged paranasal sinuses are clear. Bilateral lens implants are in place. The globes and orbits are otherwise unremarkable. Other: None. ASPECTS [facility] Stroke Program Early CT Score) - Ganglionic level infarction (caudate, lentiform nuclei, internal capsule, insula, M1-M3 cortex): 7 - Supraganglionic infarction (M4-M6 cortex): 3 Total score (0-10 with 10 being normal): 10 IMPRESSION: 1. No acute intracranial pathology. 2. Remote infarcts in the right thalamus, left corona radiata, and right occipital lobe, new in the right occipital lobe since 2023. These  results were called by telephone at the time of interpretation on 01/08/2023 at 6:12 pm to provider [facility] , who verbally acknowledged these results. Electronically Signed   By: Valetta Mole M.D.   On: 01/08/2023 18:13    Microbiology: Results for orders placed or performed during the hospital encounter of 01/08/23  Resp panel by RT-PCR (RSV, Flu A&B, Covid) Urine, Clean Catch     Status: None   Collection Time: 01/08/23  8:03 PM   Specimen: Urine, Clean Catch; Nasal Swab  Result Value Ref Range Status   SARS Coronavirus 2 by RT PCR NEGATIVE NEGATIVE Final    Comment: (NOTE) SARS-CoV-2 target nucleic acids are NOT DETECTED.  The SARS-CoV-2 RNA is generally detectable in upper respiratory specimens during the acute phase of infection. The lowest concentration of SARS-CoV-2 viral copies this assay can detect is 138 copies/mL. A negative result does not preclude SARS-Cov-2 infection and should not be used as the sole basis for treatment or other patient management decisions. A negative result may occur with  improper specimen collection/handling, submission of specimen other than nasopharyngeal swab, presence of viral mutation(s) within the areas targeted by this assay, and inadequate number of viral copies(<138 copies/mL). A negative result must be combined with clinical observations, patient history, and epidemiological information. The expected result is Negative.  Fact Sheet for Patients:  EntrepreneurPulse.com.au  Fact Sheet for Healthcare Providers:  IncredibleEmployment.be  This test is no t yet approved or cleared by the [place] FDA and  has been authorized for detection and/or diagnosis of SARS-CoV-2 by FDA under an Emergency Use Authorization (EUA). This EUA will remain  in effect (meaning this test can be used) for the duration of the COVID-19 declaration under Section 564(b)(1) of the Act, 21 U.S.C.section 360bbb-3(b)(1), unless  the authorization is terminated  or revoked sooner.       Influenza A by PCR NEGATIVE NEGATIVE Final   Influenza B by PCR NEGATIVE NEGATIVE Final    Comment: (NOTE) The Xpert Xpress SARS-CoV-2/FLU/RSV plus assay is intended as an aid in the diagnosis of influenza from Nasopharyngeal swab specimens and should not be used as a sole basis for treatment. Nasal washings and aspirates are unacceptable for Xpert Xpress SARS-CoV-2/FLU/RSV testing.  Fact  Sheet for Patients: EntrepreneurPulse.com.au  Fact Sheet for Healthcare Providers: IncredibleEmployment.be  This test is not yet approved or cleared by the [place] FDA and has been authorized for detection and/or diagnosis of SARS-CoV-2 by FDA under an Emergency Use Authorization (EUA). This EUA will remain in effect (meaning this test can be used) for the duration of the COVID-19 declaration under Section 564(b)(1) of the Act, 21 U.S.C. section 360bbb-3(b)(1), unless the authorization is terminated or revoked.     Resp Syncytial Virus by PCR NEGATIVE NEGATIVE Final    Comment: (NOTE) Fact Sheet for Patients: EntrepreneurPulse.com.au  Fact Sheet for Healthcare Providers: IncredibleEmployment.be  This test is not yet approved or cleared by the [place] FDA and has been authorized for detection and/or diagnosis of SARS-CoV-2 by FDA under an Emergency Use Authorization (EUA). This EUA will remain in effect (meaning this test can be used) for the duration of the COVID-19 declaration under Section 564(b)(1) of the Act, 21 U.S.C. section 360bbb-3(b)(1), unless the authorization is terminated or revoked.  Performed at [facility], [address] [place], [place] [address]     Labs: CBC: Recent Labs  Lab 01/08/23 1832 01/09/23 0442  WBC 10.1 7.1  NEUTROABS 7.7  --   HGB 11.3* 10.0*  HCT 34.7* 31.6*  MCV 86.8 89.3  PLT 202 0000000    Basic Metabolic Panel: Recent Labs  Lab 01/08/23 1832 01/09/23 0442  NA 138 140  K 3.9 4.3  CL 106 109  CO2 22 24  GLUCOSE 81 105*  BUN 8 7*  CREATININE 0.86 0.79  CALCIUM 9.3 8.4*   Liver Function Tests: Recent Labs  Lab 01/08/23 1832  AST 35  ALT 16  ALKPHOS 85  BILITOT 0.5  PROT 7.0  ALBUMIN 3.5   CBG: Recent Labs  Lab 01/08/23 2235 01/08/23 2325 01/09/23 0151 01/09/23 0815 01/09/23 1139  GLUCAP 88 130* 77 119* 135*    Discharge time spent: greater than 30 minutes.  Signed: Sharen Hones, MD [facility] 01/09/2023

## 2023-01-09 NOTE — Progress Notes (Signed)
Eeg done 

## 2023-01-09 NOTE — Evaluation (Signed)
Clinical/Bedside Swallow Evaluation Patient Details  Name: Autumn Arnold MRN: [id number] Date of Birth: 19-Feb-1951  Today's Date: 01/09/2023 Time: SLP Start Time (ACUTE ONLY): 0810 SLP Stop Time (ACUTE ONLY): 0900 SLP Time Calculation (min) (ACUTE ONLY): 50 min  Past Medical History:  Past Medical History:  Diagnosis Date   Anxiety    Aortic atherosclerosis ([place])    Arthritis    Asthma    Ataxic gait    a.) requires gait aids for assitance   Atherosclerotic PVD with intermittent claudication ([place])    a.) s/p LEFT femoral endartarectomy and LEFT iliac stent 04/11/2022   Bilateral carotid artery disease ([place]) 11/07/2015   a.) doppler 11/07/2015: <50 BICA; b.) doppler 06/23/2021: near occ RICA< < [id number] LICA; c.) doppler [id number]: occluded RICA, mod plaque LICA   Bilateral lower extremity edema    CAD (coronary artery disease)    Chronic cough    COPD (chronic obstructive pulmonary disease) (HCC)    CVA (cerebral vascular accident) ([place]) 11/07/2015   a.) posterior superior LEFT lenticular nucleus to posterior LEFT corona radiata   DDD (degenerative disc disease), cervical    a.) s/p C5-C6 fusion   DOE (dyspnea on exertion)    GERD (gastroesophageal reflux disease)    History of bilateral cataract extraction 2017   History of left cataract extraction    Hyperlipidemia    Hypertension    Ischemic cerebrovascular accident (CVA) ([place]) 06/23/2021   a.) superior LEFT frontal and parietal ischemia   Ischemic cerebrovascular accident (CVA) ([place]) 04/04/2022   a.) subcentimeter cortical/subcortical aspect of RIGHT frontoparietal region --> acute to early subacte ischemic infarct   Long term current use of antithrombotics/antiplatelets    a.) DAPT (ASA + clopidogrel)   Lower extremity weakness    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[place])    a.) s/p PTA and stenting 06/23/2022 --> SMA, mid-infrarenal, BILATERAL CIA; b.) s/p PTA and stenting 09/25/2022: SMA, BILATERAL CIA, infrarenal aorta, LEFT CFA    OSA on CPAP    PAF (paroxysmal atrial fibrillation) ([place])    Right-sided lacunar infarction ([place]) 02/09/2010   a.) non-hemorrhagic   Small fiber neuropathy    T2DM (type 2 diabetes mellitus) ([place])    Vitamin D deficiency    Wheezing    Past Surgical History:  Past Surgical History:  Procedure Laterality Date   ABDOMINAL HYSTERECTOMY     ANTERIOR FUSION CERVICAL SPINE     CARPAL TUNNEL RELEASE Bilateral    CATARACT EXTRACTION W/PHACO Left 03/12/2016   Procedure: CATARACT EXTRACTION PHACO AND INTRAOCULAR LENS PLACEMENT ([place]);  Surgeon: Estill Cotta, MD;  Location: [facility] ORS;  Service: Ophthalmology;  Laterality: Left;  [place] 01:12 AP% 23.4 CDE 29.83 fluid pack lot # [id number]   CATARACT EXTRACTION W/PHACO Right 04/16/2016   Procedure: CATARACT EXTRACTION PHACO AND INTRAOCULAR LENS PLACEMENT (IOC);  Surgeon: Estill Cotta, MD;  Location: [facility];  Service: Ophthalmology;  Laterality: Right;  [place] 01:17 AP% 22.5 CDE 35.08 Fluid pack lot # [id number]   CESAREAN SECTION     COLONOSCOPY WITH PROPOFOL N/A 08/12/2018   Procedure: COLONOSCOPY WITH PROPOFOL;  Surgeon: Toledo, Benay Pike, MD;  Location: [facility]OS[facility];  Service: Gastroenterology;  Laterality: N/A;   ELBOW SURGERY     Tendonitis   ENDARTERECTOMY Right 10/04/2022   Procedure: ENDARTERECTOMY CAROTID;  Surgeon: Algernon Huxley, MD;  Location: [facility] ORS;  Service: Vascular;  Laterality: Right;   ENDARTERECTOMY FEMORAL Left 04/11/2022   Procedure: ENDARTERECTOMY FEMORAL;  Surgeon: Algernon Huxley, MD;  Location: [facility]  ORS;  Service: Vascular;  Laterality: Left;   ESOPHAGOGASTRODUODENOSCOPY (EGD) WITH PROPOFOL N/A 08/12/2018   Procedure: ESOPHAGOGASTRODUODENOSCOPY (EGD) WITH PROPOFOL;  Surgeon: Toledo, Benay Pike, MD;  Location: [facility] ENDOSCOPY;  Service: Gastroenterology;  Laterality: N/A;   INCONTINENCE SURGERY     INSERTION OF ILIAC STENT Left 04/11/2022   Procedure: INSERTION OF ILIAC STENT;  Surgeon: Algernon Huxley, MD;  Location:  [facility] ORS;  Service: Vascular;  Laterality: Left;   INSERTION OF RETROGRADE CAROTID STENT Right 10/04/2022   Procedure: INSERTION OF RETROGRADE CAROTID STENT;  Surgeon: Algernon Huxley, MD;  Location: [facility] ORS;  Service: Vascular;  Laterality: Right;   LOWER EXTREMITY ANGIOGRAPHY Left 06/23/2021   Procedure: Lower Extremity Angiography;  Surgeon: Katha Cabal, MD;  Location: [facility];  Service: Cardiovascular;  Laterality: Left;   LOWER EXTREMITY ANGIOGRAPHY Left 04/05/2022   Procedure: Lower Extremity Angiography;  Surgeon: Algernon Huxley, MD;  Location: [facility];  Service: Cardiovascular;  Laterality: Left;   LOWER EXTREMITY ANGIOGRAPHY Left 06/25/2022   Procedure: Lower Extremity Angiography;  Surgeon: Algernon Huxley, MD;  Location: [place];  Service: Cardiovascular;  Laterality: Left;   LOWER EXTREMITY ANGIOGRAPHY Left 07/11/2022   Procedure: Lower Extremity Angiography;  Surgeon: Algernon Huxley, MD;  Location: [facility];  Service: Cardiovascular;  Laterality: Left;   TONSILLECTOMY     HPI:  Pt is a 72 year old female with history of Cognitive decline per chart, polypharmacy - per outpatient clinic, current medication: Patient takes gabapentin 600 mg 4 times daily, hyperlipidemia, GERD, tobacco use, non-insulin-dependent diabetes mellitus, ETOH use in past, depression, anxiety, history of CVA, neuropathy, who presents emergency department for chief concerns of frequent falls, and speech slurring that started at approximately 1700 on day of admission. She does have mild word finding at baseline which Daughter states is her baseline. Pt has also c/o vision changes.   MRI this admit: No acute intracranial process. No evidence of acute or subacute  infarct.  2. Remote right occipital infarct is new from the prior MRI. CXR: hazy opacity in the right infrahilar area which could be due to atelectasis or pneumonitis versus asymmetric overlapping structures.     Assessment / Plan / Recommendation  Clinical Impression   Pt seen for BSE today. pt awake, verbal and followed all instructions. Pt eager to [place]. MD ordering her diet currently. Pt has a baseline of apparently mild Cognitive decline. She stated she eats a regular diet at home w/out any difficulty. Of note, pt exhibited mild jerky motor movements and reduced grip w/ finger foods; functional but noticeable.  On RA, afebrile. WBC WNL.  Pt appears to present w/ functional oropharyngeal phase swallowing w/ No oropharyngeal phase dysphagia noted, No neuromuscular deficits noted w/ trials given. Pt consumed po trials w/ No immediate, overt clinical s/s of aspiration during po trials.  Pt appears at reduced risk for aspiration following general aspiration precautions and w/ setup at meals.  However, pt does have challenging factors that could impact oropharyngeal swallowing to include discomfort(on gabapentin), GERD, fatigue/weakness, and hospitalization. Also noted min difficulty w/ UE jerky movements and pincer grasp control during self-feeding. These factors can increase risk for aspiration, dysphagia as well as decreased oral intake overall.   During po trials, pt consumed all consistencies w/ no overt coughing, decline in vocal quality, or change in respiratory presentation during/post trials. O2 sats remained 97%. Oral phase appeared grossly [employer] w/ timely bolus management, mastication, and control  of bolus propulsion for A-P transfer for swallowing. Oral clearing achieved w/ all trial consistencies -- moistened, soft foods given.  OM Exam appeared [facility] w/ no unilateral weakness noted. Speech Clear. Pt fed self w/ setup support.  Recommend continue a fairly Regular consistency diet w/ well-Cut meats/foods, moistened foods; Thin liquids -- monitor straw use, and reduce distractions during meals/eating/drinking. Recommend general aspiration precautions, support w/ tray setup and positioning for meals  -- out of bed best for eating meals. Pills WHOLE in Puree for safer, easier swallowing IF any difficulty noted w/ liquids.  Education given on Pills in Puree; food consistencies and easy to eat options; general aspiration precautions to pt. NSG updated and will reconsult if any new needs arise. NSG agreed. MD updated and agreed w/ above. Recommend Dietician f/u for support. SLP Visit Diagnosis: Dysphagia, unspecified (R13.10)    Aspiration Risk   (reduced following general aspiration precautions)    Diet Recommendation   a fairly Regular consistency diet w/ well-Cut meats/foods, moistened foods; Thin liquids -- monitor straw use, and reduce distractions during meals/eating/drinking. Recommend general aspiration precautions, support w/ tray setup and positioning for meals -- out of bed best for eating meals.  Medication Administration: Whole meds with puree (IF any difficulty swallowing w/ liquids)    Other  Recommendations Recommended Consults:  (Dietician support) Oral Care Recommendations: Oral care BID;Oral care before and after PO;Patient independent with oral care (setup support)    Recommendations for follow up therapy are one component of a multi-disciplinary discharge planning process, led by the attending physician.  Recommendations may be updated based on patient status, additional functional criteria and insurance authorization.  Follow up Recommendations No SLP follow up      Assistance Recommended at Discharge  intermittent  Functional Status Assessment Patient has had a recent decline in their functional status and demonstrates the ability to make significant improvements in function in a reasonable and predictable amount of time.  Frequency and Duration  (n/a)   (n/a)       Prognosis Prognosis for improved oropharyngeal function: Fair (-Good) Barriers to Reach Goals: Time post onset;Severity of deficits      Swallow Study   General Date of Onset: 01/08/23 HPI: Pt is a  72 year old female with history of Cognitive decline per chart, polypharmacy - per outpatient clinic, current medication: Patient takes gabapentin 600 mg 4 times daily, hyperlipidemia, GERD, tobacco use, non-insulin-dependent diabetes mellitus, ETOH use in past, depression, anxiety, history of CVA, neuropathy, who presents emergency department for chief concerns of frequent falls, and speech slurring that started at approximately 1700 on day of admission. She does have mild word finding at baseline which Daughter states is her baseline. Pt has also c/o vision changes.   MRI this admit: No acute intracranial process. No evidence of acute or subacute  infarct.  2. Remote right occipital infarct is new from the prior MRI. Type of Study: Bedside Swallow Evaluation Previous Swallow Assessment: mbss in 2019- no deficits; regular diet then Diet Prior to this Study: Regular;Thin liquids (Level 0) Temperature Spikes Noted: No (wbc 7.1) Respiratory Status: Room air History of Recent Intubation: No Behavior/Cognition: Alert;Cooperative;Pleasant mood;Distractible;Requires cueing Oral Cavity Assessment: Within Functional Limits Oral Care Completed by SLP: Yes Oral Cavity - Dentition: Missing dentition Vision: Functional for self-feeding Self-Feeding Abilities: Able to feed self;Needs set up Patient Positioning: Upright in bed (supported positioning) Baseline Vocal Quality: Normal (min gravely - has reported "voice change" in 2019) Volitional Cough: Strong Volitional Swallow: Able to elicit  Oral/Motor/Sensory Function Overall Oral Motor/Sensory Function: Within functional limits   Ice Chips Ice chips: Within functional limits Presentation: Spoon (fed; 2 trials)   Thin Liquid Thin Liquid: Within functional limits Presentation: Cup;Self Fed;Straw (~8 ozs total)    Nectar Thick Nectar Thick Liquid: Not tested   Honey Thick Honey Thick Liquid: Not tested   Puree Puree: Within functional  limits Presentation: Self Fed;Spoon (10+ trials)   Solid     Solid: Within functional limits Presentation: Self Fed (7-8 trials) Other Comments: moistened         Orinda Kenner, MS, CCC-SLP Speech Language Pathologist Rehab Services; [place] [phone] (ascom) Sol Odor 01/09/2023,4:26 PM

## 2023-01-12 LAB — VITAMIN B6: Vitamin B6: 3.2 ug/L — ABNORMAL LOW (ref 3.4–65.2)

## 2023-01-13 ENCOUNTER — Encounter: Payer: Self-pay | Admitting: Internal Medicine

## 2023-01-13 LAB — VITAMIN B1: Vitamin B1 (Thiamine): 115.7 nmol/L (ref 66.5–200.0)

## 2023-01-13 NOTE — Treatment Plan (Signed)
I received lab value that patient had B6 results that were abnormal.  Patient's B6 levels were mildly low at 3.2 (normal range is 3.4 - 65.2).  - I forwarded the lab level to patient's PCP Dr. Netty Starring - I called patient at her home and asked to speak with her at [phone].  I was able to speak with Ms. Autumn Arnold over the phone and recommended that she obtain over-the-counter vitamin B multivitamins specifically vitamin B6 and take the vitamin B6 vitamin daily as directed - I then asked Ms. Autumn Arnold to repeat to me the instructions and she was able to repeat that she had B6 deficiency and that she was to take a B6 vitamin daily  Dr. Tobie Poet

## 2023-01-18 LAB — MISC LABCORP TEST (SEND OUT): Labcorp test code: 70115

## 2023-03-05 ENCOUNTER — Emergency Department: Payer: Medicare PPO

## 2023-03-05 ENCOUNTER — Emergency Department
Admission: EM | Admit: 2023-03-05 | Discharge: 2023-03-05 | Discharge status: Against Medical Advice | Payer: Medicare PPO | Source: Home / Self Care

## 2023-03-05 ENCOUNTER — Other Ambulatory Visit: Payer: Self-pay

## 2023-03-05 DIAGNOSIS — Z859 Personal history of malignant neoplasm, unspecified: Secondary | ICD-10-CM | POA: Insufficient documentation

## 2023-03-05 DIAGNOSIS — I739 Peripheral vascular disease, unspecified: Secondary | ICD-10-CM | POA: Diagnosis not present

## 2023-03-05 DIAGNOSIS — R079 Chest pain, unspecified: Secondary | ICD-10-CM | POA: Diagnosis not present

## 2023-03-05 DIAGNOSIS — R0602 Shortness of breath: Secondary | ICD-10-CM | POA: Insufficient documentation

## 2023-03-05 DIAGNOSIS — M79662 Pain in left lower leg: Secondary | ICD-10-CM | POA: Diagnosis not present

## 2023-03-05 DIAGNOSIS — M79605 Pain in left leg: Secondary | ICD-10-CM | POA: Insufficient documentation

## 2023-03-05 DIAGNOSIS — R000 Tachycardia, unspecified: Secondary | ICD-10-CM | POA: Diagnosis not present

## 2023-03-05 DIAGNOSIS — I70202 Unspecified atherosclerosis of native arteries of extremities, left leg: Secondary | ICD-10-CM | POA: Diagnosis not present

## 2023-03-05 DIAGNOSIS — Z5321 Procedure and treatment not carried out due to patient leaving prior to being seen by health care provider: Secondary | ICD-10-CM | POA: Insufficient documentation

## 2023-03-05 DIAGNOSIS — E1151 Type 2 diabetes mellitus with diabetic peripheral angiopathy without gangrene: Secondary | ICD-10-CM | POA: Diagnosis not present

## 2023-03-05 LAB — CBC
HCT: 39.2 % (ref 36.0–46.0)
Hemoglobin: 12.6 g/dL (ref 12.0–15.0)
MCH: 28.4 pg (ref 26.0–34.0)
MCHC: 32.1 g/dL (ref 30.0–36.0)
MCV: 88.3 fL (ref 80.0–100.0)
Platelets: 207 10*3/uL (ref 150–400)
RBC: 4.44 MIL/uL (ref 3.87–5.11)
RDW: 16.9 % — ABNORMAL HIGH (ref 11.5–15.5)
WBC: 13.2 10*3/uL — ABNORMAL HIGH (ref 4.0–10.5)
nRBC: 0 % (ref 0.0–0.2)

## 2023-03-05 LAB — BASIC METABOLIC PANEL
Anion gap: 14 (ref 5–15)
BUN: 12 mg/dL (ref 8–23)
CO2: 23 mmol/L (ref 22–32)
Calcium: 9.4 mg/dL (ref 8.9–10.3)
Chloride: 101 mmol/L (ref 98–111)
Creatinine, Ser: 0.84 mg/dL (ref 0.44–1.00)
GFR, Estimated: >60 mL/min (ref >60)
Glucose, Bld: 191 mg/dL — ABNORMAL HIGH (ref 70–99)
Potassium: 3.7 mmol/L (ref 3.5–5.1)
Sodium: 138 mmol/L (ref 135–145)

## 2023-03-05 NOTE — ED Triage Notes (Signed)
Pt comes with c/o left leg pain that started today. Pt tachy in triage. Pt did also have procedure 3 weeks ago where cancer was and they removed it.   Pt states some sob.

## 2023-03-06 ENCOUNTER — Other Ambulatory Visit: Payer: Self-pay

## 2023-03-06 ENCOUNTER — Inpatient Hospital Stay
Admission: EM | Admit: 2023-03-06 | Discharge: 2023-03-12 | DRG: 253 | Disposition: A | Discharge status: Hospice | Payer: Medicare PPO | Attending: Internal Medicine | Admitting: Internal Medicine

## 2023-03-06 ENCOUNTER — Encounter: Payer: Self-pay | Admitting: Internal Medicine

## 2023-03-06 ENCOUNTER — Emergency Department: Payer: Medicare PPO

## 2023-03-06 ENCOUNTER — Telehealth (INDEPENDENT_AMBULATORY_CARE_PROVIDER_SITE_OTHER): Payer: Self-pay | Admitting: Vascular Surgery

## 2023-03-06 DIAGNOSIS — E1151 Type 2 diabetes mellitus with diabetic peripheral angiopathy without gangrene: Principal | ICD-10-CM | POA: Diagnosis present

## 2023-03-06 DIAGNOSIS — I701 Atherosclerosis of renal artery: Secondary | ICD-10-CM | POA: Diagnosis not present

## 2023-03-06 DIAGNOSIS — Z66 Do not resuscitate: Secondary | ICD-10-CM | POA: Diagnosis present

## 2023-03-06 DIAGNOSIS — I2510 Atherosclerotic heart disease of native coronary artery without angina pectoris: Secondary | ICD-10-CM | POA: Diagnosis present

## 2023-03-06 DIAGNOSIS — I70223 Atherosclerosis of native arteries of extremities with rest pain, bilateral legs: Secondary | ICD-10-CM | POA: Diagnosis not present

## 2023-03-06 DIAGNOSIS — G4733 Obstructive sleep apnea (adult) (pediatric): Secondary | ICD-10-CM | POA: Diagnosis present

## 2023-03-06 DIAGNOSIS — Z79899 Other long term (current) drug therapy: Secondary | ICD-10-CM

## 2023-03-06 DIAGNOSIS — I70202 Unspecified atherosclerosis of native arteries of extremities, left leg: Secondary | ICD-10-CM | POA: Diagnosis not present

## 2023-03-06 DIAGNOSIS — Z7902 Long term (current) use of antithrombotics/antiplatelets: Secondary | ICD-10-CM

## 2023-03-06 DIAGNOSIS — D62 Acute posthemorrhagic anemia: Secondary | ICD-10-CM | POA: Diagnosis not present

## 2023-03-06 DIAGNOSIS — Z7983 Long term (current) use of bisphosphonates: Secondary | ICD-10-CM

## 2023-03-06 DIAGNOSIS — I480 Paroxysmal atrial fibrillation: Secondary | ICD-10-CM | POA: Diagnosis not present

## 2023-03-06 DIAGNOSIS — Z9842 Cataract extraction status, left eye: Secondary | ICD-10-CM

## 2023-03-06 DIAGNOSIS — E785 Hyperlipidemia, unspecified: Secondary | ICD-10-CM | POA: Diagnosis present

## 2023-03-06 DIAGNOSIS — M79662 Pain in left lower leg: Secondary | ICD-10-CM | POA: Diagnosis not present

## 2023-03-06 DIAGNOSIS — K219 Gastro-esophageal reflux disease without esophagitis: Secondary | ICD-10-CM | POA: Diagnosis present

## 2023-03-06 DIAGNOSIS — F32A Depression, unspecified: Secondary | ICD-10-CM | POA: Diagnosis present

## 2023-03-06 DIAGNOSIS — M549 Dorsalgia, unspecified: Secondary | ICD-10-CM | POA: Diagnosis not present

## 2023-03-06 DIAGNOSIS — E1169 Type 2 diabetes mellitus with other specified complication: Secondary | ICD-10-CM | POA: Diagnosis not present

## 2023-03-06 DIAGNOSIS — Z8249 Family history of ischemic heart disease and other diseases of the circulatory system: Secondary | ICD-10-CM | POA: Diagnosis not present

## 2023-03-06 DIAGNOSIS — J449 Chronic obstructive pulmonary disease, unspecified: Secondary | ICD-10-CM | POA: Diagnosis not present

## 2023-03-06 DIAGNOSIS — I700 Atherosclerosis of aorta: Secondary | ICD-10-CM | POA: Diagnosis not present

## 2023-03-06 DIAGNOSIS — Z7982 Long term (current) use of aspirin: Secondary | ICD-10-CM | POA: Diagnosis not present

## 2023-03-06 DIAGNOSIS — J4489 Other specified chronic obstructive pulmonary disease: Secondary | ICD-10-CM | POA: Diagnosis not present

## 2023-03-06 DIAGNOSIS — F419 Anxiety disorder, unspecified: Secondary | ICD-10-CM | POA: Diagnosis present

## 2023-03-06 DIAGNOSIS — J9611 Chronic respiratory failure with hypoxia: Secondary | ICD-10-CM | POA: Diagnosis not present

## 2023-03-06 DIAGNOSIS — F17210 Nicotine dependence, cigarettes, uncomplicated: Secondary | ICD-10-CM | POA: Diagnosis not present

## 2023-03-06 DIAGNOSIS — Z515 Encounter for palliative care: Secondary | ICD-10-CM | POA: Diagnosis not present

## 2023-03-06 DIAGNOSIS — I70209 Unspecified atherosclerosis of native arteries of extremities, unspecified extremity: Secondary | ICD-10-CM | POA: Diagnosis not present

## 2023-03-06 DIAGNOSIS — I739 Peripheral vascular disease, unspecified: Secondary | ICD-10-CM | POA: Diagnosis not present

## 2023-03-06 DIAGNOSIS — Z9841 Cataract extraction status, right eye: Secondary | ICD-10-CM

## 2023-03-06 DIAGNOSIS — R0902 Hypoxemia: Secondary | ICD-10-CM | POA: Diagnosis present

## 2023-03-06 DIAGNOSIS — F0390 Unspecified dementia without behavioral disturbance: Secondary | ICD-10-CM | POA: Diagnosis present

## 2023-03-06 DIAGNOSIS — Z7984 Long term (current) use of oral hypoglycemic drugs: Secondary | ICD-10-CM

## 2023-03-06 DIAGNOSIS — E663 Overweight: Secondary | ICD-10-CM | POA: Diagnosis present

## 2023-03-06 DIAGNOSIS — G4734 Idiopathic sleep related nonobstructive alveolar hypoventilation: Secondary | ICD-10-CM | POA: Diagnosis not present

## 2023-03-06 DIAGNOSIS — Z9889 Other specified postprocedural states: Secondary | ICD-10-CM | POA: Diagnosis not present

## 2023-03-06 DIAGNOSIS — G8929 Other chronic pain: Secondary | ICD-10-CM | POA: Diagnosis present

## 2023-03-06 DIAGNOSIS — Z85828 Personal history of other malignant neoplasm of skin: Secondary | ICD-10-CM

## 2023-03-06 DIAGNOSIS — Z981 Arthrodesis status: Secondary | ICD-10-CM

## 2023-03-06 DIAGNOSIS — L97929 Non-pressure chronic ulcer of unspecified part of left lower leg with unspecified severity: Secondary | ICD-10-CM | POA: Diagnosis present

## 2023-03-06 DIAGNOSIS — Z833 Family history of diabetes mellitus: Secondary | ICD-10-CM

## 2023-03-06 DIAGNOSIS — R000 Tachycardia, unspecified: Secondary | ICD-10-CM | POA: Diagnosis not present

## 2023-03-06 DIAGNOSIS — I70248 Atherosclerosis of native arteries of left leg with ulceration of other part of lower left leg: Secondary | ICD-10-CM | POA: Insufficient documentation

## 2023-03-06 DIAGNOSIS — E871 Hypo-osmolality and hyponatremia: Secondary | ICD-10-CM | POA: Diagnosis present

## 2023-03-06 DIAGNOSIS — G301 Alzheimer's disease with late onset: Secondary | ICD-10-CM | POA: Diagnosis not present

## 2023-03-06 DIAGNOSIS — Z8673 Personal history of transient ischemic attack (TIA), and cerebral infarction without residual deficits: Secondary | ICD-10-CM

## 2023-03-06 DIAGNOSIS — R079 Chest pain, unspecified: Secondary | ICD-10-CM | POA: Diagnosis not present

## 2023-03-06 DIAGNOSIS — E872 Acidosis, unspecified: Secondary | ICD-10-CM | POA: Diagnosis present

## 2023-03-06 DIAGNOSIS — Z6829 Body mass index (BMI) 29.0-29.9, adult: Secondary | ICD-10-CM

## 2023-03-06 DIAGNOSIS — T82856A Stenosis of peripheral vascular stent, initial encounter: Secondary | ICD-10-CM | POA: Diagnosis not present

## 2023-03-06 DIAGNOSIS — Z720 Tobacco use: Secondary | ICD-10-CM | POA: Diagnosis present

## 2023-03-06 DIAGNOSIS — M545 Low back pain, unspecified: Secondary | ICD-10-CM | POA: Diagnosis not present

## 2023-03-06 DIAGNOSIS — I10 Essential (primary) hypertension: Secondary | ICD-10-CM | POA: Diagnosis not present

## 2023-03-06 DIAGNOSIS — Z888 Allergy status to other drugs, medicaments and biological substances status: Secondary | ICD-10-CM

## 2023-03-06 DIAGNOSIS — Z01810 Encounter for preprocedural cardiovascular examination: Secondary | ICD-10-CM | POA: Diagnosis not present

## 2023-03-06 DIAGNOSIS — F0282 Dementia in other diseases classified elsewhere, unspecified severity, with psychotic disturbance: Secondary | ICD-10-CM | POA: Diagnosis not present

## 2023-03-06 DIAGNOSIS — Z7401 Bed confinement status: Secondary | ICD-10-CM | POA: Diagnosis not present

## 2023-03-06 DIAGNOSIS — D696 Thrombocytopenia, unspecified: Secondary | ICD-10-CM | POA: Diagnosis present

## 2023-03-06 LAB — CBC WITH DIFFERENTIAL/PLATELET
Abs Immature Granulocytes: 0.03 10*3/uL (ref 0.00–0.07)
Basophils Absolute: 0 10*3/uL (ref 0.0–0.1)
Basophils Relative: 0 %
Eosinophils Absolute: 0 10*3/uL (ref 0.0–0.5)
Eosinophils Relative: 0 %
HCT: 38.2 % (ref 36.0–46.0)
Hemoglobin: 12.6 g/dL (ref 12.0–15.0)
Immature Granulocytes: 0 %
Lymphocytes Relative: 16 %
Lymphs Abs: 1.8 10*3/uL (ref 0.7–4.0)
MCH: 28.4 pg (ref 26.0–34.0)
MCHC: 33 g/dL (ref 30.0–36.0)
MCV: 86 fL (ref 80.0–100.0)
Monocytes Absolute: 0.7 10*3/uL (ref 0.1–1.0)
Monocytes Relative: 7 %
Neutro Abs: 8.6 10*3/uL — ABNORMAL HIGH (ref 1.7–7.7)
Neutrophils Relative %: 77 %
Platelets: 188 10*3/uL (ref 150–400)
RBC: 4.44 MIL/uL (ref 3.87–5.11)
RDW: 16.6 % — ABNORMAL HIGH (ref 11.5–15.5)
WBC: 11.1 10*3/uL — ABNORMAL HIGH (ref 4.0–10.5)
nRBC: 0 % (ref 0.0–0.2)

## 2023-03-06 LAB — URINALYSIS, ROUTINE W REFLEX MICROSCOPIC
Bacteria, UA: NONE SEEN
Bilirubin Urine: NEGATIVE
Glucose, UA: 50 mg/dL — AB
Ketones, ur: 5 mg/dL — AB
Leukocytes,Ua: NEGATIVE
Nitrite: NEGATIVE
Protein, ur: 30 mg/dL — AB
Specific Gravity, Urine: 1.013 (ref 1.005–1.030)
pH: 6 (ref 5.0–8.0)

## 2023-03-06 LAB — COMPREHENSIVE METABOLIC PANEL
ALT: 17 U/L (ref 0–44)
AST: 49 U/L — ABNORMAL HIGH (ref 15–41)
Albumin: 3.9 g/dL (ref 3.5–5.0)
Alkaline Phosphatase: 88 U/L (ref 38–126)
Anion gap: 13 (ref 5–15)
BUN: 12 mg/dL (ref 8–23)
CO2: 23 mmol/L (ref 22–32)
Calcium: 9 mg/dL (ref 8.9–10.3)
Chloride: 98 mmol/L (ref 98–111)
Creatinine, Ser: 0.73 mg/dL (ref 0.44–1.00)
GFR, Estimated: >60 mL/min (ref >60)
Glucose, Bld: 181 mg/dL — ABNORMAL HIGH (ref 70–99)
Potassium: 3.8 mmol/L (ref 3.5–5.1)
Sodium: 134 mmol/L — ABNORMAL LOW (ref 135–145)
Total Bilirubin: 1.2 mg/dL (ref 0.3–1.2)
Total Protein: 7.4 g/dL (ref 6.5–8.1)

## 2023-03-06 LAB — PROTIME-INR
INR: 1.1 (ref 0.8–1.2)
Prothrombin Time: 13.7 seconds (ref 11.4–15.2)

## 2023-03-06 LAB — APTT: aPTT: 25 seconds (ref 24–36)

## 2023-03-06 MED ORDER — CLOPIDOGREL BISULFATE 75 MG PO TABS
75.0000 mg | ORAL_TABLET | Freq: Every day | ORAL | Status: DC
  Administered 2023-03-06 – 2023-03-09 (×3): 75 mg via ORAL
  Filled 2023-03-06 (×4): qty 1

## 2023-03-06 MED ORDER — SODIUM CHLORIDE 0.9 % IV SOLN: INTRAVENOUS | Status: DC

## 2023-03-06 MED ORDER — METOPROLOL TARTRATE 25 MG PO TABS
12.5000 mg | ORAL_TABLET | Freq: Two times a day (BID) | ORAL | Status: DC
  Administered 2023-03-06 – 2023-03-08 (×4): 12.5 mg via ORAL
  Filled 2023-03-06 (×4): qty 1

## 2023-03-06 MED ORDER — TIZANIDINE HCL 4 MG PO TABS
4.0000 mg | ORAL_TABLET | Freq: Two times a day (BID) | ORAL | Status: DC
  Administered 2023-03-06 – 2023-03-11 (×10): 4 mg via ORAL
  Filled 2023-03-06 (×12): qty 1

## 2023-03-06 MED ORDER — IPRATROPIUM-ALBUTEROL 0.5-2.5 (3) MG/3ML IN SOLN: 3.0000 mL | RESPIRATORY_TRACT | Status: DC | PRN

## 2023-03-06 MED ORDER — CHLORHEXIDINE GLUCONATE 4 % EX SOLN
60.0000 mL | Freq: Once | CUTANEOUS | Status: AC
  Administered 2023-03-06: 4 via TOPICAL

## 2023-03-06 MED ORDER — ACETAMINOPHEN 500 MG PO TABS
1000.0000 mg | ORAL_TABLET | Freq: Three times a day (TID) | ORAL | Status: DC | PRN
  Administered 2023-03-06 – 2023-03-10 (×2): 1000 mg via ORAL
  Filled 2023-03-06 (×2): qty 2

## 2023-03-06 MED ORDER — SERTRALINE HCL 50 MG PO TABS
150.0000 mg | ORAL_TABLET | Freq: Every day | ORAL | Status: DC
  Administered 2023-03-06 – 2023-03-11 (×5): 150 mg via ORAL
  Filled 2023-03-06 (×5): qty 3

## 2023-03-06 MED ORDER — CHLORHEXIDINE GLUCONATE 4 % EX SOLN: 60.0000 mL | Freq: Once | CUTANEOUS | Status: DC

## 2023-03-06 MED ORDER — OYSTER SHELL CALCIUM/D3 500-5 MG-MCG PO TABS
1.0000 | ORAL_TABLET | Freq: Every day | ORAL | Status: DC
  Administered 2023-03-06 – 2023-03-11 (×5): 1 via ORAL
  Filled 2023-03-06 (×5): qty 1

## 2023-03-06 MED ORDER — HEPARIN (PORCINE) 25000 UT/250ML-% IV SOLN
1300.0000 [IU]/h | INTRAVENOUS | Status: DC
  Administered 2023-03-06: 1100 [IU]/h via INTRAVENOUS
  Administered 2023-03-07 – 2023-03-11 (×5): 1300 [IU]/h via INTRAVENOUS
  Filled 2023-03-06 (×7): qty 250

## 2023-03-06 MED ORDER — VITAMIN B-12 1000 MCG PO TABS
1000.0000 ug | ORAL_TABLET | ORAL | Status: DC
  Administered 2023-03-06 – 2023-03-11 (×3): 1000 ug via ORAL
  Filled 2023-03-06 (×4): qty 1

## 2023-03-06 MED ORDER — OXYCODONE-ACETAMINOPHEN 5-325 MG PO TABS
1.0000 | ORAL_TABLET | Freq: Once | ORAL | Status: AC
  Administered 2023-03-06: 1 via ORAL
  Filled 2023-03-06: qty 1

## 2023-03-06 MED ORDER — GABAPENTIN 400 MG PO CAPS
1200.0000 mg | ORAL_CAPSULE | Freq: Two times a day (BID) | ORAL | Status: DC
  Administered 2023-03-06 – 2023-03-11 (×10): 1200 mg via ORAL
  Filled 2023-03-06 (×10): qty 3

## 2023-03-06 MED ORDER — PANTOPRAZOLE SODIUM 40 MG PO TBEC
40.0000 mg | DELAYED_RELEASE_TABLET | Freq: Every day | ORAL | Status: DC
  Administered 2023-03-06 – 2023-03-11 (×5): 40 mg via ORAL
  Filled 2023-03-06 (×5): qty 1

## 2023-03-06 MED ORDER — BUSPIRONE HCL 10 MG PO TABS
5.0000 mg | ORAL_TABLET | Freq: Two times a day (BID) | ORAL | Status: DC
  Administered 2023-03-06 – 2023-03-11 (×10): 5 mg via ORAL
  Filled 2023-03-06 (×10): qty 1

## 2023-03-06 MED ORDER — ASPIRIN 81 MG PO TBEC
81.0000 mg | DELAYED_RELEASE_TABLET | Freq: Every day | ORAL | Status: DC
  Administered 2023-03-06 – 2023-03-11 (×5): 81 mg via ORAL
  Filled 2023-03-06 (×5): qty 1

## 2023-03-06 MED ORDER — ATORVASTATIN CALCIUM 20 MG PO TABS
80.0000 mg | ORAL_TABLET | Freq: Every day | ORAL | Status: DC
  Administered 2023-03-06 – 2023-03-11 (×5): 80 mg via ORAL
  Filled 2023-03-06 (×5): qty 4

## 2023-03-06 MED ORDER — HEPARIN BOLUS VIA INFUSION
4000.0000 [IU] | Freq: Once | INTRAVENOUS | Status: AC
  Administered 2023-03-06: 4000 [IU] via INTRAVENOUS
  Filled 2023-03-06: qty 4000

## 2023-03-06 MED ORDER — OXYCODONE-ACETAMINOPHEN 5-325 MG PO TABS
1.0000 | ORAL_TABLET | ORAL | Status: DC | PRN
  Administered 2023-03-06 – 2023-03-11 (×17): 1 via ORAL
  Filled 2023-03-06 (×17): qty 1

## 2023-03-06 MED ORDER — ONDANSETRON HCL 4 MG/2ML IJ SOLN
4.0000 mg | Freq: Once | INTRAMUSCULAR | Status: AC
  Administered 2023-03-06: 4 mg via INTRAVENOUS
  Filled 2023-03-06: qty 2

## 2023-03-06 MED ORDER — HYDROMORPHONE HCL 1 MG/ML IJ SOLN
1.0000 mg | Freq: Once | INTRAMUSCULAR | Status: AC
  Administered 2023-03-06: 1 mg via INTRAVENOUS
  Filled 2023-03-06: qty 1

## 2023-03-06 MED ORDER — DONEPEZIL HCL 5 MG PO TABS
5.0000 mg | ORAL_TABLET | Freq: Every day | ORAL | Status: DC
  Administered 2023-03-06 – 2023-03-11 (×6): 5 mg via ORAL
  Filled 2023-03-06 (×6): qty 1

## 2023-03-06 MED ORDER — SODIUM CHLORIDE 0.9 % IV BOLUS
1000.0000 mL | Freq: Once | INTRAVENOUS | Status: AC
  Administered 2023-03-06: 1000 mL via INTRAVENOUS

## 2023-03-06 MED ORDER — AMITRIPTYLINE HCL 75 MG PO TABS
150.0000 mg | ORAL_TABLET | Freq: Every day | ORAL | Status: DC
  Administered 2023-03-06 – 2023-03-11 (×6): 150 mg via ORAL
  Filled 2023-03-06 (×6): qty 2

## 2023-03-06 MED ORDER — MORPHINE SULFATE (PF) 2 MG/ML IV SOLN
2.0000 mg | INTRAVENOUS | Status: AC | PRN
  Administered 2023-03-06: 2 mg via INTRAVENOUS
  Filled 2023-03-06: qty 1

## 2023-03-06 MED ORDER — IOHEXOL 350 MG/ML SOLN
75.0000 mL | Freq: Once | INTRAVENOUS | Status: AC | PRN
  Administered 2023-03-06: 75 mL via INTRAVENOUS

## 2023-03-06 NOTE — ED Provider Notes (Signed)
Patient presents to the emergency department with left leg pain.  Patient with history of peripheral arterial disease and is followed by Dr. Wyn Quaker.  Patient tachycardic on arrival.  CTA without signs of a pulmonary embolism and left lower extremity without signs of a DVT.  On my evaluation patient without a palpable pulse to the left lower extremity and the foot appears cool.  Consulted and discussed with Dr. Wyn Quaker who recommended admission to the hospitalist, heparin with plan for arteriogram tomorrow.  .Critical Care  Performed by: Corena Herter, MD Authorized by: Corena Herter, MD   Critical care provider statement:    Critical care time (minutes):  45   Critical care time was exclusive of:  Separately billable procedures and treating other patients   Critical care was necessary to treat or prevent imminent or life-threatening deterioration of the following conditions:  Circulatory failure   Critical care was time spent personally by me on the following activities:  Development of treatment plan with patient or surrogate, discussions with consultants, evaluation of patient's response to treatment, examination of patient, ordering and review of laboratory studies, ordering and review of radiographic studies, ordering and performing treatments and interventions, pulse oximetry, re-evaluation of patient's condition and review of old charts     Corena Herter, MD 03/06/23 1535

## 2023-03-06 NOTE — ED Triage Notes (Signed)
BIBEMS, c/o L leg pain x3 weeks ago after skin Ca removal and lower back pain that started yesterday. Back pain radiates to L side of ABD. Pt states she is having trouble weight bearing on L leg, currently ambulates with cane. Pt reports increasing numbness in L foot but has Hx of neuropathy, PMH: DM

## 2023-03-06 NOTE — ED Provider Notes (Signed)
[facility] Provider Note    Event Date/Time   First MD Initiated Contact with Patient 03/06/23 0901     (approximate)   History   Back Pain and Leg Pain (Lower and bilateral leg pain)   HPI  Autumn Arnold is a 72 y.o. female   presents to the ED via ambulance with complaint of left leg pain for approximately 3 weeks.  She particularly has an area in her left groin that is tender to touch.  She denies any redness or edema.  Patient states she had a skin cancer removed from her left leg approximately 3 weeks ago.  She denies any injury to her leg or previous DVTs.  Patient states she is having trouble bearing weight and currently ambulates with a cane.  She reports some increasing numbness in her left foot but has a history of neuropathy.  She was seen in the emergency department yesterday but left last night without being seen.  Patient has a history of diabetes type 2, paroxysmal atrial fibrillation, carotid stenosis, carotid endarterectomy, peripheral vascular disease, low back pain, CVA, COPD, GERD, and smoking.      Physical Exam   Triage Vital Signs: ED Triage Vitals  Enc Vitals Group     BP 03/06/23 0903 (!) 120/95     Pulse Rate 03/06/23 0903 (!) 121     Resp 03/06/23 0903 18     Temp 03/06/23 0903 97.6 F (36.4 C)     Temp Source 03/06/23 0903 Oral     SpO2 03/06/23 0903 98 %     Weight 03/06/23 0904 144 lb (65.3 kg)     Height 03/06/23 0904 4\' 11"  (1.499 m)     Head Circumference --      Peak Flow --      Pain Score 03/06/23 0904 8     Pain Loc --      Pain Edu? --      Excl. in GC? --     Most recent vital signs: Vitals:   03/06/23 1315 03/06/23 1515  BP:    Pulse: (!) 122 (!) 123  Resp:    Temp:    SpO2: 95% 90%     General: Awake, no distress.  Alert, talkative, poor medical historian. CV:  Good peripheral perfusion.  Heart regular rate and rhythm Resp:  Normal effort.  Lungs are clear bilaterally. Abd:  No distention.   Soft, nontender, bowel sounds normoactive x 4 quadrants.  In the left groin area there is a small lymph node that is mobile.  No erythema or soft tissue edema in this area.  No evidence of injury to the skin.  Left lower extremity with moderate superficial varicosities and spider veins.  No soft tissue edema and pulse is present DP.  Patient is able to move digits without any difficulty.  Dorsalis pedis is difficult to palpate as was the lower extremity on the right.  ----------------------------------------- 3:20 PM on 03/06/2023 ----------------------------------------- Lower extremity is noted to be cooler than on initial exam.  Doppler was used and DP and PT were unable to access.   ED Results / Procedures / Treatments   Labs (all labs ordered are listed, but only abnormal results are displayed) Labs Reviewed  CBC WITH DIFFERENTIAL/PLATELET - Abnormal; Notable for the following components:      Result Value   WBC 11.1 (*)    RDW 16.6 (*)    Neutro Abs 8.6 (*)    All  other components within normal limits  COMPREHENSIVE METABOLIC PANEL - Abnormal; Notable for the following components:   Sodium 134 (*)    Glucose, Bld 181 (*)    AST 49 (*)    All other components within normal limits  URINALYSIS, ROUTINE W REFLEX MICROSCOPIC - Abnormal; Notable for the following components:   Color, Urine YELLOW (*)    APPearance CLEAR (*)    Glucose, UA 50 (*)    Hgb urine dipstick MODERATE (*)    Ketones, ur 5 (*)    Protein, ur 30 (*)    All other components within normal limits  PROTIME-INR  APTT      RADIOLOGY Ultrasound venous left lower extremity per radiologist is negative for DVT.  CT angio chest for PE per radiologist was negative for PE. Mild interstitial edema.      PROCEDURES:  Critical Care performed:   Procedures   MEDICATIONS ORDERED IN ED: Medications  heparin bolus via infusion 4,000 Units (4,000 Units Intravenous Bolus from Bag 03/06/23 1559)    Followed by   heparin ADULT infusion 100 units/mL (25000 units/251mL) (1,100 Units/hr Intravenous New Bag/Given 03/06/23 1556)  sodium chloride 0.9 % bolus 1,000 mL (1,000 mLs Intravenous New Bag/Given 03/06/23 1325)  oxyCODONE-acetaminophen (PERCOCET/ROXICET) 5-325 MG per tablet 1 tablet (1 tablet Oral Given 03/06/23 1410)  iohexol (OMNIPAQUE) 350 MG/ML injection 75 mL (75 mLs Intravenous Contrast Given 03/06/23 1349)  HYDROmorphone (DILAUDID) injection 1 mg (1 mg Intravenous Given 03/06/23 1538)  ondansetron (ZOFRAN) injection 4 mg (4 mg Intravenous Given 03/06/23 1538)     IMPRESSION / MDM / ASSESSMENT AND PLAN / ED COURSE  I reviewed the triage vital signs and the nursing notes.   Differential diagnosis includes, but is not limited to, DVT, PE, tachycardia etiology uncertain, left lower leg pain, questionable medical compliance, arterial occlusion left lower extremity,  72 year old female presents to the ED with left lower extremity pain started approximately 3 weeks ago.  Patient has an extensive history of vascular insufficiencies and has been seen by Dr. Wyn Quaker several times for procedures.  Patient came to the emergency department yesterday but left without being seen.  Lab work and vital signs were reviewed from last evening.  Workup initially included a venous ultrasound to exclude DVT to the left lower extremity.  It was also noted that patient was tachycardic today and yesterday.  Patient mention to the triage nurse yesterday that she has some shortness of breath but denies shortness of breath today.  Workup included a CBC with WBC 11.1, CMP nonfasting glucose at 181.  Urinalysis showed 30 protein and 5 ketones.  Ultrasound left lower extremity and CT angio chest for PE as noted above.  It was noted on recheck in talking with patient that her left lower extremity was much cooler than initially noted on her arrival to the ED.  Pulses were not noted with Doppler.  Consulted Dr. Wyn Quaker who has been her doctor who  recommended heparin and admission.  He plans to do a arteriogram tomorrow.  Consulted the hospitalist to make arrangements for patient admission.       Patient's presentation is most consistent with acute presentation with potential threat to life or bodily function.  FINAL CLINICAL IMPRESSION(S) / ED DIAGNOSES   Final diagnoses:  Arterial occlusion, lower extremity (HCC)     Rx / DC Orders   ED Discharge Orders     None        Note:  This document was prepared  using Conservation officer, historic buildings and may include unintentional dictation errors.   Tommi Rumps, PA-C 03/06/23 1605    Corena Herter, MD 03/06/23 1616

## 2023-03-06 NOTE — Consult Note (Signed)
Hospital Consult    Reason for Consult:  Left Lower Extremity Pain possible ischemia Requesting Physician:  Bridget Hartshorn [facility] MRN #:  [id number]  History of Present Illness: This is a 72 y.o. female presents to the ED via ambulance with complaint of left leg pain for approximately 3 weeks. She particularly has an area in her left groin that is tender to touch. She denies any redness or edema. She denies any injury to her leg or previous DVTs. Patient states she is having trouble bearing weight and currently ambulates with a cane. She reports some increasing numbness in her left foot but has a history of neuropathy. Patient has a history of diabetes type 2, carotid stenosis, carotid endarterectomy, peripheral vascular disease, low back pain, CVA, COPD, GERD, and smoking.   On exam this afternoon the patient rests comfortably in the emergency room.  Patient endorses 7 out of 10 pain to her left lower extremity.  Patient endorses numbness and states her leg feels cold.  Patient endorses smoking daily.  Past Medical History:  Diagnosis Date   Anxiety    Aortic atherosclerosis (HCC)    Arthritis    Asthma    Ataxic gait    a.) requires gait aids for assitance   Atherosclerotic PVD with intermittent claudication (HCC)    a.) s/p LEFT femoral endartarectomy and LEFT iliac stent 04/11/2022   Bilateral carotid artery disease (HCC) 11/07/2015   a.) doppler 11/07/2015: <50 BICA; b.) doppler 06/23/2021: near occ RICA< < 50% LICA; c.) doppler 04/05/2022: occluded RICA, mod plaque LICA   Bilateral lower extremity edema    CAD (coronary artery disease)    Chronic cough    COPD (chronic obstructive pulmonary disease) (HCC)    CVA (cerebral vascular accident) (HCC) 11/07/2015   a.) posterior superior LEFT lenticular nucleus to posterior LEFT corona radiata   DDD (degenerative disc disease), cervical    a.) s/p C5-C6 fusion   DOE (dyspnea on exertion)    GERD (gastroesophageal reflux disease)     History of bilateral cataract extraction 2017   History of left cataract extraction    Hyperlipidemia    Hypertension    Ischemic cerebrovascular accident (CVA) (HCC) 06/23/2021   a.) superior LEFT frontal and parietal ischemia   Ischemic cerebrovascular accident (CVA) (HCC) 04/04/2022   a.) subcentimeter cortical/subcortical aspect of RIGHT frontoparietal region --> acute to early subacte ischemic infarct   Long term current use of antithrombotics/antiplatelets    a.) DAPT (ASA + clopidogrel)   Lower extremity weakness    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HCC)    a.) s/p PTA and stenting 06/23/2022 --> SMA, mid-infrarenal, BILATERAL CIA; b.) s/p PTA and stenting 09/25/2022: SMA, BILATERAL CIA, infrarenal aorta, LEFT CFA   OSA on CPAP    PAF (paroxysmal atrial fibrillation) (HCC)    Right-sided lacunar infarction (HCC) 02/09/2010   a.) non-hemorrhagic   Small fiber neuropathy    T2DM (type 2 diabetes mellitus) (HCC)    Vitamin D deficiency    Wheezing     Past Surgical History:  Procedure Laterality Date   ABDOMINAL HYSTERECTOMY     ANTERIOR FUSION CERVICAL SPINE     CARPAL TUNNEL RELEASE Bilateral    CATARACT EXTRACTION W/PHACO Left 03/12/2016   Procedure: CATARACT EXTRACTION PHACO AND INTRAOCULAR LENS PLACEMENT (IOC);  Surgeon: Sallee Lange, MD;  Location: [facility];  Service: Ophthalmology;  Laterality: Left;  [place] 01:12 AP% 23.4 CDE 29.83 fluid pack lot # [id number]   CATARACT EXTRACTION W/PHACO Right 04/16/2016  Procedure: CATARACT EXTRACTION PHACO AND INTRAOCULAR LENS PLACEMENT (IOC);  Surgeon: Sallee Lange, MD;  Location: [facility];  Service: Ophthalmology;  Laterality: Right;  [place] 01:17 AP% 22.5 CDE 35.08 Fluid pack lot # [id number]   CESAREAN SECTION     COLONOSCOPY WITH PROPOFOL N/A 08/12/2018   Procedure: COLONOSCOPY WITH PROPOFOL;  Surgeon: Toledo, Boykin Nearing, MD;  Location: [facility];  Service: Gastroenterology;  Laterality: N/A;   ELBOW SURGERY     Tendonitis    ENDARTERECTOMY Right 10/04/2022   Procedure: ENDARTERECTOMY CAROTID;  Surgeon: Annice Needy, MD;  Location: [facility];  Service: Vascular;  Laterality: Right;   ENDARTERECTOMY FEMORAL Left 04/11/2022   Procedure: ENDARTERECTOMY FEMORAL;  Surgeon: Annice Needy, MD;  Location: [facility];  Service: Vascular;  Laterality: Left;   ESOPHAGOGASTRODUODENOSCOPY (EGD) WITH PROPOFOL N/A 08/12/2018   Procedure: ESOPHAGOGASTRODUODENOSCOPY (EGD) WITH PROPOFOL;  Surgeon: Toledo, Boykin Nearing, MD;  Location: [facility];  Service: Gastroenterology;  Laterality: N/A;   INCONTINENCE SURGERY     INSERTION OF ILIAC STENT Left 04/11/2022   Procedure: INSERTION OF ILIAC STENT;  Surgeon: Annice Needy, MD;  Location: [facility];  Service: Vascular;  Laterality: Left;   INSERTION OF RETROGRADE CAROTID STENT Right 10/04/2022   Procedure: INSERTION OF RETROGRADE CAROTID STENT;  Surgeon: Annice Needy, MD;  Location: [facility];  Service: Vascular;  Laterality: Right;   LOWER EXTREMITY ANGIOGRAPHY Left 06/23/2021   Procedure: Lower Extremity Angiography;  Surgeon: Renford Dills, MD;  Location: [facility];  Service: Cardiovascular;  Laterality: Left;   LOWER EXTREMITY ANGIOGRAPHY Left 04/05/2022   Procedure: Lower Extremity Angiography;  Surgeon: Annice Needy, MD;  Location: [facility];  Service: Cardiovascular;  Laterality: Left;   LOWER EXTREMITY ANGIOGRAPHY Left 06/25/2022   Procedure: Lower Extremity Angiography;  Surgeon: Annice Needy, MD;  Location: [facility];  Service: Cardiovascular;  Laterality: Left;   LOWER EXTREMITY ANGIOGRAPHY Left 07/11/2022   Procedure: Lower Extremity Angiography;  Surgeon: Annice Needy, MD;  Location: [facility];  Service: Cardiovascular;  Laterality: Left;   TONSILLECTOMY      Allergies  Allergen Reactions   Lisinopril Other (See Comments)    Tongue burning sensation    Prior to Admission medications   Medication Sig Start Date End Date  Taking? Authorizing Provider  acetaminophen (TYLENOL) 500 MG tablet Take 1,000 mg by mouth every 8 (eight) hours as needed for mild pain.    [provider]  albuterol (VENTOLIN HFA) 108 (90 Base) MCG/ACT inhaler Inhale 2 puffs into the lungs every 4 (four) hours as needed for wheezing.    [provider]  alendronate (FOSAMAX) 70 MG tablet Take 70 mg by mouth once a week. Take with a full glass of water on an empty stomach.    [provider]  amitriptyline (ELAVIL) 150 MG tablet Take 150 mg by mouth at bedtime.    [provider]  aspirin EC 81 MG EC tablet Take 1 tablet (81 mg total) by mouth daily. 11/08/15   Enid Baas, MD  atorvastatin (LIPITOR) 80 MG tablet Take 80 mg by mouth daily. 06/13/21   [provider]  busPIRone (BUSPAR) 5 MG tablet Take 5 mg by mouth 2 (two) times daily. 04/18/21   [provider]  Calcium Carb-Cholecalciferol 600-400 MG-UNIT TABS Take 1 tablet by mouth daily.    [provider]  clopidogrel (PLAVIX) 75 MG tablet Take 1 tablet (75 mg total) by  mouth daily. 09/24/22   Georgiana Spinner, NP  donepezil (ARICEPT) 5 MG tablet Take 5 mg by mouth at bedtime. 12/14/22   [provider]  gabapentin (NEURONTIN) 600 MG tablet Take 1,200 mg by mouth 2 (two) times daily. Two tablets twice daily 04/18/21   [provider]  metFORMIN (GLUCOPHAGE) 1000 MG tablet Take 1,000 mg by mouth 2 (two) times daily. 06/13/21   [provider]  metoprolol tartrate (LOPRESSOR) 25 MG tablet Take 0.5 tablets (12.5 mg total) by mouth 2 (two) times daily. 04/13/22   Sunnie Nielsen, DO  montelukast (SINGULAIR) 10 MG tablet Take 10 mg by mouth daily. 06/13/21   [provider]  Multiple Vitamin (MULTIVITAMIN WITH MINERALS) TABS tablet Take 1 tablet by mouth daily. 06/29/21   Enedina Finner, MD  oxyCODONE-acetaminophen (PERCOCET/ROXICET) 5-325 MG tablet Take 1 tablet by mouth every 4 (four) hours as needed for  moderate pain. 10/05/22   Schae Cando, Peggye Ley, NP  pantoprazole (PROTONIX) 40 MG tablet Take 40 mg by mouth daily. 01/30/21   [provider]  senna-docusate (SENOKOT-S) 8.6-50 MG tablet Take 2 tablets by mouth at bedtime as needed for mild constipation. Patient not taking: Reported on 09/24/2022 04/13/22   Sunnie Nielsen, DO  sertraline (ZOLOFT) 50 MG tablet Take 3 tablets (150 mg total) by mouth daily. Patient taking differently: Take 150 mg by mouth daily. 1 and 1/2 tablets daily 06/28/21   Enedina Finner, MD  tiZANidine (ZANAFLEX) 4 MG tablet Take 4 mg by mouth 2 (two) times daily. 05/22/21   [provider]  vitamin B-12 (CYANOCOBALAMIN) 1000 MCG tablet Take 1,000 mcg by mouth 3 (three) times a week.    [provider]    Social History   Socioeconomic History   Marital status: Married    Spouse name: Maisie Fus   Number of children: 2   Years of education: Not on file   Highest education level: Not on file  Occupational History   Not on file  Tobacco Use   Smoking status: Every Day    Packs/day: 1    Types: Cigarettes   Smokeless tobacco: Never  Vaping Use   Vaping Use: Never used  Substance and Sexual Activity   Alcohol use: Not Currently    Comment: occ   Drug use: No   Sexual activity: Not on file  Other Topics Concern   Not on file  Social History Narrative   Lives at home with husband; sister Claris Gladden lives in the area    Social Determinants of Health   Financial Resource Strain: Not on file  Food Insecurity: Not on file  Transportation Needs: Not on file  Physical Activity: Not on file  Stress: Not on file  Social Connections: Not on file  Intimate Partner Violence: Not on file     Family History  Problem Relation Age of Onset   CAD Mother    Hypertension Mother    Arthritis Mother    Diabetes Father    CAD Father     ROS: Otherwise negative unless mentioned in HPI  Physical Examination  Vitals:   03/06/23 1315 03/06/23 1515  BP:     Pulse: (!) 122 (!) 123  Resp:    Temp:    SpO2: 95% 90%   Body mass index is 29.08 kg/m.  General:  WDWN in NAD Gait: Not observed HENT: WNL, normocephalic Pulmonary: normal non-labored breathing, without Rales, rhonchi,  wheezing Cardiac: regular, without  Murmurs, rubs or gallops; with carotid  bruits Abdomen: Positive bowel sounds, soft, NT/ND, no masses Skin: without rashes Vascular Exam/Pulses: Positive Palpable left lower extremity pulses.  Extremities: with ischemic changes, without Gangrene , with cellulitis; without open wounds;  Musculoskeletal: no muscle wasting or atrophy  Neurologic: A&O X 3;  No focal weakness or paresthesias are detected; speech is fluent/normal Psychiatric:  The pt has Abnormal- Flat  affect. Lymph:  Unremarkable  CBC    Component Value Date/Time   WBC 11.1 (H) 03/06/2023 0950   RBC 4.44 03/06/2023 0950   HGB 12.6 03/06/2023 0950   HCT 38.2 03/06/2023 0950   PLT 188 03/06/2023 0950   MCV 86.0 03/06/2023 0950   MCH 28.4 03/06/2023 0950   MCHC 33.0 03/06/2023 0950   RDW 16.6 (H) 03/06/2023 0950   LYMPHSABS 1.8 03/06/2023 0950   MONOABS 0.7 03/06/2023 0950   EOSABS 0.0 03/06/2023 0950   BASOSABS 0.0 03/06/2023 0950    BMET    Component Value Date/Time   NA 134 (L) 03/06/2023 0950   K 3.8 03/06/2023 0950   CL 98 03/06/2023 0950   CO2 23 03/06/2023 0950   GLUCOSE 181 (H) 03/06/2023 0950   BUN 12 03/06/2023 0950   CREATININE 0.73 03/06/2023 0950   CALCIUM 9.0 03/06/2023 0950   GFRNONAA >60 03/06/2023 0950   GFRAA >60 11/08/2015 0419    COAGS: Lab Results  Component Value Date   INR 1.0 01/08/2023   INR 1.0 06/22/2021   INR 0.89 11/07/2015     Non-Invasive Vascular Imaging:   EXAM: LEFT LOWER EXTREMITY VENOUS DOPPLER ULTRASOUND   TECHNIQUE: Gray-scale sonography with graded compression, as well as color Doppler and duplex ultrasound were performed to evaluate the lower extremity deep venous systems from the level of the  common femoral vein and including the common femoral, femoral, profunda femoral, popliteal and calf veins including the posterior tibial, peroneal and gastrocnemius veins when visible. The superficial great saphenous vein was also interrogated. Spectral Doppler was utilized to evaluate flow at rest and with distal augmentation maneuvers in the common femoral, femoral and popliteal veins.   COMPARISON:  None Available.   FINDINGS: Contralateral Common Femoral Vein: Respiratory phasicity is normal and symmetric with the symptomatic side. No evidence of thrombus. Normal compressibility.   Common Femoral Vein: No evidence of thrombus. Normal compressibility, respiratory phasicity and response to augmentation.   Saphenofemoral Junction: No evidence of thrombus. Normal compressibility and flow on color Doppler imaging.   Profunda Femoral Vein: No evidence of thrombus. Normal compressibility and flow on color Doppler imaging.   Femoral Vein: No evidence of thrombus. Normal compressibility, respiratory phasicity and response to augmentation.   Popliteal Vein: No evidence of thrombus. Normal compressibility, respiratory phasicity and response to augmentation.   Calf Veins: No evidence of thrombus. Normal compressibility and flow on color Doppler imaging.   Superficial Great Saphenous Vein: No evidence of thrombus. Normal compressibility.   Other Findings: Note is made of a mildly prominent though non pathologically enlarged left lymph node which is not enlarged by size criteria measuring 0.5 cm in greatest short axis diameter maintains hilum (image 35). Note is made arterial stent within the left popliteal artery).   IMPRESSION: No evidence of DVT within the left lower extremity   EXAM: 01/09/2023 NONINVASIVE PHYSIOLOGIC VASCULAR STUDY OF BILATERAL LOWER EXTREMITIES   TECHNIQUE: Evaluation of both lower extremities were performed at rest, including calculation of  ankle-brachial indices with single level Doppler, pressure and pulse volume recording.   COMPARISON:  None Available.  FINDINGS: Right ABI: 0.62   Left ABI: 0.64   Right Lower Extremity: Abnormal monophasic arterial waveforms at the ankle.   Left Lower Extremity: Abnormal monophasic arterial waveforms at the ankle.   0.5-0.79 Moderate PAD   IMPRESSION: Symmetrically abnormal bilateral ankle-brachial indices consistent with at least moderate underlying peripheral arterial disease. Given the symmetry, aortoiliac disease or bilateral femoropopliteal disease is favored.   Statin:  Yes.   Beta Blocker:  Yes.   Aspirin:  Yes.   ACEI:  No. ARB:  No. CCB use:  No Other antiplatelets/anticoagulants:  No.    ASSESSMENT/PLAN: This is a 72 y.o. female presents to [facility] emergency department with worsening pain of her left lower extremity.  Patient states that it feels as if she has loss of blood flow to her foot as she has in the past.  Patient states her leg feels cold all the way through.  Patient's last ABIs are listed above. She endorses she has continued to smoke.  Plan: Vascular surgery will take the patient to the vascular lab tomorrow 03/07/2023 for a left lower extremity angiogram.  Patient was started on a heparin infusion. I discussed in detail with the patient and her brother at the bedside the procedure, benefits, complications, risks.  Both verbalized their understanding and wished to proceed with the procedure as soon as possible.  Patient will be made n.p.o. after midnight.   -I discussed the plan with Dr. Festus Barren MD and he is in agreement with the plan   Marcie Bal [facility] 03/06/2023 3:27 PM

## 2023-03-06 NOTE — Progress Notes (Signed)
   03/06/23 1725  Assess: MEWS Score  Temp 98.1 F (36.7 C)  BP (!) 114/47  MAP (mmHg) 67  Pulse Rate (!) 121  Resp 18  Level of Consciousness Alert  SpO2 97 %  Assess: MEWS Score  MEWS Temp 0  MEWS Systolic 0  MEWS Pulse 2  MEWS RR 0  MEWS LOC 0  MEWS Score 2  MEWS Score Color Yellow  Assess: if the MEWS score is Yellow or Red  Were vital signs taken at a resting state? Yes  Focused Assessment No change from prior assessment  Does the patient meet 2 or more of the SIRS criteria? No  MEWS guidelines implemented  Yes, yellow  Treat  MEWS Interventions Considered administering scheduled or prn medications/treatments as ordered  Take Vital Signs  Increase Vital Sign Frequency  Yellow: Q2hr x1, continue Q4hrs until patient remains green for 12hrs  Escalate  MEWS: Escalate Yellow: Discuss with charge nurse and consider notifying provider and/or RRT  Notify: Charge Nurse/RN  Name of Charge Nurse/RN Notified Velvet, RN  Provider Notification  Provider Name/Title Zhang  Date Provider Notified 03/06/23  Time Provider Notified 1725  Method of Notification Call  Notification Reason Other (Comment) (Elevated heartrate that had not been addressed in ED)  Provider response Evaluate remotely  Date of Provider Response 03/06/23  Time of Provider Response 1725  Assess: SIRS CRITERIA  SIRS Temperature  0  SIRS Pulse 1  SIRS Respirations  0  SIRS WBC 0  SIRS Score Sum  1   RN gave scheduled medication. MD ordered telemetry for patient.

## 2023-03-06 NOTE — Telephone Encounter (Signed)
Patient LVM stating that EMT was there to pick her up she was hurting so bad, stated she' wanted [place] to meet her there". Let NP Sheppard Plumber know, she stated once she is evaluated they will call us if needed. Left this information on patients VM

## 2023-03-06 NOTE — Consult Note (Signed)
ANTICOAGULATION CONSULT NOTE - Initial Consult  Pharmacy Consult for heparin infusion Indication:  Vascular occlusion left lower extremity  Allergies  Allergen Reactions   Lisinopril Other (See Comments)    Tongue burning sensation    Patient Measurements: Height: 4\' 11"  (149.9 cm) Weight: 65.3 kg (144 lb) IBW/kg (Calculated) : 43.2 Heparin Dosing Weight: 65.3 kg  Vital Signs: Temp: 97.7 F (36.5 C) (05/01 1031) Temp Source: Oral (05/01 1031) BP: 107/67 (05/01 1220) Pulse Rate: 119 (05/01 1220)  Labs: Recent Labs    03/05/23 1803 03/06/23 0950  HGB 12.6 12.6  HCT 39.2 38.2  PLT 207 188  CREATININE 0.84 0.73    Estimated Creatinine Clearance: 53.7 mL/min (by C-G formula based on SCr of 0.73 mg/dL).   Medical History: Past Medical History:  Diagnosis Date   Anxiety    Aortic atherosclerosis (HCC)    Arthritis    Asthma    Ataxic gait    a.) requires gait aids for assitance   Atherosclerotic PVD with intermittent claudication (HCC)    a.) s/p LEFT femoral endartarectomy and LEFT iliac stent 04/11/2022   Bilateral carotid artery disease (HCC) 11/07/2015   a.) doppler 11/07/2015: <50 BICA; b.) doppler 06/23/2021: near occ RICA< < 50% LICA; c.) doppler 04/05/2022: occluded RICA, mod plaque LICA   Bilateral lower extremity edema    CAD (coronary artery disease)    Chronic cough    COPD (chronic obstructive pulmonary disease) (HCC)    CVA (cerebral vascular accident) (HCC) 11/07/2015   a.) posterior superior LEFT lenticular nucleus to posterior LEFT corona radiata   DDD (degenerative disc disease), cervical    a.) s/p C5-C6 fusion   DOE (dyspnea on exertion)    GERD (gastroesophageal reflux disease)    History of bilateral cataract extraction 2017   History of left cataract extraction    Hyperlipidemia    Hypertension    Ischemic cerebrovascular accident (CVA) (HCC) 06/23/2021   a.) superior LEFT frontal and parietal ischemia   Ischemic cerebrovascular  accident (CVA) (HCC) 04/04/2022   a.) subcentimeter cortical/subcortical aspect of RIGHT frontoparietal region --> acute to early subacte ischemic infarct   Long term current use of antithrombotics/antiplatelets    a.) DAPT (ASA + clopidogrel)   Lower extremity weakness    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HCC)    a.) s/p PTA and stenting 06/23/2022 --> SMA, mid-infrarenal, BILATERAL CIA; b.) s/p PTA and stenting 09/25/2022: SMA, BILATERAL CIA, infrarenal aorta, LEFT CFA   OSA on CPAP    PAF (paroxysmal atrial fibrillation) (HCC)    Right-sided lacunar infarction (HCC) 02/09/2010   a.) non-hemorrhagic   Small fiber neuropathy    T2DM (type 2 diabetes mellitus) (HCC)    Vitamin D deficiency    Wheezing     Medications:  No prior anticoagulation noted  Assessment: 72 y.o. female presents to the ED via ambulance with complaint of left leg pain for approximately 3 weeks. PMH of peripheral vascular disease. Concern for ischemic LLE. Pharmacy has been consulted to initiate and manage IV heparin therapy.    Goal of Therapy:  Heparin level 0.3-0.7 units/ml Monitor platelets by anticoagulation protocol: Yes   Plan:  Give 4000 units bolus x 1 Start heparin infusion at 1100  units/hr Check anti-Xa level in 6 hours and daily while on heparin Continue to monitor H&H and platelets  Sharen Hones, PharmD, BCPS Clinical Pharmacist   03/06/2023,3:18 PM

## 2023-03-06 NOTE — ED Notes (Signed)
When Pt fell asleep, Pts O2 sats dropped to 85% on RA, placed on 2L [place], sats are now 99%. Pt stated she was dx with sleep apnea but has not worn a CPAP for a long time

## 2023-03-06 NOTE — H&P (View-Only) (Signed)
Hospital Consult    Reason for Consult:  Left Lower Extremity Pain possible ischemia Requesting Physician:  Rhonda Summers PAC MRN #:  [id number]  History of Present Illness: This is a 72 y.o. female presents to the ED via ambulance with complaint of left leg pain for approximately 3 weeks. She particularly has an area in her left groin that is tender to touch. She denies any redness or edema. She denies any injury to her leg or previous DVTs. Patient states she is having trouble bearing weight and currently ambulates with a cane. She reports some increasing numbness in her left foot but has a history of neuropathy. Patient has a history of diabetes type 2, carotid stenosis, carotid endarterectomy, peripheral vascular disease, low back pain, CVA, COPD, GERD, and smoking.   On exam this afternoon the patient rests comfortably in the emergency room.  Patient endorses 7 out of 10 pain to her left lower extremity.  Patient endorses numbness and states her leg feels cold.  Patient endorses smoking daily.  Past Medical History:  Diagnosis Date   Anxiety    Aortic atherosclerosis (HCC)    Arthritis    Asthma    Ataxic gait    a.) requires gait aids for assitance   Atherosclerotic PVD with intermittent claudication (HCC)    a.) s/p LEFT femoral endartarectomy and LEFT iliac stent 04/11/2022   Bilateral carotid artery disease (HCC) 11/07/2015   a.) doppler 11/07/2015: <50 BICA; b.) doppler 06/23/2021: near occ RICA< < 50% LICA; c.) doppler 04/05/2022: occluded RICA, mod plaque LICA   Bilateral lower extremity edema    CAD (coronary artery disease)    Chronic cough    COPD (chronic obstructive pulmonary disease) (HCC)    CVA (cerebral vascular accident) (HCC) 11/07/2015   a.) posterior superior LEFT lenticular nucleus to posterior LEFT corona radiata   DDD (degenerative disc disease), cervical    a.) s/p C5-C6 fusion   DOE (dyspnea on exertion)    GERD (gastroesophageal reflux disease)     History of bilateral cataract extraction 2017   History of left cataract extraction    Hyperlipidemia    Hypertension    Ischemic cerebrovascular accident (CVA) (HCC) 06/23/2021   a.) superior LEFT frontal and parietal ischemia   Ischemic cerebrovascular accident (CVA) (HCC) 04/04/2022   a.) subcentimeter cortical/subcortical aspect of RIGHT frontoparietal region --> acute to early subacte ischemic infarct   Long term current use of antithrombotics/antiplatelets    a.) DAPT (ASA + clopidogrel)   Lower extremity weakness    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HCC)    a.) s/p PTA and stenting 06/23/2022 --> SMA, mid-infrarenal, BILATERAL CIA; b.) s/p PTA and stenting 09/25/2022: SMA, BILATERAL CIA, infrarenal aorta, LEFT CFA   OSA on CPAP    PAF (paroxysmal atrial fibrillation) (HCC)    Right-sided lacunar infarction (HCC) 02/09/2010   a.) non-hemorrhagic   Small fiber neuropathy    T2DM (type 2 diabetes mellitus) (HCC)    Vitamin D deficiency    Wheezing     Past Surgical History:  Procedure Laterality Date   ABDOMINAL HYSTERECTOMY     ANTERIOR FUSION CERVICAL SPINE     CARPAL TUNNEL RELEASE Bilateral    CATARACT EXTRACTION W/PHACO Left 03/12/2016   Procedure: CATARACT EXTRACTION PHACO AND INTRAOCULAR LENS PLACEMENT (IOC);  Surgeon: Steven Dingeldein, MD;  Location: [facility];  Service: Ophthalmology;  Laterality: Left;  US 01:12 AP% 23.4 CDE 29.83 fluid pack lot # [id number]   CATARACT EXTRACTION W/PHACO Right 04/16/2016     Procedure: CATARACT EXTRACTION PHACO AND INTRAOCULAR LENS PLACEMENT (IOC);  Surgeon: Steven Dingeldein, MD;  Location: [facility];  Service: Ophthalmology;  Laterality: Right;  US 01:17 AP% 22.5 CDE 35.08 Fluid pack lot # [id number]   CESAREAN SECTION     COLONOSCOPY WITH PROPOFOL N/A 08/12/2018   Procedure: COLONOSCOPY WITH PROPOFOL;  Surgeon: Toledo, Teodoro K, MD;  Location: [facility];  Service: Gastroenterology;  Laterality: N/A;   ELBOW SURGERY     Tendonitis    ENDARTERECTOMY Right 10/04/2022   Procedure: ENDARTERECTOMY CAROTID;  Surgeon: Dew, Jason S, MD;  Location: [facility];  Service: Vascular;  Laterality: Right;   ENDARTERECTOMY FEMORAL Left 04/11/2022   Procedure: ENDARTERECTOMY FEMORAL;  Surgeon: Dew, Jason S, MD;  Location: [facility];  Service: Vascular;  Laterality: Left;   ESOPHAGOGASTRODUODENOSCOPY (EGD) WITH PROPOFOL N/A 08/12/2018   Procedure: ESOPHAGOGASTRODUODENOSCOPY (EGD) WITH PROPOFOL;  Surgeon: Toledo, Teodoro K, MD;  Location: [facility];  Service: Gastroenterology;  Laterality: N/A;   INCONTINENCE SURGERY     INSERTION OF ILIAC STENT Left 04/11/2022   Procedure: INSERTION OF ILIAC STENT;  Surgeon: Dew, Jason S, MD;  Location: [facility];  Service: Vascular;  Laterality: Left;   INSERTION OF RETROGRADE CAROTID STENT Right 10/04/2022   Procedure: INSERTION OF RETROGRADE CAROTID STENT;  Surgeon: Dew, Jason S, MD;  Location: [facility];  Service: Vascular;  Laterality: Right;   LOWER EXTREMITY ANGIOGRAPHY Left 06/23/2021   Procedure: Lower Extremity Angiography;  Surgeon: Schnier, Gregory G, MD;  Location: [facility];  Service: Cardiovascular;  Laterality: Left;   LOWER EXTREMITY ANGIOGRAPHY Left 04/05/2022   Procedure: Lower Extremity Angiography;  Surgeon: Dew, Jason S, MD;  Location: [facility];  Service: Cardiovascular;  Laterality: Left;   LOWER EXTREMITY ANGIOGRAPHY Left 06/25/2022   Procedure: Lower Extremity Angiography;  Surgeon: Dew, Jason S, MD;  Location: [facility];  Service: Cardiovascular;  Laterality: Left;   LOWER EXTREMITY ANGIOGRAPHY Left 07/11/2022   Procedure: Lower Extremity Angiography;  Surgeon: Dew, Jason S, MD;  Location: [facility];  Service: Cardiovascular;  Laterality: Left;   TONSILLECTOMY      Allergies  Allergen Reactions   Lisinopril Other (See Comments)    Tongue burning sensation    Prior to Admission medications   Medication Sig Start Date End Date  Taking? Authorizing Provider  acetaminophen (TYLENOL) 500 MG tablet Take 1,000 mg by mouth every 8 (eight) hours as needed for mild pain.    [provider]  albuterol (VENTOLIN HFA) 108 (90 Base) MCG/ACT inhaler Inhale 2 puffs into the lungs every 4 (four) hours as needed for wheezing.    [provider]  alendronate (FOSAMAX) 70 MG tablet Take 70 mg by mouth once a week. Take with a full glass of water on an empty stomach.    [provider]  amitriptyline (ELAVIL) 150 MG tablet Take 150 mg by mouth at bedtime.    [provider]  aspirin EC 81 MG EC tablet Take 1 tablet (81 mg total) by mouth daily. 11/08/15   Kalisetti, Radhika, MD  atorvastatin (LIPITOR) 80 MG tablet Take 80 mg by mouth daily. 06/13/21   [provider]  busPIRone (BUSPAR) 5 MG tablet Take 5 mg by mouth 2 (two) times daily. 04/18/21   [provider]  Calcium Carb-Cholecalciferol 600-400 MG-UNIT TABS Take 1 tablet by mouth daily.    [provider]  clopidogrel (PLAVIX) 75 MG tablet Take 1 tablet (75 mg total) by   mouth daily. 09/24/22   Brown, Fallon E, NP  donepezil (ARICEPT) 5 MG tablet Take 5 mg by mouth at bedtime. 12/14/22   [provider]  gabapentin (NEURONTIN) 600 MG tablet Take 1,200 mg by mouth 2 (two) times daily. Two tablets twice daily 04/18/21   [provider]  metFORMIN (GLUCOPHAGE) 1000 MG tablet Take 1,000 mg by mouth 2 (two) times daily. 06/13/21   [provider]  metoprolol tartrate (LOPRESSOR) 25 MG tablet Take 0.5 tablets (12.5 mg total) by mouth 2 (two) times daily. 04/13/22   Alexander, Natalie, DO  montelukast (SINGULAIR) 10 MG tablet Take 10 mg by mouth daily. 06/13/21   [provider]  Multiple Vitamin (MULTIVITAMIN WITH MINERALS) TABS tablet Take 1 tablet by mouth daily. 06/29/21   Patel, Sona, MD  oxyCODONE-acetaminophen (PERCOCET/ROXICET) 5-325 MG tablet Take 1 tablet by mouth every 4 (four) hours as needed for  moderate pain. 10/05/22   Devere Brem R, NP  pantoprazole (PROTONIX) 40 MG tablet Take 40 mg by mouth daily. 01/30/21   [provider]  senna-docusate (SENOKOT-S) 8.6-50 MG tablet Take 2 tablets by mouth at bedtime as needed for mild constipation. Patient not taking: Reported on 09/24/2022 04/13/22   Alexander, Natalie, DO  sertraline (ZOLOFT) 50 MG tablet Take 3 tablets (150 mg total) by mouth daily. Patient taking differently: Take 150 mg by mouth daily. 1 and 1/2 tablets daily 06/28/21   Patel, Sona, MD  tiZANidine (ZANAFLEX) 4 MG tablet Take 4 mg by mouth 2 (two) times daily. 05/22/21   [provider]  vitamin B-12 (CYANOCOBALAMIN) 1000 MCG tablet Take 1,000 mcg by mouth 3 (three) times a week.    [provider]    Social History   Socioeconomic History   Marital status: Married    Spouse name: Thomas   Number of children: 2   Years of education: Not on file   Highest education level: Not on file  Occupational History   Not on file  Tobacco Use   Smoking status: Every Day    Packs/day: 1    Types: Cigarettes   Smokeless tobacco: Never  Vaping Use   Vaping Use: Never used  Substance and Sexual Activity   Alcohol use: Not Currently    Comment: occ   Drug use: No   Sexual activity: Not on file  Other Topics Concern   Not on file  Social History Narrative   Lives at home with husband; sister Gale lives in the area    Social Determinants of Health   Financial Resource Strain: Not on file  Food Insecurity: Not on file  Transportation Needs: Not on file  Physical Activity: Not on file  Stress: Not on file  Social Connections: Not on file  Intimate Partner Violence: Not on file     Family History  Problem Relation Age of Onset   CAD Mother    Hypertension Mother    Arthritis Mother    Diabetes Father    CAD Father     ROS: Otherwise negative unless mentioned in HPI  Physical Examination  Vitals:   03/06/23 1315 03/06/23 1515  BP:     Pulse: (!) 122 (!) 123  Resp:    Temp:    SpO2: 95% 90%   Body mass index is 29.08 kg/m.  General:  WDWN in NAD Gait: Not observed HENT: WNL, normocephalic Pulmonary: normal non-labored breathing, without Rales, rhonchi,  wheezing Cardiac: regular, without  Murmurs, rubs or gallops; with carotid   bruits Abdomen: Positive bowel sounds, soft, NT/ND, no masses Skin: without rashes Vascular Exam/Pulses: Positive Palpable left lower extremity pulses.  Extremities: with ischemic changes, without Gangrene , with cellulitis; without open wounds;  Musculoskeletal: no muscle wasting or atrophy  Neurologic: A&O X 3;  No focal weakness or paresthesias are detected; speech is fluent/normal Psychiatric:  The pt has Abnormal- Flat  affect. Lymph:  Unremarkable  CBC    Component Value Date/Time   WBC 11.1 (H) 03/06/2023 0950   RBC 4.44 03/06/2023 0950   HGB 12.6 03/06/2023 0950   HCT 38.2 03/06/2023 0950   PLT 188 03/06/2023 0950   MCV 86.0 03/06/2023 0950   MCH 28.4 03/06/2023 0950   MCHC 33.0 03/06/2023 0950   RDW 16.6 (H) 03/06/2023 0950   LYMPHSABS 1.8 03/06/2023 0950   MONOABS 0.7 03/06/2023 0950   EOSABS 0.0 03/06/2023 0950   BASOSABS 0.0 03/06/2023 0950    BMET    Component Value Date/Time   NA 134 (L) 03/06/2023 0950   K 3.8 03/06/2023 0950   CL 98 03/06/2023 0950   CO2 23 03/06/2023 0950   GLUCOSE 181 (H) 03/06/2023 0950   BUN 12 03/06/2023 0950   CREATININE 0.73 03/06/2023 0950   CALCIUM 9.0 03/06/2023 0950   GFRNONAA >60 03/06/2023 0950   GFRAA >60 11/08/2015 0419    COAGS: Lab Results  Component Value Date   INR 1.0 01/08/2023   INR 1.0 06/22/2021   INR 0.89 11/07/2015     Non-Invasive Vascular Imaging:   EXAM: LEFT LOWER EXTREMITY VENOUS DOPPLER ULTRASOUND   TECHNIQUE: Gray-scale sonography with graded compression, as well as color Doppler and duplex ultrasound were performed to evaluate the lower extremity deep venous systems from the level of the  common femoral vein and including the common femoral, femoral, profunda femoral, popliteal and calf veins including the posterior tibial, peroneal and gastrocnemius veins when visible. The superficial great saphenous vein was also interrogated. Spectral Doppler was utilized to evaluate flow at rest and with distal augmentation maneuvers in the common femoral, femoral and popliteal veins.   COMPARISON:  None Available.   FINDINGS: Contralateral Common Femoral Vein: Respiratory phasicity is normal and symmetric with the symptomatic side. No evidence of thrombus. Normal compressibility.   Common Femoral Vein: No evidence of thrombus. Normal compressibility, respiratory phasicity and response to augmentation.   Saphenofemoral Junction: No evidence of thrombus. Normal compressibility and flow on color Doppler imaging.   Profunda Femoral Vein: No evidence of thrombus. Normal compressibility and flow on color Doppler imaging.   Femoral Vein: No evidence of thrombus. Normal compressibility, respiratory phasicity and response to augmentation.   Popliteal Vein: No evidence of thrombus. Normal compressibility, respiratory phasicity and response to augmentation.   Calf Veins: No evidence of thrombus. Normal compressibility and flow on color Doppler imaging.   Superficial Great Saphenous Vein: No evidence of thrombus. Normal compressibility.   Other Findings: Note is made of a mildly prominent though non pathologically enlarged left lymph node which is not enlarged by size criteria measuring 0.5 cm in greatest short axis diameter maintains hilum (image 35). Note is made arterial stent within the left popliteal artery).   IMPRESSION: No evidence of DVT within the left lower extremity   EXAM: 01/09/2023 NONINVASIVE PHYSIOLOGIC VASCULAR STUDY OF BILATERAL LOWER EXTREMITIES   TECHNIQUE: Evaluation of both lower extremities were performed at rest, including calculation of  ankle-brachial indices with single level Doppler, pressure and pulse volume recording.   COMPARISON:  None Available.     FINDINGS: Right ABI: 0.62   Left ABI: 0.64   Right Lower Extremity: Abnormal monophasic arterial waveforms at the ankle.   Left Lower Extremity: Abnormal monophasic arterial waveforms at the ankle.   0.5-0.79 Moderate PAD   IMPRESSION: Symmetrically abnormal bilateral ankle-brachial indices consistent with at least moderate underlying peripheral arterial disease. Given the symmetry, aortoiliac disease or bilateral femoropopliteal disease is favored.   Statin:  Yes.   Beta Blocker:  Yes.   Aspirin:  Yes.   ACEI:  No. ARB:  No. CCB use:  No Other antiplatelets/anticoagulants:  No.    ASSESSMENT/PLAN: This is a 72 y.o. female presents to [facility]'s emergency department with worsening pain of her left lower extremity.  Patient states that it feels as if she has loss of blood flow to her foot as she has in the past.  Patient states her leg feels cold all the way through.  Patient's last ABIs are listed above. She endorses she has continued to smoke.  Plan: Vascular surgery will take the patient to the vascular lab tomorrow 03/07/2023 for a left lower extremity angiogram.  Patient was started on a heparin infusion. I discussed in detail with the patient and her brother at the bedside the procedure, benefits, complications, risks.  Both verbalized their understanding and wished to proceed with the procedure as soon as possible.  Patient will be made n.p.o. after midnight.   -I discussed the plan with Dr. Jason Dew MD and he is in agreement with the plan   Nero Sawatzky R Tiff[facility] 03/06/2023 3:27 PM  

## 2023-03-06 NOTE — H&P (Addendum)
History and Physical    Patient: Autumn Arnold ZHY:[id number] DOB: 09/09/1951 DOA: 03/06/2023 DOS: the patient was seen and examined on 03/06/2023 PCP: Marisue Ivan, MD  Patient coming from: Home  Chief Complaint:  Chief Complaint  Patient presents with   Back Pain   Leg Pain    Lower and bilateral leg pain   HPI: Autumn Arnold is a 72 y.o. female with medical history significant of tobacco abuse, coronary artery disease, COPD, history of CVA, peripheral arterial disease, anxiety depression, essential hypertension, dyslipidemia, paroxysmal atrial fibrillation who present to the hospital with complaints of worsening left leg pain for the last 3 to 4 days.  The leg was cold, pulses not palpable.  Vascular surgery has been consulted, planning to bring her to the OR tomorrow.  She is treated with heparin drip.  Patient was also found to have significant hypoxia with ox saturation at 83%, she was placed on 2 L oxygen.  Review of Systems: As mentioned in the history of present illness. All other systems reviewed and are negative. Past Medical History:  Diagnosis Date   Anxiety    Aortic atherosclerosis (HCC)    Arthritis    Asthma    Ataxic gait    a.) requires gait aids for assitance   Atherosclerotic PVD with intermittent claudication (HCC)    a.) s/p LEFT femoral endartarectomy and LEFT iliac stent 04/11/2022   Bilateral carotid artery disease (HCC) 11/07/2015   a.) doppler 11/07/2015: <50 BICA; b.) doppler 06/23/2021: near occ RICA< < 50% LICA; c.) doppler 04/05/2022: occluded RICA, mod plaque LICA   Bilateral lower extremity edema    CAD (coronary artery disease)    Chronic cough    COPD (chronic obstructive pulmonary disease) (HCC)    CVA (cerebral vascular accident) (HCC) 11/07/2015   a.) posterior superior LEFT lenticular nucleus to posterior LEFT corona radiata   DDD (degenerative disc disease), cervical    a.) s/p C5-C6 fusion   DOE (dyspnea on exertion)    GERD  (gastroesophageal reflux disease)    History of bilateral cataract extraction 2017   History of left cataract extraction    Hyperlipidemia    Hypertension    Ischemic cerebrovascular accident (CVA) (HCC) 06/23/2021   a.) superior LEFT frontal and parietal ischemia   Ischemic cerebrovascular accident (CVA) (HCC) 04/04/2022   a.) subcentimeter cortical/subcortical aspect of RIGHT frontoparietal region --> acute to early subacte ischemic infarct   Long term current use of antithrombotics/antiplatelets    a.) DAPT (ASA + clopidogrel)   Lower extremity weakness    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HCC)    a.) s/p PTA and stenting 06/23/2022 --> SMA, mid-infrarenal, BILATERAL CIA; b.) s/p PTA and stenting 09/25/2022: SMA, BILATERAL CIA, infrarenal aorta, LEFT CFA   OSA on CPAP    PAF (paroxysmal atrial fibrillation) (HCC)    Right-sided lacunar infarction (HCC) 02/09/2010   a.) non-hemorrhagic   Small fiber neuropathy    T2DM (type 2 diabetes mellitus) (HCC)    Vitamin D deficiency    Wheezing    Past Surgical History:  Procedure Laterality Date   ABDOMINAL HYSTERECTOMY     ANTERIOR FUSION CERVICAL SPINE     CARPAL TUNNEL RELEASE Bilateral    CATARACT EXTRACTION W/PHACO Left 03/12/2016   Procedure: CATARACT EXTRACTION PHACO AND INTRAOCULAR LENS PLACEMENT (IOC);  Surgeon: Sallee Lange, MD;  Location: [facility];  Service: Ophthalmology;  Laterality: Left;  [place] 01:12 AP% 23.4 CDE 29.83 fluid pack lot # [id number]   CATARACT  EXTRACTION W/PHACO Right 04/16/2016   Procedure: CATARACT EXTRACTION PHACO AND INTRAOCULAR LENS PLACEMENT (IOC);  Surgeon: Sallee Lange, MD;  Location: [facility];  Service: Ophthalmology;  Laterality: Right;  [place] 01:17 AP% 22.5 CDE 35.08 Fluid pack lot # [id number]   CESAREAN SECTION     COLONOSCOPY WITH PROPOFOL N/A 08/12/2018   Procedure: COLONOSCOPY WITH PROPOFOL;  Surgeon: Toledo, Boykin Nearing, MD;  Location: [facility];  Service: Gastroenterology;  Laterality:  N/A;   ELBOW SURGERY     Tendonitis   ENDARTERECTOMY Right 10/04/2022   Procedure: ENDARTERECTOMY CAROTID;  Surgeon: Annice Needy, MD;  Location: [facility];  Service: Vascular;  Laterality: Right;   ENDARTERECTOMY FEMORAL Left 04/11/2022   Procedure: ENDARTERECTOMY FEMORAL;  Surgeon: Annice Needy, MD;  Location: [facility];  Service: Vascular;  Laterality: Left;   ESOPHAGOGASTRODUODENOSCOPY (EGD) WITH PROPOFOL N/A 08/12/2018   Procedure: ESOPHAGOGASTRODUODENOSCOPY (EGD) WITH PROPOFOL;  Surgeon: Toledo, Boykin Nearing, MD;  Location: [facility];  Service: Gastroenterology;  Laterality: N/A;   INCONTINENCE SURGERY     INSERTION OF ILIAC STENT Left 04/11/2022   Procedure: INSERTION OF ILIAC STENT;  Surgeon: Annice Needy, MD;  Location: [facility];  Service: Vascular;  Laterality: Left;   INSERTION OF RETROGRADE CAROTID STENT Right 10/04/2022   Procedure: INSERTION OF RETROGRADE CAROTID STENT;  Surgeon: Annice Needy, MD;  Location: [facility];  Service: Vascular;  Laterality: Right;   LOWER EXTREMITY ANGIOGRAPHY Left 06/23/2021   Procedure: Lower Extremity Angiography;  Surgeon: Renford Dills, MD;  Location: [facility];  Service: Cardiovascular;  Laterality: Left;   LOWER EXTREMITY ANGIOGRAPHY Left 04/05/2022   Procedure: Lower Extremity Angiography;  Surgeon: Annice Needy, MD;  Location: [facility];  Service: Cardiovascular;  Laterality: Left;   LOWER EXTREMITY ANGIOGRAPHY Left 06/25/2022   Procedure: Lower Extremity Angiography;  Surgeon: Annice Needy, MD;  Location: [facility];  Service: Cardiovascular;  Laterality: Left;   LOWER EXTREMITY ANGIOGRAPHY Left 07/11/2022   Procedure: Lower Extremity Angiography;  Surgeon: Annice Needy, MD;  Location: [facility];  Service: Cardiovascular;  Laterality: Left;   TONSILLECTOMY     Social History:  reports that she has been smoking cigarettes. She has been smoking an average of 1 pack per day. She has never used  smokeless tobacco. She reports that she does not currently use alcohol. She reports that she does not use drugs.  Allergies  Allergen Reactions   Lisinopril Other (See Comments)    Tongue burning sensation    Family History  Problem Relation Age of Onset   CAD Mother    Hypertension Mother    Arthritis Mother    Diabetes Father    CAD Father     Prior to Admission medications   Medication Sig Start Date End Date Taking? Authorizing Provider  acetaminophen (TYLENOL) 500 MG tablet Take 1,000 mg by mouth every 8 (eight) hours as needed for mild pain.    [provider]  albuterol (VENTOLIN HFA) 108 (90 Base) MCG/ACT inhaler Inhale 2 puffs into the lungs every 4 (four) hours as needed for wheezing.    [provider]  alendronate (FOSAMAX) 70 MG tablet Take 70 mg by mouth once a week. Take with a full glass of water on an empty stomach.    [provider]  amitriptyline (ELAVIL) 150 MG tablet Take 150 mg by mouth at bedtime.    [provider]  aspirin EC 81 MG EC  tablet Take 1 tablet (81 mg total) by mouth daily. 11/08/15   Enid Baas, MD  atorvastatin (LIPITOR) 80 MG tablet Take 80 mg by mouth daily. 06/13/21   [provider]  busPIRone (BUSPAR) 5 MG tablet Take 5 mg by mouth 2 (two) times daily. 04/18/21   [provider]  Calcium Carb-Cholecalciferol 600-400 MG-UNIT TABS Take 1 tablet by mouth daily.    [provider]  clopidogrel (PLAVIX) 75 MG tablet Take 1 tablet (75 mg total) by mouth daily. 09/24/22   Georgiana Spinner, NP  donepezil (ARICEPT) 5 MG tablet Take 5 mg by mouth at bedtime. 12/14/22   [provider]  gabapentin (NEURONTIN) 600 MG tablet Take 1,200 mg by mouth 2 (two) times daily. Two tablets twice daily 04/18/21   [provider]  metFORMIN (GLUCOPHAGE) 1000 MG tablet Take 1,000 mg by mouth 2 (two) times daily. 06/13/21   [provider]  metoprolol tartrate (LOPRESSOR) 25 MG  tablet Take 0.5 tablets (12.5 mg total) by mouth 2 (two) times daily. 04/13/22   Sunnie Nielsen, DO  montelukast (SINGULAIR) 10 MG tablet Take 10 mg by mouth daily. 06/13/21   [provider]  Multiple Vitamin (MULTIVITAMIN WITH MINERALS) TABS tablet Take 1 tablet by mouth daily. 06/29/21   Enedina Finner, MD  oxyCODONE-acetaminophen (PERCOCET/ROXICET) 5-325 MG tablet Take 1 tablet by mouth every 4 (four) hours as needed for moderate pain. 10/05/22   Pace, Peggye Ley, NP  pantoprazole (PROTONIX) 40 MG tablet Take 40 mg by mouth daily. 01/30/21   [provider]  senna-docusate (SENOKOT-S) 8.6-50 MG tablet Take 2 tablets by mouth at bedtime as needed for mild constipation. Patient not taking: Reported on 09/24/2022 04/13/22   Sunnie Nielsen, DO  sertraline (ZOLOFT) 50 MG tablet Take 3 tablets (150 mg total) by mouth daily. Patient taking differently: Take 150 mg by mouth daily. 1 and 1/2 tablets daily 06/28/21   Enedina Finner, MD  tiZANidine (ZANAFLEX) 4 MG tablet Take 4 mg by mouth 2 (two) times daily. 05/22/21   [provider]  vitamin B-12 (CYANOCOBALAMIN) 1000 MCG tablet Take 1,000 mcg by mouth 3 (three) times a week.    [provider]    Physical Exam: Vitals:   03/06/23 1230 03/06/23 1300 03/06/23 1315 03/06/23 1515  BP: 108/60 117/69    Pulse: (!) 122 (!) 125 (!) 122 (!) 123  Resp:      Temp:      TempSrc:      SpO2: 92% 90% 95% 90%  Weight:      Height:       Physical Exam Constitutional:      General: She is not in acute distress.    Appearance: Normal appearance. She is not ill-appearing.  HENT:     Head: Normocephalic and atraumatic.     Nose: Nose normal. No congestion.     Mouth/Throat:     Mouth: Mucous membranes are moist.     Pharynx: Oropharynx is clear.  Eyes:     Extraocular Movements: Extraocular movements intact.     Conjunctiva/sclera: Conjunctivae normal.     Pupils: Pupils are equal, round, and reactive to light.  Neck:      Vascular: No carotid bruit.  Cardiovascular:     Rate and Rhythm: Regular rhythm. Tachycardia present.     Heart sounds: No murmur heard. Pulmonary:     Effort: Pulmonary effort is normal.     Comments: Decreased breathing sounds. Abdominal:  General: Abdomen is flat. Bowel sounds are normal. There is no distension.     Palpations: Abdomen is soft.     Tenderness: There is no abdominal tenderness.  Musculoskeletal:        General: Normal range of motion.     Cervical back: Normal range of motion and neck supple. No rigidity.     Comments: Left leg red and cold, pedal pulse not palpable.  Lymphadenopathy:     Cervical: No cervical adenopathy.  Skin:    General: Skin is warm and dry.     Coloration: Skin is not jaundiced.  Neurological:     General: No focal deficit present.     Mental Status: She is alert and oriented to person, place, and time.     Cranial Nerves: No cranial nerve deficit.  Psychiatric:        Mood and Affect: Mood normal.        Behavior: Behavior normal.     Data Reviewed:  Mild hyponatremia of 134, renal function normal.  Venous ultrasound did not show DVT, CT angiogram did not show PE.  Assessment and Plan: Left leg ischemia with peripheral arterial disease. Patient previously had multiple stents placed in left lower extremity, she has a worsening ischemia at this time.  Will continue heparin drip, aspirin and atorvastatin.  Patient will be going to the OR tomorrow morning.  Will keep patient n.p.o. tonight.  Chronic hypoxemic respiratory failure.   Obstructive sleep apnea. COPD. Patient had a significant hypoxia when she came to the hospital, this is probably from chronic obstructive sleep apnea and COPD.  Will need nocturnal oxygen. Continue as needed bronchodilators.  Paroxysmal atrial fibrillation. History of stroke. Currently patient is sinus, patient will need anticoagulation at time of discharge.  Dementia without behavioral  disturbance. Continue home medicines.  Tobacco abuse. Advised to quit.    Advance Care Planning:   Code Status: Full Code patient wishes to be full code.  Consults: vascular  Family Communication: Brother at bedside.  Severity of Illness: The appropriate patient status for this patient is INPATIENT. Inpatient status is judged to be reasonable and necessary in order to provide the required intensity of service to ensure the patient's safety. The patient's presenting symptoms, physical exam findings, and initial radiographic and laboratory data in the context of their chronic comorbidities is felt to place them at high risk for further clinical deterioration. Furthermore, it is not anticipated that the patient will be medically stable for discharge from the hospital within 2 midnights of admission.   * I certify that at the point of admission it is my clinical judgment that the patient will require inpatient hospital care spanning beyond 2 midnights from the point of admission due to high intensity of service, high risk for further deterioration and high frequency of surveillance required.*  Author: Marrion Coy, MD 03/06/2023 4:14 PM  For on call review [email or web address].

## 2023-03-07 ENCOUNTER — Encounter
Admission: EM | Disposition: A | Discharge status: Hospice | Payer: Self-pay | Source: Home / Self Care | Attending: Internal Medicine

## 2023-03-07 DIAGNOSIS — I701 Atherosclerosis of renal artery: Secondary | ICD-10-CM | POA: Diagnosis not present

## 2023-03-07 DIAGNOSIS — J9611 Chronic respiratory failure with hypoxia: Secondary | ICD-10-CM

## 2023-03-07 DIAGNOSIS — T82856A Stenosis of peripheral vascular stent, initial encounter: Secondary | ICD-10-CM

## 2023-03-07 DIAGNOSIS — I70223 Atherosclerosis of native arteries of extremities with rest pain, bilateral legs: Secondary | ICD-10-CM | POA: Diagnosis not present

## 2023-03-07 DIAGNOSIS — I700 Atherosclerosis of aorta: Secondary | ICD-10-CM | POA: Diagnosis not present

## 2023-03-07 DIAGNOSIS — J449 Chronic obstructive pulmonary disease, unspecified: Secondary | ICD-10-CM

## 2023-03-07 DIAGNOSIS — Z9889 Other specified postprocedural states: Secondary | ICD-10-CM

## 2023-03-07 DIAGNOSIS — I739 Peripheral vascular disease, unspecified: Secondary | ICD-10-CM | POA: Diagnosis not present

## 2023-03-07 HISTORY — PX: LOWER EXTREMITY ANGIOGRAPHY: CATH118251

## 2023-03-07 LAB — HEPARIN LEVEL (UNFRACTIONATED)
Heparin Unfractionated: 0.16 IU/mL — ABNORMAL LOW (ref 0.30–0.70)
Heparin Unfractionated: 0.51 IU/mL (ref 0.30–0.70)
Heparin Unfractionated: 0.64 IU/mL (ref 0.30–0.70)

## 2023-03-07 LAB — GLUCOSE, CAPILLARY
Glucose-Capillary: 128 mg/dL — ABNORMAL HIGH (ref 70–99)
Glucose-Capillary: 150 mg/dL — ABNORMAL HIGH (ref 70–99)
Glucose-Capillary: 123 mg/dL — ABNORMAL HIGH (ref 70–99)

## 2023-03-07 SURGERY — LOWER EXTREMITY ANGIOGRAPHY
Anesthesia: Moderate Sedation | Laterality: Left

## 2023-03-07 MED ORDER — FENTANYL CITRATE (PF) 100 MCG/2ML IJ SOLN
INTRAMUSCULAR | Status: AC
  Filled 2023-03-07: qty 2

## 2023-03-07 MED ORDER — MIDAZOLAM HCL 5 MG/5ML IJ SOLN
INTRAMUSCULAR | Status: AC
  Filled 2023-03-07: qty 5

## 2023-03-07 MED ORDER — IPRATROPIUM-ALBUTEROL 0.5-2.5 (3) MG/3ML IN SOLN
RESPIRATORY_TRACT | Status: AC
  Filled 2023-03-07: qty 3

## 2023-03-07 MED ORDER — METHYLPREDNISOLONE SODIUM SUCC 125 MG IJ SOLR: 125.0000 mg | Freq: Once | INTRAMUSCULAR | Status: DC | PRN

## 2023-03-07 MED ORDER — INSULIN ASPART 100 UNIT/ML IJ SOLN
0.0000 [IU] | Freq: Three times a day (TID) | INTRAMUSCULAR | Status: DC
  Administered 2023-03-07 – 2023-03-10 (×4): 1 [IU] via SUBCUTANEOUS
  Administered 2023-03-10: 2 [IU] via SUBCUTANEOUS
  Administered 2023-03-11: 1 [IU] via SUBCUTANEOUS
  Filled 2023-03-07 (×5): qty 1

## 2023-03-07 MED ORDER — HEPARIN SODIUM (PORCINE) 1000 UNIT/ML IJ SOLN
INTRAMUSCULAR | Status: AC
  Filled 2023-03-07: qty 10

## 2023-03-07 MED ORDER — CEFAZOLIN SODIUM-DEXTROSE 2-4 GM/100ML-% IV SOLN
INTRAVENOUS | Status: AC
  Filled 2023-03-07: qty 100

## 2023-03-07 MED ORDER — FLUTICASONE FUROATE-VILANTEROL 100-25 MCG/ACT IN AEPB
1.0000 | INHALATION_SPRAY | Freq: Every day | RESPIRATORY_TRACT | Status: DC
  Administered 2023-03-09 – 2023-03-10 (×2): 1 via RESPIRATORY_TRACT
  Filled 2023-03-07: qty 28

## 2023-03-07 MED ORDER — ONDANSETRON HCL 4 MG/2ML IJ SOLN: 4.0000 mg | Freq: Four times a day (QID) | INTRAMUSCULAR | Status: DC | PRN

## 2023-03-07 MED ORDER — DIPHENHYDRAMINE HCL 50 MG/ML IJ SOLN: 50.0000 mg | Freq: Once | INTRAMUSCULAR | Status: DC | PRN

## 2023-03-07 MED ORDER — MIDAZOLAM HCL 2 MG/ML PO SYRP: 8.0000 mg | ORAL_SOLUTION | Freq: Once | ORAL | Status: DC | PRN

## 2023-03-07 MED ORDER — HYDROMORPHONE HCL 1 MG/ML IJ SOLN
1.0000 mg | Freq: Once | INTRAMUSCULAR | Status: AC | PRN
  Administered 2023-03-07: 0.5 mg via INTRAVENOUS

## 2023-03-07 MED ORDER — IPRATROPIUM-ALBUTEROL 0.5-2.5 (3) MG/3ML IN SOLN
3.0000 mL | RESPIRATORY_TRACT | Status: DC | PRN
  Administered 2023-03-07: 3 mL via RESPIRATORY_TRACT

## 2023-03-07 MED ORDER — HEPARIN SODIUM (PORCINE) 1000 UNIT/ML IJ SOLN
INTRAMUSCULAR | Status: DC | PRN
  Administered 2023-03-07: 4000 [IU] via INTRAVENOUS

## 2023-03-07 MED ORDER — HEPARIN BOLUS VIA INFUSION
2000.0000 [IU] | Freq: Once | INTRAVENOUS | Status: AC
  Administered 2023-03-07: 2000 [IU] via INTRAVENOUS
  Filled 2023-03-07: qty 2000

## 2023-03-07 MED ORDER — FENTANYL CITRATE (PF) 100 MCG/2ML IJ SOLN: 12.5000 ug | Freq: Once | INTRAMUSCULAR | Status: DC | PRN

## 2023-03-07 MED ORDER — HYDROMORPHONE HCL 1 MG/ML IJ SOLN
INTRAMUSCULAR | Status: AC
  Filled 2023-03-07: qty 0.5

## 2023-03-07 MED ORDER — FAMOTIDINE 20 MG PO TABS: 40.0000 mg | ORAL_TABLET | Freq: Once | ORAL | Status: DC | PRN

## 2023-03-07 MED ORDER — CEFAZOLIN SODIUM-DEXTROSE 1-4 GM/50ML-% IV SOLN
INTRAVENOUS | Status: DC | PRN
  Administered 2023-03-07: 2 g via INTRAVENOUS

## 2023-03-07 MED ORDER — FENTANYL CITRATE (PF) 100 MCG/2ML IJ SOLN
INTRAMUSCULAR | Status: DC | PRN
  Administered 2023-03-07: 50 ug via INTRAVENOUS

## 2023-03-07 MED ORDER — MIDAZOLAM HCL 2 MG/2ML IJ SOLN
INTRAMUSCULAR | Status: DC | PRN
  Administered 2023-03-07: 1 mg via INTRAVENOUS

## 2023-03-07 MED ORDER — CEFAZOLIN SODIUM-DEXTROSE 2-4 GM/100ML-% IV SOLN: 2.0000 g | INTRAVENOUS | Status: DC

## 2023-03-07 SURGICAL SUPPLY — 12 items
BALLN LUTONIX DCB 6X100X130 (BALLOONS) ×1
BALLOON LUTONIX DCB 6X100X130 (BALLOONS) IMPLANT
CATH ANGIO 5F PIGTAIL 65CM (CATHETERS) IMPLANT
COVER PROBE ULTRASOUND 5X96 (MISCELLANEOUS) IMPLANT
DEVICE STARCLOSE SE CLOSURE (Vascular Products) IMPLANT
GLIDEWIRE ADV .035X180CM (WIRE) IMPLANT
KIT ENCORE 26 ADVANTAGE (KITS) IMPLANT
PACK ANGIOGRAPHY (CUSTOM PROCEDURE TRAY) ×2 IMPLANT
SHEATH BRITE TIP 5FRX11 (SHEATH) IMPLANT
SYR MEDRAD MARK 7 150ML (SYRINGE) IMPLANT
TUBING CONTRAST HIGH PRESS 72 (TUBING) IMPLANT
WIRE GUIDERIGHT .035X150 (WIRE) IMPLANT

## 2023-03-07 NOTE — Care Management Important Message (Signed)
Important Message  Patient Details  Name: Autumn Arnold MRN: [id number] Date of Birth: July 08, 1951   Medicare Important Message Given:  N/A - LOS <3 / Initial given by admissions     Johnell Comings 03/07/2023, 7:11 PM

## 2023-03-07 NOTE — Consult Note (Signed)
ANTICOAGULATION CONSULT NOTE   Pharmacy Consult for heparin infusion Indication:  Vascular occlusion left lower extremity  Allergies  Allergen Reactions   Lisinopril Other (See Comments)    Tongue burning sensation    Patient Measurements: Height: 4\' 11"  (149.9 cm) Weight: 65.3 kg (144 lb) IBW/kg (Calculated) : 43.2 Heparin Dosing Weight: 65.3 kg  Vital Signs: Temp: 97.9 F (36.6 C) (05/02 0759) Temp Source: Oral (05/01 2336) BP: 96/81 (05/02 0759) Pulse Rate: 107 (05/02 0759)  Labs: Recent Labs    03/05/23 1803 03/06/23 0950 03/06/23 1601 03/07/23 0026 03/07/23 0903  HGB 12.6 12.6  --   --   --   HCT 39.2 38.2  --   --   --   PLT 207 188  --   --   --   APTT  --   --  25  --   --   LABPROT  --   --  13.7  --   --   INR  --   --  1.1  --   --   HEPARINUNFRC  --   --   --  0.16* 0.51  CREATININE 0.84 0.73  --   --   --      Estimated Creatinine Clearance: 53.7 mL/min (by C-G formula based on SCr of 0.73 mg/dL).   Medical History: Past Medical History:  Diagnosis Date   Anxiety    Aortic atherosclerosis (HCC)    Arthritis    Asthma    Ataxic gait    a.) requires gait aids for assitance   Atherosclerotic PVD with intermittent claudication (HCC)    a.) s/p LEFT femoral endartarectomy and LEFT iliac stent 04/11/2022   Bilateral carotid artery disease (HCC) 11/07/2015   a.) doppler 11/07/2015: <50 BICA; b.) doppler 06/23/2021: near occ RICA< < 50% LICA; c.) doppler 04/05/2022: occluded RICA, mod plaque LICA   Bilateral lower extremity edema    CAD (coronary artery disease)    Chronic cough    COPD (chronic obstructive pulmonary disease) (HCC)    CVA (cerebral vascular accident) (HCC) 11/07/2015   a.) posterior superior LEFT lenticular nucleus to posterior LEFT corona radiata   DDD (degenerative disc disease), cervical    a.) s/p C5-C6 fusion   DOE (dyspnea on exertion)    GERD (gastroesophageal reflux disease)    History of bilateral cataract extraction  2017   History of left cataract extraction    Hyperlipidemia    Hypertension    Ischemic cerebrovascular accident (CVA) (HCC) 06/23/2021   a.) superior LEFT frontal and parietal ischemia   Ischemic cerebrovascular accident (CVA) (HCC) 04/04/2022   a.) subcentimeter cortical/subcortical aspect of RIGHT frontoparietal region --> acute to early subacte ischemic infarct   Long term current use of antithrombotics/antiplatelets    a.) DAPT (ASA + clopidogrel)   Lower extremity weakness    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HCC)    a.) s/p PTA and stenting 06/23/2022 --> SMA, mid-infrarenal, BILATERAL CIA; b.) s/p PTA and stenting 09/25/2022: SMA, BILATERAL CIA, infrarenal aorta, LEFT CFA   OSA on CPAP    PAF (paroxysmal atrial fibrillation) (HCC)    Right-sided lacunar infarction (HCC) 02/09/2010   a.) non-hemorrhagic   Small fiber neuropathy    T2DM (type 2 diabetes mellitus) (HCC)    Vitamin D deficiency    Wheezing     Medications:  No prior anticoagulation noted  Assessment: 72 y.o. female presents to the ED via ambulance with complaint of left leg pain for approximately 3 weeks.  PMH of peripheral vascular disease. Concern for ischemic LLE. Pharmacy has been consulted to initiate and manage IV heparin therapy.    Goal of Therapy:  Heparin level 0.3-0.7 units/ml Monitor platelets by anticoagulation protocol: Yes  05/02 0026 HL 0.16, subtherapeutic 05/02 0903 HL 0.51, therapeutic x 1  Plan:  Heparin level therapeutic x 1 Continue heparin infusion at 1300 units/hr Recheck HL in 6 hours to confirm Daily CBC while on heparin  Barrie Folk, PharmD 03/07/2023 9:35 AM

## 2023-03-07 NOTE — Op Note (Signed)
[facility]  Percutaneous Study/Intervention Procedural Note   Date of Surgery: 03/07/2023  Surgeon(s):Samary Shatz    Assistants:none  Pre-operative Diagnosis: PAD with rest pain BLE  Post-operative diagnosis:  Same  Procedure(s) Performed:             1.  Ultrasound guidance for vascular access right femoral artery             2.  Catheter placement into aorta from right femoral approach             3.  Aortogram and selective left lower extremity angiogram             4.  Percutaneous transluminal angioplasty of right common iliac artery and aorta with 2 inflations with a 6 mm diameter by 10 cm length Lutonix drug-coated angioplasty balloon             5.  StarClose closure device right femoral artery  EBL: 5 cc  Contrast: 75 cc  Fluoro Time: 2.7 minutes  Moderate Conscious Sedation Time: approximately 33 minutes using 1 mg of Versed and 50 mcg of Fentanyl              Indications:  Patient is a 72 y.o.female with profound bilateral lower extremity ischemia with rest pain symptoms.  She has undergone extensive revascularization bilaterally in the past.  She has no pedal pulses present on either side. The patient is brought in for angiography for further evaluation and potential treatment.  Due to the limb threatening nature of the situation, angiogram was performed for attempted limb salvage. The patient is aware that if the procedure fails, amputation would be expected.  The patient also understands that even with successful revascularization, amputation may still be required due to the severity of the situation.  Risks and benefits are discussed and informed consent is obtained.   Procedure:  The patient was identified and appropriate procedural time out was performed.  The patient was then placed supine on the table and prepped and draped in the usual sterile fashion. Moderate conscious sedation was administered during a face to face encounter with the patient  throughout the procedure with my supervision of the RN administering medicines and monitoring the patient's vital signs, pulse oximetry, telemetry and mental status throughout from the start of the procedure until the patient was taken to the recovery room. Ultrasound was used to evaluate the right common femoral artery.  It was patent but small.  A digital ultrasound image was acquired.  A Seldinger needle was used to access the right common femoral artery under direct ultrasound guidance and a permanent image was performed.  A 0.035 J wire was advanced without resistance and a 5Fr sheath was placed.  Pigtail catheter was placed into the aorta and an AP aortogram was performed. The right renal artery had a high-grade stenosis of greater than 85%, the left renal artery appeared to have moderate stenosis in the 60% range.  The aorta and iliac arteries had been reconstructed with multiple stents previously.  There was very high-grade stenosis in the aorta and right common iliac artery stents of greater than 90%.  The right external iliac artery was small but not focally stenotic.  The left common and external iliac arteries and common femoral arteries were all occluded.  With the pigtail catheter in the aorta, we attempted to image the left lower extremity.  Selective left lower extremity angiogram was then performed. This demonstrated only a small amount of reconstitution  of the profunda femoris artery with no flow distally.  The common femoral artery, SFA, and all tibial vessels were all occluded or at least flow could not be demonstrated in these vessels.  This was an extremely difficult situation.  Her reconstructed aorta and iliac arteries from stents would not allow and up and over approach to treat the left leg.  I elected to address the aorta and the right iliac artery today from the right femoral approach.  Patient was given intravenous heparin.  An advantage wire was placed and the pigtail catheter was  removed.  I then used a 6 mm diameter by 10 cm length Lutonix drug-coated angioplasty balloon.  This was inflated first in the right common iliac artery within the previously placed stents and taken up to 8 atm for 1 minute.  Was then advanced up into the aortic portion and taken up to 10 atm for 1 minute.  This showed marked improvement with her very small aorta now having less than 20% residual stenosis in the right iliac stents having less than 10% residual stenosis.  Again, I did not feel there was much we could do for her left lower extremity today.  I will have discussions with her family and I think she will ultimately require an amputation although I am very worried about her wound healing potential due to the lack of blood flow.  We may have to consider a radial approach as I do not see another reasonable way to treat the left lower extremity.  I elected to terminate the procedure. The sheath was removed and StarClose closure device was deployed in the right femoral artery with excellent hemostatic result. The patient was taken to the recovery room in stable condition having tolerated the procedure well.  Findings:               Aortogram: The right renal artery had a high-grade stenosis of greater than 85%, the left renal artery appeared to have moderate stenosis in the 60% range.  The aorta and iliac arteries had been reconstructed with multiple stents previously.  There was very high-grade stenosis in the aorta and right common iliac artery stents of greater than 90%.  The right external iliac artery was small but not focally stenotic.  The left common and external iliac arteries and common femoral arteries were all occluded.             Left lower Extremity:  This demonstrated only a small amount of reconstitution of the profunda femoris artery with no flow distally.  The common femoral artery, SFA, and all tibial vessels were all occluded or at least flow could not be demonstrated in these  vessels.   Disposition: Patient was taken to the recovery room in stable condition having tolerated the procedure well.  Complications: None  Festus Barren 03/07/2023 1:16 PM   This note was created with Dragon Medical transcription system. Any errors in dictation are purely unintentional.

## 2023-03-07 NOTE — Hospital Course (Addendum)
Autumn Arnold is a 72 y.o. female with medical history significant of tobacco abuse, coronary artery disease, COPD, history of CVA, peripheral arterial disease, anxiety depression, essential hypertension, dyslipidemia, paroxysmal atrial fibrillation who present to the hospital with complaints of worsening left leg pain for the last 3 to 4 days.  Seen by vascular surgery, angiogram was performed on 5/2.  Due to extensive peripheral arterial diseases, surgery is planned.

## 2023-03-07 NOTE — Progress Notes (Addendum)
  Progress Note   Patient: Autumn Arnold:[id number] DOB: 1951/08/23 DOA: 03/06/2023     1 DOS: the patient was seen and examined on 03/07/2023   Brief hospital course: Autumn Arnold is a 72 y.o. female with medical history significant of tobacco abuse, coronary artery disease, COPD, history of CVA, peripheral arterial disease, anxiety depression, essential hypertension, dyslipidemia, paroxysmal atrial fibrillation who present to the hospital with complaints of worsening left leg pain for the last 3 to 4 days.  Seen by vascular surgery, surgical intervention 5/2.   Principal Problem:   PAD (peripheral artery disease) (HCC) Active Problems:   Type 2 diabetes mellitus with hyperlipidemia (HCC)   OSA (obstructive sleep apnea)   Tobacco abuse   COPD (chronic obstructive pulmonary disease) (HCC)   Dementia (HCC)   History of CVA (cerebrovascular accident)   Critical limb ischemia of left lower extremity with ulceration of lower leg (HCC)   Hyponatremia   Overweight (BMI 25.0-29.9)   Chronic hypoxemic respiratory failure (HCC)   Arterial occlusion, lower extremity (HCC)   Assessment and Plan:  Left leg ischemia with peripheral arterial disease. Patient previously had multiple stents placed in left lower extremity, she has a worsening ischemia at this time. Surgery with angiogram and possible intervention today.   Nocturnal hypoxia. Obstructive sleep apnea. COPD. On nocturnal oxygen, started scheduled bronchodilators. Hypoxia most likely secondary to obstructive sleep apnea.   Paroxysmal atrial fibrillation. History of stroke. Currently patient is sinus, patient will need anticoagulation at time of discharge.   Dementia without behavioral disturbance. Continue home medicines.   Tobacco abuse. Advised to quit.     Subjective:  Patient slept well last night, no complaint this morning.  Pain under control.  Physical Exam: Vitals:   03/07/23 1258 03/07/23 1303 03/07/23 1312  03/07/23 1315  BP: 129/67 129/86 127/62 110/88  Pulse: (!) 128 (!) 129 (!) 128 (!) 129  Resp: (!) 29 (!) 30 (!) 27 (!) 23  Temp:      TempSrc:      SpO2: 93% 90% 94% 95%  Weight:      Height:       General exam: Appears calm and comfortable  Respiratory system: Clear to auscultation. Respiratory effort normal. Cardiovascular system: Regular and tachycardic.Marland Kitchen No JVD, murmurs, rubs, gallops or clicks. No pedal edema. Gastrointestinal system: Abdomen is nondistended, soft and nontender. No organomegaly or masses felt. Normal bowel sounds heard. Central nervous system: Alert and oriented. No focal neurological deficits. Extremities: Symmetric 5 x 5 power. Skin: No rashes, lesions or ulcers Psychiatry: Judgement and insight appear normal. Mood & affect appropriate.    Data Reviewed:  Lab results reviewed.  Family Communication: None  Disposition: Status is: Inpatient Remains inpatient appropriate because: Severity of disease, inpatient procedure, IV treatment.     Time spent: 35 minutes  Author: Marrion Coy, MD 03/07/2023 1:29 PM  For on call review [email or web address].

## 2023-03-07 NOTE — TOC CM/SW Note (Signed)
Went by room for readmission prevention screen but patient is off the unit. Will try again later.  Charlynn Court, CSW [phone]

## 2023-03-07 NOTE — Consult Note (Signed)
ANTICOAGULATION CONSULT NOTE   Pharmacy Consult for heparin infusion Indication:  Vascular occlusion left lower extremity  Allergies  Allergen Reactions   Lisinopril Other (See Comments)    Tongue burning sensation    Patient Measurements: Height: 4\' 11"  (149.9 cm) Weight: 65.3 kg (144 lb) IBW/kg (Calculated) : 43.2 Heparin Dosing Weight: 65.3 kg  Vital Signs: Temp: 98.3 F (36.8 C) (05/02 2052) Temp Source: Oral (05/02 2052) BP: 100/74 (05/02 2052) Pulse Rate: 108 (05/02 2052)  Labs: Recent Labs    03/05/23 1803 03/06/23 0950 03/06/23 1601 03/07/23 0026 03/07/23 0903 03/07/23 2128  HGB 12.6 12.6  --   --   --   --   HCT 39.2 38.2  --   --   --   --   PLT 207 188  --   --   --   --   APTT  --   --  25  --   --   --   LABPROT  --   --  13.7  --   --   --   INR  --   --  1.1  --   --   --   HEPARINUNFRC  --   --   --  0.16* 0.51 0.64  CREATININE 0.84 0.73  --   --   --   --      Estimated Creatinine Clearance: 53.7 mL/min (by C-G formula based on SCr of 0.73 mg/dL).   Medical History: Past Medical History:  Diagnosis Date   Anxiety    Aortic atherosclerosis (HCC)    Arthritis    Asthma    Ataxic gait    a.) requires gait aids for assitance   Atherosclerotic PVD with intermittent claudication (HCC)    a.) s/p LEFT femoral endartarectomy and LEFT iliac stent 04/11/2022   Bilateral carotid artery disease (HCC) 11/07/2015   a.) doppler 11/07/2015: <50 BICA; b.) doppler 06/23/2021: near occ RICA< < 50% LICA; c.) doppler 04/05/2022: occluded RICA, mod plaque LICA   Bilateral lower extremity edema    CAD (coronary artery disease)    Chronic cough    COPD (chronic obstructive pulmonary disease) (HCC)    CVA (cerebral vascular accident) (HCC) 11/07/2015   a.) posterior superior LEFT lenticular nucleus to posterior LEFT corona radiata   DDD (degenerative disc disease), cervical    a.) s/p C5-C6 fusion   DOE (dyspnea on exertion)    GERD (gastroesophageal  reflux disease)    History of bilateral cataract extraction 2017   History of left cataract extraction    Hyperlipidemia    Hypertension    Ischemic cerebrovascular accident (CVA) (HCC) 06/23/2021   a.) superior LEFT frontal and parietal ischemia   Ischemic cerebrovascular accident (CVA) (HCC) 04/04/2022   a.) subcentimeter cortical/subcortical aspect of RIGHT frontoparietal region --> acute to early subacte ischemic infarct   Long term current use of antithrombotics/antiplatelets    a.) DAPT (ASA + clopidogrel)   Lower extremity weakness    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HCC)    a.) s/p PTA and stenting 06/23/2022 --> SMA, mid-infrarenal, BILATERAL CIA; b.) s/p PTA and stenting 09/25/2022: SMA, BILATERAL CIA, infrarenal aorta, LEFT CFA   OSA on CPAP    PAF (paroxysmal atrial fibrillation) (HCC)    Right-sided lacunar infarction (HCC) 02/09/2010   a.) non-hemorrhagic   Small fiber neuropathy    T2DM (type 2 diabetes mellitus) (HCC)    Vitamin D deficiency    Wheezing     Medications:  No  prior anticoagulation noted  Assessment: 72 y.o. female presents to the ED via ambulance with complaint of left leg pain for approximately 3 weeks. PMH of peripheral vascular disease. Concern for ischemic LLE. Pharmacy has been consulted to initiate and manage IV heparin therapy.    Goal of Therapy:  Heparin level 0.3-0.7 units/ml Monitor platelets by anticoagulation protocol: Yes  05/02 0026 HL 0.16, subtherapeutic 05/02 0903 HL 0.51, therapeutic x 1 05/02 2128 HL 0.64, therapeutic x 2  Plan:  Heparin level therapeutic x 2 Continue heparin infusion at 1300 units/hr Recheck HL daily with AM labs Daily CBC while on heparin  Selinda Eon, PharmD 03/07/2023 9:55 PM

## 2023-03-07 NOTE — Consult Note (Signed)
ANTICOAGULATION CONSULT NOTE   Pharmacy Consult for heparin infusion Indication:  Vascular occlusion left lower extremity  Allergies  Allergen Reactions   Lisinopril Other (See Comments)    Tongue burning sensation    Patient Measurements: Height: 4\' 11"  (149.9 cm) Weight: 65.3 kg (144 lb) IBW/kg (Calculated) : 43.2 Heparin Dosing Weight: 65.3 kg  Vital Signs: Temp: 97.6 F (36.4 C) (05/01 2336) Temp Source: Oral (05/01 2336) BP: 107/65 (05/01 2336) Pulse Rate: 103 (05/01 2336)  Labs: Recent Labs    03/05/23 1803 03/06/23 0950 03/06/23 1601 03/07/23 0026  HGB 12.6 12.6  --   --   HCT 39.2 38.2  --   --   PLT 207 188  --   --   APTT  --   --  25  --   LABPROT  --   --  13.7  --   INR  --   --  1.1  --   HEPARINUNFRC  --   --   --  0.16*  CREATININE 0.84 0.73  --   --      Estimated Creatinine Clearance: 53.7 mL/min (by C-G formula based on SCr of 0.73 mg/dL).   Medical History: Past Medical History:  Diagnosis Date   Anxiety    Aortic atherosclerosis (HCC)    Arthritis    Asthma    Ataxic gait    a.) requires gait aids for assitance   Atherosclerotic PVD with intermittent claudication (HCC)    a.) s/p LEFT femoral endartarectomy and LEFT iliac stent 04/11/2022   Bilateral carotid artery disease (HCC) 11/07/2015   a.) doppler 11/07/2015: <50 BICA; b.) doppler 06/23/2021: near occ RICA< < 50% LICA; c.) doppler 04/05/2022: occluded RICA, mod plaque LICA   Bilateral lower extremity edema    CAD (coronary artery disease)    Chronic cough    COPD (chronic obstructive pulmonary disease) (HCC)    CVA (cerebral vascular accident) (HCC) 11/07/2015   a.) posterior superior LEFT lenticular nucleus to posterior LEFT corona radiata   DDD (degenerative disc disease), cervical    a.) s/p C5-C6 fusion   DOE (dyspnea on exertion)    GERD (gastroesophageal reflux disease)    History of bilateral cataract extraction 2017   History of left cataract extraction     Hyperlipidemia    Hypertension    Ischemic cerebrovascular accident (CVA) (HCC) 06/23/2021   a.) superior LEFT frontal and parietal ischemia   Ischemic cerebrovascular accident (CVA) (HCC) 04/04/2022   a.) subcentimeter cortical/subcortical aspect of RIGHT frontoparietal region --> acute to early subacte ischemic infarct   Long term current use of antithrombotics/antiplatelets    a.) DAPT (ASA + clopidogrel)   Lower extremity weakness    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HCC)    a.) s/p PTA and stenting 06/23/2022 --> SMA, mid-infrarenal, BILATERAL CIA; b.) s/p PTA and stenting 09/25/2022: SMA, BILATERAL CIA, infrarenal aorta, LEFT CFA   OSA on CPAP    PAF (paroxysmal atrial fibrillation) (HCC)    Right-sided lacunar infarction (HCC) 02/09/2010   a.) non-hemorrhagic   Small fiber neuropathy    T2DM (type 2 diabetes mellitus) (HCC)    Vitamin D deficiency    Wheezing     Medications:  No prior anticoagulation noted  Assessment: 72 y.o. female presents to the ED via ambulance with complaint of left leg pain for approximately 3 weeks. PMH of peripheral vascular disease. Concern for ischemic LLE. Pharmacy has been consulted to initiate and manage IV heparin therapy.    Goal  of Therapy:  Heparin level 0.3-0.7 units/ml Monitor platelets by anticoagulation protocol: Yes  05/02 0026 HL 0.16, subtherapeutic  Plan:  Bolus 2000 units x 1 Increase heparin infusion to 1300 units/hr Recheck HL in 6 hrs after rate change CBC daily while on heparin.  Otelia Sergeant, PharmD, [facility] 03/07/2023 1:37 AM

## 2023-03-07 NOTE — Plan of Care (Signed)

## 2023-03-07 NOTE — Interval H&P Note (Signed)
History and Physical Interval Note:  03/07/2023 10:39 AM  [name]  has presented today for surgery, with the diagnosis of PAD.  The various methods of treatment have been discussed with the patient and family. After consideration of risks, benefits and other options for treatment, the patient has consented to  Procedure(s): Lower Extremity Angiography (Left) as a surgical intervention.  The patient's history has been reviewed, patient examined, no change in status, stable for surgery.  I have reviewed the patient's chart and labs.  Questions were answered to the patient's satisfaction.     Autumn Arnold

## 2023-03-08 ENCOUNTER — Encounter: Payer: Self-pay | Admitting: Vascular Surgery

## 2023-03-08 DIAGNOSIS — I739 Peripheral vascular disease, unspecified: Secondary | ICD-10-CM | POA: Diagnosis not present

## 2023-03-08 DIAGNOSIS — D696 Thrombocytopenia, unspecified: Secondary | ICD-10-CM | POA: Insufficient documentation

## 2023-03-08 DIAGNOSIS — E785 Hyperlipidemia, unspecified: Secondary | ICD-10-CM

## 2023-03-08 DIAGNOSIS — E1169 Type 2 diabetes mellitus with other specified complication: Secondary | ICD-10-CM

## 2023-03-08 DIAGNOSIS — G4734 Idiopathic sleep related nonobstructive alveolar hypoventilation: Secondary | ICD-10-CM | POA: Insufficient documentation

## 2023-03-08 DIAGNOSIS — J9611 Chronic respiratory failure with hypoxia: Secondary | ICD-10-CM | POA: Diagnosis not present

## 2023-03-08 LAB — CBC
HCT: 26.3 % — ABNORMAL LOW (ref 36.0–46.0)
Hemoglobin: 8.5 g/dL — ABNORMAL LOW (ref 12.0–15.0)
MCH: 28.3 pg (ref 26.0–34.0)
MCHC: 32.3 g/dL (ref 30.0–36.0)
MCV: 87.7 fL (ref 80.0–100.0)
Platelets: 127 10*3/uL — ABNORMAL LOW (ref 150–400)
RBC: 3 MIL/uL — ABNORMAL LOW (ref 3.87–5.11)
RDW: 16.5 % — ABNORMAL HIGH (ref 11.5–15.5)
WBC: 7.4 10*3/uL (ref 4.0–10.5)
nRBC: 0 % (ref 0.0–0.2)

## 2023-03-08 LAB — HEMOGLOBIN: Hemoglobin: 9.4 g/dL — ABNORMAL LOW (ref 12.0–15.0)

## 2023-03-08 LAB — GLUCOSE, CAPILLARY
Glucose-Capillary: 133 mg/dL — ABNORMAL HIGH (ref 70–99)
Glucose-Capillary: 110 mg/dL — ABNORMAL HIGH (ref 70–99)
Glucose-Capillary: 121 mg/dL — ABNORMAL HIGH (ref 70–99)
Glucose-Capillary: 149 mg/dL — ABNORMAL HIGH (ref 70–99)
Glucose-Capillary: 132 mg/dL — ABNORMAL HIGH (ref 70–99)

## 2023-03-08 LAB — HEPARIN LEVEL (UNFRACTIONATED): Heparin Unfractionated: 0.61 IU/mL (ref 0.30–0.70)

## 2023-03-08 MED ORDER — HYDROMORPHONE HCL 1 MG/ML IJ SOLN
0.5000 mg | INTRAMUSCULAR | Status: DC | PRN
  Administered 2023-03-08 – 2023-03-11 (×6): 0.5 mg via INTRAVENOUS
  Filled 2023-03-08 (×6): qty 0.5

## 2023-03-08 MED ORDER — IPRATROPIUM-ALBUTEROL 0.5-2.5 (3) MG/3ML IN SOLN
3.0000 mL | Freq: Three times a day (TID) | RESPIRATORY_TRACT | Status: DC
  Administered 2023-03-08 – 2023-03-11 (×10): 3 mL via RESPIRATORY_TRACT
  Filled 2023-03-08 (×10): qty 3

## 2023-03-08 MED ORDER — METOPROLOL TARTRATE 25 MG PO TABS
25.0000 mg | ORAL_TABLET | Freq: Two times a day (BID) | ORAL | Status: DC
  Administered 2023-03-08 – 2023-03-11 (×7): 25 mg via ORAL
  Filled 2023-03-08 (×7): qty 1

## 2023-03-08 NOTE — Consult Note (Signed)
CARDIOLOGY CONSULT NOTE               Patient ID: Autumn Arnold MRN: [id number] DOB/AGE: 70-Aug-1952 70 y.o.  Admit date: 03/06/2023 Referring Physician Dr. Chipper Herb Dr. Wyn Quaker Primary Physician Dr. Burnadette Pop primary Primary Cardiologist [facility] Reason for Consultation preop clearance cardiac evaluation  HPI: Patient is a 72 year old female multiple medical problems tobacco abuse severe peripheral vascular disease critical limb ischemia coronary disease COPD previous CVA anxiety depression hyperlipidemia hypertension diabetes paroxysmal atrial fibrillation presented with worsening lower extremity ischemia and pain patient has extensive disease and is planned for extensive peripheral vascular disease with Dr. Wyn Quaker and needed cardiac evaluation and clearance prior to the procedure patient denies chest pain shortness of breath but has significant chronic leg pain at rest  Review of systems complete and found to be negative unless listed above     Past Medical History:  Diagnosis Date   Anxiety    Aortic atherosclerosis (HCC)    Arthritis    Asthma    Ataxic gait    a.) requires gait aids for assitance   Atherosclerotic PVD with intermittent claudication (HCC)    a.) s/p LEFT femoral endartarectomy and LEFT iliac stent 04/11/2022   Bilateral carotid artery disease (HCC) 11/07/2015   a.) doppler 11/07/2015: <50 BICA; b.) doppler 06/23/2021: near occ RICA< < 50% LICA; c.) doppler 04/05/2022: occluded RICA, mod plaque LICA   Bilateral lower extremity edema    CAD (coronary artery disease)    Chronic cough    COPD (chronic obstructive pulmonary disease) (HCC)    CVA (cerebral vascular accident) (HCC) 11/07/2015   a.) posterior superior LEFT lenticular nucleus to posterior LEFT corona radiata   DDD (degenerative disc disease), cervical    a.) s/p C5-C6 fusion   DOE (dyspnea on exertion)    GERD (gastroesophageal reflux disease)    History of bilateral cataract extraction 2017   History  of left cataract extraction    Hyperlipidemia    Hypertension    Ischemic cerebrovascular accident (CVA) (HCC) 06/23/2021   a.) superior LEFT frontal and parietal ischemia   Ischemic cerebrovascular accident (CVA) (HCC) 04/04/2022   a.) subcentimeter cortical/subcortical aspect of RIGHT frontoparietal region --> acute to early subacte ischemic infarct   Long term current use of antithrombotics/antiplatelets    a.) DAPT (ASA + clopidogrel)   Lower extremity weakness    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HCC)    a.) s/p PTA and stenting 06/23/2022 --> SMA, mid-infrarenal, BILATERAL CIA; b.) s/p PTA and stenting 09/25/2022: SMA, BILATERAL CIA, infrarenal aorta, LEFT CFA   OSA on CPAP    PAF (paroxysmal atrial fibrillation) (HCC)    Right-sided lacunar infarction (HCC) 02/09/2010   a.) non-hemorrhagic   Small fiber neuropathy    T2DM (type 2 diabetes mellitus) (HCC)    Vitamin D deficiency    Wheezing     Past Surgical History:  Procedure Laterality Date   ABDOMINAL HYSTERECTOMY     ANTERIOR FUSION CERVICAL SPINE     CARPAL TUNNEL RELEASE Bilateral    CATARACT EXTRACTION W/PHACO Left 03/12/2016   Procedure: CATARACT EXTRACTION PHACO AND INTRAOCULAR LENS PLACEMENT (IOC);  Surgeon: Sallee Lange, MD;  Location: [facility];  Service: Ophthalmology;  Laterality: Left;  [place] 01:12 AP% 23.4 CDE 29.83 fluid pack lot # [id number]   CATARACT EXTRACTION W/PHACO Right 04/16/2016   Procedure: CATARACT EXTRACTION PHACO AND INTRAOCULAR LENS PLACEMENT (IOC);  Surgeon: Sallee Lange, MD;  Location: [facility];  Service: Ophthalmology;  Laterality: Right;  [place]  01:17 AP% 22.5 CDE 35.08 Fluid pack lot # [id number]   CESAREAN SECTION     COLONOSCOPY WITH PROPOFOL N/A 08/12/2018   Procedure: COLONOSCOPY WITH PROPOFOL;  Surgeon: Toledo, Boykin Nearing, MD;  Location: [facility];  Service: Gastroenterology;  Laterality: N/A;   ELBOW SURGERY     Tendonitis   ENDARTERECTOMY Right 10/04/2022   Procedure:  ENDARTERECTOMY CAROTID;  Surgeon: Annice Needy, MD;  Location: [facility];  Service: Vascular;  Laterality: Right;   ENDARTERECTOMY FEMORAL Left 04/11/2022   Procedure: ENDARTERECTOMY FEMORAL;  Surgeon: Annice Needy, MD;  Location: [facility];  Service: Vascular;  Laterality: Left;   ESOPHAGOGASTRODUODENOSCOPY (EGD) WITH PROPOFOL N/A 08/12/2018   Procedure: ESOPHAGOGASTRODUODENOSCOPY (EGD) WITH PROPOFOL;  Surgeon: Toledo, Boykin Nearing, MD;  Location: [facility];  Service: Gastroenterology;  Laterality: N/A;   INCONTINENCE SURGERY     INSERTION OF ILIAC STENT Left 04/11/2022   Procedure: INSERTION OF ILIAC STENT;  Surgeon: Annice Needy, MD;  Location: [facility];  Service: Vascular;  Laterality: Left;   INSERTION OF RETROGRADE CAROTID STENT Right 10/04/2022   Procedure: INSERTION OF RETROGRADE CAROTID STENT;  Surgeon: Annice Needy, MD;  Location: [facility];  Service: Vascular;  Laterality: Right;   LOWER EXTREMITY ANGIOGRAPHY Left 06/23/2021   Procedure: Lower Extremity Angiography;  Surgeon: Renford Dills, MD;  Location: [facility];  Service: Cardiovascular;  Laterality: Left;   LOWER EXTREMITY ANGIOGRAPHY Left 04/05/2022   Procedure: Lower Extremity Angiography;  Surgeon: Annice Needy, MD;  Location: [facility];  Service: Cardiovascular;  Laterality: Left;   LOWER EXTREMITY ANGIOGRAPHY Left 06/25/2022   Procedure: Lower Extremity Angiography;  Surgeon: Annice Needy, MD;  Location: [facility];  Service: Cardiovascular;  Laterality: Left;   LOWER EXTREMITY ANGIOGRAPHY Left 07/11/2022   Procedure: Lower Extremity Angiography;  Surgeon: Annice Needy, MD;  Location: [facility];  Service: Cardiovascular;  Laterality: Left;   LOWER EXTREMITY ANGIOGRAPHY Left 03/07/2023   Procedure: Lower Extremity Angiography;  Surgeon: Annice Needy, MD;  Location: [facility];  Service: Cardiovascular;  Laterality: Left;   TONSILLECTOMY      Medications Prior to  Admission  Medication Sig Dispense Refill Last Dose   acetaminophen (TYLENOL) 500 MG tablet Take 1,000 mg by mouth every 8 (eight) hours as needed for mild pain.   prn at unk   albuterol (VENTOLIN HFA) 108 (90 Base) MCG/ACT inhaler Inhale 2 puffs into the lungs every 4 (four) hours as needed for wheezing.   prn at unk   alendronate (FOSAMAX) 70 MG tablet Take 70 mg by mouth once a week. Take with a full glass of water on an empty stomach.   unk at unk   amitriptyline (ELAVIL) 150 MG tablet Take 150 mg by mouth at bedtime.   03/05/2023   aspirin EC 81 MG EC tablet Take 1 tablet (81 mg total) by mouth daily. 30 tablet 2 03/05/2023   busPIRone (BUSPAR) 5 MG tablet Take 5 mg by mouth 2 (two) times daily.   03/05/2023   Calcium Carb-Cholecalciferol 600-400 MG-UNIT TABS Take 1 tablet by mouth daily.   03/05/2023   clopidogrel (PLAVIX) 75 MG tablet Take 1 tablet (75 mg total) by mouth daily. 30 tablet 2 03/05/2023 at am   donepezil (ARICEPT) 5 MG tablet Take 5 mg by mouth at bedtime.   03/05/2023   gabapentin (NEURONTIN) 600 MG tablet Take 600 mg by mouth 4 (four) times daily. Two  tablets twice daily   03/05/2023   glipiZIDE (GLUCOTROL) 10 MG tablet Take 20 mg by mouth 2 (two) times daily before a meal.   03/05/2023   metFORMIN (GLUCOPHAGE) 1000 MG tablet Take 1,000 mg by mouth 2 (two) times daily.   03/05/2023   metoprolol tartrate (LOPRESSOR) 25 MG tablet Take 0.5 tablets (12.5 mg total) by mouth 2 (two) times daily. 60 tablet 0 03/05/2023   montelukast (SINGULAIR) 10 MG tablet Take 10 mg by mouth daily.   03/05/2023   Multiple Vitamin (MULTIVITAMIN WITH MINERALS) TABS tablet Take 1 tablet by mouth daily. 30 tablet 0 03/05/2023   oxyCODONE-acetaminophen (PERCOCET/ROXICET) 5-325 MG tablet Take 1 tablet by mouth every 4 (four) hours as needed for moderate pain. 30 tablet 0 prn at unk   pantoprazole (PROTONIX) 40 MG tablet Take 40 mg by mouth daily.   03/05/2023   rosuvastatin (CRESTOR) 40 MG tablet Take 1 tablet by  mouth daily.   03/05/2023   senna-docusate (SENOKOT-S) 8.6-50 MG tablet Take 2 tablets by mouth at bedtime as needed for mild constipation.   prn at unk   sertraline (ZOLOFT) 50 MG tablet Take 3 tablets (150 mg total) by mouth daily. (Patient taking differently: Take 150 mg by mouth daily. 1 and 1/2 tablets daily) 90 tablet 0 03/05/2023   tiZANidine (ZANAFLEX) 4 MG tablet Take 4 mg by mouth 2 (two) times daily.   03/05/2023   vitamin B-12 (CYANOCOBALAMIN) 1000 MCG tablet Take 1,000 mcg by mouth 3 (three) times a week.   Past Week   atorvastatin (LIPITOR) 80 MG tablet Take 80 mg by mouth daily. (Patient not taking: Reported on 03/06/2023)   Not Taking at unk   Social History   Socioeconomic History   Marital status: Married    Spouse name: Maisie Fus   Number of children: 2   Years of education: Not on file   Highest education level: Not on file  Occupational History   Not on file  Tobacco Use   Smoking status: Every Day    Packs/day: 1    Types: Cigarettes   Smokeless tobacco: Never  Vaping Use   Vaping Use: Never used  Substance and Sexual Activity   Alcohol use: Not Currently    Comment: occ   Drug use: No   Sexual activity: Not on file  Other Topics Concern   Not on file  Social History Narrative   Lives at home with husband; sister Claris Gladden lives in the area    Social Determinants of Health   Financial Resource Strain: Not on file  Food Insecurity: No Food Insecurity (03/06/2023)   Hunger Vital Sign    Worried About Running Out of Food in the Last Year: Never true    Ran Out of Food in the Last Year: Never true  Transportation Needs: No Transportation Needs (03/06/2023)   PRAPARE - Administrator, Civil Service (Medical): No    Lack of Transportation (Non-Medical): No  Physical Activity: Not on file  Stress: Not on file  Social Connections: Not on file  Intimate Partner Violence: Patient Unable To Answer (03/06/2023)   Humiliation, Afraid, Rape, and Kick questionnaire     Fear of Current or Ex-Partner: Patient unable to answer    Emotionally Abused: Patient unable to answer    Physically Abused: Patient unable to answer    Sexually Abused: Patient unable to answer    Family History  Problem Relation Age of Onset   CAD Mother  Hypertension Mother    Arthritis Mother    Diabetes Father    CAD Father       Review of systems complete and found to be negative unless listed above      PHYSICAL EXAM  General: Well developed, well nourished, in no acute distress HEENT:  Normocephalic and atramatic Neck:  No JVD.  Lungs: Clear bilaterally to auscultation and percussion. Heart: HRRR . Normal S1 and S2 without gallops or murmurs.  Abdomen: Bowel sounds are positive, abdomen soft and non-tender  Msk:  Back normal, normal gait. Normal strength and tone for age. Extremities: No clubbing, cyanosis or edema.   Neuro: Alert and oriented X 3. Psych:  Good affect, responds appropriately  Labs:   Lab Results  Component Value Date   WBC 7.4 03/08/2023   HGB 8.5 (L) 03/08/2023   HCT 26.3 (L) 03/08/2023   MCV 87.7 03/08/2023   PLT 127 (L) 03/08/2023    Recent Labs  Lab 03/06/23 0950  NA 134*  K 3.8  CL 98  CO2 23  BUN 12  CREATININE 0.73  CALCIUM 9.0  PROT 7.4  BILITOT 1.2  ALKPHOS 88  ALT 17  AST 49*  GLUCOSE 181*   Lab Results  Component Value Date   CKTOTAL 838 (H) 01/09/2023   TROPONINI 0.03 11/07/2015    Lab Results  Component Value Date   CHOL 149 04/04/2022   CHOL 250 (H) 06/23/2021   CHOL 305 (H) 11/08/2015   Lab Results  Component Value Date   HDL 38 (L) 04/04/2022   HDL 38 (L) 06/23/2021   HDL 36 (L) 11/08/2015   Lab Results  Component Value Date   LDLCALC 64 04/04/2022   LDLCALC 164 (H) 06/23/2021   LDLCALC 206 (H) 11/08/2015   Lab Results  Component Value Date   TRIG 236 (H) 04/04/2022   TRIG 242 (H) 06/23/2021   TRIG 314 (H) 11/08/2015   Lab Results  Component Value Date   CHOLHDL 3.9 04/04/2022    CHOLHDL 6.6 06/23/2021   CHOLHDL 8.5 11/08/2015   No results found for: "LDLDIRECT"    Radiology: PERIPHERAL VASCULAR CATHETERIZATION  Result Date: 03/07/2023 See surgical note for result.  CT Angio Chest PE W and/or Wo Contrast  Result Date: 03/06/2023 CLINICAL DATA:  Tachycardia. Concern for pulmonary embolism. Leg pain. EXAM: CT ANGIOGRAPHY CHEST WITH CONTRAST TECHNIQUE: Multidetector CT imaging of the chest was performed using the standard protocol during bolus administration of intravenous contrast. Multiplanar CT image reconstructions and MIPs were obtained to evaluate the vascular anatomy. RADIATION DOSE REDUCTION: This exam was performed according to the departmental dose-optimization program which includes automated exposure control, adjustment of the mA and/or kV according to patient size and/or use of iterative reconstruction technique. CONTRAST:  75mL OMNIPAQUE IOHEXOL 350 MG/ML SOLN COMPARISON:  None Available. FINDINGS: Cardiovascular: No filling defects within the pulmonary arteries to suggest acute pulmonary embolism. Coronary artery calcification and aortic atherosclerotic calcification. RIGHT internal carotid stent noted. Mediastinum/Nodes: No axillary or supraclavicular adenopathy. No mediastinal or hilar adenopathy. No pericardial fluid. Esophagus normal. Lungs/Pleura: No pulmonary infarction. There is mild interlobular septal thickening. No pneumonia. No pleural fluid. No pneumothorax Upper Abdomen: Limited view of the liver, kidneys, pancreas are unremarkable. Normal adrenal glands. Musculoskeletal: Review of the MIP images confirms the above findings. IMPRESSION: 1. No evidence acute pulmonary embolism. 2. Mild interstitial edema. 3.  Aortic Atherosclerosis (ICD10-I70.0). Electronically Signed   By: Genevive Bi M.D.   On: 03/06/2023 14:12  US Venous Img Lower Unilateral Left (DVT)  Result Date: 03/06/2023 CLINICAL DATA:  Left lower extremity pain. History of smoking. Evaluate  for DVT. EXAM: LEFT LOWER EXTREMITY VENOUS DOPPLER ULTRASOUND TECHNIQUE: Gray-scale sonography with graded compression, as well as color Doppler and duplex ultrasound were performed to evaluate the lower extremity deep venous systems from the level of the common femoral vein and including the common femoral, femoral, profunda femoral, popliteal and calf veins including the posterior tibial, peroneal and gastrocnemius veins when visible. The superficial great saphenous vein was also interrogated. Spectral Doppler was utilized to evaluate flow at rest and with distal augmentation maneuvers in the common femoral, femoral and popliteal veins. COMPARISON:  None Available. FINDINGS: Contralateral Common Femoral Vein: Respiratory phasicity is normal and symmetric with the symptomatic side. No evidence of thrombus. Normal compressibility. Common Femoral Vein: No evidence of thrombus. Normal compressibility, respiratory phasicity and response to augmentation. Saphenofemoral Junction: No evidence of thrombus. Normal compressibility and flow on color Doppler imaging. Profunda Femoral Vein: No evidence of thrombus. Normal compressibility and flow on color Doppler imaging. Femoral Vein: No evidence of thrombus. Normal compressibility, respiratory phasicity and response to augmentation. Popliteal Vein: No evidence of thrombus. Normal compressibility, respiratory phasicity and response to augmentation. Calf Veins: No evidence of thrombus. Normal compressibility and flow on color Doppler imaging. Superficial Great Saphenous Vein: No evidence of thrombus. Normal compressibility. Other Findings: Note is made of a mildly prominent though non pathologically enlarged left lymph node which is not enlarged by size criteria measuring 0.5 cm in greatest short axis diameter maintains hilum (image 35). Note is made arterial stent within the left popliteal artery). IMPRESSION: No evidence of DVT within the left lower extremity Electronically  Signed   By: Simonne Come M.D.   On: 03/06/2023 11:58   DG Chest 2 View  Result Date: 03/05/2023 CLINICAL DATA:  Shortness of breath. EXAM: CHEST - 2 VIEW COMPARISON:  Chest radiographs 01/08/2023, 04/30/2022 FINDINGS: Cardiac silhouette and mediastinal contours within normal limits. Moderate calcification is again seen within aortic arch. Stenting of the right common carotid artery is again noted. There is interstitial thickening of the inferior medial right lung which appears mildly improved from 01/08/2023 but again new from 04/30/2022. The left lung is clear. No pleural effusion or pneumothorax. Mild-to-moderate multilevel degenerative disc changes of the thoracic spine. IMPRESSION: Interstitial thickening of the inferior medial right lung appears mildly improved from 01/08/2023 but again new from 04/30/2022. This may represent improving pneumonitis or chronic scarring, such as the sequela of prior infection. Electronically Signed   By: Neita Garnet M.D.   On: 03/05/2023 18:48    EKG: Sinus tach rate of around 100 diffuse ST depression anteriorly nonspecific findings  ASSESSMENT AND PLAN:  Preop for major vascular surgery COPD Peripheral vascular disease Paroxysmal atrial fibrillation Hyperlipidemia Hypertension Tobacco abuse Coronary artery disease Chronic hypoxic respiratory failure Critical limb ischemia Sleep apnea Diabetes type 2 . Plan Preoperative clearance for critical limb procedure by vascular at a moderate risk Increase metoprolol preop for surgery Maintain adequate hydration Continue statin therapy with Lipitor Continue heparin for anticoagulation for peripheral vascular disease as well as atrial fibrillation transition to Eliquis upon discharge Advised patient refrain from tobacco abuse Continue metformin therapy for diabetes management Inhalers as necessary for COPD Continue aspirin Plavix will need to be held by vascular surgeon as necessary Will resume high-level  anticoagulation with Eliquis upon discharge  Signed: Alwyn Pea MD 03/08/2023, 2:29 PM

## 2023-03-08 NOTE — Progress Notes (Signed)
  Progress Note   Patient: Autumn Arnold ZOX:[id number] DOB: May 06, 1951 DOA: 03/06/2023     2 DOS: the patient was seen and examined on 03/08/2023   Brief hospital course: Autumn Arnold is a 72 y.o. female with medical history significant of tobacco abuse, coronary artery disease, COPD, history of CVA, peripheral arterial disease, anxiety depression, essential hypertension, dyslipidemia, paroxysmal atrial fibrillation who present to the hospital with complaints of worsening left leg pain for the last 3 to 4 days.  Seen by vascular surgery, angiogram was performed on 5/2.  Due to extensive peripheral arterial diseases, surgery is planned.   Principal Problem:   PAD (peripheral artery disease) (HCC) Active Problems:   Type 2 diabetes mellitus with hyperlipidemia (HCC)   OSA (obstructive sleep apnea)   Tobacco abuse   Acute postoperative anemia due to expected blood loss   COPD (chronic obstructive pulmonary disease) (HCC)   Dementia (HCC)   History of CVA (cerebrovascular accident)   Critical limb ischemia of left lower extremity with ulceration of lower leg (HCC)   Hyponatremia   Overweight (BMI 25.0-29.9)   Chronic hypoxemic respiratory failure (HCC)   Arterial occlusion, lower extremity (HCC)   Thrombocytopenia (HCC)   Assessment and Plan:  Left leg ischemia with peripheral arterial disease. Patient previously had multiple stents placed in left lower extremity, she has a worsening ischemia at this time. Status post angiogram, pending definitive surgery. Continue heparin drip.   Chronic hypoxemic respiratory failure.   Obstructive sleep apnea. COPD. Continue nocturnal oxygen, scheduled DuoNeb.    Paroxysmal atrial fibrillation ruled out History of stroke. Reviewed previous records, patient never been diagnosed with atrial fibrillation in the past. Continue antiplatelet treatment and Lipitor.   Dementia without behavioral disturbance. Continue home medicines.   Tobacco  abuse. Advised to quit.       Subjective:  Patient still has some pain in the leg.  Shortness of breath with exertion, cough.  Physical Exam: Vitals:   03/07/23 1536 03/07/23 2052 03/08/23 0426 03/08/23 0933  BP: (!) 146/122 100/74 101/65 121/62  Pulse: (!) 117 (!) 108 88 (!) 111  Resp: 18  20   Temp: 99.3 F (37.4 C) 98.3 F (36.8 C) 97.6 F (36.4 C)   TempSrc:  Oral Oral   SpO2: 93% 95% 100% 96%  Weight:      Height:       General exam: Appears calm and comfortable  Respiratory system: Decreased breathing sounds with rhonchi. Respiratory effort normal. Cardiovascular system: S1 & S2 heard, RRR. No JVD, murmurs, rubs, gallops or clicks. No pedal edema. Gastrointestinal system: Abdomen is nondistended, soft and nontender. No organomegaly or masses felt. Normal bowel sounds heard. Central nervous system: Alert and oriented. No focal neurological deficits. Extremities: Left leg still cold. Skin: No rashes, lesions or ulcers Psychiatry: Judgement and insight appear normal. Mood & affect appropriate.    Data Reviewed:  Lab results reviewed.  Family Communication: Daughter was updated at bedside.  Disposition: Status is: Inpatient Remains inpatient appropriate because: Severity of disease, inpatient procedure, IV treatment.     Time spent: 35 minutes  Author: Marrion Coy, MD 03/08/2023 1:09 PM  For on call review [email or web address].

## 2023-03-08 NOTE — Consult Note (Signed)
ANTICOAGULATION CONSULT NOTE   Pharmacy Consult for heparin infusion Indication:  Vascular occlusion left lower extremity  Allergies  Allergen Reactions   Lisinopril Other (See Comments)    Tongue burning sensation    Patient Measurements: Height: 4\' 11"  (149.9 cm) Weight: 65.3 kg (144 lb) IBW/kg (Calculated) : 43.2 Heparin Dosing Weight: 65.3 kg  Vital Signs: Temp: 97.6 F (36.4 C) (05/03 0426) Temp Source: Oral (05/03 0426) BP: 101/65 (05/03 0426) Pulse Rate: 88 (05/03 0426)  Labs: Recent Labs    03/05/23 1803 03/06/23 0950 03/06/23 1601 03/07/23 0026 03/07/23 0903 03/07/23 2128 03/08/23 0438  HGB 12.6 12.6  --   --   --   --   --   HCT 39.2 38.2  --   --   --   --   --   PLT 207 188  --   --   --   --   --   APTT  --   --  25  --   --   --   --   LABPROT  --   --  13.7  --   --   --   --   INR  --   --  1.1  --   --   --   --   HEPARINUNFRC  --   --   --    < > 0.51 0.64 0.61  CREATININE 0.84 0.73  --   --   --   --   --    < > = values in this interval not displayed.     Estimated Creatinine Clearance: 53.7 mL/min (by C-G formula based on SCr of 0.73 mg/dL).   Medical History: Past Medical History:  Diagnosis Date   Anxiety    Aortic atherosclerosis (HCC)    Arthritis    Asthma    Ataxic gait    a.) requires gait aids for assitance   Atherosclerotic PVD with intermittent claudication (HCC)    a.) s/p LEFT femoral endartarectomy and LEFT iliac stent 04/11/2022   Bilateral carotid artery disease (HCC) 11/07/2015   a.) doppler 11/07/2015: <50 BICA; b.) doppler 06/23/2021: near occ RICA< < 50% LICA; c.) doppler 04/05/2022: occluded RICA, mod plaque LICA   Bilateral lower extremity edema    CAD (coronary artery disease)    Chronic cough    COPD (chronic obstructive pulmonary disease) (HCC)    CVA (cerebral vascular accident) (HCC) 11/07/2015   a.) posterior superior LEFT lenticular nucleus to posterior LEFT corona radiata   DDD (degenerative disc  disease), cervical    a.) s/p C5-C6 fusion   DOE (dyspnea on exertion)    GERD (gastroesophageal reflux disease)    History of bilateral cataract extraction 2017   History of left cataract extraction    Hyperlipidemia    Hypertension    Ischemic cerebrovascular accident (CVA) (HCC) 06/23/2021   a.) superior LEFT frontal and parietal ischemia   Ischemic cerebrovascular accident (CVA) (HCC) 04/04/2022   a.) subcentimeter cortical/subcortical aspect of RIGHT frontoparietal region --> acute to early subacte ischemic infarct   Long term current use of antithrombotics/antiplatelets    a.) DAPT (ASA + clopidogrel)   Lower extremity weakness    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HCC)    a.) s/p PTA and stenting 06/23/2022 --> SMA, mid-infrarenal, BILATERAL CIA; b.) s/p PTA and stenting 09/25/2022: SMA, BILATERAL CIA, infrarenal aorta, LEFT CFA   OSA on CPAP    PAF (paroxysmal atrial fibrillation) (HCC)    Right-sided lacunar  infarction (HCC) 02/09/2010   a.) non-hemorrhagic   Small fiber neuropathy    T2DM (type 2 diabetes mellitus) (HCC)    Vitamin D deficiency    Wheezing     Medications:  No prior anticoagulation noted  Assessment: 72 y.o. female presents to the ED via ambulance with complaint of left leg pain for approximately 3 weeks. PMH of peripheral vascular disease. Concern for ischemic LLE. Pharmacy has been consulted to initiate and manage IV heparin therapy.    Goal of Therapy:  Heparin level 0.3-0.7 units/ml Monitor platelets by anticoagulation protocol: Yes  05/02 0026 HL 0.16, subtherapeutic 05/02 0903 HL 0.51, therapeutic x 1 05/02 2128 HL 0.64, therapeutic x 2 05/03 0438 HL 0.61, therapeutic x 3  Plan:  Heparin level therapeutic x 3 Continue heparin infusion at 1300 units/hr Recheck HL daily with AM labs Daily CBC while on heparin  Otelia Sergeant, PharmD, [facility] 03/08/2023 5:37 AM

## 2023-03-08 NOTE — Progress Notes (Signed)
  Progress Note    03/08/2023 3:37 PM 1 Day Post-Op  Subjective:  Autumn Arnold is a 72 y.o. female with medical history significant of tobacco abuse, coronary artery disease, COPD, history of CVA, peripheral arterial disease, anxiety depression, essential hypertension, dyslipidemia, paroxysmal atrial fibrillation who present to the hospital with complaints of worsening left leg pain for the last 3 to 4 days.  Seen by vascular surgery, angiogram was performed on 5/2.  Due to extensive peripheral arterial diseases, surgery is planned for Monday 03/11/2023.    Vitals:   03/08/23 0426 03/08/23 0933  BP: 101/65 121/62  Pulse: 88 (!) 111  Resp: 20   Temp: 97.6 F (36.4 C)   SpO2: 100% 96%   Physical Exam: Cardiac:  RRR, normal S1 and S2 Lungs: Clear on auscultation throughout.  Positive rhonchi smoker's cough. Incisions: Right groin incision with dressing clean dry and intact. Extremities: Left lower extremity cold to touch, no palpable DP/PT pulses.  Right lower extremity has palpable pulses but are very weak Abdomen: Positive bowel sounds all 4 quadrants, soft, nondistended nontender Neurologic: Alert and oriented x 3, answers all questions appropriately.  CBC    Component Value Date/Time   WBC 7.4 03/08/2023 0438   RBC 3.00 (L) 03/08/2023 0438   HGB 9.4 (L) 03/08/2023 1442   HCT 26.3 (L) 03/08/2023 0438   PLT 127 (L) 03/08/2023 0438   MCV 87.7 03/08/2023 0438   MCH 28.3 03/08/2023 0438   MCHC 32.3 03/08/2023 0438   RDW 16.5 (H) 03/08/2023 0438   LYMPHSABS 1.8 03/06/2023 0950   MONOABS 0.7 03/06/2023 0950   EOSABS 0.0 03/06/2023 0950   BASOSABS 0.0 03/06/2023 0950    BMET    Component Value Date/Time   NA 134 (L) 03/06/2023 0950   K 3.8 03/06/2023 0950   CL 98 03/06/2023 0950   CO2 23 03/06/2023 0950   GLUCOSE 181 (H) 03/06/2023 0950   BUN 12 03/06/2023 0950   CREATININE 0.73 03/06/2023 0950   CALCIUM 9.0 03/06/2023 0950   GFRNONAA >60 03/06/2023 0950   GFRAA >60  11/08/2015 0419    INR    Component Value Date/Time   INR 1.1 03/06/2023 1601     Intake/Output Summary (Last 24 hours) at 03/08/2023 1537 Last data filed at 03/08/2023 1040 Gross per 24 hour  Intake 480 ml  Output --  Net 480 ml     Assessment/Plan:  72 y.o. female is s/p aortogram with left lower extremity angiogram with angioplasty of the right common iliac artery and aorta. 1 Day Post-Op   PLAN: Vascular surgery plans on taking the patient to the operating room on Monday, 03/11/2023 for a left lower extremity above-the-knee amputation.  I discussed in detail with the patient the surgery, the benefits, complications, and risks.  Patient verbalizes her understanding.  Patient would like to proceed with surgery.  Patient will be made n.p.o. after midnight on Sunday, 03/10/2023.  DVT prophylaxis: ASA 81 mg daily, Plavix 75 mg daily and heparin infusion.   Marcie [facility] 03/08/2023 3:37 PM

## 2023-03-09 DIAGNOSIS — G4734 Idiopathic sleep related nonobstructive alveolar hypoventilation: Secondary | ICD-10-CM | POA: Diagnosis not present

## 2023-03-09 DIAGNOSIS — D62 Acute posthemorrhagic anemia: Secondary | ICD-10-CM | POA: Diagnosis not present

## 2023-03-09 DIAGNOSIS — I739 Peripheral vascular disease, unspecified: Secondary | ICD-10-CM | POA: Diagnosis not present

## 2023-03-09 DIAGNOSIS — Z01810 Encounter for preprocedural cardiovascular examination: Secondary | ICD-10-CM

## 2023-03-09 LAB — GLUCOSE, CAPILLARY
Glucose-Capillary: 126 mg/dL — ABNORMAL HIGH (ref 70–99)
Glucose-Capillary: 155 mg/dL — ABNORMAL HIGH (ref 70–99)
Glucose-Capillary: 150 mg/dL — ABNORMAL HIGH (ref 70–99)

## 2023-03-09 LAB — CBC
HCT: 29.4 % — ABNORMAL LOW (ref 36.0–46.0)
Hemoglobin: 9.5 g/dL — ABNORMAL LOW (ref 12.0–15.0)
MCH: 28.5 pg (ref 26.0–34.0)
MCHC: 32.3 g/dL (ref 30.0–36.0)
MCV: 88.3 fL (ref 80.0–100.0)
Platelets: 145 10*3/uL — ABNORMAL LOW (ref 150–400)
RBC: 3.33 MIL/uL — ABNORMAL LOW (ref 3.87–5.11)
RDW: 16.2 % — ABNORMAL HIGH (ref 11.5–15.5)
WBC: 7.9 10*3/uL (ref 4.0–10.5)
nRBC: 0 % (ref 0.0–0.2)

## 2023-03-09 LAB — BASIC METABOLIC PANEL
Anion gap: 8 (ref 5–15)
BUN: 15 mg/dL (ref 8–23)
CO2: 21 mmol/L — ABNORMAL LOW (ref 22–32)
Calcium: 8.3 mg/dL — ABNORMAL LOW (ref 8.9–10.3)
Chloride: 103 mmol/L (ref 98–111)
Creatinine, Ser: 0.74 mg/dL (ref 0.44–1.00)
GFR, Estimated: >60 mL/min (ref >60)
Glucose, Bld: 155 mg/dL — ABNORMAL HIGH (ref 70–99)
Potassium: 3.7 mmol/L (ref 3.5–5.1)
Sodium: 132 mmol/L — ABNORMAL LOW (ref 135–145)

## 2023-03-09 LAB — MAGNESIUM: Magnesium: 1.9 mg/dL (ref 1.7–2.4)

## 2023-03-09 LAB — HEPARIN LEVEL (UNFRACTIONATED): Heparin Unfractionated: 0.34 IU/mL (ref 0.30–0.70)

## 2023-03-09 NOTE — Consult Note (Signed)
ANTICOAGULATION CONSULT NOTE   Pharmacy Consult for heparin infusion Indication:  Vascular occlusion left lower extremity  Allergies  Allergen Reactions   Lisinopril Other (See Comments)    Tongue burning sensation    Patient Measurements: Height: 4\' 11"  (149.9 cm) Weight: 65.3 kg (144 lb) IBW/kg (Calculated) : 43.2 Heparin Dosing Weight: 65.3 kg  Vital Signs: Temp: 98.2 F (36.8 C) (05/04 0434) Temp Source: Oral (05/04 0434) BP: 115/65 (05/04 0434) Pulse Rate: 108 (05/04 0434)  Labs: Recent Labs    03/06/23 0950 03/06/23 1601 03/07/23 0026 03/07/23 2128 03/08/23 0438 03/08/23 1442 03/09/23 0514  HGB 12.6  --   --   --  8.5* 9.4* 9.5*  HCT 38.2  --   --   --  26.3*  --  29.4*  PLT 188  --   --   --  127*  --  145*  APTT  --  25  --   --   --   --   --   LABPROT  --  13.7  --   --   --   --   --   INR  --  1.1  --   --   --   --   --   HEPARINUNFRC  --   --    < > 0.64 0.61  --  0.34  CREATININE 0.73  --   --   --   --   --  0.74   < > = values in this interval not displayed.     Estimated Creatinine Clearance: 53.7 mL/min (by C-G formula based on SCr of 0.74 mg/dL).   Medical History: Past Medical History:  Diagnosis Date   Anxiety    Aortic atherosclerosis (HCC)    Arthritis    Asthma    Ataxic gait    a.) requires gait aids for assitance   Atherosclerotic PVD with intermittent claudication (HCC)    a.) s/p LEFT femoral endartarectomy and LEFT iliac stent 04/11/2022   Bilateral carotid artery disease (HCC) 11/07/2015   a.) doppler 11/07/2015: <50 BICA; b.) doppler 06/23/2021: near occ RICA< < 50% LICA; c.) doppler 04/05/2022: occluded RICA, mod plaque LICA   Bilateral lower extremity edema    CAD (coronary artery disease)    Chronic cough    COPD (chronic obstructive pulmonary disease) (HCC)    CVA (cerebral vascular accident) (HCC) 11/07/2015   a.) posterior superior LEFT lenticular nucleus to posterior LEFT corona radiata   DDD (degenerative disc  disease), cervical    a.) s/p C5-C6 fusion   DOE (dyspnea on exertion)    GERD (gastroesophageal reflux disease)    History of bilateral cataract extraction 2017   History of left cataract extraction    Hyperlipidemia    Hypertension    Ischemic cerebrovascular accident (CVA) (HCC) 06/23/2021   a.) superior LEFT frontal and parietal ischemia   Ischemic cerebrovascular accident (CVA) (HCC) 04/04/2022   a.) subcentimeter cortical/subcortical aspect of RIGHT frontoparietal region --> acute to early subacte ischemic infarct   Long term current use of antithrombotics/antiplatelets    a.) DAPT (ASA + clopidogrel)   Lower extremity weakness    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HCC)    a.) s/p PTA and stenting 06/23/2022 --> SMA, mid-infrarenal, BILATERAL CIA; b.) s/p PTA and stenting 09/25/2022: SMA, BILATERAL CIA, infrarenal aorta, LEFT CFA   OSA on CPAP    PAF (paroxysmal atrial fibrillation) (HCC)    Right-sided lacunar infarction (HCC) 02/09/2010   a.) non-hemorrhagic  Small fiber neuropathy    T2DM (type 2 diabetes mellitus) (HCC)    Vitamin D deficiency    Wheezing     Medications:  No prior anticoagulation noted  Assessment: 72 y.o. female presents to the ED via ambulance with complaint of left leg pain for approximately 3 weeks. PMH of peripheral vascular disease. Concern for ischemic LLE. Pharmacy has been consulted to initiate and manage IV heparin therapy.    Goal of Therapy:  Heparin level 0.3-0.7 units/ml Monitor platelets by anticoagulation protocol: Yes  05/02 0026 HL 0.16, subtherapeutic 05/02 0903 HL 0.51, therapeutic x 1 05/02 2128 HL 0.64, therapeutic x 2 05/03 0438 HL 0.61, therapeutic x 3 05/04 0514 HL 0.34, therapeutic x 4  Plan:  Heparin level therapeutic x 4 Continue heparin infusion at 1300 units/hr Recheck HL daily with AM labs Daily CBC while on heparin  Otelia Sergeant, PharmD, [facility] 03/09/2023 6:01 AM

## 2023-03-09 NOTE — Progress Notes (Signed)
SUBJECTIVE: Patient is sitting in bed complaining of pain in her left lower leg.  Patient denies any chest pain or shortness of breath or palpitation   Vitals:   03/08/23 2041 03/09/23 0434 03/09/23 0454 03/09/23 0736  BP: 107/65 115/65    Pulse: (!) 109 (!) 108    Resp: 18 16    Temp: 97.8 F (36.6 C) 98.2 F (36.8 C)    TempSrc: Oral Oral    SpO2: 99% 95% 95% (!) 87%  Weight:      Height:        Intake/Output Summary (Last 24 hours) at 03/09/2023 1019 Last data filed at 03/09/2023 0413 Gross per 24 hour  Intake 993.76 ml  Output --  Net 993.76 ml    LABS: Basic Metabolic Panel: Recent Labs    03/09/23 0514  NA 132*  K 3.7  CL 103  CO2 21*  GLUCOSE 155*  BUN 15  CREATININE 0.74  CALCIUM 8.3*  MG 1.9   Liver Function Tests: No results for input(s): "AST", "ALT", "ALKPHOS", "BILITOT", "PROT", "ALBUMIN" in the last 72 hours. No results for input(s): "LIPASE", "AMYLASE" in the last 72 hours. CBC: Recent Labs    03/08/23 0438 03/08/23 1442 03/09/23 0514  WBC 7.4  --  7.9  HGB 8.5* 9.4* 9.5*  HCT 26.3*  --  29.4*  MCV 87.7  --  88.3  PLT 127*  --  145*   Cardiac Enzymes: No results for input(s): "CKTOTAL", "CKMB", "CKMBINDEX", "TROPONINI" in the last 72 hours. BNP: Invalid input(s): "POCBNP" D-Dimer: No results for input(s): "DDIMER" in the last 72 hours. Hemoglobin A1C: No results for input(s): "HGBA1C" in the last 72 hours. Fasting Lipid Panel: No results for input(s): "CHOL", "HDL", "LDLCALC", "TRIG", "CHOLHDL", "LDLDIRECT" in the last 72 hours. Thyroid Function Tests: No results for input(s): "TSH", "T4TOTAL", "T3FREE", "THYROIDAB" in the last 72 hours.  Invalid input(s): "FREET3" Anemia Panel: No results for input(s): "VITAMINB12", "FOLATE", "FERRITIN", "TIBC", "IRON", "RETICCTPCT" in the last 72 hours.   PHYSICAL EXAM General: Well developed, well nourished, in no acute distress HEENT:  Normocephalic and atramatic Neck:  No JVD.  Lungs: Clear  bilaterally to auscultation and percussion. Heart: HRRR . Normal S1 and S2 without gallops or murmurs.  Abdomen: Bowel sounds are positive, abdomen soft and non-tender  Msk:  Back normal, normal gait. Normal strength and tone for age. Extremities: No clubbing, cyanosis or edema.   Neuro: Alert and oriented X 3. Psych:  Good affect, responds appropriately  TELEMETRY: Not on monitor right now  ASSESSMENT AND PLAN: Severe peripheral vascular disease with arterial occlusion of the left lower extremity requiring above-knee amputation on Monday.  Patient denies any chest pain shortness of breath orthopnea PND.  Patient is moderate risk for surgery.  Advised heparin perioperatively and convert to Eliquis after surgery.  Advised stop smoking.  Will continue to follow the patient closely.   ICD-10-CM   1. Arterial occlusion, lower extremity (HCC)  I70.209       Principal Problem:   PAD (peripheral artery disease) (HCC) Active Problems:   OSA (obstructive sleep apnea)   Type 2 diabetes mellitus with hyperlipidemia (HCC)   Metabolic acidosis   Tobacco abuse   Acute postoperative anemia due to expected blood loss   COPD (chronic obstructive pulmonary disease) (HCC)   Dementia (HCC)   History of CVA (cerebrovascular accident)   Critical limb ischemia of left lower extremity with ulceration of lower leg (HCC)   Hyponatremia   Overweight (  BMI 25.0-29.9)   Arterial occlusion, lower extremity (HCC)   Thrombocytopenia (HCC)   Nocturnal hypoxia    Adrian Blackwater, MD, [facility] 03/09/2023 10:19 AM

## 2023-03-09 NOTE — Progress Notes (Signed)
  Progress Note   Patient: Autumn Arnold:[id number] DOB: [birth year] DOA: 03/06/2023     3 DOS: the patient was seen and examined on 03/09/2023   Brief hospital course: Autumn Arnold is a 71 y.o. female with medical history significant of tobacco abuse, coronary artery disease, COPD, history of CVA, peripheral arterial disease, anxiety depression, essential hypertension, dyslipidemia, paroxysmal atrial fibrillation who present to the hospital with complaints of worsening left leg pain for the last 3 to 4 days.  Seen by vascular surgery, angiogram was performed on 5/2.  Due to extensive peripheral arterial diseases, AKA is planned on 5/6.   Principal Problem:   PAD (peripheral artery disease) (HCC) Active Problems:   Type 2 diabetes mellitus with hyperlipidemia (HCC)   OSA (obstructive sleep apnea)   Metabolic acidosis   Tobacco abuse   Acute postoperative anemia due to expected blood loss   COPD (chronic obstructive pulmonary disease) (HCC)   Dementia (HCC)   History of CVA (cerebrovascular accident)   Critical limb ischemia of left lower extremity with ulceration of lower leg (HCC)   Hyponatremia   Overweight (BMI 25.0-29.9)   Arterial occlusion, lower extremity (HCC)   Thrombocytopenia (HCC)   Nocturnal hypoxia   Assessment and Plan:  Left leg ischemia with peripheral arterial disease. Patient previously had multiple stents placed in left lower extremity, she has a worsening ischemia at this time. Angiogram showed diffuse vascular disease, patient still on heparin drip.  Planning for AKA on Monday. Continue symptomatic treatment.   Obstructive sleep apnea with nocturnal hypoxia. COPD. Patient does not tolerate the CPAP, continue nocturnal oxygen as needed.  Continue bronchodilator.  Condition stable, no exacerbation.   Acute blood loss anemia. Thrombocytopenia. Hemoglobin is getting better, platelets  also getting better.  Paroxysmal atrial fibrillation ruled out History  of stroke. Reviewed previous records, patient never been diagnosed with atrial fibrillation in the past. Continue antiplatelet treatment and Lipitor.   Dementia without behavioral disturbance. Continue home medicines.   Tobacco abuse. Advised to quit.       Subjective:  Patient still complaining of intermittent leg pain, receiving pain medicine.  Short of breath at baseline.  Physical Exam: Vitals:   03/08/23 2041 03/09/23 0434 03/09/23 0454 03/09/23 0736  BP: 107/65 115/65    Pulse: (!) 109 (!) 108    Resp: 18 16    Temp: 97.8 F (36.6 C) 98.2 F (36.8 C)    TempSrc: Oral Oral    SpO2: 99% 95% 95% (!) 87%  Weight:      Height:       General exam: Appears calm and comfortable  Respiratory system: Decreased breathing sounds. Respiratory effort normal. Cardiovascular system: S1 & S2 heard, RRR. No JVD, murmurs, rubs, gallops or clicks. No pedal edema. Gastrointestinal system: Abdomen is nondistended, soft and nontender. No organomegaly or masses felt. Normal bowel sounds heard. Central nervous system: Alert and oriented. No focal neurological deficits. Extremities: Left leg feels cold. Skin: No rashes, lesions or ulcers Psychiatry: Judgement and insight appear normal. Mood & affect appropriate.    Data Reviewed:  Lab results reviewed.  Family Communication: Daughter updated at bedside.  Disposition: Status is: Inpatient Remains inpatient appropriate because: Severity of disease, IV treatment, inpatient procedure.     Time spent: 35 minutes  Author: Marrion Coy, MD 03/09/2023 11:42 AM  For on call review [email or web address].

## 2023-03-10 DIAGNOSIS — I739 Peripheral vascular disease, unspecified: Secondary | ICD-10-CM | POA: Diagnosis not present

## 2023-03-10 DIAGNOSIS — Z01810 Encounter for preprocedural cardiovascular examination: Secondary | ICD-10-CM | POA: Diagnosis not present

## 2023-03-10 DIAGNOSIS — G4733 Obstructive sleep apnea (adult) (pediatric): Secondary | ICD-10-CM | POA: Diagnosis not present

## 2023-03-10 DIAGNOSIS — D62 Acute posthemorrhagic anemia: Secondary | ICD-10-CM | POA: Diagnosis not present

## 2023-03-10 LAB — GLUCOSE, CAPILLARY
Glucose-Capillary: 143 mg/dL — ABNORMAL HIGH (ref 70–99)
Glucose-Capillary: 147 mg/dL — ABNORMAL HIGH (ref 70–99)
Glucose-Capillary: 180 mg/dL — ABNORMAL HIGH (ref 70–99)
Glucose-Capillary: 114 mg/dL — ABNORMAL HIGH (ref 70–99)

## 2023-03-10 LAB — CBC
HCT: 29 % — ABNORMAL LOW (ref 36.0–46.0)
Hemoglobin: 9.4 g/dL — ABNORMAL LOW (ref 12.0–15.0)
MCH: 29 pg (ref 26.0–34.0)
MCHC: 32.4 g/dL (ref 30.0–36.0)
MCV: 89.5 fL (ref 80.0–100.0)
Platelets: 162 10*3/uL (ref 150–400)
RBC: 3.24 MIL/uL — ABNORMAL LOW (ref 3.87–5.11)
RDW: 16.7 % — ABNORMAL HIGH (ref 11.5–15.5)
WBC: 8.2 10*3/uL (ref 4.0–10.5)
nRBC: 0 % (ref 0.0–0.2)

## 2023-03-10 LAB — HEPARIN LEVEL (UNFRACTIONATED): Heparin Unfractionated: 0.45 IU/mL (ref 0.30–0.70)

## 2023-03-10 LAB — BASIC METABOLIC PANEL
Anion gap: 10 (ref 5–15)
BUN: 15 mg/dL (ref 8–23)
CO2: 21 mmol/L — ABNORMAL LOW (ref 22–32)
Calcium: 8.5 mg/dL — ABNORMAL LOW (ref 8.9–10.3)
Chloride: 104 mmol/L (ref 98–111)
Creatinine, Ser: 0.75 mg/dL (ref 0.44–1.00)
GFR, Estimated: >60 mL/min (ref >60)
Glucose, Bld: 149 mg/dL — ABNORMAL HIGH (ref 70–99)
Potassium: 3.8 mmol/L (ref 3.5–5.1)
Sodium: 135 mmol/L (ref 135–145)

## 2023-03-10 MED ORDER — LORAZEPAM 0.5 MG PO TABS
0.5000 mg | ORAL_TABLET | Freq: Four times a day (QID) | ORAL | Status: DC | PRN
  Administered 2023-03-10: 0.5 mg via ORAL
  Filled 2023-03-10: qty 1

## 2023-03-10 MED ORDER — LACTULOSE 10 GM/15ML PO SOLN
20.0000 g | Freq: Once | ORAL | Status: AC
  Administered 2023-03-10: 20 g via ORAL
  Filled 2023-03-10: qty 30

## 2023-03-10 MED ORDER — QUETIAPINE FUMARATE 25 MG PO TABS
25.0000 mg | ORAL_TABLET | Freq: Every day | ORAL | Status: DC
  Administered 2023-03-10 – 2023-03-11 (×2): 25 mg via ORAL
  Filled 2023-03-10 (×2): qty 1

## 2023-03-10 MED ORDER — SENNOSIDES-DOCUSATE SODIUM 8.6-50 MG PO TABS
2.0000 | ORAL_TABLET | Freq: Two times a day (BID) | ORAL | Status: DC
  Administered 2023-03-10 – 2023-03-11 (×4): 2 via ORAL
  Filled 2023-03-10 (×3): qty 2

## 2023-03-10 NOTE — Progress Notes (Signed)
SUBJECTIVE: Patient denies any chest pain or shortness of breath but has left leg pain.   Vitals:   03/09/23 2300 03/10/23 0513 03/10/23 0700 03/10/23 0805  BP: (!) 110/59 127/61  126/70  Pulse: (!) 102 (!) 104  (!) 109  Resp: 18 16  (!) 24  Temp: 98.9 F (37.2 C) 98.5 F (36.9 C)  98.9 F (37.2 C)  TempSrc: Oral   Oral  SpO2: 97% 100% 98% 99%  Weight:      Height:        Intake/Output Summary (Last 24 hours) at 03/10/2023 0928 Last data filed at 03/10/2023 0500 Gross per 24 hour  Intake 600 ml  Output 1200 ml  Net -600 ml    LABS: Basic Metabolic Panel: Recent Labs    03/09/23 0514 03/10/23 0504  NA 132* 135  K 3.7 3.8  CL 103 104  CO2 21* 21*  GLUCOSE 155* 149*  BUN 15 15  CREATININE 0.74 0.75  CALCIUM 8.3* 8.5*  MG 1.9  --    Liver Function Tests: No results for input(s): "AST", "ALT", "ALKPHOS", "BILITOT", "PROT", "ALBUMIN" in the last 72 hours. No results for input(s): "LIPASE", "AMYLASE" in the last 72 hours. CBC: Recent Labs    03/09/23 0514 03/10/23 0504  WBC 7.9 8.2  HGB 9.5* 9.4*  HCT 29.4* 29.0*  MCV 88.3 89.5  PLT 145* 162   Cardiac Enzymes: No results for input(s): "CKTOTAL", "CKMB", "CKMBINDEX", "TROPONINI" in the last 72 hours. BNP: Invalid input(s): "POCBNP" D-Dimer: No results for input(s): "DDIMER" in the last 72 hours. Hemoglobin A1C: No results for input(s): "HGBA1C" in the last 72 hours. Fasting Lipid Panel: No results for input(s): "CHOL", "HDL", "LDLCALC", "TRIG", "CHOLHDL", "LDLDIRECT" in the last 72 hours. Thyroid Function Tests: No results for input(s): "TSH", "T4TOTAL", "T3FREE", "THYROIDAB" in the last 72 hours.  Invalid input(s): "FREET3" Anemia Panel: No results for input(s): "VITAMINB12", "FOLATE", "FERRITIN", "TIBC", "IRON", "RETICCTPCT" in the last 72 hours.   PHYSICAL EXAM General: Well developed, well nourished, in no acute distress HEENT:  Normocephalic and atramatic Neck:  No JVD.  Lungs: Clear bilaterally  to auscultation and percussion. Heart: HRRR . Normal S1 and S2 without gallops or murmurs.  Abdomen: Bowel sounds are positive, abdomen soft and non-tender  Msk:  Back normal, normal gait. Normal strength and tone for age. Extremities: No clubbing, cyanosis or edema.   Neuro: Alert and oriented X 3. Psych:  Good affect, responds appropriately  TELEMETRY: Sinus rhythm 70 bpm  ASSESSMENT AND PLAN: Critical limb ischemia of the left lower extremity due to severe peripheral occlusion requiring above-knee amputation in the morning.  Patient is moderate risk for cardiac complications.  Advise continue heparin perioperatively and convert to Eliquis after surgery.  Continue rest of the medications.  Patient appears to be cardiac point of view is stable at this time without any chest pain or palpitation or shortness of breath.  Advise proceeding with surgery.   ICD-10-CM   1. Arterial occlusion, lower extremity (HCC)  I70.209       Principal Problem:   PAD (peripheral artery disease) (HCC) Active Problems:   OSA (obstructive sleep apnea)   Type 2 diabetes mellitus with hyperlipidemia (HCC)   Metabolic acidosis   Tobacco abuse   Acute postoperative anemia due to expected blood loss   COPD (chronic obstructive pulmonary disease) (HCC)   Dementia (HCC)   History of CVA (cerebrovascular accident)   Critical limb ischemia of left lower extremity with ulceration of lower  leg (HCC)   Hyponatremia   Overweight (BMI 25.0-29.9)   Arterial occlusion, lower extremity (HCC)   Thrombocytopenia (HCC)   Nocturnal hypoxia    Adrian Blackwater, MD, [facility] 03/10/2023 9:28 AM

## 2023-03-10 NOTE — Progress Notes (Signed)
MEWS Progress Note  Patient Details Name: Autumn Arnold MRN: [id number] DOB: Sep 25, 1951 Today's Date: 03/10/2023   MEWS Flowsheet Documentation:  Assess: MEWS Score Temp: 98.9 F (37.2 C) BP: 126/70 MAP (mmHg): 81 Pulse Rate: (!) 109 ECG Heart Rate: (!) 131 Resp: (!) 24 Level of Consciousness: Alert SpO2: 99 % O2 Device: Nasal Cannula O2 Flow Rate ([name]): 1 [name] Assess: MEWS Score MEWS Temp: 0 MEWS Systolic: 0 MEWS Pulse: 1 MEWS RR: 1 MEWS LOC: 0 MEWS Score: 2 MEWS Score Color: Yellow Assess: SIRS CRITERIA SIRS Temperature : 0 SIRS Respirations : 1 SIRS Pulse: 1 SIRS WBC: 0 SIRS Score Sum : 2 Assess: if the MEWS score is Yellow or Red Were vital signs taken at a resting state?: Yes Focused Assessment: No change from prior assessment Does the patient meet 2 or more of the SIRS criteria?: No MEWS guidelines implemented : Yes, yellow Treat MEWS Interventions: Considered administering scheduled or prn medications/treatments as ordered Take Vital Signs Increase Vital Sign Frequency : Yellow: Q2hr x1, continue Q4hrs until patient remains green for 12hrs Escalate MEWS: Escalate: Yellow: Discuss with charge nurse and consider notifying provider and/or RRT Notify: Charge Nurse/RN Name of Charge Nurse/RN Notified: Personal assistant, RN Provider Notification Provider Name/Title: Marrion Coy Date Provider Notified: 03/10/23 Time Provider Notified: [phone] Method of Notification: Face-to-face Notification Reason: Other (Comment) (yellow mews) Provider response: At bedside Date of Provider Response: 03/06/23 Time of Provider Response: 1725      Autumn Arnold 03/10/2023, 8:33 AM

## 2023-03-10 NOTE — Progress Notes (Signed)
  Progress Note   Patient: Autumn Arnold ZOX:[id number] DOB: Aug 23, 1951 DOA: 03/06/2023     4 DOS: the patient was seen and examined on 03/10/2023   Brief hospital course: MACALAH SKURKA is a 72 y.o. female with medical history significant of tobacco abuse, coronary artery disease, COPD, history of CVA, peripheral arterial disease, anxiety depression, essential hypertension, dyslipidemia, paroxysmal atrial fibrillation who present to the hospital with complaints of worsening left leg pain for the last 3 to 4 days.  Seen by vascular surgery, angiogram was performed on 5/2.  Due to extensive peripheral arterial diseases, AKA is planned on 5/6.   Principal Problem:   PAD (peripheral artery disease) (HCC) Active Problems:   Type 2 diabetes mellitus with hyperlipidemia (HCC)   OSA (obstructive sleep apnea)   Metabolic acidosis   Tobacco abuse   Acute postoperative anemia due to expected blood loss   COPD (chronic obstructive pulmonary disease) (HCC)   Dementia (HCC)   History of CVA (cerebrovascular accident)   Critical limb ischemia of left lower extremity with ulceration of lower leg (HCC)   Hyponatremia   Overweight (BMI 25.0-29.9)   Arterial occlusion, lower extremity (HCC)   Thrombocytopenia (HCC)   Nocturnal hypoxia   Assessment and Plan: Left leg ischemia with peripheral arterial disease. Patient previously had multiple stents placed in left lower extremity, she has a worsening ischemia at this time. Angiogram showed diffuse vascular disease, patient still on heparin drip.  Planning for AKA on Monday. Currently well, continue heparin drip, due to risk of bleeding, discontinued Plavix.  Continue aspirin. Continue symptomatic treatment.   Obstructive sleep apnea with nocturnal hypoxia. COPD. Patient does not tolerate the CPAP, continue nocturnal oxygen as needed.  Continue bronchodilator.  Condition stable, no exacerbation. Incentive spirometer.  Acute blood loss  anemia. Thrombocytopenia. Condition improving.   Paroxysmal atrial fibrillation ruled out History of stroke. Reviewed previous records, patient never been diagnosed with atrial fibrillation in the past. Continue antiplatelet treatment and Lipitor.   Dementia without behavioral disturbance. Continue home medicines.   Tobacco abuse. Advised to quit.      Subjective:  Patient was on oxygen last night, does not feel short of breath.  Does feel constipated.  Physical Exam: Vitals:   03/09/23 2300 03/10/23 0513 03/10/23 0700 03/10/23 0805  BP: (!) 110/59 127/61  126/70  Pulse: (!) 102 (!) 104  (!) 109  Resp: 18 16  (!) 24  Temp: 98.9 F (37.2 C) 98.5 F (36.9 C)  98.9 F (37.2 C)  TempSrc: Oral   Oral  SpO2: 97% 100% 98% 99%  Weight:      Height:       General exam: Appears calm and comfortable  Respiratory system: Decreased breath sounds. Respiratory effort normal. Cardiovascular system: Regular and mild tachycardic.Marland Kitchen No JVD, murmurs, rubs, gallops or clicks. No pedal edema. Gastrointestinal system: Abdomen is nondistended, soft and nontender. No organomegaly or masses felt. Normal bowel sounds heard. Central nervous system: Alert and oriented. No focal neurological deficits. Extremities: Left leg cold. Skin: No rashes, lesions or ulcers Psychiatry: Judgement and insight appear normal. Mood & affect appropriate.    Data Reviewed:  Lab results reviewed.  Family Communication: Daughter was updated.  Disposition: Status is: Inpatient Remains inpatient appropriate because: Severity of disease, IV treatment, procedure tomorrow.     Time spent: 35 minutes  Author: Marrion Coy, MD 03/10/2023 9:51 AM  For on call review [email or web address].

## 2023-03-10 NOTE — Consult Note (Signed)
ANTICOAGULATION CONSULT NOTE   Pharmacy Consult for heparin infusion Indication:  Vascular occlusion left lower extremity  Allergies  Allergen Reactions   Lisinopril Other (See Comments)    Tongue burning sensation    Patient Measurements: Height: 4\' 11"  (149.9 cm) Weight: 65.3 kg (144 lb) IBW/kg (Calculated) : 43.2 Heparin Dosing Weight: 65.3 kg  Vital Signs: Temp: 98.5 F (36.9 C) (05/05 0513) Temp Source: Oral (05/04 2300) BP: 127/61 (05/05 0513) Pulse Rate: 104 (05/05 0513)  Labs: Recent Labs    03/08/23 0438 03/08/23 1442 03/09/23 0514 03/10/23 0504  HGB 8.5*   < > 9.5* 9.4*  HCT 26.3*  --  29.4* 29.0*  PLT 127*  --  145* 162  HEPARINUNFRC 0.61  --  0.34 0.45  CREATININE  --   --  0.74  --    < > = values in this interval not displayed.     Estimated Creatinine Clearance: 53.7 mL/min (by C-G formula based on SCr of 0.74 mg/dL).   Medical History: Past Medical History:  Diagnosis Date   Anxiety    Aortic atherosclerosis (HCC)    Arthritis    Asthma    Ataxic gait    a.) requires gait aids for assitance   Atherosclerotic PVD with intermittent claudication (HCC)    a.) s/p LEFT femoral endartarectomy and LEFT iliac stent 04/11/2022   Bilateral carotid artery disease (HCC) 11/07/2015   a.) doppler 11/07/2015: <50 BICA; b.) doppler 06/23/2021: near occ RICA< < 50% LICA; c.) doppler 04/05/2022: occluded RICA, mod plaque LICA   Bilateral lower extremity edema    CAD (coronary artery disease)    Chronic cough    COPD (chronic obstructive pulmonary disease) (HCC)    CVA (cerebral vascular accident) (HCC) 11/07/2015   a.) posterior superior LEFT lenticular nucleus to posterior LEFT corona radiata   DDD (degenerative disc disease), cervical    a.) s/p C5-C6 fusion   DOE (dyspnea on exertion)    GERD (gastroesophageal reflux disease)    History of bilateral cataract extraction 2017   History of left cataract extraction    Hyperlipidemia    Hypertension     Ischemic cerebrovascular accident (CVA) (HCC) 06/23/2021   a.) superior LEFT frontal and parietal ischemia   Ischemic cerebrovascular accident (CVA) (HCC) 04/04/2022   a.) subcentimeter cortical/subcortical aspect of RIGHT frontoparietal region --> acute to early subacte ischemic infarct   Long term current use of antithrombotics/antiplatelets    a.) DAPT (ASA + clopidogrel)   Lower extremity weakness    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HCC)    a.) s/p PTA and stenting 06/23/2022 --> SMA, mid-infrarenal, BILATERAL CIA; b.) s/p PTA and stenting 09/25/2022: SMA, BILATERAL CIA, infrarenal aorta, LEFT CFA   OSA on CPAP    PAF (paroxysmal atrial fibrillation) (HCC)    Right-sided lacunar infarction (HCC) 02/09/2010   a.) non-hemorrhagic   Small fiber neuropathy    T2DM (type 2 diabetes mellitus) (HCC)    Vitamin D deficiency    Wheezing     Medications:  No prior anticoagulation noted  Assessment: 72 y.o. female presents to the ED via ambulance with complaint of left leg pain for approximately 3 weeks. PMH of peripheral vascular disease. Concern for ischemic LLE. Pharmacy has been consulted to initiate and manage IV heparin therapy.    Goal of Therapy:  Heparin level 0.3-0.7 units/ml Monitor platelets by anticoagulation protocol: Yes  05/02 0026 HL 0.16, subtherapeutic 05/02 0903 HL 0.51, therapeutic x 1 05/02 2128 HL 0.64, therapeutic x  2 05/03 0438 HL 0.61, therapeutic x 3 05/04 0514 HL 0.34, therapeutic x 4 05/05 0504 HL 0.45, therapeutic x 5  Plan:  Heparin level therapeutic x 5 Continue heparin infusion at 1300 units/hr Recheck HL daily with AM labs Daily CBC while on heparin  Otelia Sergeant, PharmD, [facility] 03/10/2023 6:13 AM

## 2023-03-11 ENCOUNTER — Encounter
Admission: EM | Disposition: A | Discharge status: Hospice | Payer: Self-pay | Source: Home / Self Care | Attending: Internal Medicine

## 2023-03-11 DIAGNOSIS — F0282 Dementia in other diseases classified elsewhere, unspecified severity, with psychotic disturbance: Secondary | ICD-10-CM | POA: Diagnosis not present

## 2023-03-11 DIAGNOSIS — I739 Peripheral vascular disease, unspecified: Secondary | ICD-10-CM | POA: Diagnosis not present

## 2023-03-11 DIAGNOSIS — Z9889 Other specified postprocedural states: Secondary | ICD-10-CM | POA: Diagnosis not present

## 2023-03-11 DIAGNOSIS — I70223 Atherosclerosis of native arteries of extremities with rest pain, bilateral legs: Secondary | ICD-10-CM | POA: Diagnosis not present

## 2023-03-11 DIAGNOSIS — G301 Alzheimer's disease with late onset: Secondary | ICD-10-CM | POA: Diagnosis not present

## 2023-03-11 LAB — CBC
HCT: 26.4 % — ABNORMAL LOW (ref 36.0–46.0)
Hemoglobin: 8.7 g/dL — ABNORMAL LOW (ref 12.0–15.0)
MCH: 28.9 pg (ref 26.0–34.0)
MCHC: 33 g/dL (ref 30.0–36.0)
MCV: 87.7 fL (ref 80.0–100.0)
Platelets: 154 10*3/uL (ref 150–400)
RBC: 3.01 MIL/uL — ABNORMAL LOW (ref 3.87–5.11)
RDW: 16.7 % — ABNORMAL HIGH (ref 11.5–15.5)
WBC: 7.6 10*3/uL (ref 4.0–10.5)
nRBC: 0 % (ref 0.0–0.2)

## 2023-03-11 LAB — GLUCOSE, CAPILLARY
Glucose-Capillary: 178 mg/dL — ABNORMAL HIGH (ref 70–99)
Glucose-Capillary: 127 mg/dL — ABNORMAL HIGH (ref 70–99)
Glucose-Capillary: 162 mg/dL — ABNORMAL HIGH (ref 70–99)
Glucose-Capillary: 130 mg/dL — ABNORMAL HIGH (ref 70–99)
Glucose-Capillary: 143 mg/dL — ABNORMAL HIGH (ref 70–99)

## 2023-03-11 LAB — HEPARIN LEVEL (UNFRACTIONATED): Heparin Unfractionated: 0.71 IU/mL — ABNORMAL HIGH (ref 0.30–0.70)

## 2023-03-11 SURGERY — AMPUTATION, ABOVE KNEE
Anesthesia: General | Site: Knee | Laterality: Left

## 2023-03-11 MED ORDER — HYDROMORPHONE HCL 1 MG/ML IJ SOLN
1.0000 mg | INTRAMUSCULAR | Status: DC | PRN
  Administered 2023-03-11 – 2023-03-12 (×5): 1 mg via INTRAVENOUS
  Filled 2023-03-11 (×5): qty 1

## 2023-03-11 MED ORDER — IPRATROPIUM-ALBUTEROL 0.5-2.5 (3) MG/3ML IN SOLN: 3.0000 mL | Freq: Two times a day (BID) | RESPIRATORY_TRACT | Status: DC

## 2023-03-11 MED ORDER — CLOPIDOGREL BISULFATE 75 MG PO TABS
75.0000 mg | ORAL_TABLET | Freq: Every day | ORAL | Status: DC
  Administered 2023-03-11: 75 mg via ORAL
  Filled 2023-03-11: qty 1

## 2023-03-11 NOTE — Progress Notes (Signed)
Progress Note   Patient: Autumn Arnold ZOX:[id number] DOB: 1951/10/19 DOA: 03/06/2023     5 DOS: the patient was seen and examined on 03/11/2023   Brief hospital course: Autumn Arnold is a 72 y.o. female with medical history significant of tobacco abuse, coronary artery disease, COPD, history of CVA, peripheral arterial disease, anxiety depression, essential hypertension, dyslipidemia, paroxysmal atrial fibrillation who present to the hospital with complaints of worsening left leg pain for the last 3 to 4 days.  Seen by vascular surgery, angiogram was performed on 5/2.  Due to extensive peripheral arterial diseases, AKA is planned on 5/6. However, patient refused AKA.  Discussed with family and the patient, will obtain palliative care consult, patient will need hospice.  Also changed to CODE STATUS to DNR after discussion with the family.   Principal Problem:   PAD (peripheral artery disease) (HCC) Active Problems:   Type 2 diabetes mellitus with hyperlipidemia (HCC)   OSA (obstructive sleep apnea)   Metabolic acidosis   Tobacco abuse   Acute postoperative anemia due to expected blood loss   COPD (chronic obstructive pulmonary disease) (HCC)   Dementia (HCC)   History of CVA (cerebrovascular accident)   Critical limb ischemia of left lower extremity with ulceration of lower leg (HCC)   Hyponatremia   Overweight (BMI 25.0-29.9)   Arterial occlusion, lower extremity (HCC)   Thrombocytopenia (HCC)   Nocturnal hypoxia   Assessment and Plan: Left leg ischemia with peripheral arterial disease. Patient previously had multiple stents placed in left lower extremity, she has a worsening ischemia at this time. Angiogram showed diffuse vascular disease, patient is treated with heparin drip.  Planning for AKA.  However, patient has refused AKA.  Due to severe ischemia, patient prognosis without AKA is very poor.  As a result, palliative care will be obtained.  Most likely patient will be discharged  with hospice. At this point, discontinue heparin drip, restart aspirin and Plavix.   Obstructive sleep apnea with nocturnal hypoxia. COPD. Patient does not tolerate the CPAP, continue nocturnal oxygen as needed.  Continue bronchodilator.  Condition stable, no exacerbation. Condition has been stable.   Acute blood loss anemia. Thrombocytopenia. Condition improving.   Paroxysmal atrial fibrillation ruled out History of stroke. Reviewed previous records, patient never been diagnosed with atrial fibrillation in the past. Continue antiplatelet treatment and Lipitor.   Dementia without behavioral disturbance. Continue home medicines.   Tobacco abuse. Advised to quit.       Subjective:  Patient still having leg pain, but taking pain medicine. Short of breath seem to be at baseline.  Physical Exam: Vitals:   03/10/23 2018 03/11/23 0436 03/11/23 0818 03/11/23 1028  BP: 115/76 (!) 109/51 (!) 145/51 (!) 122/41  Pulse: (!) 53 (!) 102 (!) 113 (!) 110  Resp:  20 20 18   Temp: 98.3 F (36.8 C) 98.1 F (36.7 C) 99.2 F (37.3 C) 99.6 F (37.6 C)  TempSrc: Oral Axillary  Oral  SpO2: 98% 100% 100% 96%  Weight:      Height:       General exam: Appears calm and comfortable  Respiratory system: Clear to auscultation. Respiratory effort normal. Cardiovascular system: S1 & S2 heard, RRR. No JVD, murmurs, rubs, gallops or clicks. No pedal edema. Gastrointestinal system: Abdomen is nondistended, soft and nontender. No organomegaly or masses felt. Normal bowel sounds heard. Central nervous system: Alert and oriented. No focal neurological deficits. Extremities: Left leg still cold. Skin: No rashes, lesions or ulcers Psychiatry: Judgement and  insight appear normal. Mood & affect appropriate.    Data Reviewed:  Lab results reviewed.  Family Communication: Daughter at bedside, updated as above.  Disposition: Status is: Inpatient Remains inpatient appropriate because: Severity of  disease Patient home environment is not suitable for patient to go home per daughter, will have looking for long-term care with hospice.     Time spent: 50 minutes  Author: Marrion Coy, MD 03/11/2023 10:42 AM  For on call review [email or web address].

## 2023-03-11 NOTE — Consult Note (Addendum)
ANTICOAGULATION CONSULT NOTE   Pharmacy Consult for heparin infusion Indication:  Vascular occlusion left lower extremity  Allergies  Allergen Reactions   Lisinopril Other (See Comments)    Tongue burning sensation    Patient Measurements: Height: 4\' 11"  (149.9 cm) Weight: 65.3 kg (144 lb) IBW/kg (Calculated) : 43.2 Heparin Dosing Weight: 65.3 kg  Vital Signs: Temp: 98.1 F (36.7 C) (05/06 0436) Temp Source: Axillary (05/06 0436) BP: 109/51 (05/06 0436) Pulse Rate: 102 (05/06 0436)  Labs: Recent Labs    03/09/23 0514 03/10/23 0504 03/11/23 0701  HGB 9.5* 9.4* 8.7*  HCT 29.4* 29.0* 26.4*  PLT 145* 162 154  HEPARINUNFRC 0.34 0.45 0.71*  CREATININE 0.74 0.75  --      Estimated Creatinine Clearance: 53.7 mL/min (by C-G formula based on SCr of 0.75 mg/dL).   Medical History: Past Medical History:  Diagnosis Date   Anxiety    Aortic atherosclerosis (HCC)    Arthritis    Asthma    Ataxic gait    a.) requires gait aids for assitance   Atherosclerotic PVD with intermittent claudication (HCC)    a.) s/p LEFT femoral endartarectomy and LEFT iliac stent 04/11/2022   Bilateral carotid artery disease (HCC) 11/07/2015   a.) doppler 11/07/2015: <50 BICA; b.) doppler 06/23/2021: near occ RICA< < 50% LICA; c.) doppler 04/05/2022: occluded RICA, mod plaque LICA   Bilateral lower extremity edema    CAD (coronary artery disease)    Chronic cough    COPD (chronic obstructive pulmonary disease) (HCC)    CVA (cerebral vascular accident) (HCC) 11/07/2015   a.) posterior superior LEFT lenticular nucleus to posterior LEFT corona radiata   DDD (degenerative disc disease), cervical    a.) s/p C5-C6 fusion   DOE (dyspnea on exertion)    GERD (gastroesophageal reflux disease)    History of bilateral cataract extraction 2017   History of left cataract extraction    Hyperlipidemia    Hypertension    Ischemic cerebrovascular accident (CVA) (HCC) 06/23/2021   a.) superior LEFT  frontal and parietal ischemia   Ischemic cerebrovascular accident (CVA) (HCC) 04/04/2022   a.) subcentimeter cortical/subcortical aspect of RIGHT frontoparietal region --> acute to early subacte ischemic infarct   Long term current use of antithrombotics/antiplatelets    a.) DAPT (ASA + clopidogrel)   Lower extremity weakness    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HCC)    a.) s/p PTA and stenting 06/23/2022 --> SMA, mid-infrarenal, BILATERAL CIA; b.) s/p PTA and stenting 09/25/2022: SMA, BILATERAL CIA, infrarenal aorta, LEFT CFA   OSA on CPAP    PAF (paroxysmal atrial fibrillation) (HCC)    Right-sided lacunar infarction (HCC) 02/09/2010   a.) non-hemorrhagic   Small fiber neuropathy    T2DM (type 2 diabetes mellitus) (HCC)    Vitamin D deficiency    Wheezing     Medications:  No prior anticoagulation noted  Assessment: 72 y.o. female presents to the ED via ambulance with complaint of left leg pain for approximately 3 weeks. PMH of peripheral vascular disease. Patient dx with critical limb ischemia due to severe peripheral occlusion requiring above-knee amputation  . Pharmacy has been consulted to initiate and manage IV heparin therapy.    *above the knee amputation scheduled for today 03/11/2023*  Goal of Therapy:  Heparin level 0.3-0.7 units/ml Monitor platelets by anticoagulation protocol: Yes  05/02 0026 HL 0.16, subtherapeutic 05/02 0903 HL 0.51, therapeutic x 1 05/02 2128 HL 0.64, therapeutic x 2 05/03 0438 HL 0.61, therapeutic x 3 05/04 0514  HL 0.34, therapeutic x 4 05/05 0504 HL 0.45, therapeutic x 5 05/06 0701 HL 0.71, therapeutic x 6 - amputation scheduled for today  Plan:  Heparin level remains therapeutic. Will continue current rate of 1300 un/hr until time to hold for surgery. Noted hgb dropped since admission but stable for last 48hrs If patient is to esume heparin infusion, will plan to repeat HL 6 hrs after infusion resumed. Recheck HL daily with AM labs Daily CBC  while on heparin  Sokhna Christoph Rodriguez-Guzman PharmD, BCPS 03/11/2023 7:38 AM

## 2023-03-11 NOTE — Progress Notes (Signed)
[facility]  Daily Progress Note   Subjective  -   Continues to have worsening discoloration and pain in the left lower extremity.  Has known not unreconstructable vascular disease and was tentatively on the schedule today for above-knee amputation.  Patient and family now would like to discuss with palliative care service.  Objective Vitals:   03/10/23 2018 03/11/23 0436 03/11/23 0818 03/11/23 1028  BP: 115/76 (!) 109/51 (!) 145/51 (!) 122/41  Pulse: (!) 53 (!) 102 (!) 113 (!) 110  Resp:  20 20 18   Temp: 98.3 F (36.8 C) 98.1 F (36.7 C) 99.2 F (37.3 C) 99.6 F (37.6 C)  TempSrc: Oral Axillary  Oral  SpO2: 98% 100% 100% 96%  Weight:      Height:        Intake/Output Summary (Last 24 hours) at 03/11/2023 1145 Last data filed at 03/11/2023 1029 Gross per 24 hour  Intake 1107.5 ml  Output 1300 ml  Net -192.5 ml    PULM  CTAB CV  tachycardic VASC  Left leg is mottled and discolored up to above the knee now.    Laboratory CBC    Component Value Date/Time   WBC 7.6 03/11/2023 0701   HGB 8.7 (L) 03/11/2023 0701   HCT 26.4 (L) 03/11/2023 0701   PLT 154 03/11/2023 0701    BMET    Component Value Date/Time   NA 135 03/10/2023 0504   K 3.8 03/10/2023 0504   CL 104 03/10/2023 0504   CO2 21 (L) 03/10/2023 0504   GLUCOSE 149 (H) 03/10/2023 0504   BUN 15 03/10/2023 0504   CREATININE 0.75 03/10/2023 0504   CALCIUM 8.5 (L) 03/10/2023 0504   GFRNONAA >60 03/10/2023 0504   GFRAA >60 11/08/2015 0419    Assessment/Planning: POD #4 s/p LLE angiogram  Has not unreconstructable left lower extremity ischemia From a vascular standpoint, the only option would be an above-knee amputation This was planned for today but after discussions with both myself and my nurse practitioner, the patient and the family have opted for a palliative care consult and have declined amputation at this time which is reasonable If the patient and family change their mind,  please contact our service and we will be happy to reassess.   Festus Barren  03/11/2023, 11:45 AM

## 2023-03-11 NOTE — Progress Notes (Signed)
[facility] Cardiology    SUBJECTIVE: Pt resting in room with family . C/O of presistent leg pain.   Vitals:   03/10/23 1531 03/10/23 1931 03/10/23 2018 03/11/23 0436  BP: (!) 109/48  115/76 (!) 109/51  Pulse: (!) 103  (!) 53 (!) 102  Resp: (!) 24   20  Temp: 98.5 F (36.9 C)  98.3 F (36.8 C) 98.1 F (36.7 C)  TempSrc: Oral  Oral Axillary  SpO2: 98% 99% 98% 100%  Weight:      Height:         Intake/Output Summary (Last 24 hours) at 03/11/2023 0758 Last data filed at 03/11/2023 0446 Gross per 24 hour  Intake 1107.5 ml  Output 2750 ml  Net -1642.5 ml      PHYSICAL EXAM  General: Well developed, well nourished, in no acute distress HEENT:  Normocephalic and atramatic Neck:  No JVD.  Lungs: Clear bilaterally to auscultation and percussion. Heart: HRRR . Normal S1 and S2 without gallops or murmurs.  Abdomen: Bowel sounds are positive, abdomen soft and non-tender  Msk:  Back normal, normal gait. Normal strength and tone for age. Extremities: No clubbing, cyanosis or edema.   Neuro: Alert and oriented X 3. Psych:  Good affect, responds appropriately   LABS: Basic Metabolic Panel: Recent Labs    03/09/23 0514 03/10/23 0504  NA 132* 135  K 3.7 3.8  CL 103 104  CO2 21* 21*  GLUCOSE 155* 149*  BUN 15 15  CREATININE 0.74 0.75  CALCIUM 8.3* 8.5*  MG 1.9  --    Liver Function Tests: No results for input(s): "AST", "ALT", "ALKPHOS", "BILITOT", "PROT", "ALBUMIN" in the last 72 hours. No results for input(s): "LIPASE", "AMYLASE" in the last 72 hours. CBC: Recent Labs    03/10/23 0504 03/11/23 0701  WBC 8.2 7.6  HGB 9.4* 8.7*  HCT 29.0* 26.4*  MCV 89.5 87.7  PLT 162 154   Cardiac Enzymes: No results for input(s): "CKTOTAL", "CKMB", "CKMBINDEX", "TROPONINI" in the last 72 hours. BNP: Invalid input(s): "POCBNP" D-Dimer: No results for input(s): "DDIMER" in the last 72 hours. Hemoglobin A1C: No results for input(s): "HGBA1C" in the last 72 hours. Fasting Lipid  Panel: No results for input(s): "CHOL", "HDL", "LDLCALC", "TRIG", "CHOLHDL", "LDLDIRECT" in the last 72 hours. Thyroid Function Tests: No results for input(s): "TSH", "T4TOTAL", "T3FREE", "THYROIDAB" in the last 72 hours.  Invalid input(s): "FREET3" Anemia Panel: No results for input(s): "VITAMINB12", "FOLATE", "FERRITIN", "TIBC", "IRON", "RETICCTPCT" in the last 72 hours.  No results found.   Echo   TELEMETRY: :  ASSESSMENT AND PLAN:  Principal Problem:   PAD (peripheral artery disease) (HCC) Active Problems:   OSA (obstructive sleep apnea)   Type 2 diabetes mellitus with hyperlipidemia (HCC)   Metabolic acidosis   Tobacco abuse   Acute postoperative anemia due to expected blood loss   COPD (chronic obstructive pulmonary disease) (HCC)   Dementia (HCC)   History of CVA (cerebrovascular accident)   Critical limb ischemia of left lower extremity with ulceration of lower leg (HCC)   Hyponatremia   Overweight (BMI 25.0-29.9)   Arterial occlusion, lower extremity (HCC)   Thrombocytopenia (HCC)   Nocturnal hypoxia   Plan Continue heparin pre-op Agree with palliative care consult Continue pain  control LE ulcer non healing , continue local care    Alwyn Pea, MD 03/11/2023 7:58 AM

## 2023-03-11 NOTE — TOC Initial Note (Signed)
Transition of Care [facility]) - Initial/Assessment Note    Patient Details  Name: Autumn DILLAHUNT MRN: [id number] Date of Birth: 09-08-1951  Transition of Care [facility]) CM/SW Contact:    Chapman Fitch, RN Phone Number: 03/11/2023, 4:11 PM  Clinical Narrative:                   Met with patient, sister Inocencio Homes, and daughters Efraim Kaufmann and BJ Patient has opted to forgo surgery and wishes for hospice/comfort measures  Patient and family would like to see if she is a candidate for the [facility] [place] Referral made to [place] with Civil engineer, contracting.   Patient lives at home with husband, who per daughters does not have the mental or physical capacity to care of the patient in the home.  Patient received RW and BSC in 2023  TOC to follow up after hospice assessment  Expected Discharge Plan: Hospice Medical Facility Barriers to Discharge: Continued Medical Work up   Patient Goals and CMS Choice Patient states their goals for this hospitalization and ongoing recovery are:: for pain to be managed and to be comfortable          Expected Discharge Plan and Services                                              Prior Living Arrangements/Services   Lives with:: Spouse              Current home services: DME    Activities of Daily Living Home Assistive Devices/Equipment: Cane (specify quad or straight), Walker (specify type) ADL Screening (condition at time of admission) Patient's cognitive ability adequate to safely complete daily activities?: Yes Is the patient deaf or have difficulty hearing?: No Does the patient have difficulty seeing, even when wearing glasses/contacts?: No Does the patient have difficulty concentrating, remembering, or making decisions?: No Patient able to express need for assistance with ADLs?: Yes Does the patient have difficulty dressing or bathing?: No Independently performs ADLs?: Yes (appropriate for developmental age) Does the  patient have difficulty walking or climbing stairs?: Yes Weakness of Legs: Both Weakness of Arms/Hands: None  Permission Sought/Granted                  Emotional Assessment              Admission diagnosis:  PAD (peripheral artery disease) (HCC) [I73.9] Arterial occlusion, lower extremity (HCC) [I70.209] Patient Active Problem List   Diagnosis Date Noted   Thrombocytopenia (HCC) 03/08/2023   Nocturnal hypoxia 03/08/2023   PAD (peripheral artery disease) (HCC) 03/06/2023   Critical limb ischemia of left lower extremity with ulceration of lower leg (HCC) 03/06/2023   Hyponatremia 03/06/2023   Overweight (BMI 25.0-29.9) 03/06/2023   Arterial occlusion, lower extremity (HCC) 03/06/2023   Hypoglycemia 01/09/2023   Stroke-like symptom 01/08/2023   Elevated CK 01/08/2023   Dementia (HCC) 01/08/2023   Cognitive decline 01/08/2023   Polypharmacy 01/08/2023   History of CVA (cerebrovascular accident) 01/08/2023   Carotid stenosis, symptomatic, with infarction (HCC) 10/04/2022   AKI (acute kidney injury) (HCC) 04/20/2022   Leukocytosis 04/20/2022   Low back pain 04/20/2022   PVD (peripheral vascular disease) (HCC) 04/20/2022   Weakness 04/19/2022   Carotid artery occlusion with infarction (HCC) 04/06/2022   Hypomagnesemia 04/05/2022   Iron deficiency anemia 04/04/2022   Acute CVA (cerebrovascular  accident) (HCC) 04/04/2022   COPD (chronic obstructive pulmonary disease) (HCC)    Hypotension    Gastroesophageal reflux disease without esophagitis 08/10/2021   Carotid stenosis 07/29/2021   Atherosclerosis of native arteries of extremity with intermittent claudication (HCC) 07/17/2021   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HCC) 07/17/2021   Hyperlipidemia 07/17/2021   Atherosclerosis of arteries 07/07/2021   Acute postoperative anemia due to expected blood loss    Retroperitoneal bleed    Drop in hemoglobin    Visual disturbance    Sepsis (HCC) 06/23/2021   SIRS (systemic  inflammatory response syndrome) (HCC)    Rectal bleeding    Tobacco abuse    Anxiety and depression 06/22/2021   Hemiparesis affecting right side as late effect of stroke (HCC) 06/22/2021   OSA (obstructive sleep apnea) 06/22/2021   Type 2 diabetes mellitus with hyperlipidemia (HCC) 06/22/2021   Syncope 06/22/2021   Critical limb ischemia of left lower extremity (HCC) 06/22/2021   Hyperglycemia due to type 2 diabetes mellitus (HCC) 06/22/2021   Metabolic acidosis 06/22/2021   Vitamin D deficiency 07/01/2018   Osteopenia of left hip 06/27/2018   DNR (do not resuscitate) 06/23/2018   Encounter for general adult medical examination without abnormal findings 06/23/2018   Other chronic pain 06/23/2018   Tobacco dependence 10/18/2017   PCP:  Marisue Ivan, MD Pharmacy:   [facility] [phone] - Closed - [place], [place] - [address] [place] [address] Phone: [phone] Fax: [phone]  [id number] [place], [place] - [address] [place] [address] Phone: [phone] Fax: [phone]     Social Determinants of Health (SDOH) Social History: SDOH Screenings   Food Insecurity: No Food Insecurity (03/06/2023)  Housing: Low Risk  (03/06/2023)  Transportation Needs: No Transportation Needs (03/06/2023)  Utilities: Not At Risk (03/06/2023)  Tobacco Use: High Risk (03/08/2023)   SDOH Interventions:     Readmission Risk Interventions    03/11/2023    4:10 PM 10/05/2022   10:51 AM 04/13/2022   10:44 AM  Readmission Risk Prevention Plan  Transportation Screening Complete Complete Complete  PCP or Specialist Appt within 3-5 Days  Complete Complete  HRI or Home Care Consult  Complete Complete  Social Work Consult for Recovery Care Planning/Counseling  Not Complete Complete  SW consult not completed comments  NA   Palliative Care Screening  Not Applicable Not Applicable  Medication Review Oceanographer) Complete Referral to Pharmacy  Complete  SW Recovery Care/Counseling Consult Complete    Palliative Care Screening Complete    Skilled Nursing Facility Not Applicable

## 2023-03-11 NOTE — Progress Notes (Signed)
Insurance claims handler Liaison Note  Received referral for [facility] (inpatient unit) from [place], SW TOC.  Daughter, Berniece Pap not at bedside so I called to initiate contact and discuss hospice philosophy and criteria for hospice IPU.  Melissa had multiple questions.  Space given to process and talk through her questions and concerns.  I gave her numbers to call if she needed to contact the hospital liaison team in the morning for any issues.   She states she will be at the hospital in the morning.  Notified hospital liaison team who will evaluate patient for IPU appropriateness and hospice philosphy and benefit explanation in greater detail.  Thank you for the opportunity to participate in this patient's plan of care.  Norris Cross, RN Nurse Liaision [phone]

## 2023-03-11 NOTE — Plan of Care (Signed)
Consult noted for GOC as patient no longer wants AKA. Epic chat received by [facility], with attending attached, that family has decided on going home with hospice. Spoke with hospice liasion, they advise they will go speak with family now. TOC aware. PMT will shadow for needs.

## 2023-03-12 DIAGNOSIS — F0282 Dementia in other diseases classified elsewhere, unspecified severity, with psychotic disturbance: Secondary | ICD-10-CM | POA: Diagnosis not present

## 2023-03-12 DIAGNOSIS — J449 Chronic obstructive pulmonary disease, unspecified: Secondary | ICD-10-CM | POA: Diagnosis not present

## 2023-03-12 DIAGNOSIS — I70209 Unspecified atherosclerosis of native arteries of extremities, unspecified extremity: Secondary | ICD-10-CM

## 2023-03-12 DIAGNOSIS — G301 Alzheimer's disease with late onset: Secondary | ICD-10-CM | POA: Diagnosis not present

## 2023-03-12 LAB — GLUCOSE, CAPILLARY: Glucose-Capillary: 134 mg/dL — ABNORMAL HIGH (ref 70–99)

## 2023-03-12 MED ORDER — IPRATROPIUM-ALBUTEROL 0.5-2.5 (3) MG/3ML IN SOLN: 3.0000 mL | RESPIRATORY_TRACT | Status: DC | PRN

## 2023-03-12 MED ORDER — HALOPERIDOL LACTATE 2 MG/ML PO CONC: 0.5000 mg | ORAL | Status: DC | PRN

## 2023-03-12 MED ORDER — HYDROMORPHONE HCL 1 MG/ML IJ SOLN: 1.0000 mg | INTRAMUSCULAR | 0 refills | Status: DC | PRN

## 2023-03-12 MED ORDER — BISACODYL 10 MG RE SUPP: 10.0000 mg | Freq: Every day | RECTAL | 0 refills | Status: DC | PRN

## 2023-03-12 MED ORDER — HALOPERIDOL LACTATE 5 MG/ML IJ SOLN: 0.5000 mg | INTRAMUSCULAR | Status: DC | PRN

## 2023-03-12 MED ORDER — LORAZEPAM 0.5 MG PO TABS: 0.5000 mg | ORAL_TABLET | Freq: Four times a day (QID) | ORAL | 0 refills | Status: DC | PRN

## 2023-03-12 MED ORDER — GLYCOPYRROLATE 0.2 MG/ML IJ SOLN: 0.2000 mg | INTRAMUSCULAR | Status: DC | PRN

## 2023-03-12 MED ORDER — BISACODYL 10 MG RE SUPP: 10.0000 mg | Freq: Every day | RECTAL | Status: DC | PRN

## 2023-03-12 MED ORDER — HALOPERIDOL 0.5 MG PO TABS: 0.5000 mg | ORAL_TABLET | ORAL | Status: DC | PRN

## 2023-03-12 MED ORDER — GLYCOPYRROLATE 1 MG PO TABS: 1.0000 mg | ORAL_TABLET | ORAL | Status: DC | PRN

## 2023-03-12 MED ORDER — QUETIAPINE FUMARATE 25 MG PO TABS: 25.0000 mg | ORAL_TABLET | Freq: Every day | ORAL | Status: DC

## 2023-03-12 NOTE — TOC Transition Note (Signed)
Transition of Care [facility]) - CM/SW Discharge Note   Patient Details  Name: Autumn Arnold MRN: [id number] Date of Birth: Jul 20, 1951  Transition of Care [facility]) CM/SW Contact:  Chapman Fitch, RN Phone Number: 03/12/2023, 2:38 PM   Clinical Narrative:     Notified that patient has been accepted by [facility], and will transfer today Per Ree Kida with [employer] family has signed consent.  Ree Kida to arrange EMS transport, Bedside RN to call report Signed DNR and EMS packet on chart    Barriers to Discharge: Continued Medical Work up   Patient Goals and CMS Choice      Discharge Placement                         Discharge Plan and Services Additional resources added to the After Visit Summary for                                       Social Determinants of Health (SDOH) Interventions SDOH Screenings   Food Insecurity: No Food Insecurity (03/06/2023)  Housing: Low Risk  (03/06/2023)  Transportation Needs: No Transportation Needs (03/06/2023)  Utilities: Not At Risk (03/06/2023)  Tobacco Use: High Risk (03/08/2023)     Readmission Risk Interventions    03/11/2023    4:10 PM 10/05/2022   10:51 AM 04/13/2022   10:44 AM  Readmission Risk Prevention Plan  Transportation Screening Complete Complete Complete  PCP or Specialist Appt within 3-5 Days  Complete Complete  HRI or Home Care Consult  Complete Complete  Social Work Consult for Recovery Care Planning/Counseling  Not Complete Complete  SW consult not completed comments  NA   Palliative Care Screening  Not Applicable Not Applicable  Medication Review Oceanographer) Complete Referral to Pharmacy Complete  SW Recovery Care/Counseling Consult Complete    Palliative Care Screening Complete    Skilled Nursing Facility Not Applicable

## 2023-03-12 NOTE — Plan of Care (Signed)
Goals of care consult noted yesterday, and hospice liaison has been working with family as goals were made clear that the family wanted hospice care.  Currently notified that plans are in place for patient to proceed to the hospice facility.  PMT will sign off at this time, please reconsult if needs arise.

## 2023-03-12 NOTE — Care Management Important Message (Signed)
Important Message  Patient Details  Name: Autumn Arnold MRN: [id number] Date of Birth: 21-Jul-1951   Medicare Important Message Given:  Other (see comment)  Disposition to discharge with hospice services.  Medicare IM withheld at this time out of respect for patient and family.    Johnell Comings 03/12/2023, 1:27 PM

## 2023-03-12 NOTE — Consult Note (Signed)
ANTICOAGULATION CONSULT NOTE   Pharmacy Consult for heparin infusion Indication:  Vascular occlusion left lower extremity  Allergies  Allergen Reactions   Lisinopril Other (See Comments)    Tongue burning sensation    Patient Measurements: Height: 4\' 11"  (149.9 cm) Weight: 65.3 kg (144 lb) IBW/kg (Calculated) : 43.2 Heparin Dosing Weight: 65.3 kg  Vital Signs: Temp: 98.5 F (36.9 C) (05/07 0222) Temp Source: Oral (05/06 2201) BP: 118/49 (05/07 0222) Pulse Rate: 103 (05/07 0222)  Labs: Recent Labs    03/10/23 0504 03/11/23 0701  HGB 9.4* 8.7*  HCT 29.0* 26.4*  PLT 162 154  HEPARINUNFRC 0.45 0.71*  CREATININE 0.75  --      Estimated Creatinine Clearance: 53.7 mL/min (by C-G formula based on SCr of 0.75 mg/dL).   Medical History: Past Medical History:  Diagnosis Date   Anxiety    Aortic atherosclerosis (HCC)    Arthritis    Asthma    Ataxic gait    a.) requires gait aids for assitance   Atherosclerotic PVD with intermittent claudication (HCC)    a.) s/p LEFT femoral endartarectomy and LEFT iliac stent 04/11/2022   Bilateral carotid artery disease (HCC) 11/07/2015   a.) doppler 11/07/2015: <50 BICA; b.) doppler 06/23/2021: near occ RICA< < 50% LICA; c.) doppler 04/05/2022: occluded RICA, mod plaque LICA   Bilateral lower extremity edema    CAD (coronary artery disease)    Chronic cough    COPD (chronic obstructive pulmonary disease) (HCC)    CVA (cerebral vascular accident) (HCC) 11/07/2015   a.) posterior superior LEFT lenticular nucleus to posterior LEFT corona radiata   DDD (degenerative disc disease), cervical    a.) s/p C5-C6 fusion   DOE (dyspnea on exertion)    GERD (gastroesophageal reflux disease)    History of bilateral cataract extraction 2017   History of left cataract extraction    Hyperlipidemia    Hypertension    Ischemic cerebrovascular accident (CVA) (HCC) 06/23/2021   a.) superior LEFT frontal and parietal ischemia   Ischemic  cerebrovascular accident (CVA) (HCC) 04/04/2022   a.) subcentimeter cortical/subcortical aspect of RIGHT frontoparietal region --> acute to early subacte ischemic infarct   Long term current use of antithrombotics/antiplatelets    a.) DAPT (ASA + clopidogrel)   Lower extremity weakness    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HCC)    a.) s/p PTA and stenting 06/23/2022 --> SMA, mid-infrarenal, BILATERAL CIA; b.) s/p PTA and stenting 09/25/2022: SMA, BILATERAL CIA, infrarenal aorta, LEFT CFA   OSA on CPAP    PAF (paroxysmal atrial fibrillation) (HCC)    Right-sided lacunar infarction (HCC) 02/09/2010   a.) non-hemorrhagic   Small fiber neuropathy    T2DM (type 2 diabetes mellitus) (HCC)    Vitamin D deficiency    Wheezing     Medications:  No prior anticoagulation noted  Assessment: 72 y.o. female presents to the ED via ambulance with complaint of left leg pain for approximately 3 weeks. PMH of peripheral vascular disease. Patient dx with critical limb ischemia due to severe peripheral occlusion requiring above-knee amputation  . Pharmacy has been consulted to initiate and manage IV heparin therapy.    *above the knee amputation scheduled for today 03/11/2023*  Goal of Therapy:  Heparin level 0.3-0.7 units/ml Monitor platelets by anticoagulation protocol: Yes  05/02 0026 HL 0.16, subtherapeutic 05/02 0903 HL 0.51, therapeutic x 1 05/02 2128 HL 0.64, therapeutic x 2 05/03 0438 HL 0.61, therapeutic x 3 05/04 0514 HL 0.34, therapeutic x 4 05/05 0504  HL 0.45, therapeutic x 5 05/06 0701 HL 0.71, therapeutic x 6 - amputation scheduled for today  Plan:  5/7 @ 0600:  HL not drawn, pt is palliative care/DNR.  Heparin drip d/c'd   Jeananne Bedwell D 03/12/2023 6:48 AM

## 2023-03-12 NOTE — Discharge Summary (Signed)
Physician Discharge Summary   Patient: Autumn Arnold MRN: [id number] DOB: [birth year]  Admit date:     03/06/2023  Discharge date: 03/12/23  Discharge Physician: Marrion Coy   PCP: Marisue Ivan, MD   Recommendations at discharge:   Follow-up with hospice inpatient facility.  Discharge Diagnoses: Principal Problem:   PAD (peripheral artery disease) (HCC) Active Problems:   Type 2 diabetes mellitus with hyperlipidemia (HCC)   OSA (obstructive sleep apnea)   Metabolic acidosis   Tobacco abuse   Acute postoperative anemia due to expected blood loss   COPD (chronic obstructive pulmonary disease) (HCC)   Dementia (HCC)   History of CVA (cerebrovascular accident)   Critical limb ischemia of left lower extremity with ulceration of lower leg (HCC)   Hyponatremia   Overweight (BMI 25.0-29.9)   Arterial occlusion, lower extremity (HCC)   Thrombocytopenia (HCC)   Nocturnal hypoxia  Resolved Problems:   * No resolved hospital problems. *  Hospital Course: [name] is a 72 y.o. female with medical history significant of tobacco abuse, coronary artery disease, COPD, history of CVA, peripheral arterial disease, anxiety depression, essential hypertension, dyslipidemia, paroxysmal atrial fibrillation who present to the hospital with complaints of worsening left leg pain for the last 3 to 4 days.  Seen by vascular surgery, angiogram was performed on 5/2.  Due to extensive peripheral arterial diseases, AKA is planned on 5/6. However, patient refused AKA.  Discussed with family and the patient, will obtain palliative care consult, patient will need hospice.  Also changed to CODE STATUS to DNR after discussion with the family. Patient has worsening pain in the leg, anticipating rapid deterioration of conditions, accepted into inpatient hospice facility.  Assessment and Plan:  Left leg ischemia with peripheral arterial disease. Patient previously had multiple stents placed in left lower  extremity, she has a worsening ischemia at this time. Angiogram showed diffuse vascular disease, patient is treated with heparin drip.  Planning for AKA.  However, patient has refused AKA.  Due to severe ischemia, patient prognosis without AKA is very poor.  As a result, palliative care will be obtained.   Anticipating rapid deterioration of condition, patient will be transferred to inpatient hospice facility.   Obstructive sleep apnea with nocturnal hypoxia. COPD. Patient does not tolerate the CPAP, continue nocturnal oxygen as needed.  Continue bronchodilator.  Condition stable, no exacerbation.   Acute blood loss anemia. Thrombocytopenia. Condition improving.   Paroxysmal atrial fibrillation ruled out History of stroke. Reviewed previous records, patient never been diagnosed with atrial fibrillation in the past. Continue antiplatelet treatment and Lipitor.   Dementia without behavioral disturbance. Continue home medicines.   Tobacco abuse.        Consultants: Vascular surgery. Procedures performed: None  Disposition: Hospice care Diet recommendation:  Discharge Diet Orders (From admission, onward)     Start     Ordered   03/12/23 0000  Diet - low sodium heart healthy        03/12/23 1224           Cardiac diet DISCHARGE MEDICATION: Allergies as of 03/12/2023       Reactions   Lisinopril Other (See Comments)   Tongue burning sensation        Medication List     STOP taking these medications    albuterol 108 (90 Base) MCG/ACT inhaler Commonly known as: VENTOLIN HFA   alendronate 70 MG tablet Commonly known as: FOSAMAX   aspirin EC 81 MG tablet   busPIRone 5  MG tablet Commonly known as: BUSPAR   Calcium Carb-Cholecalciferol 600-400 MG-UNIT Tabs   clopidogrel 75 MG tablet Commonly known as: PLAVIX   cyanocobalamin 1000 MCG tablet Commonly known as: VITAMIN B12   donepezil 5 MG tablet Commonly known as: ARICEPT   glipiZIDE 10 MG  tablet Commonly known as: GLUCOTROL   metFORMIN 1000 MG tablet Commonly known as: GLUCOPHAGE   metoprolol tartrate 25 MG tablet Commonly known as: LOPRESSOR   montelukast 10 MG tablet Commonly known as: SINGULAIR   multivitamin with minerals Tabs tablet   rosuvastatin 40 MG tablet Commonly known as: CRESTOR   sertraline 50 MG tablet Commonly known as: Zoloft       TAKE these medications    acetaminophen 500 MG tablet Commonly known as: TYLENOL Take 1,000 mg by mouth every 8 (eight) hours as needed for mild pain.   amitriptyline 150 MG tablet Commonly known as: ELAVIL Take 150 mg by mouth at bedtime.   bisacodyl 10 MG suppository Commonly known as: DULCOLAX Place 1 suppository (10 mg total) rectally daily as needed for moderate constipation.   gabapentin 600 MG tablet Commonly known as: NEURONTIN Take 600 mg by mouth 4 (four) times daily. Two tablets twice daily   glycopyrrolate 1 MG tablet Commonly known as: ROBINUL Take 1 tablet (1 mg total) by mouth every 4 (four) hours as needed (excessive secretions).   haloperidol 0.5 MG tablet Commonly known as: HALDOL Take 1 tablet (0.5 mg total) by mouth every 4 (four) hours as needed for agitation (or delirium).   HYDROmorphone 1 MG/ML injection Commonly known as: DILAUDID Inject 1 mL (1 mg total) into the vein every 3 (three) hours as needed for severe pain.   ipratropium-albuterol 0.5-2.5 (3) MG/3ML Soln Commonly known as: DUONEB Take 3 mLs by nebulization every 4 (four) hours as needed.   LORazepam 0.5 MG tablet Commonly known as: ATIVAN Take 1 tablet (0.5 mg total) by mouth every 6 (six) hours as needed for anxiety.   oxyCODONE-acetaminophen 5-325 MG tablet Commonly known as: PERCOCET/ROXICET Take 1 tablet by mouth every 4 (four) hours as needed for moderate pain.   pantoprazole 40 MG tablet Commonly known as: PROTONIX Take 40 mg by mouth daily.   QUEtiapine 25 MG tablet Commonly known as:  SEROQUEL Take 1 tablet (25 mg total) by mouth at bedtime.   senna-docusate 8.6-50 MG tablet Commonly known as: Senokot-S Take 2 tablets by mouth at bedtime as needed for mild constipation.   tiZANidine 4 MG tablet Commonly known as: ZANAFLEX Take 4 mg by mouth 2 (two) times daily.        Discharge Exam: Filed Weights   03/06/23 0904  Weight: 65.3 kg   General exam: Appears calm and comfortable  Respiratory system: Decreased breath sounds. Respiratory effort normal. Cardiovascular system: S1 & S2 heard, RRR. No JVD, murmurs, rubs, gallops or clicks. No pedal edema. Gastrointestinal system: Abdomen is nondistended, soft and nontender. No organomegaly or masses felt. Normal bowel sounds heard. Central nervous system: Alert and oriented x2. No focal neurological deficits. Extremities: Left leg cold. Skin: No rashes, lesions or ulcers Psychiatry: Judgement and insight appear normal. Mood & affect appropriate.    Condition at discharge: poor  The results of significant diagnostics from this hospitalization (including imaging, microbiology, ancillary and laboratory) are listed below for reference.   Imaging Studies: PERIPHERAL VASCULAR CATHETERIZATION  Result Date: 03/07/2023 See surgical note for result.  CT Angio Chest PE W and/or Wo Contrast  Result Date: 03/06/2023 CLINICAL  DATA:  Tachycardia. Concern for pulmonary embolism. Leg pain. EXAM: CT ANGIOGRAPHY CHEST WITH CONTRAST TECHNIQUE: Multidetector CT imaging of the chest was performed using the standard protocol during bolus administration of intravenous contrast. Multiplanar CT image reconstructions and MIPs were obtained to evaluate the vascular anatomy. RADIATION DOSE REDUCTION: This exam was performed according to the departmental dose-optimization program which includes automated exposure control, adjustment of the mA and/or kV according to patient size and/or use of iterative reconstruction technique. CONTRAST:  75mL  OMNIPAQUE IOHEXOL 350 MG/ML SOLN COMPARISON:  None Available. FINDINGS: Cardiovascular: No filling defects within the pulmonary arteries to suggest acute pulmonary embolism. Coronary artery calcification and aortic atherosclerotic calcification. RIGHT internal carotid stent noted. Mediastinum/Nodes: No axillary or supraclavicular adenopathy. No mediastinal or hilar adenopathy. No pericardial fluid. Esophagus normal. Lungs/Pleura: No pulmonary infarction. There is mild interlobular septal thickening. No pneumonia. No pleural fluid. No pneumothorax Upper Abdomen: Limited view of the liver, kidneys, pancreas are unremarkable. Normal adrenal glands. Musculoskeletal: Review of the MIP images confirms the above findings. IMPRESSION: 1. No evidence acute pulmonary embolism. 2. Mild interstitial edema. 3.  Aortic Atherosclerosis (ICD10-I70.0). Electronically Signed   By: Genevive Bi M.D.   On: 03/06/2023 14:12   US Venous Img Lower Unilateral Left (DVT)  Result Date: 03/06/2023 CLINICAL DATA:  Left lower extremity pain. History of smoking. Evaluate for DVT. EXAM: LEFT LOWER EXTREMITY VENOUS DOPPLER ULTRASOUND TECHNIQUE: Gray-scale sonography with graded compression, as well as color Doppler and duplex ultrasound were performed to evaluate the lower extremity deep venous systems from the level of the common femoral vein and including the common femoral, femoral, profunda femoral, popliteal and calf veins including the posterior tibial, peroneal and gastrocnemius veins when visible. The superficial great saphenous vein was also interrogated. Spectral Doppler was utilized to evaluate flow at rest and with distal augmentation maneuvers in the common femoral, femoral and popliteal veins. COMPARISON:  None Available. FINDINGS: Contralateral Common Femoral Vein: Respiratory phasicity is normal and symmetric with the symptomatic side. No evidence of thrombus. Normal compressibility. Common Femoral Vein: No evidence of  thrombus. Normal compressibility, respiratory phasicity and response to augmentation. Saphenofemoral Junction: No evidence of thrombus. Normal compressibility and flow on color Doppler imaging. Profunda Femoral Vein: No evidence of thrombus. Normal compressibility and flow on color Doppler imaging. Femoral Vein: No evidence of thrombus. Normal compressibility, respiratory phasicity and response to augmentation. Popliteal Vein: No evidence of thrombus. Normal compressibility, respiratory phasicity and response to augmentation. Calf Veins: No evidence of thrombus. Normal compressibility and flow on color Doppler imaging. Superficial Great Saphenous Vein: No evidence of thrombus. Normal compressibility. Other Findings: Note is made of a mildly prominent though non pathologically enlarged left lymph node which is not enlarged by size criteria measuring 0.5 cm in greatest short axis diameter maintains hilum (image 35). Note is made arterial stent within the left popliteal artery). IMPRESSION: No evidence of DVT within the left lower extremity Electronically Signed   By: Simonne Come M.D.   On: 03/06/2023 11:58   DG Chest 2 View  Result Date: 03/05/2023 CLINICAL DATA:  Shortness of breath. EXAM: CHEST - 2 VIEW COMPARISON:  Chest radiographs 01/08/2023, 04/30/2022 FINDINGS: Cardiac silhouette and mediastinal contours within normal limits. Moderate calcification is again seen within aortic arch. Stenting of the right common carotid artery is again noted. There is interstitial thickening of the inferior medial right lung which appears mildly improved from 01/08/2023 but again new from 04/30/2022. The left lung is clear. No pleural effusion or pneumothorax.  Mild-to-moderate multilevel degenerative disc changes of the thoracic spine. IMPRESSION: Interstitial thickening of the inferior medial right lung appears mildly improved from 01/08/2023 but again new from 04/30/2022. This may represent improving pneumonitis or chronic  scarring, such as the sequela of prior infection. Electronically Signed   By: Neita Garnet M.D.   On: 03/05/2023 18:48    Microbiology: Results for orders placed or performed during the hospital encounter of 01/08/23  Resp panel by RT-PCR (RSV, Flu A&B, Covid) Urine, Clean Catch     Status: None   Collection Time: 01/08/23  8:03 PM   Specimen: Urine, Clean Catch; Nasal Swab  Result Value Ref Range Status   SARS Coronavirus 2 by RT PCR NEGATIVE NEGATIVE Final    Comment: (NOTE) SARS-CoV-2 target nucleic acids are NOT DETECTED.  The SARS-CoV-2 RNA is generally detectable in upper respiratory specimens during the acute phase of infection. The lowest concentration of SARS-CoV-2 viral copies this assay can detect is 138 copies/mL. A negative result does not preclude SARS-Cov-2 infection and should not be used as the sole basis for treatment or other patient management decisions. A negative result may occur with  improper specimen collection/handling, submission of specimen other than nasopharyngeal swab, presence of viral mutation(s) within the areas targeted by this assay, and inadequate number of viral copies(<138 copies/mL). A negative result must be combined with clinical observations, patient history, and epidemiological information. The expected result is Negative.  Fact Sheet for Patients:  BloggerCourse.com  Fact Sheet for Healthcare Providers:  SeriousBroker.it  This test is no t yet approved or cleared by the [place] FDA and  has been authorized for detection and/or diagnosis of SARS-CoV-2 by FDA under an Emergency Use Authorization (EUA). This EUA will remain  in effect (meaning this test can be used) for the duration of the COVID-19 declaration under Section 564(b)(1) of the Act, 21 U.S.C.section 360bbb-3(b)(1), unless the authorization is terminated  or revoked sooner.       Influenza A by PCR NEGATIVE NEGATIVE  Final   Influenza B by PCR NEGATIVE NEGATIVE Final    Comment: (NOTE) The Xpert Xpress SARS-CoV-2/FLU/RSV plus assay is intended as an aid in the diagnosis of influenza from Nasopharyngeal swab specimens and should not be used as a sole basis for treatment. Nasal washings and aspirates are unacceptable for Xpert Xpress SARS-CoV-2/FLU/RSV testing.  Fact Sheet for Patients: BloggerCourse.com  Fact Sheet for Healthcare Providers: SeriousBroker.it  This test is not yet approved or cleared by the [place] FDA and has been authorized for detection and/or diagnosis of SARS-CoV-2 by FDA under an Emergency Use Authorization (EUA). This EUA will remain in effect (meaning this test can be used) for the duration of the COVID-19 declaration under Section 564(b)(1) of the Act, 21 U.S.C. section 360bbb-3(b)(1), unless the authorization is terminated or revoked.     Resp Syncytial Virus by PCR NEGATIVE NEGATIVE Final    Comment: (NOTE) Fact Sheet for Patients: BloggerCourse.com  Fact Sheet for Healthcare Providers: SeriousBroker.it  This test is not yet approved or cleared by the [place] FDA and has been authorized for detection and/or diagnosis of SARS-CoV-2 by FDA under an Emergency Use Authorization (EUA). This EUA will remain in effect (meaning this test can be used) for the duration of the COVID-19 declaration under Section 564(b)(1) of the Act, 21 U.S.C. section 360bbb-3(b)(1), unless the authorization is terminated or revoked.  Performed at [facility], [address] [place], [place] [address]     Labs: CBC:  Recent Labs  Lab 03/06/23 0950 03/08/23 0438 03/08/23 1442 03/09/23 0514 03/10/23 0504 03/11/23 0701  WBC 11.1* 7.4  --  7.9 8.2 7.6  NEUTROABS 8.6*  --   --   --   --   --   HGB 12.6 8.5* 9.4* 9.5* 9.4* 8.7*  HCT 38.2 26.3*  --  29.4* 29.0*  26.4*  MCV 86.0 87.7  --  88.3 89.5 87.7  PLT 188 127*  --  145* 162 154   Basic Metabolic Panel: Recent Labs  Lab 03/05/23 1803 03/06/23 0950 03/09/23 0514 03/10/23 0504  NA 138 134* 132* 135  K 3.7 3.8 3.7 3.8  CL 101 98 103 104  CO2 23 23 21* 21*  GLUCOSE 191* 181* 155* 149*  BUN 12 12 15 15   CREATININE 0.84 0.73 0.74 0.75  CALCIUM 9.4 9.0 8.3* 8.5*  MG  --   --  1.9  --    Liver Function Tests: Recent Labs  Lab 03/06/23 0950  AST 49*  ALT 17  ALKPHOS 88  BILITOT 1.2  PROT 7.4  ALBUMIN 3.9   CBG: Recent Labs  Lab 03/11/23 0820 03/11/23 1134 03/11/23 1801 03/11/23 2131 03/12/23 0905  GLUCAP 127* 162* 130* 143* 134*    Discharge time spent: greater than 30 minutes.  Signed: Marrion Coy, MD [facility] 03/12/2023

## 2023-04-06 DEATH — deceased
